# Patient Record
Sex: Male | Born: 1955 | Race: White | Hispanic: No | Marital: Married | Smoking: Never smoker
Health system: Southern US, Community
[De-identification: ages and names within clinical notes are randomized; demographics above are authoritative.]

## PROBLEM LIST (undated history)

## (undated) DIAGNOSIS — R109 Unspecified abdominal pain: Secondary | ICD-10-CM

## (undated) DIAGNOSIS — M25569 Pain in unspecified knee: Secondary | ICD-10-CM

## (undated) DIAGNOSIS — E119 Type 2 diabetes mellitus without complications: Secondary | ICD-10-CM

## (undated) DIAGNOSIS — R079 Chest pain, unspecified: Secondary | ICD-10-CM

## (undated) DIAGNOSIS — Z87448 Personal history of other diseases of urinary system: Secondary | ICD-10-CM

## (undated) DIAGNOSIS — Z87442 Personal history of urinary calculi: Secondary | ICD-10-CM

## (undated) DIAGNOSIS — I10 Essential (primary) hypertension: Secondary | ICD-10-CM

## (undated) DIAGNOSIS — E785 Hyperlipidemia, unspecified: Secondary | ICD-10-CM

## (undated) DIAGNOSIS — M1990 Unspecified osteoarthritis, unspecified site: Secondary | ICD-10-CM

## (undated) DIAGNOSIS — Z86010 Personal history of colonic polyps: Secondary | ICD-10-CM

## (undated) DIAGNOSIS — F329 Major depressive disorder, single episode, unspecified: Secondary | ICD-10-CM

## (undated) DIAGNOSIS — E039 Hypothyroidism, unspecified: Secondary | ICD-10-CM

## (undated) HISTORY — DX: Pain in unspecified knee: M25.569

## (undated) HISTORY — DX: Chest pain, unspecified: R07.9

## (undated) HISTORY — DX: Hyperlipidemia, unspecified: E78.5

## (undated) HISTORY — DX: Personal history of other diseases of urinary system: Z87.448

## (undated) HISTORY — DX: Unspecified osteoarthritis, unspecified site: M19.90

## (undated) HISTORY — PX: OTHER SURGICAL HISTORY: SHX169

## (undated) HISTORY — PX: TONSILLECTOMY: SUR1361

## (undated) HISTORY — PX: ANKLE SURGERY: SHX546

## (undated) HISTORY — DX: Essential (primary) hypertension: I10

## (undated) HISTORY — DX: Personal history of colonic polyps: Z86.010

## (undated) HISTORY — DX: Hypothyroidism, unspecified: E03.9

## (undated) HISTORY — DX: Major depressive disorder, single episode, unspecified: F32.9

## (undated) HISTORY — DX: Personal history of urinary calculi: Z87.442

## (undated) HISTORY — DX: Type 2 diabetes mellitus without complications: E11.9

## (undated) HISTORY — DX: Unspecified abdominal pain: R10.9

## (undated) HISTORY — PX: JOINT REPLACEMENT: SHX530

---

## 2000-08-21 ENCOUNTER — Emergency Department (HOSPITAL_COMMUNITY): Admission: EM | Admit: 2000-08-21 | Discharge: 2000-08-21 | Payer: Self-pay | Admitting: Emergency Medicine

## 2000-08-22 ENCOUNTER — Emergency Department (HOSPITAL_COMMUNITY): Admission: RE | Admit: 2000-08-22 | Discharge: 2000-08-22 | Payer: Self-pay | Admitting: Emergency Medicine

## 2000-08-22 ENCOUNTER — Encounter: Payer: Self-pay | Admitting: Emergency Medicine

## 2000-09-12 ENCOUNTER — Encounter: Payer: Self-pay | Admitting: Urology

## 2000-09-12 ENCOUNTER — Ambulatory Visit (HOSPITAL_COMMUNITY): Admission: RE | Admit: 2000-09-12 | Discharge: 2000-09-12 | Payer: Self-pay | Admitting: Urology

## 2000-11-01 ENCOUNTER — Encounter: Payer: Self-pay | Admitting: Family Medicine

## 2000-11-01 ENCOUNTER — Ambulatory Visit (HOSPITAL_COMMUNITY): Admission: RE | Admit: 2000-11-01 | Discharge: 2000-11-01 | Payer: Self-pay | Admitting: Family Medicine

## 2000-11-18 ENCOUNTER — Ambulatory Visit (HOSPITAL_COMMUNITY): Admission: RE | Admit: 2000-11-18 | Discharge: 2000-11-18 | Payer: Self-pay | Admitting: Urology

## 2000-11-18 ENCOUNTER — Encounter: Payer: Self-pay | Admitting: Urology

## 2001-01-14 ENCOUNTER — Ambulatory Visit (HOSPITAL_COMMUNITY): Admission: RE | Admit: 2001-01-14 | Discharge: 2001-01-14 | Payer: Self-pay | Admitting: Urology

## 2001-01-14 ENCOUNTER — Encounter: Payer: Self-pay | Admitting: Urology

## 2001-11-17 ENCOUNTER — Encounter (INDEPENDENT_AMBULATORY_CARE_PROVIDER_SITE_OTHER): Payer: Self-pay

## 2001-11-17 ENCOUNTER — Ambulatory Visit (HOSPITAL_COMMUNITY): Admission: RE | Admit: 2001-11-17 | Discharge: 2001-11-17 | Payer: Self-pay | Admitting: Gastroenterology

## 2002-12-29 ENCOUNTER — Ambulatory Visit (HOSPITAL_COMMUNITY): Admission: RE | Admit: 2002-12-29 | Discharge: 2002-12-29 | Payer: Self-pay | Admitting: Family Medicine

## 2003-07-06 ENCOUNTER — Encounter: Admission: RE | Admit: 2003-07-06 | Discharge: 2003-10-04 | Payer: Self-pay | Admitting: Family Medicine

## 2004-01-19 ENCOUNTER — Encounter: Admission: RE | Admit: 2004-01-19 | Discharge: 2004-04-18 | Payer: Self-pay | Admitting: Family Medicine

## 2005-01-10 ENCOUNTER — Encounter: Admission: RE | Admit: 2005-01-10 | Discharge: 2005-01-25 | Payer: Self-pay | Admitting: Family Medicine

## 2007-05-27 ENCOUNTER — Encounter: Admission: RE | Admit: 2007-05-27 | Discharge: 2007-05-30 | Payer: Self-pay | Admitting: Family Medicine

## 2008-05-24 ENCOUNTER — Ambulatory Visit: Payer: Self-pay | Admitting: Internal Medicine

## 2008-05-26 ENCOUNTER — Ambulatory Visit: Payer: Self-pay | Admitting: Internal Medicine

## 2008-05-26 DIAGNOSIS — Z860100 Personal history of colon polyps, unspecified: Secondary | ICD-10-CM

## 2008-05-26 DIAGNOSIS — F329 Major depressive disorder, single episode, unspecified: Secondary | ICD-10-CM

## 2008-05-26 DIAGNOSIS — F3289 Other specified depressive episodes: Secondary | ICD-10-CM

## 2008-05-26 DIAGNOSIS — Z87442 Personal history of urinary calculi: Secondary | ICD-10-CM | POA: Insufficient documentation

## 2008-05-26 DIAGNOSIS — E785 Hyperlipidemia, unspecified: Secondary | ICD-10-CM | POA: Insufficient documentation

## 2008-05-26 DIAGNOSIS — I10 Essential (primary) hypertension: Secondary | ICD-10-CM

## 2008-05-26 DIAGNOSIS — E119 Type 2 diabetes mellitus without complications: Secondary | ICD-10-CM

## 2008-05-26 DIAGNOSIS — E039 Hypothyroidism, unspecified: Secondary | ICD-10-CM

## 2008-05-26 DIAGNOSIS — Z87448 Personal history of other diseases of urinary system: Secondary | ICD-10-CM

## 2008-05-26 DIAGNOSIS — E1149 Type 2 diabetes mellitus with other diabetic neurological complication: Secondary | ICD-10-CM | POA: Insufficient documentation

## 2008-05-26 DIAGNOSIS — F325 Major depressive disorder, single episode, in full remission: Secondary | ICD-10-CM | POA: Insufficient documentation

## 2008-05-26 DIAGNOSIS — F32A Depression, unspecified: Secondary | ICD-10-CM | POA: Insufficient documentation

## 2008-05-26 HISTORY — DX: Personal history of other diseases of urinary system: Z87.448

## 2008-05-26 HISTORY — DX: Personal history of colon polyps, unspecified: Z86.0100

## 2008-05-26 HISTORY — DX: Type 2 diabetes mellitus without complications: E11.9

## 2008-05-26 HISTORY — DX: Hypothyroidism, unspecified: E03.9

## 2008-05-26 HISTORY — DX: Hyperlipidemia, unspecified: E78.5

## 2008-05-26 HISTORY — DX: Personal history of colonic polyps: Z86.010

## 2008-05-26 HISTORY — DX: Personal history of urinary calculi: Z87.442

## 2008-05-26 HISTORY — DX: Essential (primary) hypertension: I10

## 2008-05-26 HISTORY — DX: Other specified depressive episodes: F32.89

## 2008-05-26 HISTORY — DX: Major depressive disorder, single episode, unspecified: F32.9

## 2008-05-26 LAB — CONVERTED CEMR LAB
ALT: 32 units/L (ref 0–53)
AST: 26 units/L (ref 0–37)
Albumin: 3.6 g/dL (ref 3.5–5.2)
Alkaline Phosphatase: 43 units/L (ref 39–117)
BUN: 22 mg/dL (ref 6–23)
Basophils Absolute: 0 10*3/uL (ref 0.0–0.1)
Bilirubin Urine: NEGATIVE
CO2: 35 meq/L — ABNORMAL HIGH (ref 19–32)
Calcium: 9.3 mg/dL (ref 8.4–10.5)
Chloride: 104 meq/L (ref 96–112)
Creatinine, Ser: 0.9 mg/dL (ref 0.4–1.5)
Eosinophils Relative: 4.8 % (ref 0.0–5.0)
Glucose, Bld: 149 mg/dL — ABNORMAL HIGH (ref 70–99)
HCT: 43.9 % (ref 39.0–52.0)
HDL: 20 mg/dL — ABNORMAL LOW (ref >39.0)
Hemoglobin: 15 g/dL (ref 13.0–17.0)
Hgb A1c MFr Bld: 6.7 % — ABNORMAL HIGH (ref 4.6–6.0)
LDL Cholesterol: 139 mg/dL — ABNORMAL HIGH (ref 0–99)
MCHC: 34.1 g/dL (ref 30.0–36.0)
MCV: 88.9 fL (ref 78.0–100.0)
Monocytes Absolute: 0.7 10*3/uL (ref 0.1–1.0)
Monocytes Relative: 7.7 % (ref 3.0–12.0)
Platelets: 309 10*3/uL (ref 150–400)
Potassium: 3.7 meq/L (ref 3.5–5.1)
RBC: 4.94 M/uL (ref 4.22–5.81)
Specific Gravity, Urine: 1.02 (ref 1.000–1.03)
TSH: 1.36 microintl units/mL (ref 0.35–5.50)
Total Bilirubin: 0.7 mg/dL (ref 0.3–1.2)
Total CHOL/HDL Ratio: 9.9
Total Protein: 6.9 g/dL (ref 6.0–8.3)
Triglycerides: 193 mg/dL — ABNORMAL HIGH (ref 0–149)
Urine Glucose: NEGATIVE mg/dL
Urobilinogen, UA: 0.2 (ref 0.0–1.0)
WBC: 8.8 10*3/uL (ref 4.5–10.5)

## 2008-05-27 ENCOUNTER — Encounter: Payer: Self-pay | Admitting: Internal Medicine

## 2008-07-01 ENCOUNTER — Ambulatory Visit: Payer: Self-pay | Admitting: Internal Medicine

## 2008-07-01 DIAGNOSIS — M25569 Pain in unspecified knee: Secondary | ICD-10-CM

## 2008-07-01 HISTORY — DX: Pain in unspecified knee: M25.569

## 2008-07-19 ENCOUNTER — Telehealth: Payer: Self-pay | Admitting: Internal Medicine

## 2008-07-20 ENCOUNTER — Ambulatory Visit: Payer: Self-pay | Admitting: Internal Medicine

## 2008-07-20 DIAGNOSIS — R109 Unspecified abdominal pain: Secondary | ICD-10-CM

## 2008-07-20 HISTORY — DX: Unspecified abdominal pain: R10.9

## 2008-07-20 LAB — CONVERTED CEMR LAB
Bilirubin Urine: NEGATIVE
Crystals: NEGATIVE
Hemoglobin, Urine: NEGATIVE
Ketones, ur: NEGATIVE mg/dL
Mucus, UA: NEGATIVE
Specific Gravity, Urine: >=1.03 (ref 1.000–1.03)
Total Protein, Urine: NEGATIVE mg/dL
Urobilinogen, UA: 0.2 (ref 0.0–1.0)

## 2008-07-21 ENCOUNTER — Encounter: Payer: Self-pay | Admitting: Internal Medicine

## 2008-09-23 ENCOUNTER — Encounter: Payer: Self-pay | Admitting: Internal Medicine

## 2009-01-21 ENCOUNTER — Telehealth (INDEPENDENT_AMBULATORY_CARE_PROVIDER_SITE_OTHER): Payer: Self-pay | Admitting: *Deleted

## 2009-01-24 ENCOUNTER — Ambulatory Visit: Payer: Self-pay | Admitting: Internal Medicine

## 2009-02-17 ENCOUNTER — Ambulatory Visit: Payer: Self-pay | Admitting: Internal Medicine

## 2009-02-17 LAB — CONVERTED CEMR LAB
AST: 28 units/L (ref 0–37)
Albumin: 3.9 g/dL (ref 3.5–5.2)
Alkaline Phosphatase: 49 units/L (ref 39–117)
Basophils Absolute: 0 10*3/uL (ref 0.0–0.1)
Basophils Relative: 0.2 % (ref 0.0–3.0)
Bilirubin, Direct: 0.2 mg/dL (ref 0.0–0.3)
Calcium: 9.3 mg/dL (ref 8.4–10.5)
Chloride: 102 meq/L (ref 96–112)
Cholesterol: 172 mg/dL (ref 0–200)
Creatinine,U: 231.1 mg/dL
Eosinophils Relative: 7 % — ABNORMAL HIGH (ref 0.0–5.0)
GFR calc non Af Amer: 93 mL/min
Glucose, Bld: 142 mg/dL — ABNORMAL HIGH (ref 70–99)
HDL: 29.4 mg/dL — ABNORMAL LOW (ref >39.0)
Hemoglobin: 14.8 g/dL (ref 13.0–17.0)
Hgb A1c MFr Bld: 6.8 % — ABNORMAL HIGH (ref 4.6–6.0)
Ketones, ur: NEGATIVE mg/dL
LDL Cholesterol: 118 mg/dL — ABNORMAL HIGH (ref 0–99)
Microalb, Ur: 0.7 mg/dL (ref 0.0–1.9)
Monocytes Relative: 6.7 % (ref 3.0–12.0)
Neutro Abs: 5.7 10*3/uL (ref 1.4–7.7)
Nitrite: NEGATIVE
PSA: 0.1 ng/mL (ref 0.10–4.00)
Potassium: 3.8 meq/L (ref 3.5–5.1)
Sodium: 143 meq/L (ref 135–145)
Total Bilirubin: 0.9 mg/dL (ref 0.3–1.2)
Total Protein, Urine: NEGATIVE mg/dL
Urine Glucose: NEGATIVE mg/dL

## 2009-02-22 ENCOUNTER — Ambulatory Visit: Payer: Self-pay | Admitting: Internal Medicine

## 2009-09-07 ENCOUNTER — Ambulatory Visit: Payer: Self-pay | Admitting: Internal Medicine

## 2009-09-07 LAB — CONVERTED CEMR LAB
BUN: 23 mg/dL (ref 6–23)
CO2: 32 meq/L (ref 19–32)
Calcium: 9.1 mg/dL (ref 8.4–10.5)
Creatinine, Ser: 0.8 mg/dL (ref 0.4–1.5)
HDL: 28.7 mg/dL — ABNORMAL LOW (ref >39.00)
Hgb A1c MFr Bld: 6.5 % (ref 4.6–6.5)
Sodium: 142 meq/L (ref 135–145)
Triglycerides: 164 mg/dL — ABNORMAL HIGH (ref 0.0–149.0)

## 2009-12-28 ENCOUNTER — Inpatient Hospital Stay (HOSPITAL_COMMUNITY): Admission: AD | Admit: 2009-12-28 | Discharge: 2010-01-01 | Payer: Self-pay | Admitting: Orthopedic Surgery

## 2010-01-16 ENCOUNTER — Ambulatory Visit: Payer: Self-pay | Admitting: Internal Medicine

## 2010-01-16 ENCOUNTER — Encounter (INDEPENDENT_AMBULATORY_CARE_PROVIDER_SITE_OTHER): Payer: Self-pay | Admitting: *Deleted

## 2010-01-19 ENCOUNTER — Ambulatory Visit: Payer: Self-pay | Admitting: Internal Medicine

## 2010-01-19 LAB — CONVERTED CEMR LAB

## 2010-04-03 ENCOUNTER — Encounter: Payer: Self-pay | Admitting: Internal Medicine

## 2010-04-27 ENCOUNTER — Ambulatory Visit: Payer: Self-pay | Admitting: Internal Medicine

## 2010-04-27 LAB — CONVERTED CEMR LAB
ALT: 22 units/L (ref 0–53)
Basophils Absolute: 0.1 10*3/uL (ref 0.0–0.1)
Basophils Relative: 0.7 % (ref 0.0–3.0)
CO2: 32 meq/L (ref 19–32)
Chloride: 103 meq/L (ref 96–112)
Cholesterol: 142 mg/dL (ref 0–200)
Creatinine, Ser: 0.8 mg/dL (ref 0.4–1.5)
Eosinophils Absolute: 0.3 10*3/uL (ref 0.0–0.7)
Eosinophils Relative: 3.9 % (ref 0.0–5.0)
Glucose, Bld: 135 mg/dL — ABNORMAL HIGH (ref 70–99)
HCT: 41.4 % (ref 39.0–52.0)
MCHC: 34.2 g/dL (ref 30.0–36.0)
Monocytes Absolute: 0.7 10*3/uL (ref 0.1–1.0)
Neutrophils Relative %: 61.4 % (ref 43.0–77.0)
Platelets: 235 10*3/uL (ref 150.0–400.0)
RDW: 14.1 % (ref 11.5–14.6)
Sodium: 143 meq/L (ref 135–145)
Total Bilirubin: 0.4 mg/dL (ref 0.3–1.2)
VLDL: 25.4 mg/dL (ref 0.0–40.0)

## 2010-11-20 ENCOUNTER — Ambulatory Visit: Payer: Self-pay | Admitting: Internal Medicine

## 2010-11-20 LAB — CONVERTED CEMR LAB
BUN: 26 mg/dL — ABNORMAL HIGH (ref 6–23)
CO2: 30 meq/L (ref 19–32)
Chloride: 102 meq/L (ref 96–112)
HDL: 30.2 mg/dL — ABNORMAL LOW (ref >39.00)
LDL Cholesterol: 79 mg/dL (ref 0–99)
Total CHOL/HDL Ratio: 5
VLDL: 30.6 mg/dL (ref 0.0–40.0)

## 2010-11-23 ENCOUNTER — Ambulatory Visit: Payer: Self-pay | Admitting: Internal Medicine

## 2010-12-13 ENCOUNTER — Encounter: Payer: Self-pay | Admitting: Internal Medicine

## 2010-12-13 LAB — HM COLONOSCOPY: HM Colonoscopy: NORMAL

## 2011-01-23 NOTE — Miscellaneous (Signed)
Summary: Orders Update  Clinical Lists Changes  Problems: Added new problem of COUMADIN THERAPY (ICD-V58.61) Orders: Added new Referral order of Misc. Referral (Misc. Ref) - Signed

## 2011-01-23 NOTE — Medication Information (Signed)
Summary: new to coumadin  Anticoagulant Therapy  Managed by: Charolotte Eke, PharmD Referring MD: Hinda Glatter MD: Graciela Husbands MD, Viviann Spare Indication 1: Prophylaxis DVT (high risk surgery) INR RANGE 2-3  Dietary changes: no    Health status changes: no    Bleeding/hemorrhagic complications: no    Recent/future hospitalizations: no    Any changes in medication regimen? no    Recent/future dental: no  Any missed doses?: no       Is patient compliant with meds? yes       Current Medications (verified): 1)  Allopurinol 300 Mg  Tabs (Allopurinol) .Marland Kitchen.. 1 By Mouth Once Daily 2)  Levothyroxine Sodium 25 Mcg  Tabs (Levothyroxine Sodium) .Marland Kitchen.. 1po Once Daily 3)  Lisinopril 20 Mg Tabs (Lisinopril) .Marland Kitchen.. 1 By Mouth Once Daily 4)  Effexor Xr 75 Mg  Cp24 (Venlafaxine Hcl) .Marland Kitchen.. 1 By Mouth Once Daily 5)  Actoplus Met 15-500 Mg Tabs (Pioglitazone Hcl-Metformin Hcl) .Marland Kitchen.. 1po Once Daily 6)  Klor-Con M20 20 Meq  Tbcr (Potassium Chloride Crys Cr) .... 2 Tabs By Mouth Once Daily 7)  Dicyclomine Hcl 20 Mg  Tabs (Dicyclomine Hcl) .Marland Kitchen.. 1 By Mouth Three Times A Day Prn 8)  Adult Aspirin Ec Low Strength 81 Mg  Tbec (Aspirin) .Marland Kitchen.. 1po Once Daily 9)  Simvastatin 80 Mg Tabs (Simvastatin) .Marland Kitchen.. 1po Once Daily 10)  Atenolol 50 Mg  Tabs (Atenolol) .Marland Kitchen.. 1 By Mouth Once Daily 11)  Chlorthalidone 25 Mg  Tabs (Chlorthalidone) .Marland Kitchen.. 1 By Mouth Once Daily 12)  Tramadol Hcl 50 Mg Tabs (Tramadol Hcl) .Marland Kitchen.. 1 - 2 By Mouth Q 6 Hrs As Needed 13)  Indomethacin 50 Mg Caps (Indomethacin) .Marland Kitchen.. 1 Tab By Mouth Three Times A Day 14)  Doxycycline Hyclate 100 Mg Caps (Doxycycline Hyclate) .Marland Kitchen.. 1 By Mouth Two Times A Day  Allergies (verified): 1)  ! Pcn 2)  ! Sulfa  Anticoagulation Management History:      The patient is taking warfarin and comes in today for a routine follow up visit.  Positive risk factors for bleeding include presence of serious comorbidities.  Negative risk factors for bleeding include an age less than 34  years old.  The bleeding index is 'intermediate risk'.  Positive CHADS2 values include History of HTN and History of Diabetes.  Negative CHADS2 values include Age > 22 years old.  His last INR was 2.0 ratio.  Anticoagulation responsible provider: Graciela Husbands MD, Viviann Spare.  Cuvette Lot#: [id number].  Exp: 03/2011.    Anticoagulation Management Assessment/Plan:      Results were reviewed/authorized by Charolotte Eke, PharmD.  He was notified by Charolotte Eke, PharmD.         Current Anticoagulation Instructions: Continue same dosage: alternate 1 tab with 0.5 tab.  Last dose on 01/21/10.  No follow-up necessary.  Appended Document: new to coumadin INR was 2.0.

## 2011-01-23 NOTE — Assessment & Plan Note (Signed)
Summary: cpx/bcbs/#/cd   Vital Signs:  Patient profile:   55 year old male Height:      73 inches Weight:      324 pounds BMI:     42.90 O2 Sat:      97 % on Room air Temp:     98.4 degrees F oral Pulse rate:   46 / minute BP sitting:   100 / 68  (left arm) Cuff size:   large  Vitals Entered ByZella Ball Ewing (Apr 27, 2010 8:45 AM)  O2 Flow:  Room air  Preventive Care Screening  Last Flu Shot:    Date:  09/23/2009    Results:  given   CC: Adult Physical/RE   CC:  Adult Physical/RE.  History of Present Illness: overall doing well, no complaints,  Pt denies CP, sob, doe, wheezing, orthopnea, pnd, worsening LE edema, palps, dizziness or syncope   Pt denies new neuro symptoms such as headache, facial or extremity weakness   Pt denies polydipsia, polyuria, or low sugar symptoms such as shakiness improved with eating.  Overall good compliance with meds, trying to follow low chol, DM diet, wt stable, little excercise however  CBG's in low 100's.    Problems Prior to Update: 1)  Preventive Health Care  (ICD-V70.0) 2)  Abdominal Pain Other Specified Site  (ICD-789.09) 3)  Knee Pain, Right, Acute  (ICD-719.46) 4)  Nephrolithiasis, Hx of  (ICD-V13.01) 5)  Preventive Health Care  (ICD-V70.0) 6)  Uti's, Hx of  (ICD-V13.00) 7)  Renal Calculus, Hx of  (ICD-V13.01) 8)  Depression  (ICD-311) 9)  Hyperlipidemia  (ICD-272.4) 10)  Hypothyroidism  (ICD-244.9) 11)  Hypertension  (ICD-401.9) 12)  Colonic Polyps, Hx of  (ICD-V12.72) 13)  Diabetes Mellitus, Type II  (ICD-250.00)  Medications Prior to Update: 1)  Allopurinol 300 Mg  Tabs (Allopurinol) .Marland Kitchen.. 1 By Mouth Once Daily 2)  Levothyroxine Sodium 25 Mcg  Tabs (Levothyroxine Sodium) .Marland Kitchen.. 1po Once Daily 3)  Lisinopril 20 Mg Tabs (Lisinopril) .Marland Kitchen.. 1 By Mouth Once Daily 4)  Effexor Xr 75 Mg  Cp24 (Venlafaxine Hcl) .Marland Kitchen.. 1 By Mouth Once Daily 5)  Actoplus Met 15-500 Mg Tabs (Pioglitazone Hcl-Metformin Hcl) .Marland Kitchen.. 1po Once Daily 6)  Klor-Con  M20 20 Meq  Tbcr (Potassium Chloride Crys Cr) .... 2 Tabs By Mouth Once Daily 7)  Dicyclomine Hcl 20 Mg  Tabs (Dicyclomine Hcl) .Marland Kitchen.. 1 By Mouth Three Times A Day Prn 8)  Adult Aspirin Ec Low Strength 81 Mg  Tbec (Aspirin) .Marland Kitchen.. 1po Once Daily 9)  Simvastatin 80 Mg Tabs (Simvastatin) .Marland Kitchen.. 1po Once Daily 10)  Atenolol 50 Mg  Tabs (Atenolol) .Marland Kitchen.. 1 By Mouth Once Daily 11)  Chlorthalidone 25 Mg  Tabs (Chlorthalidone) .Marland Kitchen.. 1 By Mouth Once Daily 12)  Tramadol Hcl 50 Mg Tabs (Tramadol Hcl) .Marland Kitchen.. 1 - 2 By Mouth Q 6 Hrs As Needed 13)  Indomethacin 50 Mg Caps (Indomethacin) .Marland Kitchen.. 1 Tab By Mouth Three Times A Day 14)  Doxycycline Hyclate 100 Mg Caps (Doxycycline Hyclate) .Marland Kitchen.. 1 By Mouth Two Times A Day  Current Medications (verified): 1)  Allopurinol 300 Mg  Tabs (Allopurinol) .Marland Kitchen.. 1 By Mouth Once Daily 2)  Levothyroxine Sodium 25 Mcg  Tabs (Levothyroxine Sodium) .Marland Kitchen.. 1po Once Daily 3)  Lisinopril 20 Mg Tabs (Lisinopril) .Marland Kitchen.. 1 By Mouth Once Daily 4)  Effexor Xr 75 Mg  Cp24 (Venlafaxine Hcl) .Marland Kitchen.. 1 By Mouth Once Daily 5)  Actoplus Met 15-500 Mg Tabs (Pioglitazone Hcl-Metformin Hcl) .Marland Kitchen.. 1po  Once Daily 6)  Klor-Con M20 20 Meq  Tbcr (Potassium Chloride Crys Cr) .... 2 Tabs By Mouth Once Daily 7)  Dicyclomine Hcl 20 Mg  Tabs (Dicyclomine Hcl) .Marland Kitchen.. 1 By Mouth Three Times A Day As Needed 8)  Adult Aspirin Ec Low Strength 81 Mg  Tbec (Aspirin) .Marland Kitchen.. 1po Once Daily 9)  Simvastatin 80 Mg Tabs (Simvastatin) .Marland Kitchen.. 1po Once Daily 10)  Atenolol 50 Mg  Tabs (Atenolol) .Marland Kitchen.. 1 By Mouth Once Daily 11)  Chlorthalidone 25 Mg  Tabs (Chlorthalidone) .Marland Kitchen.. 1 By Mouth Once Daily 12)  Indomethacin 50 Mg Caps (Indomethacin) .Marland Kitchen.. 1 Tab By Mouth Three Times A Day  Allergies (verified): 1)  ! Pcn 2)  ! 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 Diabetes mellitus, type II Colonic polyps, hx of - 1997 - adenomatous - dr Randa Evens Hypertension Hypothyroidism Hyperlipidemia Depression Nephrolithiasis, hx of add.prob Right knee pain  Past Surgical History: Tonsillectomy s/p right knee arthroplasty complicated by patellar tendon rupture ja n 2011 - dr Darrelyn Hillock  Review of Systems  The patient denies anorexia, fever, vision loss, decreased hearing, hoarseness, chest pain, syncope, dyspnea on exertion, peripheral edema, prolonged cough, headaches, hemoptysis, abdominal pain, melena, hematochezia, severe indigestion/heartburn, hematuria, muscle weakness, suspicious skin lesions, transient blindness, difficulty walking, depression, unusual weight change, abnormal bleeding, enlarged lymph nodes, and angioedema.         all otherwise negative per pt -    Physical Exam  General:  alert and overweight-appearing.   Head:  normocephalic and atraumatic.   Eyes:  vision grossly intact, pupils equal, and pupils round.   Ears:  R ear normal and L ear normal.   Nose:  no external deformity and no nasal discharge.   Mouth:  no gingival abnormalities and pharynx pink and moist.   Neck:  supple and no masses.   Lungs:  normal respiratory effort and normal breath sounds.   Heart:  normal rate and regular rhythm.   Abdomen:  soft, non-tender, and normal bowel sounds.   Msk:  no joint tenderness and no joint swelling.   Extremities:  no edema, no erythema  Neurologic:  cranial nerves II-XII intact and strength normal in all extremities.   Skin:  color normal and no rashes.   Psych:  not anxious appearing and not depressed appearing.     Impression & Recommendations:  Problem # 1:  Preventive Health Care (ICD-V70.0)  Overall doing well, age  appropriate education and counseling updated and referral for appropriate preventive services done unless declined, immunizations up to date or declined, diet counseling done if overweight, urged to quit smoking if smokes , most recent labs reviewed and current ordered if appropriate, ecg reviewed or declined (interpretation per ECG scanned in the EMR if done); information regarding Medicare Prevention requirements given if appropriate; speciality referrals updated as appropriate   Orders: EKG w/ Interpretation (93000)  Problem # 2:  COLONIC POLYPS, HX OF (ICD-V12.72)  refer Dr Ewing Schlein for f/u colonoscopy  Orders: Gastroenterology Referral (GI)  Problem # 3:  DIABETES MELLITUS, TYPE II (ICD-250.00)  His updated medication list for this problem includes:    Lisinopril 20 Mg Tabs (Lisinopril) .Marland Kitchen... 1 by mouth  once daily    Actoplus Met 15-500 Mg Tabs (Pioglitazone hcl-metformin hcl) .Marland Kitchen... 1po once daily    Adult Aspirin Ec Low Strength 81 Mg Tbec (Aspirin) .Marland Kitchen... 1po once daily  Labs Reviewed: Creat: 0.8 (09/07/2009)    Reviewed HgBA1c results: 6.5 (09/07/2009)  6.8 (02/17/2009) stable overall by hx and exam, ok to continue meds/tx as is   Problem # 4:  HYPERTENSION (ICD-401.9)  His updated medication list for this problem includes:    Lisinopril 20 Mg Tabs (Lisinopril) .Marland Kitchen... 1 by mouth once daily    Atenolol 50 Mg Tabs (Atenolol) .Marland Kitchen... 1 by mouth once daily    Chlorthalidone 25 Mg Tabs (Chlorthalidone) .Marland Kitchen... 1 by mouth once daily  BP today: 100/68 Prior BP: 130/76 (09/07/2009)  Labs Reviewed: K+: 3.5 (09/07/2009) Creat: : 0.8 (09/07/2009)   Chol: 146 (09/07/2009)   HDL: 28.70 (09/07/2009)   LDL: 85 (09/07/2009)   TG: 164.0 (09/07/2009) stable overall by hx and exam, ok to continue meds/tx as is   Complete Medication List: 1)  Allopurinol 300 Mg Tabs (Allopurinol) .Marland Kitchen.. 1 by mouth once daily 2)  Levothyroxine Sodium 25 Mcg Tabs (Levothyroxine sodium) .Marland Kitchen.. 1po once daily 3)   Lisinopril 20 Mg Tabs (Lisinopril) .Marland Kitchen.. 1 by mouth once daily 4)  Effexor Xr 75 Mg Cp24 (Venlafaxine hcl) .Marland Kitchen.. 1 by mouth once daily 5)  Actoplus Met 15-500 Mg Tabs (Pioglitazone hcl-metformin hcl) .Marland Kitchen.. 1po once daily 6)  Klor-con M20 20 Meq Tbcr (Potassium chloride crys cr) .... 2 tabs by mouth once daily 7)  Dicyclomine Hcl 20 Mg Tabs (Dicyclomine hcl) .Marland Kitchen.. 1 by mouth three times a day as needed 8)  Adult Aspirin Ec Low Strength 81 Mg Tbec (Aspirin) .Marland Kitchen.. 1po once daily 9)  Simvastatin 80 Mg Tabs (Simvastatin) .Marland Kitchen.. 1po once daily 10)  Atenolol 50 Mg Tabs (Atenolol) .Marland Kitchen.. 1 by mouth once daily 11)  Chlorthalidone 25 Mg Tabs (Chlorthalidone) .Marland Kitchen.. 1 by mouth once daily 12)  Indomethacin 50 Mg Caps (Indomethacin) .Marland Kitchen.. 1 tab by mouth three times a day  Patient Instructions: 1)  Please go to the Lab in the basement for your blood and/or urine tests today 2)  Continue all previous medications as before this visit  3)  You will be contacted about the referral(s) to: Dr Magod/GI 4)  Please schedule a follow-up appointment in 6 months with: 5)  BMP prior to visit, ICD-9: 250.02 6)  Lipid Panel prior to visit, ICD-9: 7)  HbgA1C prior to visit, ICD-9: Prescriptions: INDOMETHACIN 50 MG CAPS (INDOMETHACIN) 1 tab by mouth three times a day  #90 x 3   Entered and Authorized by:   Corwin Levins MD   Signed by:   Corwin Levins MD on 04/27/2010   Method used:   Print then Give to Patient   RxID:   [id number] CHLORTHALIDONE 25 MG  TABS (CHLORTHALIDONE) 1 by mouth once daily  #90 x 3   Entered and Authorized by:   Corwin Levins MD   Signed by:   Corwin Levins MD on 04/27/2010   Method used:   Print then Give to Patient   RxID:   [id number] ATENOLOL 50 MG  TABS (ATENOLOL) 1 by mouth once daily  #90 x 3   Entered and Authorized by:   Corwin Levins MD   Signed by:   Corwin Levins MD on 04/27/2010   Method used:   Print then Give to Patient   RxID:   [id number] SIMVASTATIN 80  MG TABS  (SIMVASTATIN) 1po once daily  #90 x 3   Entered and Authorized by:   Corwin Levins MD   Signed by:   Corwin Levins MD on 04/27/2010   Method used:   Print then Give to Patient   RxID:   [id number] DICYCLOMINE HCL 20 MG  TABS (DICYCLOMINE HCL) 1 by mouth three times a day as needed  #180 x 3   Entered and Authorized by:   Corwin Levins MD   Signed by:   Corwin Levins MD on 04/27/2010   Method used:   Print then Give to Patient   RxID:   [id number] KLOR-CON M20 20 MEQ  TBCR (POTASSIUM CHLORIDE CRYS CR) 2 tabs by mouth once daily  #180 x 3   Entered and Authorized by:   Corwin Levins MD   Signed by:   Corwin Levins MD on 04/27/2010   Method used:   Print then Give to Patient   RxID:   [id number] ACTOPLUS MET 15-500 MG TABS (PIOGLITAZONE HCL-METFORMIN HCL) 1po once daily  #90 x 3   Entered and Authorized by:   Corwin Levins MD   Signed by:   Corwin Levins MD on 04/27/2010   Method used:   Print then Give to Patient   RxID:   [id number] EFFEXOR XR 75 MG  CP24 (VENLAFAXINE HCL) 1 by mouth once daily  #90 x 3   Entered and Authorized by:   Corwin Levins MD   Signed by:   Corwin Levins MD on 04/27/2010   Method used:   Print then Give to Patient   RxID:   [id number] LISINOPRIL 20 MG TABS (LISINOPRIL) 1 by mouth once daily  #90 x 3   Entered and Authorized by:   Corwin Levins MD   Signed by:   Corwin Levins MD on 04/27/2010   Method used:   Print then Give to Patient   RxID:   [id number] LEVOTHYROXINE SODIUM 25 MCG  TABS (LEVOTHYROXINE SODIUM) 1po once daily  #90 x 3   Entered and Authorized by:   Corwin Levins MD   Signed by:   Corwin Levins MD on 04/27/2010   Method used:   Print then Give to Patient   RxID:   [id number] ALLOPURINOL 300 MG  TABS (ALLOPURINOL) 1 by mouth once daily  #90 x 3   Entered and Authorized by:   Corwin Levins MD   Signed by:   Corwin Levins MD on 04/27/2010   Method used:   Print then Give to Patient   RxID:   [id number]

## 2011-01-23 NOTE — Letter (Signed)
Summary: Diabetes Evaluation Report/Dr. Elliot Cousin   Diabetes Evaluation Report/Dr. Elliot Cousin   Imported By: Esmeralda Links D'jimraou 04/08/2010 12:01:37  _____________________________________________________________________  External Attachment:    Type:   Image     Comment:   External Document

## 2011-01-23 NOTE — Assessment & Plan Note (Signed)
Summary: 6 mos f/u / # / cd   Vital Signs:  Patient profile:   55 year old male Height:      73 inches Weight:      334.25 pounds BMI:     44.26 O2 Sat:      97 % on Room air Temp:     98 degrees F oral Pulse rate:   54 / minute BP sitting:   100 / 68  (left arm) Cuff size:   large  Vitals Entered By: Zella Ball Ewing CMA (AAMA) (November 23, 2010 8:16 AM)  O2 Flow:  Room air CC: 6 month ROV/RE   CC:  6 month ROV/RE.  History of Present Illness: here for f/u - Pt denies CP, worsening sob, doe, wheezing, orthopnea, pnd, worsening LE edema, palps,  or syncope  Pt denies new neuro symptoms such as headache, facial or extremity weakness  Pt denies polydipsia, polyuria, or low sugar symptoms such as shakiness improved with eating.  Overall good compliance with meds, trying to follow low chol, DM diet, wt stable, little excercise however  CBg's at home in the low 100's.  Wt down overall from 347 in sept 2011.  Still with high stress sedentary job.  Right knee s/p TKR jan 2011, with post op complicated but vigorous PT and partial patellar tendon rupture, still with some trouble getting strength back, follow per Dr Darrelyn Hillock.  Does feel sluggish but not sure if it is the wt and lack of excericse, has some occasional dizziness but functionally limiting. Denies worsening depressive symptoms, suicidal ideation, or panic.    Preventive Screening-Counseling & Management      Drug Use:  no.    Problems Prior to Update: 1)  Preventive Health Care  (ICD-V70.0) 2)  Abdominal Pain Other Specified Site  (ICD-789.09) 3)  Knee Pain, Right, Acute  (ICD-719.46) 4)  Nephrolithiasis, Hx of  (ICD-V13.01) 5)  Preventive Health Care  (ICD-V70.0) 6)  Uti's, Hx of  (ICD-V13.00) 7)  Renal Calculus, Hx of  (ICD-V13.01) 8)  Depression  (ICD-311) 9)  Hyperlipidemia  (ICD-272.4) 10)  Hypothyroidism  (ICD-244.9) 11)  Hypertension  (ICD-401.9) 12)  Colonic Polyps, Hx of  (ICD-V12.72) 13)  Diabetes Mellitus, Type II   (ICD-250.00)  Medications Prior to Update: 1)  Allopurinol 300 Mg  Tabs (Allopurinol) .Marland Kitchen.. 1 By Mouth Once Daily 2)  Levothyroxine Sodium 25 Mcg  Tabs (Levothyroxine Sodium) .Marland Kitchen.. 1po Once Daily 3)  Lisinopril 20 Mg Tabs (Lisinopril) .Marland Kitchen.. 1 By Mouth Once Daily 4)  Effexor Xr 75 Mg  Cp24 (Venlafaxine Hcl) .Marland Kitchen.. 1 By Mouth Once Daily 5)  Actoplus Met 15-500 Mg Tabs (Pioglitazone Hcl-Metformin Hcl) .Marland Kitchen.. 1po Once Daily 6)  Klor-Con M20 20 Meq  Tbcr (Potassium Chloride Crys Cr) .... 2 Tabs By Mouth Once Daily 7)  Dicyclomine Hcl 20 Mg  Tabs (Dicyclomine Hcl) .Marland Kitchen.. 1 By Mouth Three Times A Day As Needed 8)  Adult Aspirin Ec Low Strength 81 Mg  Tbec (Aspirin) .Marland Kitchen.. 1po Once Daily 9)  Simvastatin 80 Mg Tabs (Simvastatin) .Marland Kitchen.. 1po Once Daily 10)  Atenolol-Chlorthalidone 50-25 Mg Tabs (Atenolol-Chlorthalidone) .Marland Kitchen.. 1 By Mouth Once Daily 11)  Chlorthalidone 25 Mg  Tabs (Chlorthalidone) .Marland Kitchen.. 1 By Mouth Once Daily 12)  Indomethacin 50 Mg Caps (Indomethacin) .Marland Kitchen.. 1 Tab By Mouth Three Times A Day  Current Medications (verified): 1)  Allopurinol 300 Mg  Tabs (Allopurinol) .Marland Kitchen.. 1 By Mouth Once Daily 2)  Levothyroxine Sodium 25 Mcg  Tabs (Levothyroxine Sodium) .Marland KitchenMarland KitchenMarland Kitchen  1po Once Daily 3)  Lisinopril 20 Mg Tabs (Lisinopril) .Marland Kitchen.. 1 By Mouth Once Daily 4)  Effexor Xr 75 Mg  Cp24 (Venlafaxine Hcl) .Marland Kitchen.. 1 By Mouth Once Daily 5)  Actoplus Met 15-500 Mg Tabs (Pioglitazone Hcl-Metformin Hcl) .Marland Kitchen.. 1po Once Daily 6)  Klor-Con M20 20 Meq  Tbcr (Potassium Chloride Crys Cr) .... 2 Tabs By Mouth Once Daily 7)  Dicyclomine Hcl 20 Mg  Tabs (Dicyclomine Hcl) .Marland Kitchen.. 1 By Mouth Three Times A Day As Needed 8)  Adult Aspirin Ec Low Strength 81 Mg  Tbec (Aspirin) .Marland Kitchen.. 1po Once Daily 9)  Simvastatin 80 Mg Tabs (Simvastatin) .Marland Kitchen.. 1po Once Daily 10)  Atenolol-Chlorthalidone 50-25 Mg Tabs (Atenolol-Chlorthalidone) .... 1/2 By Mouth Once Daily 11)  Indomethacin 50 Mg Caps (Indomethacin) .Marland Kitchen.. 1 Tab By Mouth Three Times A Day  Allergies  (verified): 1)  ! Pcn 2)  ! 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   Head:  normocephalic and atraumatic.   Eyes:  vision grossly intact, pupils equal, and pupils round.   Ears:  R ear normal and L ear normal.   Nose:  no external deformity and no nasal discharge.   Mouth:  no gingival abnormalities and pharynx pink and moist.   Neck:  supple and no masses.   Lungs:  normal respiratory effort and normal breath sounds.   Heart:  normal rate and regular rhythm.   Extremities:  no edema, no erythema    Impression & Recommendations:  Problem # 1:  HYPERTENSION (ICD-401.9)  The following medications were removed from the medication list:    Chlorthalidone 25 Mg Tabs (Chlorthalidone) .Marland Kitchen... 1 by mouth once daily His updated medication list for this problem includes:    Lisinopril 20 Mg Tabs (Lisinopril) .Marland Kitchen... 1 by mouth once daily    Atenolol-chlorthalidone 50-25 Mg Tabs (Atenolol-chlorthalidone) .Marland Kitchen... 1/2 by mouth once daily  BP today: 100/68 Prior BP: 100/68 (04/27/2010)  Labs Reviewed: K+: 3.5 (11/20/2010) Creat: : 0.9 (11/20/2010)   Chol: 140 (11/20/2010)   HDL: 30.20 (11/20/2010)    LDL: 79 (11/20/2010)   TG: 153.0 (11/20/2010) ? overcontrolled - to take HALF of the atenolol-chlorthalidone for now, follow symtpoms,  f/u BP at home and next visit  Problem # 2:  HYPERLIPIDEMIA (ICD-272.4)  His updated medication list for this problem includes:    Simvastatin 80 Mg Tabs (Simvastatin) .Marland Kitchen... 1po once daily  Labs Reviewed: SGOT: 23 (04/27/2010)   SGPT: 22 (04/27/2010)   HDL:30.20 (11/20/2010), 29.50 (04/27/2010)  LDL:79 (11/20/2010), 87 (04/27/2010)  Chol:140 (11/20/2010), 142 (04/27/2010)  Trig:153.0 (11/20/2010), 127.0 (04/27/2010) stable overall by hx and exam, ok to continue meds/tx as is   Problem # 3:  DIABETES MELLITUS, TYPE II (ICD-250.00)  His updated medication list for this problem includes:  Lisinopril 20 Mg Tabs (Lisinopril) .Marland Kitchen... 1 by mouth once daily    Actoplus Met 15-500 Mg Tabs (Pioglitazone hcl-metformin hcl) .Marland Kitchen... 1po once daily    Adult Aspirin Ec Low Strength 81 Mg Tbec (Aspirin) .Marland Kitchen... 1po once daily  Labs Reviewed: Creat: 0.9 (11/20/2010)    Reviewed HgBA1c results: 6.4 (11/20/2010)  6.4 (04/27/2010) stable overall by hx and exam, ok to continue meds/tx as is   Problem # 4:  DEPRESSION (ICD-311)  His updated medication list for this problem includes:    Effexor Xr 75 Mg Cp24 (Venlafaxine hcl) .Marland Kitchen... 1 by mouth once daily stable overall by hx and exam, ok to continue meds/tx as is    Complete Medication List: 1)  Allopurinol 300 Mg Tabs (Allopurinol) .Marland Kitchen.. 1 by mouth once daily 2)  Levothyroxine Sodium 25 Mcg Tabs (Levothyroxine sodium) .Marland Kitchen.. 1po once daily 3)  Lisinopril 20 Mg Tabs (Lisinopril) .Marland Kitchen.. 1 by mouth once daily 4)  Effexor Xr 75 Mg Cp24 (Venlafaxine hcl) .Marland Kitchen.. 1 by mouth once daily 5)  Actoplus Met 15-500 Mg Tabs (Pioglitazone hcl-metformin hcl) .Marland Kitchen.. 1po once daily 6)  Klor-con M20 20 Meq Tbcr (Potassium chloride crys cr) .... 2 tabs by mouth once daily 7)  Dicyclomine Hcl 20 Mg Tabs (Dicyclomine hcl) .Marland Kitchen.. 1 by mouth three times a  day as needed 8)  Adult Aspirin Ec Low Strength 81 Mg Tbec (Aspirin) .Marland Kitchen.. 1po once daily 9)  Simvastatin 80 Mg Tabs (Simvastatin) .Marland Kitchen.. 1po once daily 10)  Atenolol-chlorthalidone 50-25 Mg Tabs (Atenolol-chlorthalidone) .... 1/2 by mouth once daily 11)  Indomethacin 50 Mg Caps (Indomethacin) .Marland Kitchen.. 1 tab by mouth three times a day  Patient Instructions: 1)  please take HALF of the atenolol-chlorthalidone 2)  Continue all previous medications as before this visit 3)  Please schedule a follow-up appointment in 6 months for CPX with labs and: 4)  HbgA1C prior to visit, ICD-9: 250.02 5)  Urine Microalbumin prior to visit, ICD-9: 6)  Check your Blood Pressure regularly. If it is above 130/90: you should make an appointment or call, to consider increasing the BP med back to whole pill per day   Orders Added: 1)  Est. Patient Level IV [04540]

## 2011-01-25 NOTE — Procedures (Signed)
Summary: Colonoscopy / [facility]  Colonoscopy / [facility]   Imported By: Lennie Odor 12/22/2010 17:09:11  _____________________________________________________________________  External Attachment:    Type:   Image     Comment:   External Document

## 2011-01-30 ENCOUNTER — Telehealth: Payer: Self-pay | Admitting: Internal Medicine

## 2011-02-06 ENCOUNTER — Encounter: Payer: Self-pay | Admitting: Internal Medicine

## 2011-02-06 ENCOUNTER — Ambulatory Visit (INDEPENDENT_AMBULATORY_CARE_PROVIDER_SITE_OTHER)
Admission: RE | Admit: 2011-02-06 | Discharge: 2011-02-06 | Disposition: A | Discharge status: Home / Self Care | Payer: BC Managed Care – PPO | Source: Ambulatory Visit | Attending: Internal Medicine | Admitting: Internal Medicine

## 2011-02-06 ENCOUNTER — Ambulatory Visit (INDEPENDENT_AMBULATORY_CARE_PROVIDER_SITE_OTHER): Payer: BC Managed Care – PPO | Admitting: Internal Medicine

## 2011-02-06 ENCOUNTER — Other Ambulatory Visit: Payer: Self-pay | Admitting: Internal Medicine

## 2011-02-06 DIAGNOSIS — F329 Major depressive disorder, single episode, unspecified: Secondary | ICD-10-CM

## 2011-02-06 DIAGNOSIS — R079 Chest pain, unspecified: Secondary | ICD-10-CM

## 2011-02-06 DIAGNOSIS — I10 Essential (primary) hypertension: Secondary | ICD-10-CM

## 2011-02-06 DIAGNOSIS — M25569 Pain in unspecified knee: Secondary | ICD-10-CM

## 2011-02-06 HISTORY — DX: Chest pain, unspecified: R07.9

## 2011-02-08 NOTE — Progress Notes (Signed)
  Phone Note From Other Clinic   Caller: Fonda angel/ cone denium@ [phone] Summary of Call: Pt has been having episode of cramping/hand numbness in both hands. No SOB. BP 120/80.  Initial call taken by: Orlan Leavens RMA,  January 30, 2011 3:55 PM  Follow-up for Phone Call        Tried to call nurse back no ansew x's 10 rings Follow-up by: Orlan Leavens RMA,  January 30, 2011 3:58 PM  Additional Follow-up for Phone Call Additional follow up Details #1::        no answer, no VM x several attempts. Closing phone note until further contact from Nurse or pt Additional Follow-up by: Margaret Pyle, CMA,  February 01, 2011 9:40 AM

## 2011-02-14 NOTE — Assessment & Plan Note (Signed)
Summary: fell last wed. ?bruised right rib/lb   Vital Signs:  Patient profile:   55 year old male Height:      73 inches Weight:      336.50 pounds BMI:     44.56 O2 Sat:      96 % on Room air Temp:     98.8 degrees F oral Pulse rate:   54 / minute BP sitting:   112 / 72  (left arm) Cuff size:   large  Vitals Entered By: Zella Ball Ewing CMA Duncan Dull) (February 06, 2011 8:11 AM)  O2 Flow:  Room air  Preventive Care Screening  Colonoscopy:    Date:  12/13/2010    Next Due:  12/2015    Results:  normal   CC: Fell 1 week ago, right side pain/RE   CC:  Fell 1 week ago and right side pain/RE.  History of Present Illness: here to f/u - 6 days ago fell after tripped and struck the right elbow/arm that seemed to jam hard into the right side, tyring to avoid falling directly on the right knee he already had surgury on;  still with pain to the right side and increased it seems , pain now moderate, constant, sore to touch, worse pain to cough and  deep breaths but not SOB/DOE.  Pt denies other CP, worsening sob, doe, wheezing, orthopnea, pnd, worsening LE edema, palps, dizziness or syncope.  Pt denies new neuro symptoms such as headache, facial or extremity weakness  Pt denies polydipsia, polyuria. No fever, wt loss, night sweats, loss of appetite or other constitutional symptoms ., no ST or cough.  Right knee pain only mild and essentially resolving, without sweling or bruising as he tried not to hit the right knee.  Gait ok, no swelling or giveaway or unstable.   Denies worsening depressive symptoms, suicidal ideation, or panic.   Problems Prior to Update: 1)  Chest Pain  (ICD-786.50) 2)  Preventive Health Care  (ICD-V70.0) 3)  Abdominal Pain Other Specified Site  (ICD-789.09) 4)  Knee Pain, Right, Acute  (ICD-719.46) 5)  Nephrolithiasis, Hx of  (ICD-V13.01) 6)  Preventive Health Care  (ICD-V70.0) 7)  Uti's, Hx of  (ICD-V13.00) 8)  Renal Calculus, Hx of  (ICD-V13.01) 9)  Depression   (ICD-311) 10)  Hyperlipidemia  (ICD-272.4) 11)  Hypothyroidism  (ICD-244.9) 12)  Hypertension  (ICD-401.9) 13)  Colonic Polyps, Hx of  (ICD-V12.72) 14)  Diabetes Mellitus, Type II  (ICD-250.00)  Medications Prior to Update: 1)  Allopurinol 300 Mg  Tabs (Allopurinol) .Marland Kitchen.. 1 By Mouth Once Daily 2)  Levothyroxine Sodium 25 Mcg  Tabs (Levothyroxine Sodium) .Marland Kitchen.. 1po Once Daily 3)  Lisinopril 20 Mg Tabs (Lisinopril) .Marland Kitchen.. 1 By Mouth Once Daily 4)  Effexor Xr 75 Mg  Cp24 (Venlafaxine Hcl) .Marland Kitchen.. 1 By Mouth Once Daily 5)  Actoplus Met 15-500 Mg Tabs (Pioglitazone Hcl-Metformin Hcl) .Marland Kitchen.. 1po Once Daily 6)  Klor-Con M20 20 Meq  Tbcr (Potassium Chloride Crys Cr) .... 2 Tabs By Mouth Once Daily 7)  Dicyclomine Hcl 20 Mg  Tabs (Dicyclomine Hcl) .Marland Kitchen.. 1 By Mouth Three Times A Day As Needed 8)  Adult Aspirin Ec Low Strength 81 Mg  Tbec (Aspirin) .Marland Kitchen.. 1po Once Daily 9)  Simvastatin 80 Mg Tabs (Simvastatin) .Marland Kitchen.. 1po Once Daily 10)  Atenolol-Chlorthalidone 50-25 Mg Tabs (Atenolol-Chlorthalidone) .... 1/2 By Mouth Once Daily 11)  Indomethacin 50 Mg Caps (Indomethacin) .Marland Kitchen.. 1 Tab By Mouth Three Times A Day  Current Medications (verified): 1)  Allopurinol  300 Mg  Tabs (Allopurinol) .Marland Kitchen.. 1 By Mouth Once Daily 2)  Levothyroxine Sodium 25 Mcg  Tabs (Levothyroxine Sodium) .Marland Kitchen.. 1po Once Daily 3)  Lisinopril 20 Mg Tabs (Lisinopril) .Marland Kitchen.. 1 By Mouth Once Daily 4)  Effexor Xr 75 Mg  Cp24 (Venlafaxine Hcl) .Marland Kitchen.. 1 By Mouth Once Daily 5)  Actoplus Met 15-500 Mg Tabs (Pioglitazone Hcl-Metformin Hcl) .Marland Kitchen.. 1po Once Daily 6)  Klor-Con M20 20 Meq  Tbcr (Potassium Chloride Crys Cr) .... 2 Tabs By Mouth Once Daily 7)  Dicyclomine Hcl 20 Mg  Tabs (Dicyclomine Hcl) .Marland Kitchen.. 1 By Mouth Three Times A Day As Needed 8)  Adult Aspirin Ec Low Strength 81 Mg  Tbec (Aspirin) .Marland Kitchen.. 1po Once Daily 9)  Simvastatin 80 Mg Tabs (Simvastatin) .Marland Kitchen.. 1po Once Daily 10)  Atenolol-Chlorthalidone 50-25 Mg Tabs (Atenolol-Chlorthalidone) .... 1/2 By Mouth  Once Daily 11)  Indomethacin 50 Mg Caps (Indomethacin) .Marland Kitchen.. 1 Tab By Mouth Three Times A Day  Allergies (verified): 1)  ! Pcn 2)  ! 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   Head:  normocephalic and atraumatic.   Eyes:  vision grossly intact, pupils equal, and pupils round.   Ears:  R ear normal and L ear normal.   Nose:  no external deformity and no nasal discharge.   Mouth:  no gingival abnormalities and pharynx pink and moist.   Neck:  supple and no masses.   Lungs:  normal respiratory effort and normal breath sounds.   Heart:  normal rate and regular rhythm.   Abdomen:  soft, non-tender, and normal bowel sounds.   Msk:  mod to marked tender to the right lateral chest wall approx t7 in the anterior axillary line, no bruise/swelling/abrasion Extremities:  no edema, no erythema  Skin:  color normal and no rashes.   Psych:  not depressed appearing and moderately anxious.     Impression & Recommendations:  Problem # 1:  CHEST PAIN (ICD-786.50) atypical - suscept deeper soft tissue bruise, will check film to r/o fx Orders: T-2 View CXR, Same Day (71020.5TC) T-Ribs Unilateral 2 Views (71100TC)  Problem # 2:  KNEE PAIN, RIGHT, ACUTE (ICD-719.46)  His updated medication list for this problem includes:     Adult Aspirin Ec Low Strength 81 Mg Tbec (Aspirin) .Marland Kitchen... 1po once daily    Indomethacin 50 Mg Caps (Indomethacin) .Marland Kitchen... 1 tab by mouth three times a day mild on fall , but exam benign, ok to follow,  reassured, no films needed at this time, treat as above, f/u any worsening signs or symptoms   Problem # 3:  HYPERTENSION (ICD-401.9)  His updated medication list for this problem includes:    Lisinopril 20 Mg Tabs (Lisinopril) .Marland Kitchen... 1 by mouth once daily    Atenolol-chlorthalidone 50-25 Mg Tabs (Atenolol-chlorthalidone) .Marland Kitchen... 1/2 by mouth once daily  BP today: 112/72 Prior BP: 100/68 (11/23/2010)  Labs  Reviewed: K+: 3.5 (11/20/2010) Creat: : 0.9 (11/20/2010)   Chol: 140 (11/20/2010)   HDL: 30.20 (11/20/2010)   LDL: 79 (11/20/2010)   TG: 153.0 (11/20/2010) stable overall by hx and exam, ok to continue meds/tx as is   Problem # 4:  DEPRESSION (ICD-311)  His updated medication list for this problem includes:    Effexor Xr 75 Mg Cp24 (Venlafaxine hcl) .Marland Kitchen... 1 by mouth once daily  Discussed treatment options, Patient agrees to call if any worsening of symptoms or thoughts of doing harm arise. Verified that the patient has no suicidal ideation at this time.   stable overall by hx and exam, ok to continue meds/tx as is   Complete Medication List: 1)  Allopurinol 300 Mg Tabs (Allopurinol) .Marland Kitchen.. 1 by mouth once daily 2)  Levothyroxine Sodium 25 Mcg Tabs (Levothyroxine sodium) .Marland Kitchen.. 1po once daily 3)  Lisinopril 20 Mg Tabs (Lisinopril) .Marland Kitchen.. 1 by mouth once daily 4)  Effexor Xr 75 Mg Cp24 (Venlafaxine hcl) .Marland Kitchen.. 1 by mouth once daily 5)  Actoplus Met 15-500 Mg Tabs (Pioglitazone hcl-metformin hcl) .Marland Kitchen.. 1po once daily 6)  Klor-con M20 20 Meq Tbcr (Potassium chloride crys cr) .... 2 tabs by mouth once daily 7)  Dicyclomine Hcl 20 Mg Tabs (Dicyclomine hcl) .Marland Kitchen.. 1 by mouth three times a day as needed 8)  Adult Aspirin Ec Low Strength 81 Mg Tbec (Aspirin) .Marland Kitchen.. 1po once daily 9)  Simvastatin 80 Mg Tabs  (Simvastatin) .Marland Kitchen.. 1po once daily 10)  Atenolol-chlorthalidone 50-25 Mg Tabs (Atenolol-chlorthalidone) .... 1/2 by mouth once daily 11)  Indomethacin 50 Mg Caps (Indomethacin) .Marland Kitchen.. 1 tab by mouth three times a day  Patient Instructions: 1)  Please go to Radiology in the basement level for your X-Ray today  2)  Please call the number on the [facility] Card for results of your testing  3)  Continue all previous medications as before this visit  4)  Please schedule a follow-up appointment as needed.   Orders Added: 1)  T-2 View CXR, Same Day [71020.5TC] 2)  T-Ribs Unilateral 2 Views [71100TC] 3)  Est. Patient Level IV [16109]

## 2011-03-10 LAB — GLUCOSE, CAPILLARY
Glucose-Capillary: 118 mg/dL — ABNORMAL HIGH (ref 70–99)
Glucose-Capillary: 118 mg/dL — ABNORMAL HIGH (ref 70–99)
Glucose-Capillary: 122 mg/dL — ABNORMAL HIGH (ref 70–99)
Glucose-Capillary: 130 mg/dL — ABNORMAL HIGH (ref 70–99)
Glucose-Capillary: 135 mg/dL — ABNORMAL HIGH (ref 70–99)
Glucose-Capillary: 168 mg/dL — ABNORMAL HIGH (ref 70–99)
Glucose-Capillary: 169 mg/dL — ABNORMAL HIGH (ref 70–99)

## 2011-03-10 LAB — HEMOGLOBIN AND HEMATOCRIT, BLOOD
HCT: 38.5 % — ABNORMAL LOW (ref 39.0–52.0)
HCT: 41.3 % (ref 39.0–52.0)
Hemoglobin: 12 g/dL — ABNORMAL LOW (ref 13.0–17.0)
Hemoglobin: 12.9 g/dL — ABNORMAL LOW (ref 13.0–17.0)
Hemoglobin: 13.8 g/dL (ref 13.0–17.0)

## 2011-03-10 LAB — COMPREHENSIVE METABOLIC PANEL
ALT: 23 U/L (ref 0–53)
ALT: 26 U/L (ref 0–53)
AST: 25 U/L (ref 0–37)
CO2: 33 mEq/L — ABNORMAL HIGH (ref 19–32)
Calcium: 8.5 mg/dL (ref 8.4–10.5)
Chloride: 95 mEq/L — ABNORMAL LOW (ref 96–112)
Creatinine, Ser: 0.83 mg/dL (ref 0.4–1.5)
GFR calc Af Amer: >60 mL/min (ref >60)
GFR calc Af Amer: >60 mL/min (ref >60)
GFR calc non Af Amer: >60 mL/min (ref >60)
Glucose, Bld: 141 mg/dL — ABNORMAL HIGH (ref 70–99)
Sodium: 132 mEq/L — ABNORMAL LOW (ref 135–145)
Total Bilirubin: 0.9 mg/dL (ref 0.3–1.2)
Total Protein: 6.3 g/dL (ref 6.0–8.3)

## 2011-03-10 LAB — ABO/RH: ABO/RH(D): A NEG

## 2011-03-10 LAB — PROTIME-INR
INR: 1.83 — ABNORMAL HIGH (ref 0.00–1.49)
Prothrombin Time: 14.6 seconds (ref 11.6–15.2)

## 2011-03-10 LAB — TYPE AND SCREEN
ABO/RH(D): A NEG
Antibody Screen: NEGATIVE

## 2011-03-26 LAB — URINALYSIS, ROUTINE W REFLEX MICROSCOPIC
Bilirubin Urine: NEGATIVE
Glucose, UA: NEGATIVE mg/dL
Ketones, ur: NEGATIVE mg/dL
Protein, ur: NEGATIVE mg/dL

## 2011-03-26 LAB — DIFFERENTIAL
Basophils Absolute: 0.1 10*3/uL (ref 0.0–0.1)
Basophils Relative: 1 % (ref 0–1)
Eosinophils Relative: 3 % (ref 0–5)
Lymphocytes Relative: 28 % (ref 12–46)
Neutro Abs: 5.9 10*3/uL (ref 1.7–7.7)

## 2011-03-26 LAB — PROTIME-INR: Prothrombin Time: 13.7 seconds (ref 11.6–15.2)

## 2011-03-26 LAB — APTT: aPTT: 29 seconds (ref 24–37)

## 2011-03-26 LAB — COMPREHENSIVE METABOLIC PANEL
BUN: 15 mg/dL (ref 6–23)
CO2: 34 mEq/L — ABNORMAL HIGH (ref 19–32)
Chloride: 101 mEq/L (ref 96–112)
Creatinine, Ser: 0.87 mg/dL (ref 0.4–1.5)
GFR calc non Af Amer: >60 mL/min (ref >60)
Glucose, Bld: 124 mg/dL — ABNORMAL HIGH (ref 70–99)
Total Bilirubin: 0.5 mg/dL (ref 0.3–1.2)

## 2011-03-26 LAB — CBC
HCT: 42.6 % (ref 39.0–52.0)
Hemoglobin: 14.5 g/dL (ref 13.0–17.0)
MCV: 88.7 fL (ref 78.0–100.0)
RBC: 4.81 MIL/uL (ref 4.22–5.81)
WBC: 9.5 10*3/uL (ref 4.0–10.5)

## 2011-04-19 ENCOUNTER — Other Ambulatory Visit: Payer: Self-pay | Admitting: Internal Medicine

## 2011-05-11 NOTE — Op Note (Signed)
[facility]  Patient:    Leon Nelson, Leon Nelson                      MRN: [id number] Proc. Date: 11/18/00 Adm. Date:  [id number] Attending:  Trisha Mangle                           Operative Report  PREOPERATIVE DIAGNOSIS:  Right ureteral calculi.  POSTOPERATIVE DIAGNOSIS:  Right ureteral calculi.  OPERATION:  Cystoscopy, right retrograde pyelogram with interpretation, ureteroscopy and laser lithotripsy of stone with JJ stent placement.  SURGEON:  Mark C. Vernie Ammons, M.D.  ANESTHESIA:  General endotracheal.  DRAINS:  28 cm 6-French JJ stent in right ureter with string.  SPECIMENS:  None.  BLOOD LOSS:  Less than 5 cc.  COMPLICATIONS:  None.  INDICATIONS:  The patient is a 55 year old white male who has had a stone in his right ureter for some time now.  It was first noted back on September 10, 2000, after he had some flank pain.  He had been followed with very slow progression of the stone with serial KUBs, and eventually the stone failed to progress any further.  He has intermittent discomfort from the stone. Therefore, he is brought to the OR for treatment.  He understands the risks, complications, alternatives, and limitations.  DESCRIPTION OF PROCEDURE:  After informed consent, the patient was brought to the major OR, placed on the table, and administered general endotracheal anesthesia.  He was then moved into the dorsolithotomy position, and his genitalia was sterilely prepped and draped.  I introduced a 22-French cystoscope per urethra, noting the urethra was entirely normal down to the sphincter.  The prostatic urethra revealed some mild elongation and bilobar hypertrophy but no lesions, and the bladder itself was noted to be normal in appearance with no tumor, stones, or inflammatory lesions.  The right ureteral orifice was identified and cannulated with an open-ended 6-French ureteral catheter, and a right retrograde pyelogram was  performed under direct fluoroscopy.  I noted what appeared to be normal-appearing ureteral caliber distally with a stone seen in approximately the mid ureteral region and an area of narrowing just below where the stone was seen as a filling defect.  The remainder of the collecting system was noted to be normal.  The guidewire was then passed up the ureter, and I then pass a dilating balloon over the guidewire and dilated the ureteral orifice.  Once this was achieved, I passed the rigid 6-French ureteroscope next to the guidewire, up the ureter, and identified the stone and photographed it.  I then engaged the stone in a secure basket and attempted to extract the stone with the scope but was unable to move it past an area over the upper portion of the sacrum.  I, therefore, passed the basket and the stone back up the ureter slightly and removed the ureteroscope, leaving the basket in place.  I then placed the dilating balloon over the guidewire and dilated this area once again and noted some pretty significant wasting in this area consistent with the location of the stone and some residual inflammation and/or scarring.  By then, I tried to again extract the stone and kept meeting resistance at this location.  I, therefore, passed the stone back up in the ureter.  I then passed the 200 micron laser fiber through the ureteroscope and performed in situ laser lithotripsy of the stone until it  was fragmented completely.  Once this was completed, I reloaded the cystoscope and passed a JJ stent over the guidewire into the area of the renal pelvis, removing the guidewire with good curl being noted in the renal pelvis and bladder.  I then drained the bladder, and the patient was awakened and taken to the recovery room in stable satisfactory condition.  He tolerated the procedure well, and there were no intraoperative complications.  The stent was then affixed to the dorsum of the penis with pink  tape.  He will return to my office on Friday for consideration of stent removal after KUB. DD:  11/18/00 TD:  11/18/00 Job: [id number]

## 2011-05-11 NOTE — Procedures (Signed)
[facility]  Patient:    MADDYX, WIECK Visit Number: [id number] MRN: [id number]          Service Type: END Location: ENDO Attending Physician:  Orland Mustard Dictated by:   Llana Aliment. Randa Evens, M.D. Proc. Date: 11/17/01 Admit Date:  11/17/2001   CC:         Dellis Anes. Idell Pickles, M.D.   Procedure Report  PROCEDURE:  Colonoscopy and coagulation of polyp.  SURGEON:  James L. Edwards, M.D.  MEDICATIONS:  Fentanyl 150 mcg and Versed 10 mg IV.  SCOPE:  Olympus adult video colonoscope.  INDICATIONS:  This is done a three year follow up for a gentleman who has had a very large polyp removed in the past.  This is done to screen for additional polyps.  DESCRIPTION OF PROCEDURE:  The procedure had been explained to the patient and consent obtained.  With the patient in the left lateral decubitus position, the adult video colonoscope was inserted and advanced under direct visualization.  The prep was excellent.  We were able to reach to the cecum. Using abdominal pressure and position changes, the ileocecal valve was seen. The scope was withdrawn.  The cecum, ascending colon, hepatic flexure, transverse colon, splenic flexure, descending, and sigmoid colon were seen well upon removal.  In the descending colon, a 3 mm to 4 mm polyp was seen and was cauterized.  No other polyps were seen.  The sigmoid colon was free of polyps as was the rectum.  The scope was withdrawn.  The patient tolerated the procedure well.  IMPRESSION:  Descending colon polyp, removed.  PLAN:  We will recommend repeating in three years.  Routine post polypectomy instructions. Dictated by:   Llana Aliment. Randa Evens, M.D. Attending Physician:  Orland Mustard DD:  11/17/01 TD:  11/18/01 Job: [id number]

## 2011-05-23 ENCOUNTER — Other Ambulatory Visit: Payer: Self-pay | Admitting: Internal Medicine

## 2011-06-03 ENCOUNTER — Other Ambulatory Visit: Payer: Self-pay | Admitting: Internal Medicine

## 2011-06-04 ENCOUNTER — Other Ambulatory Visit: Payer: Self-pay

## 2011-06-04 MED ORDER — SIMVASTATIN 80 MG PO TABS: 80.0000 mg | ORAL_TABLET | Freq: Every day | ORAL | Status: DC

## 2011-07-10 ENCOUNTER — Other Ambulatory Visit: Payer: Self-pay | Admitting: *Deleted

## 2011-07-10 MED ORDER — LEVOTHYROXINE SODIUM 25 MCG PO TABS: 25.0000 ug | ORAL_TABLET | Freq: Every day | ORAL | Status: DC

## 2011-07-10 MED ORDER — VENLAFAXINE HCL 75 MG PO TABS: 75.0000 mg | ORAL_TABLET | Freq: Every day | ORAL | Status: DC

## 2011-07-10 MED ORDER — PIOGLITAZONE HCL-METFORMIN HCL 15-500 MG PO TABS: 1.0000 | ORAL_TABLET | Freq: Every day | ORAL | Status: DC

## 2011-07-10 MED ORDER — POTASSIUM CHLORIDE CRYS ER 20 MEQ PO TBCR: 20.0000 meq | EXTENDED_RELEASE_TABLET | Freq: Every day | ORAL | Status: DC

## 2011-07-10 NOTE — Telephone Encounter (Signed)
Ok for bridge meds - to [facility]

## 2011-07-10 NOTE — Telephone Encounter (Signed)
Patient states that he has appointment for CPE on 08/14/2011 but will be needing medications prior to OV. Pt is requesting refills for: Levothyroxine ; Actoplus-Met 15/500; Venlafaxine HCl 75mg ; Potassium Chloride Requesting that medication refills be sent to [facility]. LMOM to clarify request for: Actoplus-Met & Potassium Chloride which have active refills from 06/03/11 refills to [facility].

## 2011-08-08 ENCOUNTER — Ambulatory Visit: Payer: BC Managed Care – PPO

## 2011-08-08 DIAGNOSIS — Z1289 Encounter for screening for malignant neoplasm of other sites: Secondary | ICD-10-CM

## 2011-08-08 DIAGNOSIS — Z0000 Encounter for general adult medical examination without abnormal findings: Secondary | ICD-10-CM

## 2011-08-08 LAB — URINALYSIS
Bilirubin Urine: NEGATIVE
Hgb urine dipstick: NEGATIVE
Nitrite: NEGATIVE
Urobilinogen, UA: 0.2 (ref 0.0–1.0)

## 2011-08-08 LAB — CBC WITH DIFFERENTIAL/PLATELET
Basophils Relative: 0.3 % (ref 0.0–3.0)
Eosinophils Relative: 4.1 % (ref 0.0–5.0)
Lymphocytes Relative: 18.6 % (ref 12.0–46.0)
MCV: 85.9 fl (ref 78.0–100.0)
Monocytes Absolute: 1.1 10*3/uL — ABNORMAL HIGH (ref 0.1–1.0)
Neutrophils Relative %: 66.7 % (ref 43.0–77.0)
RBC: 4.83 Mil/uL (ref 4.22–5.81)
WBC: 10.4 10*3/uL (ref 4.5–10.5)

## 2011-08-08 LAB — HEPATIC FUNCTION PANEL
ALT: 30 U/L (ref 0–53)
AST: 29 U/L (ref 0–37)
Albumin: 4 g/dL (ref 3.5–5.2)
Total Protein: 7.2 g/dL (ref 6.0–8.3)

## 2011-08-08 LAB — BASIC METABOLIC PANEL
BUN: 21 mg/dL (ref 6–23)
Chloride: 105 mEq/L (ref 96–112)
Glucose, Bld: 146 mg/dL — ABNORMAL HIGH (ref 70–99)
Potassium: 4.3 mEq/L (ref 3.5–5.1)

## 2011-08-08 LAB — LIPID PANEL: Cholesterol: 141 mg/dL (ref 0–200)

## 2011-08-08 LAB — TSH: TSH: 2.33 u[IU]/mL (ref 0.35–5.50)

## 2011-08-12 ENCOUNTER — Encounter: Payer: Self-pay | Admitting: Internal Medicine

## 2011-08-12 DIAGNOSIS — Z0001 Encounter for general adult medical examination with abnormal findings: Secondary | ICD-10-CM | POA: Insufficient documentation

## 2011-08-12 DIAGNOSIS — Z0000 Encounter for general adult medical examination without abnormal findings: Secondary | ICD-10-CM | POA: Insufficient documentation

## 2011-08-14 ENCOUNTER — Ambulatory Visit (INDEPENDENT_AMBULATORY_CARE_PROVIDER_SITE_OTHER): Payer: BC Managed Care – PPO | Admitting: Internal Medicine

## 2011-08-14 ENCOUNTER — Encounter: Payer: Self-pay | Admitting: Internal Medicine

## 2011-08-14 VITALS — BP 112/72 | HR 69 | Temp 98.6°F | Ht 73.0 in | Wt 335.8 lb

## 2011-08-14 DIAGNOSIS — E119 Type 2 diabetes mellitus without complications: Secondary | ICD-10-CM

## 2011-08-14 DIAGNOSIS — R011 Cardiac murmur, unspecified: Secondary | ICD-10-CM | POA: Insufficient documentation

## 2011-08-14 DIAGNOSIS — Z0000 Encounter for general adult medical examination without abnormal findings: Secondary | ICD-10-CM

## 2011-08-14 MED ORDER — VENLAFAXINE HCL 75 MG PO TABS: 75.0000 mg | ORAL_TABLET | Freq: Every day | ORAL | Status: DC

## 2011-08-14 MED ORDER — SIMVASTATIN 80 MG PO TABS: 80.0000 mg | ORAL_TABLET | Freq: Every day | ORAL | Status: DC

## 2011-08-14 MED ORDER — PIOGLITAZONE HCL-METFORMIN HCL 15-500 MG PO TABS: 1.0000 | ORAL_TABLET | Freq: Every day | ORAL | Status: DC

## 2011-08-14 MED ORDER — POTASSIUM CHLORIDE CRYS ER 20 MEQ PO TBCR: 40.0000 meq | EXTENDED_RELEASE_TABLET | Freq: Every day | ORAL | Status: DC

## 2011-08-14 MED ORDER — ATENOLOL-CHLORTHALIDONE 50-25 MG PO TABS: 0.5000 | ORAL_TABLET | Freq: Every day | ORAL | Status: DC

## 2011-08-14 MED ORDER — LEVOTHYROXINE SODIUM 25 MCG PO TABS: 25.0000 ug | ORAL_TABLET | Freq: Every day | ORAL | Status: DC

## 2011-08-14 MED ORDER — ALLOPURINOL 300 MG PO TABS: 300.0000 mg | ORAL_TABLET | Freq: Every day | ORAL | Status: DC

## 2011-08-14 MED ORDER — LISINOPRIL 20 MG PO TABS: ORAL_TABLET | ORAL | Status: DC

## 2011-08-14 NOTE — Progress Notes (Signed)
Subjective:    Patient ID: Leon Nelson, male    DOB: 26-Mar-1956, 55 y.o.   MRN: [id number]  HPI Here for wellness and f/u;  Overall doing ok;  Pt denies CP, worsening SOB, DOE, wheezing, orthopnea, PND, worsening LE edema, palpitations, dizziness or syncopem, though does have ongong doe, hard to lose wt.  Pt denies neurological change such as new Headache, facial or extremity weakness.  Pt denies polydipsia, polyuria, or low sugar symptoms. Pt states overall good compliance with treatment and medications, good tolerability, and trying to follow lower cholesterol diet.  Pt denies worsening depressive symptoms, suicidal ideation or panic. No fever, wt loss, night sweats, loss of appetite, or other constitutional symptoms.  Pt states good ability with ADL's, low fall risk, home safety reviewed and adequate, no significant changes in hearing or vision, and occasionally active with exercise.  Does also have right knee pain, swelling in the psat few days and plans to f/u with ortho - Dr Michelene Heady.  Unfort also was laid off after 38 yrs was laid off, insurance lapses end of sept 2012.  He is starting a new small business.  Past Medical History  Diagnosis Date  . ABDOMINAL PAIN OTHER SPECIFIED SITE 07/20/2008  . CHEST PAIN 02/06/2011  . COLONIC POLYPS, HX OF 05/26/2008  . DEPRESSION 05/26/2008  . DIABETES MELLITUS, TYPE II 05/26/2008  . HYPERLIPIDEMIA 05/26/2008  . HYPERTENSION 05/26/2008  . HYPOTHYROIDISM 05/26/2008  . KNEE PAIN, RIGHT, ACUTE 07/01/2008  . Personal history of urinary calculi 05/26/2008  . UTI'S, HX OF 05/26/2008   Past Surgical History  Procedure Date  . Tonsillectomy   . S/p right knee arthroplasty      complicated by patellar tendon rupture Jan 2011 Dr. Darrelyn Hillock    reports that he has never smoked. He does not have any smokeless tobacco history on file. He reports that he does not drink alcohol or use illicit drugs. family history includes Alcohol abuse in his other; Cancer in his mother;  Diabetes in his other; Heart disease in his father and mother; and Hyperlipidemia in his father and mother. Allergies  Allergen Reactions  . Penicillins   . Sulfonamide Derivatives    Current Outpatient Prescriptions on File Prior to Visit  Medication Sig Dispense Refill  . allopurinol (ZYLOPRIM) 300 MG tablet TAKE ONE TABLET BY MOUTH EVERY DAY  90 tablet  3  . dicyclomine (BENTYL) 20 MG tablet TAKE ONE TABLET BY MOUTH THREE TIMES DAILY AS NEEDED  180 tablet  3  . levothyroxine (SYNTHROID, LEVOTHROID) 25 MCG tablet Take 1 tablet (25 mcg total) by mouth daily.  90 tablet  0  . lisinopril (PRINIVIL,ZESTRIL) 20 MG tablet TAKE ONE TABLET BY MOUTH EVERY DAY  90 tablet  3  . pioglitazone-metformin (ACTOPLUS MET) 15-500 MG per tablet Take 1 tablet by mouth daily.  90 tablet  1  . simvastatin (ZOCOR) 80 MG tablet Take 1 tablet (80 mg total) by mouth at bedtime.  90 tablet  0  . venlafaxine (EFFEXOR) 75 MG tablet Take 1 tablet (75 mg total) by mouth daily.  90 tablet  0   Review of Systems Review of Systems  Constitutional: Negative for diaphoresis, activity change, appetite change and unexpected weight change.  HENT: Negative for hearing loss, ear pain, facial swelling, mouth sores and neck stiffness.   Eyes: Negative for pain, redness and visual disturbance.  Respiratory: Negative for shortness of breath and wheezing.   Cardiovascular: Negative for chest pain and palpitations.  Gastrointestinal:  Negative for diarrhea, blood in stool, abdominal distention and rectal pain.  Genitourinary: Negative for hematuria, flank pain and decreased urine volume.  Musculoskeletal: Negative for myalgias and joint swelling.  Skin: Negative for color change and wound.  Neurological: Negative for syncope and numbness.  Hematological: Negative for adenopathy.  Psychiatric/Behavioral: Negative for hallucinations, self-injury, decreased concentration and agitation.     Objective:   Physical Exam BP 112/72   Pulse 69  Temp(Src) 98.6 F (37 C) (Oral)  Ht 6\' 1"  (1.854 m)  Wt 335 lb 12.8 oz (152.318 kg)  BMI 44.30 kg/m2  SpO2 95% Physical Exam  VS noted, morbid obese, hard to lose wt Constitutional: Pt is oriented to person, place, and time. Appears well-developed and well-nourished.  HENT:  Head: Normocephalic and atraumatic.  Right Ear: External ear normal.  Left Ear: External ear normal.  Nose: Nose normal.  Mouth/Throat: Oropharynx is clear and moist.  Eyes: Conjunctivae and EOM are normal. Pupils are equal, round, and reactive to light.  Neck: Normal range of motion. Neck supple. No JVD present. No tracheal deviation present.  Cardiovascular: Normal rate, regular rhythm, normal heart sounds and intact distal pulses.  except for gr 1-2 sys murmur LUSB without radiation Pulmonary/Chest: Effort normal and breath sounds normal.  Abdominal: Soft. Bowel sounds are normal. There is no tenderness.  Musculoskeletal: Normal range of motion. Exhibits no edema.  Lymphadenopathy:  Has no cervical adenopathy.  Neurological: Pt is alert and oriented to person, place, and time. Pt has normal reflexes. No cranial nerve deficit.  Skin: Skin is warm and dry. No rash noted.  Psychiatric:  Has  normal mood and affect. Behavior is normal.  Rigth knee with TKA scar with lateral swelling, tendr but FROM       Assessment & Plan:

## 2011-08-14 NOTE — Assessment & Plan Note (Signed)
?   New AS - for echo, Continue all other medications as before

## 2011-08-14 NOTE — Assessment & Plan Note (Signed)

## 2011-08-14 NOTE — Patient Instructions (Signed)
Continue all other medications as before You will be contacted regarding the referral for: echocardiogram You are otherwise up to date with prevention today Please return in 6 mo with Lab testing done 3-5 days before

## 2011-08-20 ENCOUNTER — Encounter: Payer: Self-pay | Admitting: *Deleted

## 2011-09-04 ENCOUNTER — Ambulatory Visit (HOSPITAL_COMMUNITY): Payer: BC Managed Care – PPO | Attending: Internal Medicine | Admitting: Radiology

## 2011-09-04 ENCOUNTER — Other Ambulatory Visit (HOSPITAL_COMMUNITY): Payer: Self-pay | Admitting: Cardiology

## 2011-09-04 DIAGNOSIS — E785 Hyperlipidemia, unspecified: Secondary | ICD-10-CM | POA: Insufficient documentation

## 2011-09-04 DIAGNOSIS — E119 Type 2 diabetes mellitus without complications: Secondary | ICD-10-CM | POA: Insufficient documentation

## 2011-09-04 DIAGNOSIS — I10 Essential (primary) hypertension: Secondary | ICD-10-CM | POA: Insufficient documentation

## 2011-09-04 DIAGNOSIS — R011 Cardiac murmur, unspecified: Secondary | ICD-10-CM | POA: Insufficient documentation

## 2011-09-04 MED ORDER — PERFLUTREN PROTEIN A MICROSPH IV SUSP
3.0000 mL | Freq: Once | INTRAVENOUS | Status: AC
  Administered 2011-09-04: 3 mL via INTRAVENOUS

## 2011-09-05 ENCOUNTER — Telehealth: Payer: Self-pay

## 2011-09-05 NOTE — Telephone Encounter (Signed)
Sept 11 echo - normal function overall, no signficant abnormalities such as damage from heart attacks or valve disease

## 2011-09-05 NOTE — Telephone Encounter (Signed)
Patient called requesting results of Echo.  

## 2011-09-06 ENCOUNTER — Telehealth: Payer: Self-pay

## 2011-09-06 NOTE — Telephone Encounter (Signed)
Patient informed. 

## 2011-09-06 NOTE — Telephone Encounter (Signed)
Called patient; left message to call back.

## 2011-09-06 NOTE — Telephone Encounter (Signed)
[facility] called to confirm patients prescription refill for Effexor XR 75 mg CP 24 #90 with 3 refills.

## 2011-09-06 NOTE — Telephone Encounter (Signed)
Called the patient left message to call back 

## 2011-09-17 ENCOUNTER — Other Ambulatory Visit: Payer: Self-pay

## 2011-09-17 MED ORDER — PIOGLITAZONE HCL-METFORMIN HCL 15-500 MG PO TABS: 1.0000 | ORAL_TABLET | Freq: Every day | ORAL | Status: DC

## 2011-09-21 ENCOUNTER — Ambulatory Visit (INDEPENDENT_AMBULATORY_CARE_PROVIDER_SITE_OTHER): Payer: BC Managed Care – PPO

## 2011-09-21 DIAGNOSIS — Z23 Encounter for immunization: Secondary | ICD-10-CM

## 2011-11-20 ENCOUNTER — Other Ambulatory Visit: Payer: Self-pay | Admitting: Internal Medicine

## 2011-11-20 MED ORDER — POTASSIUM CHLORIDE CRYS ER 20 MEQ PO TBCR: 40.0000 meq | EXTENDED_RELEASE_TABLET | Freq: Every day | ORAL | Status: DC

## 2011-11-20 MED ORDER — VENLAFAXINE HCL 75 MG PO TABS: 75.0000 mg | ORAL_TABLET | Freq: Every day | ORAL | Status: DC

## 2011-11-20 MED ORDER — SIMVASTATIN 80 MG PO TABS: 80.0000 mg | ORAL_TABLET | Freq: Every day | ORAL | Status: DC

## 2011-11-20 MED ORDER — PIOGLITAZONE HCL-METFORMIN HCL 15-500 MG PO TABS: 1.0000 | ORAL_TABLET | Freq: Every day | ORAL | Status: DC

## 2011-11-20 NOTE — Telephone Encounter (Signed)
Patient informed prescriptions (Hardcopies) requested are ready for pickup, informed the patient of MD's instructions for filling his prescriptions.

## 2011-11-20 NOTE — Telephone Encounter (Signed)
Patient called back requesting a 90 day of all meds.

## 2011-11-20 NOTE — Telephone Encounter (Signed)
90 day rx done for all requested meds  All are generic, therefore no samples available  Remember, simvsatatin for example is $5/mo at [facility] - he should check there for his meds (and the actosplus med may be free, I'm not sure)

## 2011-11-20 NOTE — Telephone Encounter (Signed)
This pt called and is stating he no longer has insurance and needs these prescriptions written out so he can come by and pick them up and take them to a pharmacy in person.  He is also asking for any samples of these medications.  Potassium Chloride - Simvastatin 80mg  (he asked if there was a cheaper med than this he could substitute)  Venlafaxine - 75mg  Pioglitazone Metformin 15-500mg 

## 2011-12-04 ENCOUNTER — Telehealth: Payer: Self-pay

## 2011-12-04 NOTE — Telephone Encounter (Signed)
Pharmacist informed. 

## 2011-12-04 NOTE — Telephone Encounter (Signed)
Ok for capsule or tablet

## 2011-12-04 NOTE — Telephone Encounter (Signed)
Leon Nelson from [facility] received rx for Effexor XR 75 tablet. Patient states he is taking XR capsule. Needs clarification immediate release or XR, please advise [phone]

## 2012-01-29 ENCOUNTER — Ambulatory Visit: Payer: BC Managed Care – PPO | Admitting: Internal Medicine

## 2012-02-25 ENCOUNTER — Ambulatory Visit: Payer: BC Managed Care – PPO | Admitting: Internal Medicine

## 2012-02-27 ENCOUNTER — Other Ambulatory Visit (INDEPENDENT_AMBULATORY_CARE_PROVIDER_SITE_OTHER): Payer: BC Managed Care – PPO

## 2012-02-27 DIAGNOSIS — E119 Type 2 diabetes mellitus without complications: Secondary | ICD-10-CM

## 2012-02-27 LAB — LIPID PANEL
Cholesterol: 147 mg/dL (ref 0–200)
HDL: 36.6 mg/dL — ABNORMAL LOW (ref >39.00)
LDL Cholesterol: 88 mg/dL (ref 0–99)
Total CHOL/HDL Ratio: 4
Triglycerides: 113 mg/dL (ref 0.0–149.0)
VLDL: 22.6 mg/dL (ref 0.0–40.0)

## 2012-02-27 LAB — BASIC METABOLIC PANEL
BUN: 23 mg/dL (ref 6–23)
Calcium: 9.6 mg/dL (ref 8.4–10.5)
Chloride: 103 mEq/L (ref 96–112)
Creatinine, Ser: 0.8 mg/dL (ref 0.4–1.5)

## 2012-03-03 ENCOUNTER — Encounter: Payer: Self-pay | Admitting: Internal Medicine

## 2012-03-03 ENCOUNTER — Ambulatory Visit (INDEPENDENT_AMBULATORY_CARE_PROVIDER_SITE_OTHER): Payer: Self-pay | Admitting: Internal Medicine

## 2012-03-03 VITALS — BP 108/80 | HR 60 | Temp 99.0°F | Ht 72.0 in | Wt 337.2 lb

## 2012-03-03 DIAGNOSIS — I10 Essential (primary) hypertension: Secondary | ICD-10-CM

## 2012-03-03 DIAGNOSIS — E119 Type 2 diabetes mellitus without complications: Secondary | ICD-10-CM

## 2012-03-03 DIAGNOSIS — Z0000 Encounter for general adult medical examination without abnormal findings: Secondary | ICD-10-CM

## 2012-03-03 DIAGNOSIS — E785 Hyperlipidemia, unspecified: Secondary | ICD-10-CM

## 2012-03-03 NOTE — Assessment & Plan Note (Signed)
stable overall by hx and exam, most recent data reviewed with pt, and pt to continue medical treatment as before  BP Readings from Last 3 Encounters:  03/03/12 108/80  08/14/11 112/72  02/06/11 112/72

## 2012-03-03 NOTE — Assessment & Plan Note (Signed)
stable overall by hx and exam, most recent data reviewed with pt, and pt to continue medical treatment as before  Lab Results  Component Value Date   LDLCALC 88 02/27/2012

## 2012-03-03 NOTE — Assessment & Plan Note (Signed)
stable overall by hx and exam, most recent data reviewed with pt, and pt to continue medical treatment as before  Lab Results  Component Value Date   HGBA1C 6.9* 02/27/2012

## 2012-03-03 NOTE — Patient Instructions (Signed)
Continue all other medications as before Please return in 6 mo with Lab testing done 3-5 days before  

## 2012-03-03 NOTE — Progress Notes (Signed)
Subjective:    Patient ID: Leon Nelson, male    DOB: 29-Apr-1956, 56 y.o.   MRN: [id number]  HPI  Here to f/u; overall doing ok,  Pt denies chest pain, increased sob or doe, wheezing, orthopnea, PND, increased LE swelling, palpitations, dizziness or syncope.  Pt denies new neurological symptoms such as new headache, or facial or extremity weakness or numbness   Pt denies polydipsia, polyuria, or low sugar symptoms such as weakness or confusion improved with po intake.  Pt states overall good compliance with meds, trying to follow lower cholesterol, diabetic diet, wt overall stable but little exercise however.  Did have a ruptured ight patellar tendon, will take at least 2 yrs.  Just got back from [place] with lots of walking but knee o/w no change.  Did [place] with abrasion to left knee, as well as left shoudler strain, but now improved.  Note was laid off, now self employed/self insured Past Medical History  Diagnosis Date  . ABDOMINAL PAIN OTHER SPECIFIED SITE 07/20/2008  . CHEST PAIN 02/06/2011  . COLONIC POLYPS, HX OF 05/26/2008  . DEPRESSION 05/26/2008  . DIABETES MELLITUS, TYPE II 05/26/2008  . HYPERLIPIDEMIA 05/26/2008  . HYPERTENSION 05/26/2008  . HYPOTHYROIDISM 05/26/2008  . KNEE PAIN, RIGHT, ACUTE 07/01/2008  . Personal history of urinary calculi 05/26/2008  . UTI'S, HX OF 05/26/2008   Past Surgical History  Procedure Date  . Tonsillectomy   . S/p right knee arthroplasty      complicated by patellar tendon rupture Jan 2011 Dr. Darrelyn Hillock    reports that he has never smoked. He does not have any smokeless tobacco history on file. He reports that he does not drink alcohol or use illicit drugs. family history includes Alcohol abuse in his other; Cancer in his mother; Diabetes in his other; Heart disease in his father and mother; and Hyperlipidemia in his father and mother. Allergies  Allergen Reactions  . Penicillins   . Sulfonamide Derivatives    Current Outpatient  Prescriptions on File Prior to Visit  Medication Sig Dispense Refill  . allopurinol (ZYLOPRIM) 300 MG tablet Take 1 tablet (300 mg total) by mouth daily.  90 tablet  3  . atenolol-chlorthalidone (TENORETIC) 50-25 MG per tablet Take 0.5 tablets by mouth daily.  45 tablet  3  . dicyclomine (BENTYL) 20 MG tablet TAKE ONE TABLET BY MOUTH THREE TIMES DAILY AS NEEDED  180 tablet  3  . levothyroxine (SYNTHROID, LEVOTHROID) 25 MCG tablet Take 1 tablet (25 mcg total) by mouth daily.  90 tablet  3  . lisinopril (PRINIVIL,ZESTRIL) 20 MG tablet 1 tab by mouth per day  90 tablet  3  . pioglitazone-metformin (ACTOPLUS MET) 15-500 MG per tablet Take 1 tablet by mouth daily.  90 tablet  2  . potassium chloride SA (K-DUR,KLOR-CON) 20 MEQ tablet Take 2 tablets (40 mEq total) by mouth daily.  180 tablet  2  . simvastatin (ZOCOR) 80 MG tablet Take 1 tablet (80 mg total) by mouth at bedtime.  90 tablet  2  . venlafaxine (EFFEXOR) 75 MG tablet Take 1 tablet (75 mg total) by mouth daily.  90 tablet  2   Review of Systems Review of Systems  Constitutional: Negative for diaphoresis and unexpected weight change.  HENT: Negative for drooling and tinnitus.   Eyes: Negative for photophobia and visual disturbance.  Respiratory: Negative for choking and stridor.   Gastrointestinal: Negative for vomiting and blood in stool.  Genitourinary:  Negative for hematuria and decreased urine volume.  Musculoskeletal: Negative for gait problem.     Objective:   Physical Exam BP 108/80  Pulse 60  Temp(Src) 99 F (37.2 C) (Oral)  Ht 6' (1.829 m)  Wt 337 lb 4 oz (152.976 kg)  BMI 45.74 kg/m2  SpO2 96% Physical Exam  VS noted Constitutional: Pt appears well-developed and well-nourished.  HENT: Head: Normocephalic.  Right Ear: External ear normal.  Left Ear: External ear normal.  Eyes: Conjunctivae and EOM are normal. Pupils are equal, round, and reactive to light.  Neck: Normal range of motion. Neck supple.  Cardiovascular:  Normal rate and regular rhythm.   Pulmonary/Chest: Effort normal and breath sounds normal.  Abd:  Soft, NT, non-distended, + BS Neurological: Pt is alert. No cranial nerve deficit.  Skin: Skin is warm. No erythema.  Psychiatric: Pt behavior is normal. Thought content normal.     Assessment & Plan:

## 2012-04-08 ENCOUNTER — Telehealth: Payer: Self-pay

## 2012-04-08 MED ORDER — SILDENAFIL CITRATE 100 MG PO TABS: 50.0000 mg | ORAL_TABLET | Freq: Every day | ORAL | Status: DC | PRN

## 2012-04-08 NOTE — Telephone Encounter (Signed)
Done per emr 

## 2012-04-08 NOTE — Telephone Encounter (Signed)
Patient would like a prescription for Viagra, please advise call back number is [phone]. Please send to [facility] [address]

## 2012-04-09 NOTE — Telephone Encounter (Signed)
Called the patient informed prescription requested has been sent to pharmacy. 

## 2012-05-29 ENCOUNTER — Other Ambulatory Visit: Payer: Self-pay

## 2012-05-29 MED ORDER — VENLAFAXINE HCL ER 75 MG PO CP24: 75.0000 mg | ORAL_CAPSULE | Freq: Every day | ORAL | Status: DC

## 2012-05-29 MED ORDER — VENLAFAXINE HCL 75 MG PO TABS: 75.0000 mg | ORAL_TABLET | Freq: Every day | ORAL | Status: DC

## 2012-05-29 MED ORDER — PIOGLITAZONE HCL-METFORMIN HCL 15-500 MG PO TABS: 1.0000 | ORAL_TABLET | Freq: Every day | ORAL | Status: DC

## 2012-05-29 MED ORDER — SIMVASTATIN 80 MG PO TABS: 80.0000 mg | ORAL_TABLET | Freq: Every day | ORAL | Status: DC

## 2012-07-16 ENCOUNTER — Other Ambulatory Visit: Payer: Self-pay | Admitting: Internal Medicine

## 2012-09-01 ENCOUNTER — Other Ambulatory Visit: Payer: Self-pay

## 2012-09-01 MED ORDER — POTASSIUM CHLORIDE CRYS ER 20 MEQ PO TBCR: 40.0000 meq | EXTENDED_RELEASE_TABLET | Freq: Every day | ORAL | Status: DC

## 2012-09-03 ENCOUNTER — Ambulatory Visit: Payer: Self-pay | Admitting: Internal Medicine

## 2012-09-03 DIAGNOSIS — Z0289 Encounter for other administrative examinations: Secondary | ICD-10-CM

## 2012-10-22 ENCOUNTER — Other Ambulatory Visit: Payer: Self-pay | Admitting: Internal Medicine

## 2012-11-03 ENCOUNTER — Other Ambulatory Visit: Payer: Self-pay | Admitting: Internal Medicine

## 2013-01-01 ENCOUNTER — Ambulatory Visit (INDEPENDENT_AMBULATORY_CARE_PROVIDER_SITE_OTHER): Payer: BC Managed Care – PPO | Admitting: Internal Medicine

## 2013-01-01 ENCOUNTER — Encounter: Payer: Self-pay | Admitting: Internal Medicine

## 2013-01-01 VITALS — BP 112/76 | HR 76 | Temp 98.9°F | Ht 72.0 in | Wt 309.5 lb

## 2013-01-01 DIAGNOSIS — J209 Acute bronchitis, unspecified: Secondary | ICD-10-CM

## 2013-01-01 MED ORDER — AZITHROMYCIN 250 MG PO TABS: ORAL_TABLET | ORAL | Status: DC

## 2013-01-01 NOTE — Patient Instructions (Signed)

## 2013-01-01 NOTE — Progress Notes (Signed)
HPI  Pt presents to the clinic with 1 week history of cough and chest congestion. Prior to that he had been really sick with what he believes was the flu. He was running fevers of 103-105 for 8 days, along with fatigue. All of that has improved but now has this bad cough. He is coughing up copious amounts of thick brown sputum. He has taken tylenol and robitussin without relief. The symptoms are getting worse each day.He denies chills or body aches at this time. He denies history of allergies or asthma. He has had sick contacts.  Review of Systems    Past Medical History  Diagnosis Date  . ABDOMINAL PAIN OTHER SPECIFIED SITE 07/20/2008  . CHEST PAIN 02/06/2011  . COLONIC POLYPS, HX OF 05/26/2008  . DEPRESSION 05/26/2008  . DIABETES MELLITUS, TYPE II 05/26/2008  . HYPERLIPIDEMIA 05/26/2008  . HYPERTENSION 05/26/2008  . HYPOTHYROIDISM 05/26/2008  . KNEE PAIN, RIGHT, ACUTE 07/01/2008  . Personal history of urinary calculi 05/26/2008  . UTI'S, HX OF 05/26/2008    Family History  Problem Relation Age of Onset  . Cancer Mother     breast cancer  . Hyperlipidemia Mother   . Heart disease Mother   . Hyperlipidemia Father   . Heart disease Father   . Alcohol abuse Other   . Diabetes Other     History   Social History  . Marital Status: Married    Spouse Name: N/A    Number of Children: N/A  . Years of Education: N/A   Occupational History  . Not on file.   Social History Main Topics  . Smoking status: Never Smoker   . Smokeless tobacco: Not on file  . Alcohol Use: No  . Drug Use: No  . Sexually Active: Not on file   Other Topics Concern  . Not on file   Social History Narrative  . No narrative on file    Allergies  Allergen Reactions  . Penicillins   . Sulfonamide Derivatives      Constitutional: Positive headache, fatigue. Denies fever or abrupt weight changes.  HEENT:  Denies eye redness,eye pain, pressure behind the eyes, facial pain, nasal congestion, ear pain, ringing in  the ears, wax buildup, runny nose or bloody nose. Respiratory: Positive cough and thick brown sputum production. Denies difficulty breathing or shortness of breath.  Cardiovascular: Denies chest pain, chest tightness, palpitations or swelling in the hands or feet.   No other specific complaints in a complete review of systems (except as listed in HPI above).  Objective:    BP 112/76  Pulse 76  Temp 98.9 F (37.2 C) (Oral)  Ht 6' (1.829 m)  Wt 309 lb 8 oz (140.388 kg)  BMI 41.98 kg/m2  SpO2 94% Wt Readings from Last 3 Encounters:  01/01/13 309 lb 8 oz (140.388 kg)  03/03/12 337 lb 4 oz (152.976 kg)  08/14/11 335 lb 12.8 oz (152.318 kg)    General: Appears his stated age, well developed, well nourished in NAD. HEENT: Head: normal shape and size; Eyes: sclera white, no icterus, conjunctiva pink, PERRLA and EOMs intact; Ears: Tm's gray and intact, normal light reflex; Nose: mucosa pink and moist, septum midline; Throat/Mouth: + PND. Teeth present, mucosa pink and moist, no exudate noted, no lesions or ulcerations noted.  Neck: Mild cervical lymphadenopathy. Neck supple, trachea midline. No massses, lumps or thyromegaly present.  Cardiovascular: Normal rate and rhythm. S1,S2 noted.  No murmur, rubs or gallops noted. No JVD or BLE  edema. No carotid bruits noted. Pulmonary/Chest: Normal effort and scattered ronchi throughout with intermittent expiratory wheezes. No respiratory distress.      Assessment & Plan:   Acute bronchitis, new onset with additional workup required:  Continue Mucinex and Robitussin eRx for Azithromax x 5 days Pt declined RX for cough syrup at this time Get some rest and stay well hydrated  RTC as needed or if symptoms persist.

## 2013-02-11 ENCOUNTER — Ambulatory Visit (INDEPENDENT_AMBULATORY_CARE_PROVIDER_SITE_OTHER): Payer: BC Managed Care – PPO | Admitting: Internal Medicine

## 2013-02-11 ENCOUNTER — Encounter: Payer: Self-pay | Admitting: Internal Medicine

## 2013-02-11 VITALS — BP 122/78 | HR 67 | Temp 97.7°F | Ht 72.0 in | Wt 315.0 lb

## 2013-02-11 DIAGNOSIS — L0291 Cutaneous abscess, unspecified: Secondary | ICD-10-CM

## 2013-02-11 DIAGNOSIS — Z23 Encounter for immunization: Secondary | ICD-10-CM

## 2013-02-11 DIAGNOSIS — L0390 Cellulitis, unspecified: Secondary | ICD-10-CM

## 2013-02-11 MED ORDER — LEVOFLOXACIN 500 MG PO TABS
500.0000 mg | ORAL_TABLET | Freq: Every day | ORAL | Status: DC
Start: 2013-02-11 — End: 2013-07-13

## 2013-02-11 NOTE — Patient Instructions (Addendum)
Cellulitis Cellulitis is an infection of the skin and the tissue beneath it. The infected area is usually red and tender. Cellulitis occurs most often in the arms and lower legs.   CAUSES   Cellulitis is caused by bacteria that enter the skin through cracks or cuts in the skin. The most common types of bacteria that cause cellulitis are Staphylococcus and Streptococcus. SYMPTOMS    Redness and warmth.   Swelling.   Tenderness or pain.   Fever.  DIAGNOSIS  Your caregiver can usually determine what is wrong based on a physical exam. Blood tests may also be done. TREATMENT   Treatment usually involves taking an antibiotic medicine. HOME CARE INSTRUCTIONS    Take your antibiotics as directed. Finish them even if you start to feel better.   Keep the infected arm or leg elevated to reduce swelling.   Apply a warm cloth to the affected area up to 4 times per day to relieve pain.   Only take over-the-counter or prescription medicines for pain, discomfort, or fever as directed by your caregiver.   Keep all follow-up appointments as directed by your caregiver.  SEEK MEDICAL CARE IF:    You notice red streaks coming from the infected area.   Your red area gets larger or turns dark in color.   Your bone or joint underneath the infected area becomes painful after the skin has healed.   Your infection returns in the same area or another area.   You notice a swollen bump in the infected area.   You develop new symptoms.  SEEK IMMEDIATE MEDICAL CARE IF:    You have a fever.   You feel very sleepy.   You develop vomiting or diarrhea.   You have a general ill feeling (malaise) with muscle aches and pains.  MAKE SURE YOU:    Understand these instructions.   Will watch your condition.   Will get help right away if you are not doing well or get worse.  Document Released: 09/19/2005 Document Revised: 06/10/2012 Document Reviewed: 02/25/2012 [employer] Patient Information 2013  [employer]    

## 2013-02-11 NOTE — Progress Notes (Signed)
Subjective:    Patient ID: Leon Nelson, male    DOB: 28-Oct-1956, 57 y.o.   MRN: [id number]  HPI  Pt presents to the clinic today with c/o of a spot behind his right ear. He felt like something was there on Saturday and Sunday but he had his wife look and she did not see anything. On Monday, the areas was painful and she noted that he developed this ulcerated lesion behind his ear. He has not had any trauma to the area or been bit by a bug that he know of. The area is tender to touch. It has not drained. He has not put anything on it. He has never had a ulcer behind his ear like this before. It does not itch.  Review of Systems      Past Medical History  Diagnosis Date  . ABDOMINAL PAIN OTHER SPECIFIED SITE 07/20/2008  . CHEST PAIN 02/06/2011  . COLONIC POLYPS, HX OF 05/26/2008  . DEPRESSION 05/26/2008  . DIABETES MELLITUS, TYPE II 05/26/2008  . HYPERLIPIDEMIA 05/26/2008  . HYPERTENSION 05/26/2008  . HYPOTHYROIDISM 05/26/2008  . KNEE PAIN, RIGHT, ACUTE 07/01/2008  . Personal history of urinary calculi 05/26/2008  . UTI'S, HX OF 05/26/2008    Current Outpatient Prescriptions  Medication Sig Dispense Refill  . allopurinol (ZYLOPRIM) 300 MG tablet TAKE ONE TABLET BY MOUTH EVERY DAY  90 tablet  2  . atenolol-chlorthalidone (TENORETIC) 50-25 MG per tablet TAKE ONE-HALF TABLET BY MOUTH EVERY DAY  45 tablet  2  . dicyclomine (BENTYL) 20 MG tablet TAKE ONE TABLET BY MOUTH THREE TIMES DAILY AS NEEDED  180 tablet  2  . levothyroxine (SYNTHROID, LEVOTHROID) 25 MCG tablet TAKE ONE TABLET BY MOUTH EVERY DAY  90 tablet  2  . lisinopril (PRINIVIL,ZESTRIL) 20 MG tablet TAKE ONE TABLET BY MOUTH EVERY DAY  90 tablet  2  . pioglitazone-metformin (ACTOPLUS MET) 15-500 MG per tablet Take 1 tablet by mouth daily.  90 tablet  3  . potassium chloride SA (K-DUR,KLOR-CON) 20 MEQ tablet Take 2 tablets (40 mEq total) by mouth daily.  180 tablet  1  . sildenafil (VIAGRA) 100 MG tablet Take 50-100 mg by mouth daily as needed.       . simvastatin (ZOCOR) 80 MG tablet Take 1 tablet (80 mg total) by mouth at bedtime.  90 tablet  3  . venlafaxine XR (EFFEXOR XR) 75 MG 24 hr capsule Take 1 capsule (75 mg total) by mouth daily.  90 capsule  3  . levofloxacin (LEVAQUIN) 500 MG tablet Take 1 tablet (500 mg total) by mouth daily.  7 tablet  0   No current facility-administered medications for this visit.    Allergies  Allergen Reactions  . Penicillins   . Sulfonamide Derivatives     Family History  Problem Relation Age of Onset  . Cancer Mother     breast cancer  . Hyperlipidemia Mother   . Heart disease Mother   . Hyperlipidemia Father   . Heart disease Father   . Alcohol abuse Other   . Diabetes Other     History   Social History  . Marital Status: Married    Spouse Name: N/A    Number of Children: N/A  . Years of Education: N/A   Occupational History  . Not on file.   Social History Main Topics  . Smoking status: Never Smoker   . Smokeless tobacco: Not on file  . Alcohol Use: No  . Drug  Use: No  . Sexually Active: Not on file   Other Topics Concern  . Not on file   Social History Narrative  . No narrative on file     Constitutional: Denies fever, malaise, fatigue, headache or abrupt weight changes.  HEENT: Denies eye pain, eye redness, ear pain, ringing in the ears, wax buildup, runny nose, nasal congestion, bloody nose, or sore throat. Skin:  Pt reports spot behind his right ear. Denies redness, rashes, lesions or ulcercations.  Neurological: Denies dizziness, difficulty with memory, difficulty with speech or problems with balance and coordination.   No other specific complaints in a complete review of systems (except as listed in HPI above).  Objective:   Physical Exam  BP 122/78  Pulse 67  Temp(Src) 97.7 F (36.5 C) (Oral)  Ht 6' (1.829 m)  Wt 315 lb (142.883 kg)  BMI 42.71 kg/m2  SpO2 95% Wt Readings from Last 3 Encounters:  02/11/13 315 lb (142.883 kg)  01/01/13 309 lb  8 oz (140.388 kg)  03/03/12 337 lb 4 oz (152.976 kg)    General: Appears his stated age, well developed, well nourished in NAD. Skin: Nickel sized ulcerated lesion noted posterior to the ear. A semicircular portion which is scabbed over and two smaller punctures that appear to be filled with pus, surrounded by an area of erythema and cellulitis. HEENT: Head: normal shape and size; Eyes: sclera white, no icterus, conjunctiva pink, PERRLA and EOMs intact; Ears: Tm's gray and intact, normal light reflex; Nose: mucosa pink and moist, septum midline; Throat/Mouth: Teeth present, mucosa pink and moist, no exudate, lesions or ulcerations noted.  Cardiovascular: Normal rate and rhythm. S1,S2 noted.  No murmur, rubs or gallops noted. No JVD or BLE edema. No carotid bruits noted. Pulmonary/Chest: Normal effort and positive vesicular breath sounds. No respiratory distress. No wheezes, rales or ronchi noted.  Neurological: Alert and oriented. Cranial nerves II-XII intact. Coordination normal. +DTRs bilaterally.       Assessment & Plan:   Cellulitis, secondary to viral infection of the skin, new onset with additional workup required:  Do not touch the affected area eRx for Levaquin x 7 days   RTC as needed or if symptoms persist

## 2013-03-30 ENCOUNTER — Other Ambulatory Visit: Payer: Self-pay

## 2013-03-30 MED ORDER — POTASSIUM CHLORIDE CRYS ER 20 MEQ PO TBCR: 40.0000 meq | EXTENDED_RELEASE_TABLET | Freq: Every day | ORAL | Status: DC

## 2013-05-21 ENCOUNTER — Other Ambulatory Visit: Payer: Self-pay | Admitting: Internal Medicine

## 2013-05-22 ENCOUNTER — Other Ambulatory Visit: Payer: Self-pay

## 2013-05-22 MED ORDER — LEVOTHYROXINE SODIUM 25 MCG PO TABS: 25.0000 ug | ORAL_TABLET | Freq: Every day | ORAL | Status: DC

## 2013-07-13 ENCOUNTER — Other Ambulatory Visit (INDEPENDENT_AMBULATORY_CARE_PROVIDER_SITE_OTHER): Payer: BC Managed Care – PPO

## 2013-07-13 ENCOUNTER — Encounter: Payer: Self-pay | Admitting: Internal Medicine

## 2013-07-13 ENCOUNTER — Ambulatory Visit (INDEPENDENT_AMBULATORY_CARE_PROVIDER_SITE_OTHER): Payer: BC Managed Care – PPO | Admitting: Internal Medicine

## 2013-07-13 VITALS — BP 110/68 | HR 61 | Temp 98.4°F | Ht 72.0 in | Wt 315.2 lb

## 2013-07-13 DIAGNOSIS — Z0000 Encounter for general adult medical examination without abnormal findings: Secondary | ICD-10-CM

## 2013-07-13 DIAGNOSIS — E119 Type 2 diabetes mellitus without complications: Secondary | ICD-10-CM

## 2013-07-13 LAB — HEPATIC FUNCTION PANEL
ALT: 34 U/L (ref 0–53)
AST: 27 U/L (ref 0–37)
Albumin: 4 g/dL (ref 3.5–5.2)
Alkaline Phosphatase: 48 U/L (ref 39–117)
Total Protein: 7.1 g/dL (ref 6.0–8.3)

## 2013-07-13 LAB — BASIC METABOLIC PANEL
BUN: 24 mg/dL — ABNORMAL HIGH (ref 6–23)
CO2: 32 mEq/L (ref 19–32)
Chloride: 105 mEq/L (ref 96–112)
Glucose, Bld: 99 mg/dL (ref 70–99)
Potassium: 4.3 mEq/L (ref 3.5–5.1)

## 2013-07-13 LAB — HEMOGLOBIN A1C: Hgb A1c MFr Bld: 6.4 % (ref 4.6–6.5)

## 2013-07-13 LAB — CBC WITH DIFFERENTIAL/PLATELET
Basophils Relative: 0.3 % (ref 0.0–3.0)
Eosinophils Absolute: 0.5 10*3/uL (ref 0.0–0.7)
Hemoglobin: 14.6 g/dL (ref 13.0–17.0)
MCHC: 33.5 g/dL (ref 30.0–36.0)
MCV: 88.4 fl (ref 78.0–100.0)
Monocytes Absolute: 0.7 10*3/uL (ref 0.1–1.0)
Neutro Abs: 5.2 10*3/uL (ref 1.4–7.7)
RBC: 4.94 Mil/uL (ref 4.22–5.81)
RDW: 14.1 % (ref 11.5–14.6)

## 2013-07-13 LAB — LIPID PANEL
Cholesterol: 161 mg/dL (ref 0–200)
VLDL: 36.8 mg/dL (ref 0.0–40.0)

## 2013-07-13 LAB — PSA: PSA: 0.09 ng/mL — ABNORMAL LOW (ref 0.10–4.00)

## 2013-07-13 MED ORDER — LEVOTHYROXINE SODIUM 25 MCG PO TABS: 25.0000 ug | ORAL_TABLET | Freq: Every day | ORAL | Status: DC

## 2013-07-13 MED ORDER — ALLOPURINOL 300 MG PO TABS: 300.0000 mg | ORAL_TABLET | Freq: Every day | ORAL | Status: DC

## 2013-07-13 MED ORDER — SIMVASTATIN 80 MG PO TABS: 80.0000 mg | ORAL_TABLET | Freq: Every day | ORAL | Status: DC

## 2013-07-13 MED ORDER — LISINOPRIL 20 MG PO TABS: 20.0000 mg | ORAL_TABLET | Freq: Every day | ORAL | Status: DC

## 2013-07-13 MED ORDER — DICYCLOMINE HCL 20 MG PO TABS: ORAL_TABLET | ORAL | Status: DC

## 2013-07-13 MED ORDER — PIOGLITAZONE HCL-METFORMIN HCL 15-500 MG PO TABS: 1.0000 | ORAL_TABLET | Freq: Every day | ORAL | Status: DC

## 2013-07-13 MED ORDER — POTASSIUM CHLORIDE CRYS ER 20 MEQ PO TBCR: 40.0000 meq | EXTENDED_RELEASE_TABLET | Freq: Every day | ORAL | Status: DC

## 2013-07-13 MED ORDER — ATENOLOL-CHLORTHALIDONE 50-25 MG PO TABS: ORAL_TABLET | ORAL | Status: DC

## 2013-07-13 NOTE — Patient Instructions (Signed)
Please continue all other medications as before, and refills have been done if requested. Please have the pharmacy call with any other refills you may need.  Please continue your efforts at being more active, low cholesterol diet, and weight control.  You are otherwise up to date with prevention measures today.  Please go to the LAB in the Basement (turn left off the elevator) for the tests to be done today You will be contacted by phone if any changes need to be made immediately.  Otherwise, you will receive a letter about your results with an explanation, but please check with MyChart first.  Please remember to sign up for MyChart if you have not done so, as this will be important to you in the future with finding out test results, communicating by private email, and scheduling acute appointments online when needed.  Please return in 6 months, or sooner if needed, with Lab testing done 3-5 days before  

## 2013-07-13 NOTE — Addendum Note (Signed)
Addended by: Scharlene Gloss B on: 07/13/2013 04:04 PM   Modules accepted: Orders

## 2013-07-13 NOTE — Assessment & Plan Note (Signed)
stable overall by history and exam, recent data reviewed with pt, and pt to continue medical treatment as before,  to f/u any worsening symptoms or concerns Lab Results  Component Value Date   HGBA1C 6.9* 02/27/2012

## 2013-07-13 NOTE — Progress Notes (Signed)
Subjective:    Patient ID: Leon Nelson, male    DOB: 01-26-1956, 57 y.o.   MRN: [id number]  HPI  Here for wellness and f/u;  Overall doing ok;  Pt denies CP, worsening SOB, DOE, wheezing, orthopnea, PND, worsening LE edema, palpitations, dizziness or syncope.  Pt denies neurological change such as new headache, facial or extremity weakness.  Pt denies polydipsia, polyuria, or low sugar symptoms. Pt states overall good compliance with treatment and medications, good tolerability, and has been trying to follow lower cholesterol diet.  Pt denies worsening depressive symptoms, suicidal ideation or panic. No fever, night sweats, wt loss, loss of appetite, or other constitutional symptoms.  Pt states good ability with ADL's, has low fall risk, home safety reviewed and adequate, no other significant changes in hearing or vision, and only occasionally active with exercise.  Lost to fu due to lost insurance last yr. 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   Past Medical History  Diagnosis Date  . ABDOMINAL PAIN OTHER SPECIFIED SITE 07/20/2008  . CHEST PAIN 02/06/2011  . COLONIC POLYPS, HX OF 05/26/2008  . DEPRESSION 05/26/2008  . DIABETES MELLITUS, TYPE II 05/26/2008  . HYPERLIPIDEMIA 05/26/2008  . HYPERTENSION 05/26/2008  . HYPOTHYROIDISM 05/26/2008  . KNEE PAIN, RIGHT, ACUTE 07/01/2008  . Personal history of urinary calculi 05/26/2008  . UTI'S, HX OF 05/26/2008   Past Surgical History  Procedure Laterality Date  . Tonsillectomy    . S/p right knee arthroplasty       complicated by patellar tendon rupture Jan 2011 Dr. Darrelyn Hillock    reports that he has never smoked. He does not have any smokeless tobacco history on file. He reports that he does not drink alcohol or use illicit drugs. family history includes Alcohol abuse in his other; Cancer in his mother; Diabetes in his other; Heart disease in his father and mother; and Hyperlipidemia in his father and  mother. Allergies  Allergen Reactions  . Penicillins   . Sulfonamide Derivatives    Current Outpatient Prescriptions on File Prior to Visit  Medication Sig Dispense Refill  . sildenafil (VIAGRA) 100 MG tablet Take 50-100 mg by mouth daily as needed.       No current facility-administered medications on file prior to visit.     Review of Systems Constitutional: Negative for diaphoresis, activity change, appetite change or unexpected weight change.  HENT: Negative for hearing loss, ear pain, facial swelling, mouth sores and neck stiffness.   Eyes: Negative for pain, redness and visual disturbance.  Respiratory: Negative for shortness of breath and wheezing.   Cardiovascular: Negative for chest pain and palpitations.  Gastrointestinal: Negative for diarrhea, blood in stool, abdominal distention or other pain Genitourinary: Negative for hematuria, flank pain or change in urine volume.  Musculoskeletal: Negative for myalgias and joint swelling.  Skin: Negative for color change and wound.  Neurological: Negative for syncope and numbness. other than noted Hematological: Negative for adenopathy.  Psychiatric/Behavioral: Negative for hallucinations, self-injury, decreased concentration and agitation.      Objective:   Physical Exam BP 110/68  Pulse 61  Temp(Src) 98.4 F (36.9 C) (Oral)  Ht 6' (1.829 m)  Wt 315 lb 4 oz (142.996 kg)  BMI 42.75 kg/m2  SpO2 96% VS noted,  Constitutional: Pt is oriented to person, place, and time. Appears well-developed and well-nourished.  Head: Normocephalic and atraumatic.  Right Ear: External ear normal.  Left Ear: External ear normal.  Nose: Nose normal.  Mouth/Throat: Oropharynx is clear and moist.  Eyes: Conjunctivae and EOM are normal. Pupils are equal, round, and reactive to light.  Neck: Normal range of motion. Neck supple. No JVD present. No tracheal deviation present.  Cardiovascular: Normal rate, regular rhythm, normal heart sounds and  intact distal pulses.   Pulmonary/Chest: Effort normal and breath sounds normal.  Abdominal: Soft. Bowel sounds are normal. There is no tenderness. No HSM  Musculoskeletal: Normal range of motion. Exhibits no edema.  Lymphadenopathy:  Has no cervical adenopathy.  Neurological: Pt is alert and oriented to person, place, and time. Pt has normal reflexes. No cranial nerve deficit.  Skin: Skin is warm and dry. No rash noted.  Psychiatric:  Has  normal mood and affect. Behavior is normal.      Assessment & Plan:

## 2013-07-13 NOTE — Assessment & Plan Note (Signed)

## 2013-07-14 ENCOUNTER — Encounter: Payer: Self-pay | Admitting: Internal Medicine

## 2013-07-14 LAB — URINALYSIS, ROUTINE W REFLEX MICROSCOPIC
Bilirubin Urine: NEGATIVE
Hgb urine dipstick: NEGATIVE
Ketones, ur: NEGATIVE
Total Protein, Urine: NEGATIVE
Urine Glucose: NEGATIVE

## 2013-07-17 ENCOUNTER — Other Ambulatory Visit: Payer: Self-pay | Admitting: *Deleted

## 2013-07-17 MED ORDER — VENLAFAXINE HCL ER 75 MG PO CP24: 75.0000 mg | ORAL_CAPSULE | Freq: Every day | ORAL | Status: DC

## 2013-07-20 ENCOUNTER — Telehealth: Payer: Self-pay

## 2013-07-20 MED ORDER — VENLAFAXINE HCL ER 75 MG PO CP24: 75.0000 mg | ORAL_CAPSULE | Freq: Every day | ORAL | Status: DC

## 2013-07-20 NOTE — Telephone Encounter (Signed)
refill 

## 2014-01-18 ENCOUNTER — Other Ambulatory Visit (INDEPENDENT_AMBULATORY_CARE_PROVIDER_SITE_OTHER): Payer: BC Managed Care – PPO

## 2014-01-18 DIAGNOSIS — E119 Type 2 diabetes mellitus without complications: Secondary | ICD-10-CM

## 2014-01-18 LAB — BASIC METABOLIC PANEL
BUN: 20 mg/dL (ref 6–23)
CHLORIDE: 104 meq/L (ref 96–112)
CO2: 29 mEq/L (ref 19–32)
CREATININE: 0.7 mg/dL (ref 0.4–1.5)
Calcium: 9.5 mg/dL (ref 8.4–10.5)
GFR: 118.72 mL/min (ref >60.00)
Glucose, Bld: 141 mg/dL — ABNORMAL HIGH (ref 70–99)
POTASSIUM: 3.9 meq/L (ref 3.5–5.1)
Sodium: 138 mEq/L (ref 135–145)

## 2014-01-18 LAB — HEPATIC FUNCTION PANEL
ALT: 22 U/L (ref 0–53)
AST: 20 U/L (ref 0–37)
Albumin: 3.7 g/dL (ref 3.5–5.2)
Alkaline Phosphatase: 50 U/L (ref 39–117)
BILIRUBIN DIRECT: 0 mg/dL (ref 0.0–0.3)
BILIRUBIN TOTAL: 0.4 mg/dL (ref 0.3–1.2)
Total Protein: 6.7 g/dL (ref 6.0–8.3)

## 2014-01-18 LAB — HEMOGLOBIN A1C: Hgb A1c MFr Bld: 6.5 % (ref 4.6–6.5)

## 2014-01-18 LAB — LIPID PANEL
CHOL/HDL RATIO: 4
Cholesterol: 131 mg/dL (ref 0–200)
HDL: 36.4 mg/dL — ABNORMAL LOW (ref >39.00)
LDL CALC: 71 mg/dL (ref 0–99)
Triglycerides: 118 mg/dL (ref 0.0–149.0)
VLDL: 23.6 mg/dL (ref 0.0–40.0)

## 2014-01-20 ENCOUNTER — Ambulatory Visit (INDEPENDENT_AMBULATORY_CARE_PROVIDER_SITE_OTHER): Payer: BC Managed Care – PPO | Admitting: Internal Medicine

## 2014-01-20 ENCOUNTER — Encounter: Payer: Self-pay | Admitting: Internal Medicine

## 2014-01-20 VITALS — BP 112/80 | HR 71 | Temp 98.6°F | Ht 72.0 in | Wt 323.0 lb

## 2014-01-20 DIAGNOSIS — Z23 Encounter for immunization: Secondary | ICD-10-CM

## 2014-01-20 DIAGNOSIS — Z0000 Encounter for general adult medical examination without abnormal findings: Secondary | ICD-10-CM

## 2014-01-20 DIAGNOSIS — E119 Type 2 diabetes mellitus without complications: Secondary | ICD-10-CM

## 2014-01-20 DIAGNOSIS — E785 Hyperlipidemia, unspecified: Secondary | ICD-10-CM

## 2014-01-20 DIAGNOSIS — I10 Essential (primary) hypertension: Secondary | ICD-10-CM

## 2014-01-20 MED ORDER — DICYCLOMINE HCL 20 MG PO TABS: ORAL_TABLET | ORAL | Status: DC

## 2014-01-20 MED ORDER — POTASSIUM CHLORIDE CRYS ER 20 MEQ PO TBCR: 40.0000 meq | EXTENDED_RELEASE_TABLET | Freq: Every day | ORAL | Status: DC

## 2014-01-20 MED ORDER — LEVOTHYROXINE SODIUM 25 MCG PO TABS: 25.0000 ug | ORAL_TABLET | Freq: Every day | ORAL | Status: DC

## 2014-01-20 MED ORDER — ATENOLOL-CHLORTHALIDONE 50-25 MG PO TABS: ORAL_TABLET | ORAL | Status: DC

## 2014-01-20 MED ORDER — ALLOPURINOL 300 MG PO TABS: 300.0000 mg | ORAL_TABLET | Freq: Every day | ORAL | Status: DC

## 2014-01-20 MED ORDER — VENLAFAXINE HCL ER 75 MG PO CP24: 75.0000 mg | ORAL_CAPSULE | Freq: Every day | ORAL | Status: DC

## 2014-01-20 MED ORDER — PIOGLITAZONE HCL-METFORMIN HCL 15-500 MG PO TABS: 1.0000 | ORAL_TABLET | Freq: Every day | ORAL | Status: DC

## 2014-01-20 MED ORDER — LISINOPRIL 20 MG PO TABS: 20.0000 mg | ORAL_TABLET | Freq: Every day | ORAL | Status: DC

## 2014-01-20 MED ORDER — SIMVASTATIN 80 MG PO TABS: 80.0000 mg | ORAL_TABLET | Freq: Every day | ORAL | Status: DC

## 2014-01-20 NOTE — Assessment & Plan Note (Signed)
stable overall by history and exam, recent data reviewed with pt, and pt to continue medical treatment as before,  to f/u any worsening symptoms or concerns Lab Results  Component Value Date   LDLCALC 71 01/18/2014

## 2014-01-20 NOTE — Progress Notes (Signed)
Pre-visit discussion using our clinic review tool. No additional management support is needed unless otherwise documented below in the visit note.  

## 2014-01-20 NOTE — Patient Instructions (Signed)
Please continue all other medications as before, and refills have been done if requested. Please have the pharmacy call with any other refills you may need. Please continue your efforts at being more active, low cholesterol diet, and weight control.  Please see Dr Smith/Sport medicine if your right knee gets worse  Please return in 6 months, or sooner if needed, with Lab testing done 3-5 days before

## 2014-01-20 NOTE — Progress Notes (Signed)
Subjective:    Patient ID: Leon Nelson, male    DOB: 12/31/1955, 58 y.o.   MRN: [id number]  HPI  Here to f/u; overall doing ok,  Pt denies chest pain, increased sob or doe, wheezing, orthopnea, PND, increased LE swelling, palpitations, dizziness or syncope.  Pt denies polydipsia, polyuria, or low sugar symptoms such as weakness or confusion improved with po intake.  Pt denies new neurological symptoms such as new headache, or facial or extremity weakness or numbness.   Pt states overall good compliance with meds, has been trying to follow lower cholesterol, diabetic diet, with wt overall stable,  but little exercise however.  Now back to work in a similar position as before when lost job in 2012, some stress but manageable. 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  Did also have right knee patellar tendon pain with the walking, now improved with less walking at new job, spends abou 50% sitting. Past Medical History  Diagnosis Date  . ABDOMINAL PAIN OTHER SPECIFIED SITE 07/20/2008  . CHEST PAIN 02/06/2011  . COLONIC POLYPS, HX OF 05/26/2008  . DEPRESSION 05/26/2008  . DIABETES MELLITUS, TYPE II 05/26/2008  . HYPERLIPIDEMIA 05/26/2008  . HYPERTENSION 05/26/2008  . HYPOTHYROIDISM 05/26/2008  . KNEE PAIN, RIGHT, ACUTE 07/01/2008  . Personal history of urinary calculi 05/26/2008  . UTI'S, HX OF 05/26/2008   Past Surgical History  Procedure Laterality Date  . Tonsillectomy    . S/p right knee arthroplasty       complicated by patellar tendon rupture Jan 2011 Dr. Gladstone Lighter    reports that he has never smoked. He does not have any smokeless tobacco history on file. He reports that he does not drink alcohol or use illicit drugs. family history includes Alcohol abuse in his other; Cancer in his mother; Diabetes in his other; Heart disease in his father and mother; Hyperlipidemia in his father and mother. Allergies    Allergen Reactions  . Penicillins   . Sulfonamide Derivatives    Current Outpatient Prescriptions on File Prior to Visit  Medication Sig Dispense Refill  . sildenafil (VIAGRA) 100 MG tablet Take 50-100 mg by mouth daily as needed.       No current facility-administered medications on file prior to visit.     Review of Systems  Constitutional: Negative for unexpected weight change, or unusual diaphoresis  HENT: Negative for tinnitus.   Eyes: Negative for photophobia and visual disturbance.  Respiratory: Negative for choking and stridor.   Gastrointestinal: Negative for vomiting and blood in stool.  Genitourinary: Negative for hematuria and decreased urine volume.  Musculoskeletal: Negative for acute joint swelling Skin: Negative for color change and wound.  Neurological: Negative for tremors and numbness other than noted  Psychiatric/Behavioral: Negative for decreased concentration or  hyperactivity.       Objective:   Physical Exam BP 112/80  Pulse 71  Temp(Src) 98.6 F (37 C) (Oral)  Ht 6' (1.829 m)  Wt 323 lb (146.512 kg)  BMI 43.80 kg/m2  SpO2 95% VS noted,  Constitutional: Pt appears well-developed and well-nourished.  HENT: Head: NCAT.  Right Ear: External ear normal.  Left Ear: External ear normal.  Eyes: Conjunctivae and EOM are normal. Pupils are equal, round, and reactive to light.  Neck: Normal range of motion. Neck supple.  Cardiovascular: Normal rate and regular rhythm.   Pulmonary/Chest: Effort normal and breath sounds normal.  Neurological: Pt is alert. Not confused  Skin: Skin is warm. No erythema.  Psychiatric: Pt behavior is normal. Thought content normal.      Assessment & Plan:

## 2014-01-20 NOTE — Assessment & Plan Note (Signed)
stable overall by history and exam, recent data reviewed with pt, and pt to continue medical treatment as before,  to f/u any worsening symptoms or concerns Lab Results  Component Value Date   HGBA1C 6.5 01/18/2014

## 2014-01-20 NOTE — Assessment & Plan Note (Signed)
stable overall by history and exam, recent data reviewed with pt, and pt to continue medical treatment as before,  to f/u any worsening symptoms or concerns BP Readings from Last 3 Encounters:  01/20/14 112/80  07/13/13 110/68  02/11/13 122/78

## 2014-01-21 ENCOUNTER — Telehealth: Payer: Self-pay | Admitting: Internal Medicine

## 2014-01-21 NOTE — Telephone Encounter (Signed)
Relevant patient education assigned to patient using Emmi. ° °

## 2014-01-22 ENCOUNTER — Telehealth: Payer: Self-pay

## 2014-01-22 NOTE — Telephone Encounter (Signed)
Relevant patient education assigned to patient using Emmi. ° °

## 2014-07-15 ENCOUNTER — Other Ambulatory Visit: Payer: Self-pay | Admitting: Internal Medicine

## 2014-07-19 ENCOUNTER — Other Ambulatory Visit (INDEPENDENT_AMBULATORY_CARE_PROVIDER_SITE_OTHER): Payer: BC Managed Care – PPO

## 2014-07-19 DIAGNOSIS — E119 Type 2 diabetes mellitus without complications: Secondary | ICD-10-CM

## 2014-07-19 DIAGNOSIS — Z0000 Encounter for general adult medical examination without abnormal findings: Secondary | ICD-10-CM

## 2014-07-19 LAB — HEPATIC FUNCTION PANEL
ALT: 30 U/L (ref 0–53)
AST: 27 U/L (ref 0–37)
Albumin: 3.9 g/dL (ref 3.5–5.2)
Alkaline Phosphatase: 46 U/L (ref 39–117)
Bilirubin, Direct: 0.1 mg/dL (ref 0.0–0.3)
TOTAL PROTEIN: 6.5 g/dL (ref 6.0–8.3)
Total Bilirubin: 0.6 mg/dL (ref 0.2–1.2)

## 2014-07-19 LAB — URINALYSIS, ROUTINE W REFLEX MICROSCOPIC
BILIRUBIN URINE: NEGATIVE
HGB URINE DIPSTICK: NEGATIVE
Ketones, ur: NEGATIVE
Leukocytes, UA: NEGATIVE
NITRITE: NEGATIVE
RBC / HPF: NONE SEEN (ref 0–?)
Specific Gravity, Urine: 1.025 (ref 1.000–1.030)
TOTAL PROTEIN, URINE-UPE24: NEGATIVE
Urine Glucose: NEGATIVE
Urobilinogen, UA: 0.2 (ref 0.0–1.0)
pH: 5.5 (ref 5.0–8.0)

## 2014-07-19 LAB — CBC WITH DIFFERENTIAL/PLATELET
BASOS ABS: 0 10*3/uL (ref 0.0–0.1)
Basophils Relative: 0.5 % (ref 0.0–3.0)
EOS PCT: 5.4 % — AB (ref 0.0–5.0)
Eosinophils Absolute: 0.4 10*3/uL (ref 0.0–0.7)
HCT: 42.3 % (ref 39.0–52.0)
Hemoglobin: 14 g/dL (ref 13.0–17.0)
Lymphocytes Relative: 29.1 % (ref 12.0–46.0)
Lymphs Abs: 2.2 10*3/uL (ref 0.7–4.0)
MCHC: 33.2 g/dL (ref 30.0–36.0)
MCV: 89.3 fl (ref 78.0–100.0)
MONO ABS: 0.5 10*3/uL (ref 0.1–1.0)
Monocytes Relative: 6.7 % (ref 3.0–12.0)
NEUTROS PCT: 58.3 % (ref 43.0–77.0)
Neutro Abs: 4.4 10*3/uL (ref 1.4–7.7)
PLATELETS: 213 10*3/uL (ref 150.0–400.0)
RBC: 4.73 Mil/uL (ref 4.22–5.81)
RDW: 13.4 % (ref 11.5–15.5)
WBC: 7.6 10*3/uL (ref 4.0–10.5)

## 2014-07-19 LAB — BASIC METABOLIC PANEL
BUN: 22 mg/dL (ref 6–23)
CALCIUM: 9.1 mg/dL (ref 8.4–10.5)
CO2: 27 mEq/L (ref 19–32)
Chloride: 105 mEq/L (ref 96–112)
Creatinine, Ser: 0.8 mg/dL (ref 0.4–1.5)
GFR: 104.95 mL/min (ref >60.00)
Glucose, Bld: 159 mg/dL — ABNORMAL HIGH (ref 70–99)
POTASSIUM: 4.1 meq/L (ref 3.5–5.1)
SODIUM: 140 meq/L (ref 135–145)

## 2014-07-19 LAB — TSH: TSH: 1.83 u[IU]/mL (ref 0.35–4.50)

## 2014-07-19 LAB — MICROALBUMIN / CREATININE URINE RATIO
CREATININE, U: 282.7 mg/dL
Microalb Creat Ratio: 0.4 mg/g (ref 0.0–30.0)
Microalb, Ur: 1.1 mg/dL (ref 0.0–1.9)

## 2014-07-19 LAB — LIPID PANEL
CHOL/HDL RATIO: 3
Cholesterol: 94 mg/dL (ref 0–200)
HDL: 28.8 mg/dL — ABNORMAL LOW (ref >39.00)
LDL CALC: 49 mg/dL (ref 0–99)
NonHDL: 65.2
Triglycerides: 83 mg/dL (ref 0.0–149.0)
VLDL: 16.6 mg/dL (ref 0.0–40.0)

## 2014-07-19 LAB — PSA: PSA: 0.09 ng/mL — ABNORMAL LOW (ref 0.10–4.00)

## 2014-07-19 LAB — HEMOGLOBIN A1C: Hgb A1c MFr Bld: 6.3 % (ref 4.6–6.5)

## 2014-07-21 ENCOUNTER — Ambulatory Visit (INDEPENDENT_AMBULATORY_CARE_PROVIDER_SITE_OTHER): Payer: BC Managed Care – PPO | Admitting: Internal Medicine

## 2014-07-21 VITALS — BP 106/70 | HR 60 | Temp 98.7°F | Ht 72.0 in | Wt 316.4 lb

## 2014-07-21 DIAGNOSIS — Z23 Encounter for immunization: Secondary | ICD-10-CM

## 2014-07-21 DIAGNOSIS — Z0000 Encounter for general adult medical examination without abnormal findings: Secondary | ICD-10-CM

## 2014-07-21 DIAGNOSIS — E119 Type 2 diabetes mellitus without complications: Secondary | ICD-10-CM

## 2014-07-21 MED ORDER — METFORMIN HCL ER 500 MG PO TB24: 500.0000 mg | ORAL_TABLET | Freq: Every day | ORAL | Status: DC

## 2014-07-21 NOTE — Patient Instructions (Addendum)
You had the Pneumovax pnuemonia shot today  OK to stop the actosplus met  Please take all new medication as prescribed - the metformin ER 500 mg in the AM  Please continue all other medications as before, and refills have been done if requested.  Please have the pharmacy call with any other refills you may need.  Please continue your efforts at being more active, low cholesterol diet, and weight control.  You are otherwise up to date with prevention measures today.  Please keep your appointments with your specialists as you may have planned  Please remember to sign up for MyChart if you have not done so, as this will be important to you in the future with finding out test results, communicating by private email, and scheduling acute appointments online when needed.  Please return in 6 months, or sooner if needed, with Lab testing done 3-5 days before

## 2014-07-21 NOTE — Progress Notes (Signed)
Pre visit review using our clinic review tool, if applicable. No additional management support is needed unless otherwise documented below in the visit note. 

## 2014-07-21 NOTE — Assessment & Plan Note (Signed)
Ok to decr the actos plus met to metformin only due to recent wt loss and anticipated further wt loss

## 2014-07-21 NOTE — Progress Notes (Signed)
Subjective:    Patient ID: Leon Nelson, male    DOB: [birth year], 58 y.o.   MRN: [id number]  HPI  Here for wellness and f/u;  Overall doing ok;  Pt denies CP, worsening SOB, DOE, wheezing, orthopnea, PND, worsening LE edema, palpitations, dizziness or syncope.  Pt denies neurological change such as new headache, facial or extremity weakness.  Pt denies polydipsia, polyuria, or low sugar symptoms. Pt states overall good compliance with treatment and medications, good tolerability, and has been trying to follow lower cholesterol diet.  Pt denies worsening depressive symptoms, suicidal ideation or panic. No fever, night sweats, loss of appetite, or other constitutional symptoms, though has lost 21 lbs with better diet recently.   Pt states good ability with ADL's, has low fall risk, home safety reviewed and adequate, no other significant changes in hearing or vision, and only occasionally active with exercise.   Past Medical History  Diagnosis Date  . ABDOMINAL PAIN OTHER SPECIFIED SITE 07/20/2008  . CHEST PAIN 02/06/2011  . COLONIC POLYPS, HX OF 05/26/2008  . DEPRESSION 05/26/2008  . DIABETES MELLITUS, TYPE II 05/26/2008  . HYPERLIPIDEMIA 05/26/2008  . HYPERTENSION 05/26/2008  . HYPOTHYROIDISM 05/26/2008  . KNEE PAIN, RIGHT, ACUTE 07/01/2008  . Personal history of urinary calculi 05/26/2008  . UTI'S, HX OF 05/26/2008   Past Surgical History  Procedure Laterality Date  . Tonsillectomy    . S/p right knee arthroplasty       complicated by patellar tendon rupture Jan 2011 Dr. Gladstone Lighter    reports that he has never smoked. He does not have any smokeless tobacco history on file. He reports that he does not drink alcohol or use illicit drugs. family history includes Alcohol abuse in his other; Cancer in his mother; Diabetes in his other; Heart disease in his father and mother; Hyperlipidemia in his father and mother. Allergies  Allergen Reactions  . Penicillins   . Sulfonamide Derivatives    Current  Outpatient Prescriptions on File Prior to Visit  Medication Sig Dispense Refill  . allopurinol (ZYLOPRIM) 300 MG tablet TAKE ONE TABLET BY MOUTH ONCE DAILY  90 tablet  2  . atenolol-chlorthalidone (TENORETIC) 50-25 MG per tablet TAKE ONE-HALF TABLET BY MOUTH ONCE DAILY  45 tablet  2  . dicyclomine (BENTYL) 20 MG tablet TAKE ONE TABLET BY MOUTH THREE TIMES DAILY AS NEEDED  180 tablet  3  . levothyroxine (SYNTHROID, LEVOTHROID) 25 MCG tablet Take 1 tablet (25 mcg total) by mouth daily.  90 tablet  3  . lisinopril (PRINIVIL,ZESTRIL) 20 MG tablet Take 1 tablet (20 mg total) by mouth daily.  90 tablet  3  . potassium chloride SA (K-DUR,KLOR-CON) 20 MEQ tablet Take 2 tablets (40 mEq total) by mouth daily.  180 tablet  3  . sildenafil (VIAGRA) 100 MG tablet Take 50-100 mg by mouth daily as needed.      . simvastatin (ZOCOR) 80 MG tablet Take 1 tablet (80 mg total) by mouth at bedtime.  90 tablet  3  . venlafaxine XR (EFFEXOR-XR) 75 MG 24 hr capsule Take 1 capsule (75 mg total) by mouth daily.  90 capsule  3   No current facility-administered medications on file prior to visit.   Review of Systems Constitutional: Negative for increased diaphoresis, other activity, appetite or other siginficant weight change  HENT: Negative for worsening hearing loss, ear pain, facial swelling, mouth sores and neck stiffness.   Eyes: Negative for other worsening pain, redness or visual disturbance.  Respiratory: Negative for shortness of breath and wheezing.   Cardiovascular: Negative for chest pain and palpitations.  Gastrointestinal: Negative for diarrhea, blood in stool, abdominal distention or other pain Genitourinary: Negative for hematuria, flank pain or change in urine volume.  Musculoskeletal: Negative for myalgias or other joint complaints.  Skin: Negative for color change and wound.  Neurological: Negative for syncope and numbness. other than noted Hematological: Negative for adenopathy. or other  swelling Psychiatric/Behavioral: Negative for hallucinations, self-injury, decreased concentration or other worsening agitation.      Objective:   Physical Exam BP 106/70  Pulse 60  Temp(Src) 98.7 F (37.1 C) (Oral)  Ht 6' (1.829 m)  Wt 316 lb 6 oz (143.507 kg)  BMI 42.90 kg/m2  SpO2 97% VS noted,  Constitutional: Pt is oriented to person, place, and time. Appears well-developed and well-nourished.  Head: Normocephalic and atraumatic.  Right Ear: External ear normal.  Left Ear: External ear normal.  Nose: Nose normal.  Mouth/Throat: Oropharynx is clear and moist.  Eyes: Conjunctivae and EOM are normal. Pupils are equal, round, and reactive to light.  Neck: Normal range of motion. Neck supple. No JVD present. No tracheal deviation present.  Cardiovascular: Normal rate, regular rhythm, normal heart sounds and intact distal pulses.   Pulmonary/Chest: Effort normal and breath sounds without rales or wheezing  Abdominal: Soft. Bowel sounds are normal. NT. No HSM  Musculoskeletal: Normal range of motion. Exhibits no edema.  Lymphadenopathy:  Has no cervical adenopathy.  Neurological: Pt is alert and oriented to person, place, and time. Pt has normal reflexes. No cranial nerve deficit. Motor grossly intact Skin: Skin is warm and dry. No rash noted.  Psychiatric:  Has normal mood and affect. Behavior is normal.     Assessment & Plan:

## 2014-07-23 ENCOUNTER — Encounter: Payer: Self-pay | Admitting: Internal Medicine

## 2014-07-23 NOTE — Assessment & Plan Note (Signed)

## 2014-09-17 ENCOUNTER — Other Ambulatory Visit: Payer: Self-pay | Admitting: Internal Medicine

## 2014-12-02 ENCOUNTER — Other Ambulatory Visit: Payer: Self-pay | Admitting: Internal Medicine

## 2014-12-28 ENCOUNTER — Telehealth: Payer: Self-pay | Admitting: Internal Medicine

## 2014-12-28 MED ORDER — POTASSIUM CHLORIDE CRYS ER 20 MEQ PO TBCR: 40.0000 meq | EXTENDED_RELEASE_TABLET | Freq: Every day | ORAL | Status: DC

## 2014-12-28 NOTE — Telephone Encounter (Signed)
Pt called in said that he needs to see if we could call in 10 pills of potassium chloride SA (K-DUR,KLOR-CON) 20 MEQ tablet   To [facility] pharmacy, he needs just enough to make it til his mail order comes in .

## 2014-12-28 NOTE — Telephone Encounter (Signed)
Notified pt med sent to [place]...Leon Nelson

## 2014-12-30 ENCOUNTER — Other Ambulatory Visit: Payer: Self-pay

## 2014-12-30 MED ORDER — ATENOLOL-CHLORTHALIDONE 50-25 MG PO TABS: 0.5000 | ORAL_TABLET | Freq: Every day | ORAL | Status: DC

## 2015-01-26 ENCOUNTER — Ambulatory Visit: Payer: BC Managed Care – PPO | Admitting: Internal Medicine

## 2015-01-28 ENCOUNTER — Telehealth: Payer: Self-pay | Admitting: Internal Medicine

## 2015-01-28 NOTE — Telephone Encounter (Signed)
Patient no showed for fu on 2/3.  Please advise.

## 2015-01-29 NOTE — Telephone Encounter (Signed)
[place] for [employer] at his convenience

## 2015-01-31 NOTE — Telephone Encounter (Signed)
Tried to contact.  No vm.

## 2015-03-24 ENCOUNTER — Other Ambulatory Visit: Payer: Self-pay | Admitting: Internal Medicine

## 2015-03-27 ENCOUNTER — Other Ambulatory Visit: Payer: Self-pay | Admitting: Internal Medicine

## 2015-06-02 ENCOUNTER — Other Ambulatory Visit: Payer: Self-pay | Admitting: Internal Medicine

## 2015-08-04 ENCOUNTER — Other Ambulatory Visit: Payer: Self-pay | Admitting: Internal Medicine

## 2015-09-13 ENCOUNTER — Ambulatory Visit (INDEPENDENT_AMBULATORY_CARE_PROVIDER_SITE_OTHER): Payer: BLUE CROSS/BLUE SHIELD | Admitting: Internal Medicine

## 2015-09-13 ENCOUNTER — Encounter: Payer: Self-pay | Admitting: Internal Medicine

## 2015-09-13 VITALS — BP 118/76 | HR 58 | Temp 98.4°F | Ht 72.0 in | Wt 321.0 lb

## 2015-09-13 DIAGNOSIS — M542 Cervicalgia: Secondary | ICD-10-CM | POA: Diagnosis not present

## 2015-09-13 DIAGNOSIS — I10 Essential (primary) hypertension: Secondary | ICD-10-CM | POA: Diagnosis not present

## 2015-09-13 DIAGNOSIS — Z0000 Encounter for general adult medical examination without abnormal findings: Secondary | ICD-10-CM

## 2015-09-13 DIAGNOSIS — Z0189 Encounter for other specified special examinations: Secondary | ICD-10-CM | POA: Diagnosis not present

## 2015-09-13 DIAGNOSIS — E119 Type 2 diabetes mellitus without complications: Secondary | ICD-10-CM

## 2015-09-13 MED ORDER — PREDNISONE 10 MG PO TABS: ORAL_TABLET | ORAL | Status: DC

## 2015-09-13 MED ORDER — TRAMADOL HCL 50 MG PO TABS: 50.0000 mg | ORAL_TABLET | Freq: Four times a day (QID) | ORAL | Status: DC | PRN

## 2015-09-13 MED ORDER — CYCLOBENZAPRINE HCL 5 MG PO TABS: 5.0000 mg | ORAL_TABLET | Freq: Three times a day (TID) | ORAL | Status: DC | PRN

## 2015-09-13 MED ORDER — PREDNISONE 10 MG PO TABS
ORAL_TABLET | ORAL | Status: DC
Start: 2015-09-13 — End: 2015-09-13

## 2015-09-13 NOTE — Patient Instructions (Addendum)
Please take all new medication as prescribed - the pain medication, muscle relaxer as needed, and prednisone  Please consider making appt with Dr Tamala Julian in this office (sports medicine) or calling for orthopedic referral if not improved in 3-5 days  Please continue all other medications as before, and refills have been done if requested.  Please have the pharmacy call with any other refills you may need.  Please keep your appointments with your specialists as you may have planned  Please have your lab work done before returning on Friday for your physical

## 2015-09-13 NOTE — Assessment & Plan Note (Signed)
sujective pain gradually worse x 6 mo without neuro changes, most likely related to underlying djd or ddd, for tramadol/predpac/muscle relaxer as needed, to f/u sport med or ortho if not improved

## 2015-09-13 NOTE — Assessment & Plan Note (Signed)
stable overall by history and exam, recent data reviewed with pt, and pt to continue medical treatment as before,  to f/u any worsening symptoms or concerns BP Readings from Last 3 Encounters:  09/13/15 118/76  07/21/14 106/70  01/20/14 112/80

## 2015-09-13 NOTE — Assessment & Plan Note (Signed)
stable overall by history and exam, recent data reviewed with pt, and pt to continue medical treatment as before,  to f/u any worsening symptoms or concerns Lab Results  Component Value Date   HGBA1C 6.3 07/19/2014
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
## 2015-09-16 ENCOUNTER — Other Ambulatory Visit: Payer: BLUE CROSS/BLUE SHIELD

## 2015-09-16 ENCOUNTER — Encounter: Payer: Self-pay | Admitting: Internal Medicine

## 2015-09-16 ENCOUNTER — Other Ambulatory Visit (INDEPENDENT_AMBULATORY_CARE_PROVIDER_SITE_OTHER): Payer: BLUE CROSS/BLUE SHIELD

## 2015-09-16 ENCOUNTER — Ambulatory Visit (INDEPENDENT_AMBULATORY_CARE_PROVIDER_SITE_OTHER): Payer: BLUE CROSS/BLUE SHIELD | Admitting: Internal Medicine

## 2015-09-16 VITALS — BP 114/76 | HR 60 | Temp 98.5°F | Ht 72.0 in | Wt 320.0 lb

## 2015-09-16 DIAGNOSIS — E119 Type 2 diabetes mellitus without complications: Secondary | ICD-10-CM | POA: Diagnosis not present

## 2015-09-16 DIAGNOSIS — Z0000 Encounter for general adult medical examination without abnormal findings: Secondary | ICD-10-CM | POA: Diagnosis not present

## 2015-09-16 DIAGNOSIS — Z0189 Encounter for other specified special examinations: Secondary | ICD-10-CM | POA: Diagnosis not present

## 2015-09-16 DIAGNOSIS — Z23 Encounter for immunization: Secondary | ICD-10-CM

## 2015-09-16 DIAGNOSIS — M542 Cervicalgia: Secondary | ICD-10-CM

## 2015-09-16 DIAGNOSIS — I10 Essential (primary) hypertension: Secondary | ICD-10-CM

## 2015-09-16 LAB — URINALYSIS, ROUTINE W REFLEX MICROSCOPIC
BILIRUBIN URINE: NEGATIVE
Hgb urine dipstick: NEGATIVE
LEUKOCYTES UA: NEGATIVE
NITRITE: NEGATIVE
PH: 6 (ref 5.0–8.0)
TOTAL PROTEIN, URINE-UPE24: NEGATIVE
URINE GLUCOSE: 250 — AB
UROBILINOGEN UA: 0.2 (ref 0.0–1.0)

## 2015-09-16 LAB — MICROALBUMIN / CREATININE URINE RATIO
Creatinine,U: 196.1 mg/dL
Microalb Creat Ratio: 0.5 mg/g (ref 0.0–30.0)
Microalb, Ur: 1 mg/dL (ref 0.0–1.9)

## 2015-09-16 LAB — BASIC METABOLIC PANEL
BUN: 27 mg/dL — ABNORMAL HIGH (ref 6–23)
CALCIUM: 9.7 mg/dL (ref 8.4–10.5)
CO2: 30 mEq/L (ref 19–32)
CREATININE: 0.85 mg/dL (ref 0.40–1.50)
Chloride: 105 mEq/L (ref 96–112)
GFR: 97.47 mL/min (ref >60.00)
Glucose, Bld: 150 mg/dL — ABNORMAL HIGH (ref 70–99)
Potassium: 4.3 mEq/L (ref 3.5–5.1)
Sodium: 143 mEq/L (ref 135–145)

## 2015-09-16 LAB — HEPATIC FUNCTION PANEL
ALT: 36 U/L (ref 0–53)
AST: 26 U/L (ref 0–37)
Albumin: 4.3 g/dL (ref 3.5–5.2)
Alkaline Phosphatase: 52 U/L (ref 39–117)
BILIRUBIN DIRECT: 0.1 mg/dL (ref 0.0–0.3)
BILIRUBIN TOTAL: 0.3 mg/dL (ref 0.2–1.2)
TOTAL PROTEIN: 7 g/dL (ref 6.0–8.3)

## 2015-09-16 LAB — CBC WITH DIFFERENTIAL/PLATELET
BASOS ABS: 0.1 10*3/uL (ref 0.0–0.1)
Basophils Relative: 0.6 % (ref 0.0–3.0)
Eosinophils Absolute: 0.5 10*3/uL (ref 0.0–0.7)
Eosinophils Relative: 4.5 % (ref 0.0–5.0)
HEMATOCRIT: 43.2 % (ref 39.0–52.0)
HEMOGLOBIN: 14.5 g/dL (ref 13.0–17.0)
LYMPHS PCT: 28.5 % (ref 12.0–46.0)
Lymphs Abs: 2.8 10*3/uL (ref 0.7–4.0)
MCHC: 33.5 g/dL (ref 30.0–36.0)
MCV: 87.9 fl (ref 78.0–100.0)
MONOS PCT: 8.7 % (ref 3.0–12.0)
Monocytes Absolute: 0.9 10*3/uL (ref 0.1–1.0)
NEUTROS ABS: 5.8 10*3/uL (ref 1.4–7.7)
Neutrophils Relative %: 57.7 % (ref 43.0–77.0)
PLATELETS: 259 10*3/uL (ref 150.0–400.0)
RBC: 4.91 Mil/uL (ref 4.22–5.81)
RDW: 13.2 % (ref 11.5–15.5)
WBC: 10 10*3/uL (ref 4.0–10.5)

## 2015-09-16 LAB — TSH: TSH: 3.64 u[IU]/mL (ref 0.35–4.50)

## 2015-09-16 LAB — HEMOGLOBIN A1C: Hgb A1c MFr Bld: 7.7 % — ABNORMAL HIGH (ref 4.6–6.5)

## 2015-09-16 LAB — LIPID PANEL
CHOL/HDL RATIO: 4
Cholesterol: 138 mg/dL (ref 0–200)
HDL: 32.5 mg/dL — AB (ref >39.00)
LDL Cholesterol: 74 mg/dL (ref 0–99)
NONHDL: 105.57
Triglycerides: 157 mg/dL — ABNORMAL HIGH (ref 0.0–149.0)
VLDL: 31.4 mg/dL (ref 0.0–40.0)

## 2015-09-16 LAB — PSA: PSA: 0.09 ng/mL — ABNORMAL LOW (ref 0.10–4.00)

## 2015-09-16 MED ORDER — LISINOPRIL 20 MG PO TABS: 20.0000 mg | ORAL_TABLET | Freq: Every day | ORAL | Status: DC

## 2015-09-16 MED ORDER — ALLOPURINOL 300 MG PO TABS: 300.0000 mg | ORAL_TABLET | Freq: Every day | ORAL | Status: DC

## 2015-09-16 MED ORDER — VENLAFAXINE HCL ER 75 MG PO CP24: 75.0000 mg | ORAL_CAPSULE | Freq: Every day | ORAL | Status: DC

## 2015-09-16 MED ORDER — SIMVASTATIN 80 MG PO TABS: 80.0000 mg | ORAL_TABLET | Freq: Every day | ORAL | Status: DC

## 2015-09-16 MED ORDER — POTASSIUM CHLORIDE CRYS ER 20 MEQ PO TBCR: 40.0000 meq | EXTENDED_RELEASE_TABLET | Freq: Every day | ORAL | Status: DC

## 2015-09-16 MED ORDER — METFORMIN HCL ER 500 MG PO TB24: 500.0000 mg | ORAL_TABLET | Freq: Every day | ORAL | Status: DC

## 2015-09-16 MED ORDER — ATENOLOL-CHLORTHALIDONE 50-25 MG PO TABS: 0.5000 | ORAL_TABLET | Freq: Every day | ORAL | Status: DC

## 2015-09-16 NOTE — Assessment & Plan Note (Signed)

## 2015-09-16 NOTE — Assessment & Plan Note (Signed)
Ok to start meds as prescribed

## 2015-09-16 NOTE — Progress Notes (Signed)
Pre visit review using our clinic review tool, if applicable. No additional management support is needed unless otherwise documented below in the visit note. 

## 2015-09-16 NOTE — Progress Notes (Signed)
Subjective:    Patient ID: Leon Nelson, male    DOB: 17-Dec-1956, 59 y.o.   MRN: [id number]  HPI    Here for wellness and f/u;  Overall doing ok;  Pt denies Chest pain, worsening SOB, DOE, wheezing, orthopnea, PND, worsening LE edema, palpitations, dizziness or syncope.  Pt denies neurological change such as new headache, facial or extremity weakness.  Pt denies polydipsia, polyuria, or low sugar symptoms. Pt states overall good compliance with treatment and medications, good tolerability, and has been trying to follow appropriate diet.  Pt denies worsening depressive symptoms, suicidal ideation or panic. No fever, night sweats, wt loss, loss of appetite, or other constitutional symptoms.  Pt states good ability with ADL's, has low fall risk, home safety reviewed and adequate, no other significant changes in hearing or vision, and only occasionally active with exercise.  No current complaints except for increased stress related to wife chemo tx for breast ca currently  Has not yet started meds from last visit though he has them, after verbal warning of potential side effects by the pharmacist to include insomnia and sedation. Past Medical History  Diagnosis Date  . ABDOMINAL PAIN OTHER SPECIFIED SITE 07/20/2008  . CHEST PAIN 02/06/2011  . COLONIC POLYPS, HX OF 05/26/2008  . DEPRESSION 05/26/2008  . DIABETES MELLITUS, TYPE II 05/26/2008  . HYPERLIPIDEMIA 05/26/2008  . HYPERTENSION 05/26/2008  . HYPOTHYROIDISM 05/26/2008  . KNEE PAIN, RIGHT, ACUTE 07/01/2008  . Personal history of urinary calculi 05/26/2008  . UTI'S, HX OF 05/26/2008   Past Surgical History  Procedure Laterality Date  . Tonsillectomy    . S/p right knee arthroplasty       complicated by patellar tendon rupture Jan 2011 Dr. Gladstone Lighter    reports that he has never smoked. He does not have any smokeless tobacco history on file. He reports that he does not drink alcohol or use illicit drugs. family history includes Alcohol abuse in his other;  Cancer in his mother; Diabetes in his other; Heart disease in his father and mother; Hyperlipidemia in his father and mother. Allergies  Allergen Reactions  . Penicillins   . Sulfonamide Derivatives    Current Outpatient Prescriptions on File Prior to Visit  Medication Sig Dispense Refill  . allopurinol (ZYLOPRIM) 300 MG tablet TAKE ONE TABLET BY MOUTH ONCE DAILY 90 tablet 1  . atenolol-chlorthalidone (TENORETIC) 50-25 MG per tablet Take 0.5 tablets by mouth daily. 45 tablet 3  . dicyclomine (BENTYL) 20 MG tablet TAKE ONE TABLET BY MOUTH THREE TIMES DAILY AS NEEDED 180 tablet 0  . lisinopril (PRINIVIL,ZESTRIL) 20 MG tablet TAKE ONE TABLET BY MOUTH ONCE DAILY 90 tablet 3  . metFORMIN (GLUCOPHAGE-XR) 500 MG 24 hr tablet TAKE ONE TABLET BY MOUTH ONCE DAILY WITH BREAKFAST 30 tablet 0  . potassium chloride SA (K-DUR,KLOR-CON) 20 MEQ tablet TAKE 2 TABLETS DAILY 180 tablet 2  . sildenafil (VIAGRA) 100 MG tablet Take 50-100 mg by mouth daily as needed.    . simvastatin (ZOCOR) 80 MG tablet TAKE 1 TABLET AT BEDTIME 90 tablet 2  . venlafaxine XR (EFFEXOR-XR) 75 MG 24 hr capsule TAKE 1 CAPSULE DAILY 90 capsule 2  . cyclobenzaprine (FLEXERIL) 5 MG tablet Take 1 tablet (5 mg total) by mouth 3 (three) times daily as needed for muscle spasms. (Patient not taking: Reported on 09/16/2015) 60 tablet 1  . predniSONE (DELTASONE) 10 MG tablet 3 tabs by mouth per day for 3 days,2tabs per day for 3 days,1tab per day  for 3 days (Patient not taking: Reported on 09/16/2015) 18 tablet 0  . traMADol (ULTRAM) 50 MG tablet Take 1 tablet (50 mg total) by mouth every 6 (six) hours as needed. (Patient not taking: Reported on 09/16/2015) 60 tablet 2   No current facility-administered medications on file prior to visit.   Review of Systems Constitutional: Negative for increased diaphoresis, other activity, appetite or siginficant weight change other than noted HENT: Negative for worsening hearing loss, ear pain, facial swelling,  mouth sores and neck stiffness.   Eyes: Negative for other worsening pain, redness or visual disturbance.  Respiratory: Negative for shortness of breath and wheezing  Cardiovascular: Negative for chest pain and palpitations.  Gastrointestinal: Negative for diarrhea, blood in stool, abdominal distention or other pain Genitourinary: Negative for hematuria, flank pain or change in urine volume.  Musculoskeletal: Negative for myalgias or other joint complaints.  Skin: Negative for color change and wound or drainage.  Neurological: Negative for syncope and numbness. other than noted Hematological: Negative for adenopathy. or other swelling Psychiatric/Behavioral: Negative for hallucinations, SI, self-injury, decreased concentration or other worsening agitation.      Objective:   Physical Exam BP 114/76 mmHg  Pulse 60  Temp(Src) 98.5 F (36.9 C) (Oral)  Ht 6' (1.829 m)  Wt 320 lb (145.151 kg)  BMI 43.39 kg/m2  SpO2 95% VS noted,  Constitutional: Pt is oriented to person, place, and time. Appears well-developed and well-nourished, in no significant distress Head: Normocephalic and atraumatic.  Right Ear: External ear normal.  Left Ear: External ear normal.  Nose: Nose normal.  Mouth/Throat: Oropharynx is clear and moist.  Eyes: Conjunctivae and EOM are normal. Pupils are equal, round, and reactive to light.  Neck: Normal range of motion. Neck supple. No JVD present. No tracheal deviation present or significant neck LA or mass Cardiovascular: Normal rate, regular rhythm, normal heart sounds and intact distal pulses.   Pulmonary/Chest: Effort normal and breath sounds without rales or wheezing  Abdominal: Soft. Bowel sounds are normal. NT. No HSM  Musculoskeletal: Normal range of motion. Exhibits no edema.  Lymphadenopathy:  Has no cervical adenopathy.  Neurological: Pt is alert and oriented to person, place, and time. Pt has normal reflexes. No cranial nerve deficit. Motor grossly  intact Skin: Skin is warm and dry. No rash noted.  Psychiatric:  Has normal mood and affect. Behavior is normal.     Assessment & Plan:

## 2015-09-16 NOTE — Patient Instructions (Addendum)
You had the flu shot today  OK to start the medications as per your last visit here; please call if you experience any side effects but the ones you were warned of are minor in nature and low risk given these particular medications  Please continue all other medications as before, and refills have been done if requested.  Please have the pharmacy call with any other refills you may need.  Please continue your efforts at being more active, low cholesterol diet, and weight control.  You are otherwise up to date with prevention measures today.  Please keep your appointments with your specialists as you may have planned  Your Lab work was done today; we will add the Hep C antibody if possible  You will be contacted by phone if any changes need to be made immediately.  Otherwise, you will receive a letter about your results with an explanation, but please check with MyChart first.  Please remember to sign up for MyChart if you have not done so, as this will be important to you in the future with finding out test results, communicating by private email, and scheduling acute appointments online when needed.  Please return in 6 months, or sooner if needed, with Lab testing done 3-5 days before

## 2015-09-16 NOTE — Addendum Note (Signed)
Addended by: Lyman Bishop on: 09/16/2015 12:03 PM   Modules accepted: Orders

## 2015-09-16 NOTE — Assessment & Plan Note (Signed)
stable overall by history and exam, recent data reviewed with pt, and pt to continue medical treatment as before,  to f/u any worsening symptoms or concerns BP Readings from Last 3 Encounters:  09/16/15 114/76  09/13/15 118/76  07/21/14 106/70

## 2015-09-16 NOTE — Assessment & Plan Note (Signed)
stable overall by history and exam, recent data reviewed with pt, and pt to continue medical treatment as before,  to f/u any worsening symptoms or concerns Lab Results  Component Value Date   HGBA1C 6.3 07/19/2014

## 2015-09-17 LAB — HEPATITIS C ANTIBODY: HCV Ab: NEGATIVE

## 2015-09-21 ENCOUNTER — Encounter: Payer: Self-pay | Admitting: Internal Medicine

## 2015-09-21 ENCOUNTER — Other Ambulatory Visit: Payer: Self-pay | Admitting: Internal Medicine

## 2015-09-21 MED ORDER — METFORMIN HCL ER 500 MG PO TB24: ORAL_TABLET | ORAL | Status: DC

## 2015-09-24 ENCOUNTER — Other Ambulatory Visit: Payer: Self-pay | Admitting: Internal Medicine

## 2015-11-01 ENCOUNTER — Telehealth: Payer: Self-pay | Admitting: *Deleted

## 2015-11-01 MED ORDER — METFORMIN HCL ER 500 MG PO TB24: ORAL_TABLET | ORAL | Status: DC

## 2015-11-01 NOTE — Telephone Encounter (Signed)
Receive call pt states md increase his metformin to two pills a day. [facility], also want 30 day sent to local since he onlyn have enough for 2 days. Inform pt will send to both pharmacy...Leon Nelson

## 2015-12-01 ENCOUNTER — Other Ambulatory Visit: Payer: Self-pay | Admitting: Internal Medicine

## 2016-03-15 ENCOUNTER — Ambulatory Visit: Payer: BLUE CROSS/BLUE SHIELD | Admitting: Internal Medicine

## 2016-03-21 ENCOUNTER — Other Ambulatory Visit (INDEPENDENT_AMBULATORY_CARE_PROVIDER_SITE_OTHER): Payer: BLUE CROSS/BLUE SHIELD

## 2016-03-21 DIAGNOSIS — E119 Type 2 diabetes mellitus without complications: Secondary | ICD-10-CM | POA: Diagnosis not present

## 2016-03-21 LAB — HEPATIC FUNCTION PANEL
ALK PHOS: 43 U/L (ref 39–117)
ALT: 35 U/L (ref 0–53)
AST: 30 U/L (ref 0–37)
Albumin: 4.2 g/dL (ref 3.5–5.2)
BILIRUBIN DIRECT: 0.1 mg/dL (ref 0.0–0.3)
BILIRUBIN TOTAL: 0.5 mg/dL (ref 0.2–1.2)
Total Protein: 7.2 g/dL (ref 6.0–8.3)

## 2016-03-21 LAB — LIPID PANEL
CHOL/HDL RATIO: 4
Cholesterol: 125 mg/dL (ref 0–200)
HDL: 33.3 mg/dL — ABNORMAL LOW (ref >39.00)
LDL CALC: 63 mg/dL (ref 0–99)
NONHDL: 92.05
TRIGLYCERIDES: 146 mg/dL (ref 0.0–149.0)
VLDL: 29.2 mg/dL (ref 0.0–40.0)

## 2016-03-21 LAB — BASIC METABOLIC PANEL
BUN: 26 mg/dL — AB (ref 6–23)
CO2: 30 mEq/L (ref 19–32)
Calcium: 9.2 mg/dL (ref 8.4–10.5)
Chloride: 103 mEq/L (ref 96–112)
Creatinine, Ser: 0.84 mg/dL (ref 0.40–1.50)
GFR: 98.64 mL/min (ref >60.00)
Glucose, Bld: 149 mg/dL — ABNORMAL HIGH (ref 70–99)
POTASSIUM: 4.3 meq/L (ref 3.5–5.1)
Sodium: 141 mEq/L (ref 135–145)

## 2016-03-21 LAB — HEMOGLOBIN A1C: Hgb A1c MFr Bld: 7.8 % — ABNORMAL HIGH (ref 4.6–6.5)

## 2016-03-23 ENCOUNTER — Encounter: Payer: Self-pay | Admitting: Internal Medicine

## 2016-03-23 ENCOUNTER — Ambulatory Visit (INDEPENDENT_AMBULATORY_CARE_PROVIDER_SITE_OTHER): Payer: BLUE CROSS/BLUE SHIELD | Admitting: Internal Medicine

## 2016-03-23 VITALS — BP 128/64 | HR 64 | Temp 98.6°F | Resp 20 | Wt 318.0 lb

## 2016-03-23 DIAGNOSIS — E039 Hypothyroidism, unspecified: Secondary | ICD-10-CM

## 2016-03-23 DIAGNOSIS — E785 Hyperlipidemia, unspecified: Secondary | ICD-10-CM

## 2016-03-23 DIAGNOSIS — F32A Depression, unspecified: Secondary | ICD-10-CM

## 2016-03-23 DIAGNOSIS — Z0189 Encounter for other specified special examinations: Secondary | ICD-10-CM

## 2016-03-23 DIAGNOSIS — F329 Major depressive disorder, single episode, unspecified: Secondary | ICD-10-CM | POA: Diagnosis not present

## 2016-03-23 DIAGNOSIS — Z0000 Encounter for general adult medical examination without abnormal findings: Secondary | ICD-10-CM

## 2016-03-23 DIAGNOSIS — E119 Type 2 diabetes mellitus without complications: Secondary | ICD-10-CM

## 2016-03-23 DIAGNOSIS — I10 Essential (primary) hypertension: Secondary | ICD-10-CM

## 2016-03-23 MED ORDER — METFORMIN HCL ER 500 MG PO TB24: ORAL_TABLET | ORAL | Status: DC

## 2016-03-23 NOTE — Assessment & Plan Note (Signed)
stable overall by history and exam, recent data reviewed with pt, and pt to continue medical treatment as before,  to f/u any worsening symptoms or concerns Lab Results  Component Value Date   WBC 10.0 09/16/2015   HGB 14.5 09/16/2015   HCT 43.2 09/16/2015   PLT 259.0 09/16/2015   GLUCOSE 149* 03/21/2016   CHOL 125 03/21/2016   TRIG 146.0 03/21/2016   HDL 33.30* 03/21/2016   LDLCALC 63 03/21/2016   ALT 35 03/21/2016   AST 30 03/21/2016   NA 141 03/21/2016   K 4.3 03/21/2016   CL 103 03/21/2016   CREATININE 0.84 03/21/2016   BUN 26* 03/21/2016   CO2 30 03/21/2016   TSH 3.64 09/16/2015   PSA 0.09* 09/16/2015   INR 2.0 ratio* 01/16/2010   HGBA1C 7.8* 03/21/2016   MICROALBUR 1.0 09/16/2015

## 2016-03-23 NOTE — Assessment & Plan Note (Signed)
stable overall by history and exam, recent data reviewed with pt, and pt to continue medical treatment as before,  to f/u any worsening symptoms or concerns Lab Results  Component Value Date   TSH 3.64 09/16/2015

## 2016-03-23 NOTE — Progress Notes (Signed)
Pre visit review using our clinic review tool, if applicable. No additional management support is needed unless otherwise documented below in the visit note. 

## 2016-03-23 NOTE — Assessment & Plan Note (Signed)
stable overall by history and exam, recent data reviewed with pt, and pt to continue medical treatment as before,  to f/u any worsening symptoms or concerns Lab Results  Component Value Date   HGBA1C 7.8* 03/21/2016

## 2016-03-23 NOTE — Patient Instructions (Signed)
OK to increase the metformin to 3 pills per day  Please continue all other medications as before, and refills have been done if requested.  Please have the pharmacy call with any other refills you may need.  Please continue your efforts at being more active, low cholesterol diet, and weight control.  You are otherwise up to date with prevention measures today.  Please keep your appointments with your specialists as you may have planned  Please return in 6 months, or sooner if needed, with Lab testing done 3-5 days before

## 2016-03-23 NOTE — Progress Notes (Signed)
Subjective:    Patient ID: Leon Nelson, male    DOB: 07-09-1956, 60 y.o.   MRN: [id number]  HPI Here to f/u; overall doing ok,  Pt denies chest pain, increasing sob or doe, wheezing, orthopnea, PND, increased LE swelling, palpitations, dizziness or syncope.  Pt denies new neurological symptoms such as new headache, or facial or extremity weakness or numbness.  Pt denies polydipsia, polyuria, or low sugar episode.   Pt denies new neurological symptoms such as new headache, or facial or extremity weakness or numbness.   Pt states overall good compliance with meds, mostly trying to follow appropriate diet, with wt overall stable,  but little exercise however. Wife with breast ca s/p surgury (bilat mastect), CMT and xrt and he has been off his diet, but admits to poor diet due to this, and has been down to 289 at home about feb 2016.  Has 62yo and 62 yo at home as well, and 5 grandkids, more stress recently. Denies worsening depressive symptoms, suicidal ideation, or panic; Wt Readings from Last 3 Encounters:  03/23/16 318 lb (144.244 kg)  09/16/15 320 lb (145.151 kg)  09/13/15 321 lb (145.605 kg)  Denies hyper or hypo thyroid symptoms such as voice, skin or hair change. Past Medical History  Diagnosis Date  . ABDOMINAL PAIN OTHER SPECIFIED SITE 07/20/2008  . CHEST PAIN 02/06/2011  . COLONIC POLYPS, HX OF 05/26/2008  . DEPRESSION 05/26/2008  . DIABETES MELLITUS, TYPE II 05/26/2008  . HYPERLIPIDEMIA 05/26/2008  . HYPERTENSION 05/26/2008  . HYPOTHYROIDISM 05/26/2008  . KNEE PAIN, RIGHT, ACUTE 07/01/2008  . Personal history of urinary calculi 05/26/2008  . UTI'S, HX OF 05/26/2008   Past Surgical History  Procedure Laterality Date  . Tonsillectomy    . S/p right knee arthroplasty       complicated by patellar tendon rupture Jan 2011 Dr. Gladstone Lighter    reports that he has never smoked. He does not have any smokeless tobacco history on file. He reports that he does not drink alcohol or use illicit drugs. family  history includes Alcohol abuse in his other; Cancer in his mother; Diabetes in his other; Heart disease in his father and mother; Hyperlipidemia in his father and mother. Allergies  Allergen Reactions  . Penicillins   . Sulfonamide Derivatives    Current Outpatient Prescriptions on File Prior to Visit  Medication Sig Dispense Refill  . allopurinol (ZYLOPRIM) 300 MG tablet Take 1 tablet (300 mg total) by mouth daily. 90 tablet 3  . atenolol-chlorthalidone (TENORETIC) 50-25 MG per tablet Take 0.5 tablets by mouth daily. 45 tablet 3  . cyclobenzaprine (FLEXERIL) 5 MG tablet Take 1 tablet (5 mg total) by mouth 3 (three) times daily as needed for muscle spasms. 60 tablet 1  . dicyclomine (BENTYL) 20 MG tablet TAKE ONE TABLET BY MOUTH THREE TIMES DAILY AS NEEDED 180 tablet 1  . levothyroxine (SYNTHROID, LEVOTHROID) 25 MCG tablet TAKE ONE TABLET BY MOUTH ONCE DAILY 90 tablet 3  . lisinopril (PRINIVIL,ZESTRIL) 20 MG tablet Take 1 tablet (20 mg total) by mouth daily. 90 tablet 3  . metFORMIN (GLUCOPHAGE-XR) 500 MG 24 hr tablet 2 tabs by mouth in the AM 180 tablet 3  . potassium chloride SA (K-DUR,KLOR-CON) 20 MEQ tablet Take 2 tablets (40 mEq total) by mouth daily. 180 tablet 3  . sildenafil (VIAGRA) 100 MG tablet Take 50-100 mg by mouth daily as needed.    . simvastatin (ZOCOR) 80 MG tablet Take 1 tablet (80 mg total)  by mouth at bedtime. 90 tablet 3  . traMADol (ULTRAM) 50 MG tablet Take 1 tablet (50 mg total) by mouth every 6 (six) hours as needed. 60 tablet 2  . venlafaxine XR (EFFEXOR-XR) 75 MG 24 hr capsule Take 1 capsule (75 mg total) by mouth daily. 90 capsule 3   No current facility-administered medications on file prior to visit.   Review of Systems   Constitutional: Negative for unusual diaphoresis or night sweats HENT: Negative for ear swelling or discharge Eyes: Negative for worsening visual haziness  Respiratory: Negative for choking and stridor.   Gastrointestinal: Negative for  distension or worsening eructation Genitourinary: Negative for retention or change in urine volume.  Musculoskeletal: Negative for other MSK pain or swelling Skin: Negative for color change and worsening wound Neurological: Negative for tremors and numbness other than noted  Psychiatric/Behavioral: Negative for decreased concentration or agitation other than above       Objective:   Physical Exam BP 128/64 mmHg  Pulse 64  Temp(Src) 98.6 F (37 C) (Oral)  Resp 20  Wt 318 lb (144.244 kg)  SpO2 94% VS noted,  Constitutional: Pt appears in no apparent distress HENT: Head: NCAT.  Right Ear: External ear normal.  Left Ear: External ear normal.  Eyes: . Pupils are equal, round, and reactive to light. Conjunctivae and EOM are normal Neck: Normal range of motion. Neck supple.  Cardiovascular: Normal rate and regular rhythm.   Pulmonary/Chest: Effort normal and breath sounds without rales or wheezing.  Abd:  Soft, NT, ND, + BS Neurological: Pt is alert. Not confused , motor grossly intact Skin: Skin is warm. No rash, no LE edema Psychiatric: Pt behavior is normal. No agitation. mild saddened     Assessment & Plan:

## 2016-03-23 NOTE — Assessment & Plan Note (Signed)
stable overall by history and exam, recent data reviewed with pt, and pt to continue medical treatment as before,  to f/u any worsening symptoms or concerns Lab Results  Component Value Date   LDLCALC 63 03/21/2016

## 2016-03-23 NOTE — Assessment & Plan Note (Signed)
stable overall by history and exam, recent data reviewed with pt, and pt to continue medical treatment as before,  to f/u any worsening symptoms or concerns BP Readings from Last 3 Encounters:  03/23/16 128/64  09/16/15 114/76  09/13/15 118/76

## 2016-05-22 ENCOUNTER — Telehealth: Payer: Self-pay | Admitting: *Deleted

## 2016-05-22 MED ORDER — METFORMIN HCL ER 500 MG PO TB24: ORAL_TABLET | ORAL | Status: DC

## 2016-05-22 NOTE — Telephone Encounter (Signed)
Received call pt states md increase his metformin to 3 pills a day. [facility] Rx sent electronically....Johny Chess

## 2016-09-10 ENCOUNTER — Other Ambulatory Visit: Payer: Self-pay | Admitting: Internal Medicine

## 2016-09-21 ENCOUNTER — Other Ambulatory Visit (INDEPENDENT_AMBULATORY_CARE_PROVIDER_SITE_OTHER): Payer: BLUE CROSS/BLUE SHIELD

## 2016-09-21 DIAGNOSIS — E119 Type 2 diabetes mellitus without complications: Secondary | ICD-10-CM | POA: Diagnosis not present

## 2016-09-21 DIAGNOSIS — Z0000 Encounter for general adult medical examination without abnormal findings: Secondary | ICD-10-CM

## 2016-09-21 DIAGNOSIS — Z0189 Encounter for other specified special examinations: Secondary | ICD-10-CM | POA: Diagnosis not present

## 2016-09-21 LAB — LIPID PANEL
CHOL/HDL RATIO: 4
Cholesterol: 114 mg/dL (ref 0–200)
HDL: 30.1 mg/dL — AB (ref >39.00)
LDL CALC: 63 mg/dL (ref 0–99)
NONHDL: 83.95
Triglycerides: 106 mg/dL (ref 0.0–149.0)
VLDL: 21.2 mg/dL (ref 0.0–40.0)

## 2016-09-21 LAB — URINALYSIS, ROUTINE W REFLEX MICROSCOPIC
BILIRUBIN URINE: NEGATIVE
HGB URINE DIPSTICK: NEGATIVE
Ketones, ur: NEGATIVE
Leukocytes, UA: NEGATIVE
Nitrite: NEGATIVE
Specific Gravity, Urine: >=1.03 — AB (ref 1.000–1.030)
Total Protein, Urine: NEGATIVE
URINE GLUCOSE: NEGATIVE
Urobilinogen, UA: 0.2 (ref 0.0–1.0)
pH: 5.5 (ref 5.0–8.0)

## 2016-09-21 LAB — MICROALBUMIN / CREATININE URINE RATIO
Creatinine,U: 242.5 mg/dL
Microalb Creat Ratio: 0.6 mg/g (ref 0.0–30.0)
Microalb, Ur: 1.4 mg/dL (ref 0.0–1.9)

## 2016-09-21 LAB — CBC WITH DIFFERENTIAL/PLATELET
BASOS ABS: 0 10*3/uL (ref 0.0–0.1)
BASOS PCT: 0.4 % (ref 0.0–3.0)
EOS ABS: 0.5 10*3/uL (ref 0.0–0.7)
Eosinophils Relative: 4.7 % (ref 0.0–5.0)
HEMATOCRIT: 41.2 % (ref 39.0–52.0)
HEMOGLOBIN: 14 g/dL (ref 13.0–17.0)
LYMPHS PCT: 31 % (ref 12.0–46.0)
Lymphs Abs: 3 10*3/uL (ref 0.7–4.0)
MCHC: 34 g/dL (ref 30.0–36.0)
MCV: 87 fl (ref 78.0–100.0)
MONOS PCT: 10.2 % (ref 3.0–12.0)
Monocytes Absolute: 1 10*3/uL (ref 0.1–1.0)
NEUTROS ABS: 5.2 10*3/uL (ref 1.4–7.7)
Neutrophils Relative %: 53.7 % (ref 43.0–77.0)
PLATELETS: 247 10*3/uL (ref 150.0–400.0)
RBC: 4.73 Mil/uL (ref 4.22–5.81)
RDW: 12.8 % (ref 11.5–15.5)
WBC: 9.6 10*3/uL (ref 4.0–10.5)

## 2016-09-21 LAB — HEPATIC FUNCTION PANEL
ALT: 36 U/L (ref 0–53)
AST: 31 U/L (ref 0–37)
Albumin: 3.9 g/dL (ref 3.5–5.2)
Alkaline Phosphatase: 43 U/L (ref 39–117)
BILIRUBIN DIRECT: 0.1 mg/dL (ref 0.0–0.3)
BILIRUBIN TOTAL: 0.4 mg/dL (ref 0.2–1.2)
TOTAL PROTEIN: 6.6 g/dL (ref 6.0–8.3)

## 2016-09-21 LAB — BASIC METABOLIC PANEL
BUN: 25 mg/dL — AB (ref 6–23)
CALCIUM: 8.8 mg/dL (ref 8.4–10.5)
CO2: 28 mEq/L (ref 19–32)
CREATININE: 0.88 mg/dL (ref 0.40–1.50)
Chloride: 106 mEq/L (ref 96–112)
GFR: 93.33 mL/min (ref >60.00)
Glucose, Bld: 141 mg/dL — ABNORMAL HIGH (ref 70–99)
Potassium: 4.2 mEq/L (ref 3.5–5.1)
Sodium: 142 mEq/L (ref 135–145)

## 2016-09-21 LAB — HEMOGLOBIN A1C: HEMOGLOBIN A1C: 8 % — AB (ref 4.6–6.5)

## 2016-09-21 LAB — TSH: TSH: 5.88 u[IU]/mL — ABNORMAL HIGH (ref 0.35–4.50)

## 2016-09-21 LAB — PSA: PSA: 0.09 ng/mL — AB (ref 0.10–4.00)

## 2016-09-25 ENCOUNTER — Telehealth: Payer: Self-pay

## 2016-09-25 ENCOUNTER — Ambulatory Visit (INDEPENDENT_AMBULATORY_CARE_PROVIDER_SITE_OTHER): Payer: BLUE CROSS/BLUE SHIELD | Admitting: Internal Medicine

## 2016-09-25 ENCOUNTER — Encounter: Payer: Self-pay | Admitting: Internal Medicine

## 2016-09-25 ENCOUNTER — Ambulatory Visit: Payer: BLUE CROSS/BLUE SHIELD | Admitting: Internal Medicine

## 2016-09-25 VITALS — BP 136/74 | HR 70 | Temp 99.0°F | Resp 20 | Wt 314.0 lb

## 2016-09-25 DIAGNOSIS — I10 Essential (primary) hypertension: Secondary | ICD-10-CM

## 2016-09-25 DIAGNOSIS — Z0001 Encounter for general adult medical examination with abnormal findings: Secondary | ICD-10-CM | POA: Diagnosis not present

## 2016-09-25 DIAGNOSIS — E785 Hyperlipidemia, unspecified: Secondary | ICD-10-CM | POA: Diagnosis not present

## 2016-09-25 DIAGNOSIS — Z86010 Personal history of colonic polyps: Secondary | ICD-10-CM

## 2016-09-25 DIAGNOSIS — E119 Type 2 diabetes mellitus without complications: Secondary | ICD-10-CM | POA: Diagnosis not present

## 2016-09-25 DIAGNOSIS — E039 Hypothyroidism, unspecified: Secondary | ICD-10-CM

## 2016-09-25 MED ORDER — GLIPIZIDE ER 10 MG PO TB24: 10.0000 mg | ORAL_TABLET | Freq: Every day | ORAL | 3 refills | Status: DC

## 2016-09-25 MED ORDER — LEVOTHYROXINE SODIUM 50 MCG PO TABS: 50.0000 ug | ORAL_TABLET | Freq: Every day | ORAL | 3 refills | Status: DC

## 2016-09-25 MED ORDER — LISINOPRIL 20 MG PO TABS: 20.0000 mg | ORAL_TABLET | Freq: Every day | ORAL | 3 refills | Status: DC

## 2016-09-25 MED ORDER — SILDENAFIL CITRATE 100 MG PO TABS: 50.0000 mg | ORAL_TABLET | Freq: Every day | ORAL | 0 refills | Status: DC | PRN

## 2016-09-25 MED ORDER — SIMVASTATIN 80 MG PO TABS: 80.0000 mg | ORAL_TABLET | Freq: Every day | ORAL | 3 refills | Status: DC

## 2016-09-25 MED ORDER — DICYCLOMINE HCL 20 MG PO TABS: 20.0000 mg | ORAL_TABLET | Freq: Three times a day (TID) | ORAL | 1 refills | Status: DC | PRN

## 2016-09-25 NOTE — Patient Instructions (Addendum)
You had the flu shot today  Please make Nurse Visit appt for the  Prevnar 13 pneumonia shot in 2 wks  Please take all new medication as prescribed- the glipizide ER 5 mg per day  OK to increase the thyroid medication from 25 to 50 mcg per day  Please make sure to follow up yearly for the eye exam  You will be contacted regarding the referral for: colonoscopy  Please continue all other medications as before, and refills have been done if requested.  Please have the pharmacy call with any other refills you may need.  Please continue your efforts at being more active, low cholesterol diet, and weight control.  You are otherwise up to date with prevention measures today.  Please keep your appointments with your specialists as you may have planned  Please return in 6 months, or sooner if needed, with Lab testing done 3-5 days before

## 2016-09-25 NOTE — Telephone Encounter (Signed)
Medication refill sent .

## 2016-09-25 NOTE — Progress Notes (Signed)
Subjective:    Patient ID: Leon Nelson, male    DOB: 11-Aug-1956, 60 y.o.   MRN: [id number]  HPI  Here for wellness and f/u;  Overall doing ok;  Pt denies Chest pain, worsening SOB, DOE, wheezing, orthopnea, PND, worsening LE edema, palpitations, dizziness or syncope.  Pt denies neurological change such as new headache, facial or extremity weakness.  Pt denies polydipsia, polyuria, or low sugar symptoms. Pt states overall good compliance with treatment and medications, good tolerability, and has been trying to follow appropriate diet. . No fever, night sweats, wt loss, loss of appetite, or other constitutional symptoms.  Pt states good ability with ADL's, has low fall risk, home safety reviewed and adequate, no other significant changes in hearing or vision, and only occasionally active with exercise.  Plans to be more active.  Admits to some dietary non compliance but a1c increased this admit despite incresaed metformin last visit.  Wt Readings from Last 3 Encounters:  09/25/16 (!) 314 lb (142.4 kg)  03/23/16 (!) 318 lb (144.2 kg)  09/16/15 (!) 320 lb (145.2 kg)   Pt denies polydipsia, polyuria, or low sugar symptoms such as weakness or confusion improved with po intake.  Pt states overall good compliance with meds, trying to follow lower cholesterol, diabetic diet, wt overall stable but little exercise however.   Denies hyper or hypo thyroid symptoms such as voice, skin or hair change. Past Medical History:  Diagnosis Date  . ABDOMINAL PAIN OTHER SPECIFIED SITE 07/20/2008  . CHEST PAIN 02/06/2011  . COLONIC POLYPS, HX OF 05/26/2008  . DEPRESSION 05/26/2008  . DIABETES MELLITUS, TYPE II 05/26/2008  . HYPERLIPIDEMIA 05/26/2008  . HYPERTENSION 05/26/2008  . HYPOTHYROIDISM 05/26/2008  . KNEE PAIN, RIGHT, ACUTE 07/01/2008  . Personal history of urinary calculi 05/26/2008  . UTI'S, HX OF 05/26/2008   Past Surgical History:  Procedure Laterality Date  . s/p right knee arthroplasty      complicated by  patellar tendon rupture Jan 2011 Dr. Gladstone Lighter  . TONSILLECTOMY      reports that he has never smoked. He does not have any smokeless tobacco history on file. He reports that he does not drink alcohol or use drugs. family history includes Alcohol abuse in his other; Cancer in his mother; Diabetes in his other; Heart disease in his father and mother; Hyperlipidemia in his father and mother. Allergies  Allergen Reactions  . Penicillins   . Sulfonamide Derivatives    Current Outpatient Prescriptions on File Prior to Visit  Medication Sig Dispense Refill  . allopurinol (ZYLOPRIM) 300 MG tablet Take 1 tablet (300 mg total) by mouth daily. Overdue for yearly physical w/lans must see MD for refills 90 tablet 0  . atenolol-chlorthalidone (TENORETIC) 50-25 MG tablet Take 0.5 tablets by mouth daily. Overdue for yearly physical w/labs must see MD for refills 45 tablet 0  . metFORMIN (GLUCOPHAGE-XR) 500 MG 24 hr tablet 3 tabs by mouth in the AM 270 tablet 3  . potassium chloride SA (K-DUR,KLOR-CON) 20 MEQ tablet Take 2 tablets (40 mEq total) by mouth daily. Yearly physical is due must see MD for refills 180 tablet 0  . venlafaxine XR (EFFEXOR-XR) 75 MG 24 hr capsule Take 1 capsule (75 mg total) by mouth daily. 90 capsule 3   No current facility-administered medications on file prior to visit.    Review of Systems Constitutional: Negative for increased diaphoresis, or other activity, appetite or siginficant weight change other than noted HENT: Negative for worsening hearing  loss, ear pain, facial swelling, mouth sores and neck stiffness.   Eyes: Negative for other worsening pain, redness or visual disturbance.  Respiratory: Negative for choking or stridor Cardiovascular: Negative for other chest pain and palpitations.  Gastrointestinal: Negative for worsening diarrhea, blood in stool, or abdominal distention Genitourinary: Negative for hematuria, flank pain or change in urine volume.  Musculoskeletal:  Negative for myalgias or other joint complaints.  Skin: Negative for other color change and wound or drainage.  Neurological: Negative for syncope and numbness. other than noted Hematological: Negative for adenopathy. or other swelling Psychiatric/Behavioral: Negative for hallucinations, SI, self-injury, decreased concentration or other worsening agitation.      Objective:   Physical Exam BP 136/74   Pulse 70   Temp 99 F (37.2 C) (Oral)   Resp 20   Wt (!) 314 lb (142.4 kg)   SpO2 94%   BMI 42.59 kg/m  VS noted,  Constitutional: Pt is oriented to person, place, and time. Appears well-developed and well-nourished, in no significant distress Head: Normocephalic and atraumatic  Eyes: Conjunctivae and EOM are normal. Pupils are equal, round, and reactive to light Right Ear: External ear normal.  Left Ear: External ear normal Nose: Nose normal.  Mouth/Throat: Oropharynx is clear and moist  Neck: Normal range of motion. Neck supple. No JVD present. No tracheal deviation present or significant neck LA or mass Cardiovascular: Normal rate, regular rhythm, normal heart sounds and intact distal pulses.   Pulmonary/Chest: Effort normal and breath sounds without rales or wheezing  Abdominal: Soft. Bowel sounds are normal. NT. No HSM  Musculoskeletal: Normal range of motion. Exhibits no edema Lymphadenopathy: Has no cervical adenopathy.  Neurological: Pt is alert and oriented to person, place, and time. Pt has normal reflexes. No cranial nerve deficit. Motor grossly intact Skin: Skin is warm and dry. No rash noted or new ulcers Psychiatric:  Has normal mood and affect. Behavior is normal.     Assessment & Plan:

## 2016-09-25 NOTE — Progress Notes (Signed)
Pre visit review using our clinic review tool, if applicable. No additional management support is needed unless otherwise documented below in the visit note. 

## 2016-09-25 NOTE — Telephone Encounter (Signed)
-----   Message from Biagio Borg, MD sent at 09/25/2016  4:49 PM EDT ----- Regarding: med refills Please to [facility] except lisinopril, and bentyl and viagra and any other already done today

## 2016-09-26 ENCOUNTER — Telehealth: Payer: Self-pay | Admitting: Internal Medicine

## 2016-09-26 MED ORDER — LEVOTHYROXINE SODIUM 50 MCG PO TABS: 50.0000 ug | ORAL_TABLET | Freq: Every day | ORAL | 0 refills | Status: DC

## 2016-09-26 NOTE — Telephone Encounter (Signed)
Patient is requesting 14 days of levothyroxine to be sent to [facility] in [place].  Patient has been out of this medication for two days and is [facility].

## 2016-09-26 NOTE — Telephone Encounter (Signed)
Ok, to corinne to help, please

## 2016-09-26 NOTE — Telephone Encounter (Signed)
sent 

## 2016-09-29 NOTE — Assessment & Plan Note (Signed)
stable overall by history and exam, recent data reviewed with pt, and pt to continue medical treatment as before,  to f/u any worsening symptoms or concerns Lab Results  Component Value Date   LDLCALC 63 09/21/2016

## 2016-09-29 NOTE — Assessment & Plan Note (Signed)
For colonscopy as is due

## 2016-09-29 NOTE — Assessment & Plan Note (Addendum)
Mild unctonrolled, stable overall by history and exam, recent data reviewed with pt, and pt to add glipizide xl 5 qd,  to f/u any worsening symptoms or concerns, cont monitor diet and cbg's, f/u next visit Lab Results  Component Value Date   LDLCALC 63 09/21/2016   In addition to the time spent performing CPE, I spent an additional 25 minutes face to face,in which greater than 50% of this time was spent in counseling and coordination of care for patient's acute illness as documented.

## 2016-09-29 NOTE — Assessment & Plan Note (Signed)
Asympt, mild uncontrolled, for increased levothryoxine to 50 qd, to f/u any worsening symptoms or concerns

## 2016-09-29 NOTE — Assessment & Plan Note (Signed)

## 2016-09-29 NOTE — Assessment & Plan Note (Signed)
stable overall by history and exam, recent data reviewed with pt, and pt to continue medical treatment as before,  to f/u any worsening symptoms or concerns BP Readings from Last 3 Encounters:  09/25/16 136/74  03/23/16 128/64  09/16/15 114/76

## 2016-10-21 ENCOUNTER — Other Ambulatory Visit: Payer: Self-pay | Admitting: Internal Medicine

## 2016-10-23 ENCOUNTER — Telehealth: Payer: Self-pay | Admitting: Internal Medicine

## 2016-10-23 NOTE — Telephone Encounter (Signed)
Yes, the wellness physical was a part of his evaluation on Oct 3, thanks

## 2016-10-23 NOTE — Telephone Encounter (Signed)
Patient wanted to know if he has had a CPE this year for insurance reasons. They all say follow ups and he has done labs a couple of times. Can you please follow up with patient. Thank you.

## 2016-10-24 NOTE — Telephone Encounter (Signed)
Patient informed. He states that date needs to have a code on it as a CPE. Because insurance wont pay unless it does. He also states that on that date he had to pay a copay when he came in.   Dustin I am unsure what to do with this at this point, Please advise.

## 2016-10-29 NOTE — Telephone Encounter (Signed)
I called the patient and advised that we did bill a CPE and OV on the DOS. We can prove that we billed this if he needs. He is calling insurance to follow up

## 2016-11-17 ENCOUNTER — Other Ambulatory Visit: Payer: Self-pay | Admitting: Internal Medicine

## 2017-01-15 DIAGNOSIS — K640 First degree hemorrhoids: Secondary | ICD-10-CM | POA: Diagnosis not present

## 2017-01-15 DIAGNOSIS — Z86010 Personal history of colonic polyps: Secondary | ICD-10-CM | POA: Diagnosis not present

## 2017-02-20 ENCOUNTER — Other Ambulatory Visit: Payer: Self-pay | Admitting: Internal Medicine

## 2017-03-25 ENCOUNTER — Other Ambulatory Visit (INDEPENDENT_AMBULATORY_CARE_PROVIDER_SITE_OTHER): Payer: BLUE CROSS/BLUE SHIELD

## 2017-03-25 DIAGNOSIS — E119 Type 2 diabetes mellitus without complications: Secondary | ICD-10-CM | POA: Diagnosis not present

## 2017-03-25 LAB — LIPID PANEL
CHOL/HDL RATIO: 4
Cholesterol: 131 mg/dL (ref 0–200)
HDL: 33 mg/dL — ABNORMAL LOW (ref >39.00)
LDL Cholesterol: 73 mg/dL (ref 0–99)
NONHDL: 98.2
Triglycerides: 124 mg/dL (ref 0.0–149.0)
VLDL: 24.8 mg/dL (ref 0.0–40.0)

## 2017-03-25 LAB — BASIC METABOLIC PANEL
BUN: 25 mg/dL — ABNORMAL HIGH (ref 6–23)
CHLORIDE: 107 meq/L (ref 96–112)
CO2: 31 meq/L (ref 19–32)
Calcium: 9.3 mg/dL (ref 8.4–10.5)
Creatinine, Ser: 0.84 mg/dL (ref 0.40–1.50)
GFR: 98.32 mL/min (ref >60.00)
Glucose, Bld: 152 mg/dL — ABNORMAL HIGH (ref 70–99)
POTASSIUM: 5.2 meq/L — AB (ref 3.5–5.1)
Sodium: 142 mEq/L (ref 135–145)

## 2017-03-25 LAB — HEPATIC FUNCTION PANEL
ALT: 28 U/L (ref 0–53)
AST: 21 U/L (ref 0–37)
Albumin: 4 g/dL (ref 3.5–5.2)
Alkaline Phosphatase: 42 U/L (ref 39–117)
BILIRUBIN DIRECT: 0.1 mg/dL (ref 0.0–0.3)
BILIRUBIN TOTAL: 0.4 mg/dL (ref 0.2–1.2)
Total Protein: 6.8 g/dL (ref 6.0–8.3)

## 2017-03-25 LAB — HEMOGLOBIN A1C: HEMOGLOBIN A1C: 7.1 % — AB (ref 4.6–6.5)

## 2017-03-26 ENCOUNTER — Telehealth: Payer: Self-pay

## 2017-03-26 NOTE — Telephone Encounter (Signed)
Called pt, left detailed msg. 

## 2017-03-26 NOTE — Telephone Encounter (Signed)
Patient has already taken this for today.  Requesting call back in regard at [phone].

## 2017-03-26 NOTE — Telephone Encounter (Signed)
Called pt, left detailed msg stating the below info from Dr. Jenny Reichmann.

## 2017-03-26 NOTE — Telephone Encounter (Signed)
Simply do not take more, thanks

## 2017-03-26 NOTE — Telephone Encounter (Signed)
-----   Message from Biagio Borg, MD sent at 03/26/2017  8:35 AM EDT ----- shirron to let pt know - K is borderline elevated, so OK to hold off on taking his K med today and we will see him tomorrow at his visit

## 2017-03-26 NOTE — Telephone Encounter (Signed)
Called pt back per msg from [place]. He has taken medication already today. Would you like him to not take it tomorrow? How would you like to proceed?

## 2017-03-27 ENCOUNTER — Ambulatory Visit (INDEPENDENT_AMBULATORY_CARE_PROVIDER_SITE_OTHER): Payer: BLUE CROSS/BLUE SHIELD | Admitting: Internal Medicine

## 2017-03-27 ENCOUNTER — Encounter: Payer: Self-pay | Admitting: Internal Medicine

## 2017-03-27 VITALS — BP 118/78 | HR 63 | Temp 98.6°F | Ht 72.0 in | Wt 323.0 lb

## 2017-03-27 DIAGNOSIS — E785 Hyperlipidemia, unspecified: Secondary | ICD-10-CM | POA: Diagnosis not present

## 2017-03-27 DIAGNOSIS — I10 Essential (primary) hypertension: Secondary | ICD-10-CM | POA: Diagnosis not present

## 2017-03-27 DIAGNOSIS — E119 Type 2 diabetes mellitus without complications: Secondary | ICD-10-CM | POA: Diagnosis not present

## 2017-03-27 DIAGNOSIS — Z0001 Encounter for general adult medical examination with abnormal findings: Secondary | ICD-10-CM

## 2017-03-27 DIAGNOSIS — E876 Hypokalemia: Secondary | ICD-10-CM | POA: Diagnosis not present

## 2017-03-27 MED ORDER — POTASSIUM CHLORIDE ER 10 MEQ PO TBCR: 30.0000 meq | EXTENDED_RELEASE_TABLET | Freq: Every day | ORAL | 3 refills | Status: DC

## 2017-03-27 NOTE — Assessment & Plan Note (Signed)
stable overall by history and exam, recent data reviewed with pt, and pt to continue medical treatment as before,  to f/u any worsening symptoms or concerns Lab Results  Component Value Date   LDLCALC 73 03/25/2017   

## 2017-03-27 NOTE — Assessment & Plan Note (Signed)
.  stable overall by history and exam, recent data reviewed with pt, and pt to continue medical treatment as before,  to f/u any worsening symptoms or concerns BP Readings from Last 3 Encounters:  03/27/17 118/78  09/25/16 136/74  03/23/16 128/64

## 2017-03-27 NOTE — Assessment & Plan Note (Signed)
Mild elevated, to decrease his K to 30 qd, f/u next visit

## 2017-03-27 NOTE — Assessment & Plan Note (Signed)
Improved with more active at work, cont current tx,  to f/u any worsening symptoms or concerns Lab Results  Component Value Date   HGBA1C 7.1 (H) 03/25/2017

## 2017-03-27 NOTE — Patient Instructions (Signed)
OK to stop the Kdur 20 meq pills (or you can take one and 1/2 pills to use them up)  Please take all new medication as prescribed  - the Kdur 10 meq at 3 pills per day  Please continue all other medications as before, and refills have been done if requested.  Please have the pharmacy call with any other refills you may need.  Please continue your efforts at being more active, low cholesterol diet, and weight control.  Please keep your appointments with your specialists as you may have planned  You are given the note from Oct 2017 that you did indeed have a physical in 2017.  Please see our Office Manager Clayborn Bigness for any further questions.  Please return in 6 months, or sooner if needed, with Lab testing done 3-5 days before

## 2017-03-27 NOTE — Progress Notes (Signed)
Pre visit review using our clinic review tool, if applicable. No additional management support is needed unless otherwise documented below in the visit note. 

## 2017-03-27 NOTE — Progress Notes (Signed)
Subjective:    Patient ID: Leon Nelson, male    DOB: 1956-07-01, 61 y.o.   MRN: [id number]  HPI  Here to f/u; overall doing ok,  Pt denies chest pain, increasing sob or doe, wheezing, orthopnea, PND, increased LE swelling, palpitations, dizziness or syncope.  Pt denies new neurological symptoms such as new headache, or facial or extremity weakness or numbness.  Pt denies polydipsia, polyuria, or low sugar episode.   Pt denies new neurological symptoms such as new headache, or facial or extremity weakness or numbness.   Pt states overall good compliance with meds, mostly trying to follow appropriate diet, with wt overall stable,  but little aerobic exercise however. Has gained wt in 6 mo.   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  Right knee ok s/p replacement, but now left more aggrevating lately.   Past Medical History:  Diagnosis Date  . ABDOMINAL PAIN OTHER SPECIFIED SITE 07/20/2008  . CHEST PAIN 02/06/2011  . COLONIC POLYPS, HX OF 05/26/2008  . DEPRESSION 05/26/2008  . DIABETES MELLITUS, TYPE II 05/26/2008  . HYPERLIPIDEMIA 05/26/2008  . HYPERTENSION 05/26/2008  . HYPOTHYROIDISM 05/26/2008  . KNEE PAIN, RIGHT, ACUTE 07/01/2008  . Personal history of urinary calculi 05/26/2008  . UTI'S, HX OF 05/26/2008   Past Surgical History:  Procedure Laterality Date  . s/p right knee arthroplasty      complicated by patellar tendon rupture Jan 2011 Dr. Gladstone Lighter  . TONSILLECTOMY      reports that he has never smoked. He has never used smokeless tobacco. He reports that he does not drink alcohol or use drugs. family history includes Alcohol abuse in his other; Cancer in his mother; Diabetes in his other; Heart disease in his  father and mother; Hyperlipidemia in his father and mother. Allergies  Allergen Reactions  . Penicillins   . Sulfonamide Derivatives    Current Outpatient Prescriptions on File Prior to Visit  Medication Sig Dispense Refill  . allopurinol (ZYLOPRIM) 300 MG tablet Take 1 tablet (300 mg total) by mouth daily. 90 tablet 2  . atenolol-chlorthalidone (TENORETIC) 50-25 MG tablet Take one-half tablet by mouth every day 45 tablet 2  . dicyclomine (BENTYL) 20 MG tablet Take 1 tablet (20 mg total) by mouth 3 (three) times daily as needed. 180 tablet 1  . glipiZIDE (GLIPIZIDE XL) 10 MG 24 hr tablet Take 1 tablet (10 mg total) by mouth daily with breakfast. 90 tablet 3  . levothyroxine (SYNTHROID, LEVOTHROID) 50 MCG tablet Take 1 tablet (50 mcg total) by mouth daily. 14 tablet 0  . lisinopril (PRINIVIL,ZESTRIL) 20 MG tablet Take 1 tablet (20 mg total) by mouth daily. Yearly physical is due must see Md for refills 90 tablet 3  . metFORMIN (GLUCOPHAGE-XR) 500 MG 24 hr tablet 3 tabs by mouth in the AM 270 tablet 3  . sildenafil (VIAGRA) 100 MG tablet Take 0.5-1 tablets (50-100 mg total) by mouth daily as needed. 10 tablet 0  . simvastatin (ZOCOR) 80 MG tablet Take 1 tablet (80 mg total) by mouth at bedtime. 90 tablet 3  . venlafaxine XR (EFFEXOR-XR) 75 MG 24 hr capsule TAKE 1 CAPSULE  DAILY 90 capsule 3   No current facility-administered medications on file prior to visit.    Review of Systems  Constitutional: Negative for other unusual diaphoresis or sweats HENT: Negative for ear discharge or swelling Eyes: Negative for other worsening visual disturbances Respiratory: Negative for stridor or other swelling  Gastrointestinal: Negative for worsening distension or other blood Genitourinary: Negative for retention or other urinary change Musculoskeletal: Negative for other MSK pain or swelling Skin: Negative for color change or other new lesions Neurological: Negative for worsening tremors and other  numbness  Psychiatric/Behavioral: Negative for worsening agitation or other fatigue All other system neg per pt  Objective:   Physical Exam BP 118/78   Pulse 63   Temp 98.6 F (37 C) (Oral)   Ht 6' (1.829 m)   Wt (!) 323 lb (146.5 kg)   SpO2 96%   BMI 43.81 kg/m  VS noted, morbid obese Constitutional: Pt appears in NAD HENT: Head: NCAT.  Right Ear: External ear normal.  Left Ear: External ear normal.  Eyes: . Pupils are equal, round, and reactive to light. Conjunctivae and EOM are normal Nose: without d/c or deformity Neck: Neck supple. Gross normal ROM Cardiovascular: Normal rate and regular rhythm.   Pulmonary/Chest: Effort normal and breath sounds without rales or wheezing.  Neurological: Pt is alert. At baseline orientation, motor grossly intact Skin: Skin is warm. No rashes, other new lesions, no LE edema Psychiatric: Pt behavior is normal without agitation  No other exam findings Lab Results  Component Value Date   WBC 9.6 09/21/2016   HGB 14.0 09/21/2016   HCT 41.2 09/21/2016   PLT 247.0 09/21/2016   GLUCOSE 152 (H) 03/25/2017   CHOL 131 03/25/2017   TRIG 124.0 03/25/2017   HDL 33.00 (L) 03/25/2017   LDLCALC 73 03/25/2017   ALT 28 03/25/2017   AST 21 03/25/2017   NA 142 03/25/2017   K 5.2 (H) 03/25/2017   CL 107 03/25/2017   CREATININE 0.84 03/25/2017   BUN 25 (H) 03/25/2017   CO2 31 03/25/2017   TSH 5.88 (H) 09/21/2016   PSA 0.09 (L) 09/21/2016   INR 2.0 ratio (H) 01/16/2010   HGBA1C 7.1 (H) 03/25/2017   MICROALBUR 1.4 09/21/2016       Assessment & Plan:

## 2017-06-13 ENCOUNTER — Other Ambulatory Visit: Payer: Self-pay | Admitting: Internal Medicine

## 2017-08-24 ENCOUNTER — Other Ambulatory Visit: Payer: Self-pay | Admitting: Internal Medicine

## 2017-10-08 ENCOUNTER — Encounter: Payer: BLUE CROSS/BLUE SHIELD | Admitting: Internal Medicine

## 2017-10-09 ENCOUNTER — Other Ambulatory Visit (INDEPENDENT_AMBULATORY_CARE_PROVIDER_SITE_OTHER): Payer: BLUE CROSS/BLUE SHIELD

## 2017-10-09 DIAGNOSIS — E119 Type 2 diabetes mellitus without complications: Secondary | ICD-10-CM

## 2017-10-09 DIAGNOSIS — Z0001 Encounter for general adult medical examination with abnormal findings: Secondary | ICD-10-CM | POA: Diagnosis not present

## 2017-10-09 LAB — BASIC METABOLIC PANEL
BUN: 25 mg/dL — AB (ref 6–23)
CHLORIDE: 104 meq/L (ref 96–112)
CO2: 28 mEq/L (ref 19–32)
Calcium: 9.3 mg/dL (ref 8.4–10.5)
Creatinine, Ser: 0.79 mg/dL (ref 0.40–1.50)
GFR: 105.35 mL/min (ref >60.00)
GLUCOSE: 135 mg/dL — AB (ref 70–99)
POTASSIUM: 4.1 meq/L (ref 3.5–5.1)
Sodium: 143 mEq/L (ref 135–145)

## 2017-10-09 LAB — HEPATIC FUNCTION PANEL
ALBUMIN: 4 g/dL (ref 3.5–5.2)
ALK PHOS: 45 U/L (ref 39–117)
ALT: 27 U/L (ref 0–53)
AST: 19 U/L (ref 0–37)
BILIRUBIN TOTAL: 0.4 mg/dL (ref 0.2–1.2)
Bilirubin, Direct: 0.1 mg/dL (ref 0.0–0.3)
Total Protein: 6.6 g/dL (ref 6.0–8.3)

## 2017-10-09 LAB — URINALYSIS, ROUTINE W REFLEX MICROSCOPIC
Bilirubin Urine: NEGATIVE
Hgb urine dipstick: NEGATIVE
Ketones, ur: NEGATIVE
Leukocytes, UA: NEGATIVE
NITRITE: NEGATIVE
PH: 5.5 (ref 5.0–8.0)
RBC / HPF: NONE SEEN (ref 0–?)
Total Protein, Urine: NEGATIVE
Urine Glucose: NEGATIVE
Urobilinogen, UA: 0.2 (ref 0.0–1.0)
WBC UA: NONE SEEN (ref 0–?)

## 2017-10-09 LAB — CBC WITH DIFFERENTIAL/PLATELET
BASOS PCT: 1 % (ref 0.0–3.0)
Basophils Absolute: 0.1 10*3/uL (ref 0.0–0.1)
EOS ABS: 0.7 10*3/uL (ref 0.0–0.7)
Eosinophils Relative: 7.4 % — ABNORMAL HIGH (ref 0.0–5.0)
HEMATOCRIT: 42.5 % (ref 39.0–52.0)
HEMOGLOBIN: 14.2 g/dL (ref 13.0–17.0)
LYMPHS ABS: 3.1 10*3/uL (ref 0.7–4.0)
Lymphocytes Relative: 30.2 % (ref 12.0–46.0)
MCHC: 33.4 g/dL (ref 30.0–36.0)
MCV: 89.2 fl (ref 78.0–100.0)
Monocytes Absolute: 0.8 10*3/uL (ref 0.1–1.0)
Monocytes Relative: 7.5 % (ref 3.0–12.0)
Neutro Abs: 5.4 10*3/uL (ref 1.4–7.7)
Neutrophils Relative %: 53.9 % (ref 43.0–77.0)
PLATELETS: 241 10*3/uL (ref 150.0–400.0)
RBC: 4.77 Mil/uL (ref 4.22–5.81)
RDW: 13.7 % (ref 11.5–15.5)
WBC: 10.1 10*3/uL (ref 4.0–10.5)

## 2017-10-09 LAB — MICROALBUMIN / CREATININE URINE RATIO
Creatinine,U: 202.5 mg/dL
MICROALB UR: 1.2 mg/dL (ref 0.0–1.9)
MICROALB/CREAT RATIO: 0.6 mg/g (ref 0.0–30.0)

## 2017-10-09 LAB — LIPID PANEL
CHOLESTEROL: 138 mg/dL (ref 0–200)
HDL: 32.3 mg/dL — ABNORMAL LOW (ref >39.00)
NonHDL: 105.77
Total CHOL/HDL Ratio: 4
Triglycerides: 286 mg/dL — ABNORMAL HIGH (ref 0.0–149.0)
VLDL: 57.2 mg/dL — ABNORMAL HIGH (ref 0.0–40.0)

## 2017-10-09 LAB — PSA: PSA: 0.09 ng/mL — AB (ref 0.10–4.00)

## 2017-10-09 LAB — TSH: TSH: 2.88 u[IU]/mL (ref 0.35–4.50)

## 2017-10-09 LAB — HEMOGLOBIN A1C: HEMOGLOBIN A1C: 6.7 % — AB (ref 4.6–6.5)

## 2017-10-09 LAB — LDL CHOLESTEROL, DIRECT: LDL DIRECT: 78 mg/dL

## 2017-10-11 ENCOUNTER — Ambulatory Visit (INDEPENDENT_AMBULATORY_CARE_PROVIDER_SITE_OTHER): Payer: BLUE CROSS/BLUE SHIELD | Admitting: Internal Medicine

## 2017-10-11 ENCOUNTER — Encounter: Payer: Self-pay | Admitting: Internal Medicine

## 2017-10-11 VITALS — BP 106/76 | HR 57 | Temp 98.0°F | Ht 72.0 in | Wt 307.0 lb

## 2017-10-11 DIAGNOSIS — Z114 Encounter for screening for human immunodeficiency virus [HIV]: Secondary | ICD-10-CM

## 2017-10-11 DIAGNOSIS — E119 Type 2 diabetes mellitus without complications: Secondary | ICD-10-CM

## 2017-10-11 DIAGNOSIS — Z0000 Encounter for general adult medical examination without abnormal findings: Secondary | ICD-10-CM

## 2017-10-11 MED ORDER — ATENOLOL-CHLORTHALIDONE 50-25 MG PO TABS: ORAL_TABLET | ORAL | 3 refills | Status: DC

## 2017-10-11 MED ORDER — ALLOPURINOL 300 MG PO TABS: 300.0000 mg | ORAL_TABLET | Freq: Every day | ORAL | 3 refills | Status: DC

## 2017-10-11 MED ORDER — SIMVASTATIN 80 MG PO TABS: 80.0000 mg | ORAL_TABLET | Freq: Every day | ORAL | 3 refills | Status: DC

## 2017-10-11 MED ORDER — METFORMIN HCL ER 500 MG PO TB24: ORAL_TABLET | ORAL | 3 refills | Status: DC

## 2017-10-11 MED ORDER — VENLAFAXINE HCL ER 75 MG PO CP24: 75.0000 mg | ORAL_CAPSULE | Freq: Every day | ORAL | 3 refills | Status: DC

## 2017-10-11 MED ORDER — DICYCLOMINE HCL 20 MG PO TABS: 20.0000 mg | ORAL_TABLET | Freq: Three times a day (TID) | ORAL | 1 refills | Status: DC | PRN

## 2017-10-11 MED ORDER — POTASSIUM CHLORIDE ER 10 MEQ PO TBCR: 30.0000 meq | EXTENDED_RELEASE_TABLET | Freq: Every day | ORAL | 3 refills | Status: DC

## 2017-10-11 MED ORDER — GLIPIZIDE ER 10 MG PO TB24: 10.0000 mg | ORAL_TABLET | Freq: Every day | ORAL | 3 refills | Status: DC

## 2017-10-11 MED ORDER — LEVOTHYROXINE SODIUM 50 MCG PO TABS: 50.0000 ug | ORAL_TABLET | Freq: Every day | ORAL | 3 refills | Status: DC

## 2017-10-11 MED ORDER — LISINOPRIL 20 MG PO TABS: 20.0000 mg | ORAL_TABLET | Freq: Every day | ORAL | 3 refills | Status: DC

## 2017-10-11 NOTE — Assessment & Plan Note (Addendum)
Lab Results  Component Value Date   HGBA1C 6.7 (H) 10/09/2017  stable overall by history and exam, recent data reviewed with pt, and pt to continue medical treatment as before,  to f/u any worsening symptoms or concerns, for podiatry referral related to DM

## 2017-10-11 NOTE — Patient Instructions (Addendum)
Please remember to set up your yearly eye doctor appt  You will be contacted regarding the referral for: podiatry  You had the Prevnar 13 pneumonia shot today  Please continue all other medications as before, and refills have been done if requested.  Please have the pharmacy call with any other refills you may need.  Please continue your efforts at being more active, low cholesterol diet, and weight control.  You are otherwise up to date with prevention measures today.  Please keep your appointments with your specialists as you may have planned  Please return in 6 months, or sooner if needed, with Lab testing done 3-5 days before

## 2017-10-11 NOTE — Assessment & Plan Note (Signed)

## 2017-10-11 NOTE — Progress Notes (Signed)
Subjective:    Patient ID: Leon Nelson, male    DOB: [birth year], 61 y.o.   MRN: [id number]  HPI  Here for wellness and f/u;  Overall doing ok;  Pt denies Chest pain, worsening SOB, DOE, wheezing, orthopnea, PND, worsening LE edema, palpitations, dizziness or syncope.  Pt denies neurological change such as new headache, facial or extremity weakness.  Pt denies polydipsia, polyuria, or low sugar symptoms. Pt states overall good compliance with treatment and medications, good tolerability, and has been trying to follow appropriate diet.  Pt denies worsening depressive symptoms, suicidal ideation or panic. No fever, night sweats, wt loss, loss of appetite, or other constitutional symptoms.  Pt states good ability with ADL's, has low fall risk, home safety reviewed and adequate, no other significant changes in hearing or vision, and not occasionally active with exercise.  Peak wt has been 356 Wt Readings from Last 3 Encounters:  10/11/17 (!) 307 lb (139.3 kg)  03/27/17 (!) 323 lb (146.5 kg)  09/25/16 (!) 314 lb (142.4 kg)  Fell at [facility] with knee contusion with off balance but not usually a problem.  Takes 4 advil every am due to bilat ankle pain and standing at work, and chronic right shoulder pain.  He is informed we have sports medicine available for further eval if needed, delcines for now. Past Medical History:  Diagnosis Date  . ABDOMINAL PAIN OTHER SPECIFIED SITE 07/20/2008  . CHEST PAIN 02/06/2011  . COLONIC POLYPS, HX OF 05/26/2008  . DEPRESSION 05/26/2008  . DIABETES MELLITUS, TYPE II 05/26/2008  . HYPERLIPIDEMIA 05/26/2008  . HYPERTENSION 05/26/2008  . HYPOTHYROIDISM 05/26/2008  . KNEE PAIN, RIGHT, ACUTE 07/01/2008  . Personal history of urinary calculi 05/26/2008  . UTI'S, HX OF 05/26/2008   Past Surgical History:  Procedure Laterality Date  . s/p right knee arthroplasty      complicated by patellar tendon rupture Jan 2011 Dr. Gladstone Lighter  . TONSILLECTOMY      reports that he has never smoked. He  has never used smokeless tobacco. He reports that he does not drink alcohol or use drugs. family history includes Alcohol abuse in his other; Cancer in his mother; Diabetes in his other; Heart disease in his father and mother; Hyperlipidemia in his father and mother. Allergies  Allergen Reactions  . Penicillins   . Sulfonamide Derivatives    Current Outpatient Prescriptions on File Prior to Visit  Medication Sig Dispense Refill  . clindamycin (CLEOCIN) 300 MG capsule Take 300 mg by mouth 3 (three) times daily.    . sildenafil (VIAGRA) 100 MG tablet Take 0.5-1 tablets (50-100 mg total) by mouth daily as needed. 10 tablet 0   No current facility-administered medications on file prior to visit.     Review of Systems Constitutional: Negative for other unusual diaphoresis, sweats, appetite or weight changes HENT: Negative for other worsening hearing loss, ear pain, facial swelling, mouth sores or neck stiffness.   Eyes: Negative for other worsening pain, redness or other visual disturbance.  Respiratory: Negative for other stridor or swelling Cardiovascular: Negative for other palpitations or other chest pain  Gastrointestinal: Negative for worsening diarrhea or loose stools, blood in stool, distention or other pain Genitourinary: Negative for hematuria, flank pain or other change in urine volume.  Musculoskeletal: Negative for myalgias or other joint swelling.  Skin: Negative for other color change, or other wound or worsening drainage.  Neurological: Negative for other syncope or numbness. Hematological: Negative for other adenopathy or swelling Psychiatric/Behavioral: Negative  for hallucinations, other worsening agitation, SI, self-injury, or new decreased concentration All other system neg per pt    Objective:   Physical Exam BP 106/76   Pulse (!) 57   Temp 98 F (36.7 C) (Oral)   Ht 6' (1.829 m)   Wt (!) 307 lb (139.3 kg)   SpO2 99%   BMI 41.64 kg/m  VS noted, morbid  obese Constitutional: Pt is oriented to person, place, and time. Appears well-developed and well-nourished, in no significant distress and comfortable Head: Normocephalic and atraumatic  Eyes: Conjunctivae and EOM are normal. Pupils are equal, round, and reactive to light Right Ear: External ear normal without discharge Left Ear: External ear normal without discharge Nose: Nose without discharge or deformity Mouth/Throat: Oropharynx is without other ulcerations and moist  Neck: Normal range of motion. Neck supple. No JVD present. No tracheal deviation present or significant neck LA or mass Cardiovascular: Normal rate, regular rhythm, normal heart sounds and intact distal pulses.   Pulmonary/Chest: WOB normal and breath sounds without rales or wheezing  Abdominal: Soft. Bowel sounds are normal. NT. No HSM  Musculoskeletal: Normal range of motion. Exhibits no edema Lymphadenopathy: Has no other cervical adenopathy.  Neurological: Pt is alert and oriented to person, place, and time, has decreased sens to distal LE;'s,  Pt has normal reflexes. No cranial nerve deficit. Motor grossly intact, Gait intact Skin: Skin is warm and dry. No rash noted or new ulcerations Psychiatric:  Has normal mood and affect. Behavior is normal without agitation No other exam findings Lab Results  Component Value Date   WBC 10.1 10/09/2017   HGB 14.2 10/09/2017   HCT 42.5 10/09/2017   PLT 241.0 10/09/2017   GLUCOSE 135 (H) 10/09/2017   CHOL 138 10/09/2017   TRIG 286.0 (H) 10/09/2017   HDL 32.30 (L) 10/09/2017   LDLDIRECT 78.0 10/09/2017   LDLCALC 73 03/25/2017   ALT 27 10/09/2017   AST 19 10/09/2017   NA 143 10/09/2017   K 4.1 10/09/2017   CL 104 10/09/2017   CREATININE 0.79 10/09/2017   BUN 25 (H) 10/09/2017   CO2 28 10/09/2017   TSH 2.88 10/09/2017   PSA 0.09 (L) 10/09/2017   INR 2.0 ratio (H) 01/16/2010   HGBA1C 6.7 (H) 10/09/2017   MICROALBUR 1.2 10/09/2017       Assessment & Plan:

## 2017-10-27 ENCOUNTER — Other Ambulatory Visit: Payer: Self-pay | Admitting: Internal Medicine

## 2017-11-28 ENCOUNTER — Other Ambulatory Visit: Payer: Self-pay | Admitting: Internal Medicine

## 2017-12-02 ENCOUNTER — Ambulatory Visit: Payer: Self-pay | Admitting: Podiatry

## 2017-12-04 ENCOUNTER — Other Ambulatory Visit: Payer: Self-pay | Admitting: Internal Medicine

## 2017-12-26 ENCOUNTER — Other Ambulatory Visit: Payer: Self-pay | Admitting: Internal Medicine

## 2018-01-06 ENCOUNTER — Ambulatory Visit: Payer: Self-pay | Admitting: Podiatry

## 2018-01-07 ENCOUNTER — Ambulatory Visit (INDEPENDENT_AMBULATORY_CARE_PROVIDER_SITE_OTHER): Payer: BLUE CROSS/BLUE SHIELD

## 2018-01-07 ENCOUNTER — Ambulatory Visit: Payer: BLUE CROSS/BLUE SHIELD | Admitting: Podiatry

## 2018-01-07 ENCOUNTER — Encounter: Payer: Self-pay | Admitting: Podiatry

## 2018-01-07 VITALS — BP 134/72 | HR 54 | Ht 72.0 in | Wt 306.0 lb

## 2018-01-07 DIAGNOSIS — B351 Tinea unguium: Secondary | ICD-10-CM

## 2018-01-07 DIAGNOSIS — M79674 Pain in right toe(s): Secondary | ICD-10-CM

## 2018-01-07 DIAGNOSIS — M79675 Pain in left toe(s): Secondary | ICD-10-CM | POA: Diagnosis not present

## 2018-01-07 DIAGNOSIS — M2021 Hallux rigidus, right foot: Secondary | ICD-10-CM

## 2018-01-07 DIAGNOSIS — M2022 Hallux rigidus, left foot: Secondary | ICD-10-CM

## 2018-01-07 DIAGNOSIS — M25579 Pain in unspecified ankle and joints of unspecified foot: Secondary | ICD-10-CM | POA: Diagnosis not present

## 2018-01-07 DIAGNOSIS — M19079 Primary osteoarthritis, unspecified ankle and foot: Secondary | ICD-10-CM

## 2018-01-07 DIAGNOSIS — Q828 Other specified congenital malformations of skin: Secondary | ICD-10-CM

## 2018-01-07 DIAGNOSIS — E1149 Type 2 diabetes mellitus with other diabetic neurological complication: Secondary | ICD-10-CM | POA: Diagnosis not present

## 2018-01-07 MED ORDER — GABAPENTIN 100 MG PO CAPS: 100.0000 mg | ORAL_CAPSULE | Freq: Every day | ORAL | 0 refills | Status: DC

## 2018-01-07 NOTE — Patient Instructions (Signed)
You can start gabapentin 100mg  at night time. After one week if you are tolerating well can go up to 200mg  (2 tablets) at night. On week three you can go up to 3 pills (300mg ) at night.  Gabapentin capsules or tablets What is this medicine? GABAPENTIN (GA ba pen tin) is used to control partial seizures in adults with epilepsy. It is also used to treat certain types of nerve pain. This medicine may be used for other purposes; ask your health care provider or pharmacist if you have questions. COMMON BRAND NAME(S): Active-PAC with Gabapentin, Gabarone, Neurontin What should I tell my health care provider before I take this medicine? They need to know if you have any of these conditions: -kidney disease -suicidal thoughts, plans, or attempt; a previous suicide attempt by you or a family member -an unusual or allergic reaction to gabapentin, other medicines, foods, dyes, or preservatives -pregnant or trying to get pregnant -breast-feeding How should I use this medicine? Take this medicine by mouth with a glass of water. Follow the directions on the prescription label. You can take it with or without food. If it upsets your stomach, take it with food.Take your medicine at regular intervals. Do not take it more often than directed. Do not stop taking except on your doctor's advice. If you are directed to break the 600 or 800 mg tablets in half as part of your dose, the extra half tablet should be used for the next dose. If you have not used the extra half tablet within 28 days, it should be thrown away. A special MedGuide will be given to you by the pharmacist with each prescription and refill. Be sure to read this information carefully each time. Talk to your pediatrician regarding the use of this medicine in children. Special care may be needed. Overdosage: If you think you have taken too much of this medicine contact a poison control center or emergency room at once. NOTE: This medicine is only for  you. Do not share this medicine with others. What if I miss a dose? If you miss a dose, take it as soon as you can. If it is almost time for your next dose, take only that dose. Do not take double or extra doses. What may interact with this medicine? Do not take this medicine with any of the following medications: -other gabapentin products This medicine may also interact with the following medications: -alcohol -antacids -antihistamines for allergy, cough and cold -certain medicines for anxiety or sleep -certain medicines for depression or psychotic disturbances -homatropine; hydrocodone -naproxen -narcotic medicines (opiates) for pain -phenothiazines like chlorpromazine, mesoridazine, prochlorperazine, thioridazine This list may not describe all possible interactions. Give your health care provider a list of all the medicines, herbs, non-prescription drugs, or dietary supplements you use. Also tell them if you smoke, drink alcohol, or use illegal drugs. Some items may interact with your medicine. What should I watch for while using this medicine? Visit your doctor or health care professional for regular checks on your progress. You may want to keep a record at home of how you feel your condition is responding to treatment. You may want to share this information with your doctor or health care professional at each visit. You should contact your doctor or health care professional if your seizures get worse or if you have any new types of seizures. Do not stop taking this medicine or any of your seizure medicines unless instructed by your doctor or health care professional. Stopping  your medicine suddenly can increase your seizures or their severity. Wear a medical identification bracelet or chain if you are taking this medicine for seizures, and carry a card that lists all your medications. You may get drowsy, dizzy, or have blurred vision. Do not drive, use machinery, or do anything that needs  mental alertness until you know how this medicine affects you. To reduce dizzy or fainting spells, do not sit or stand up quickly, especially if you are an older patient. Alcohol can increase drowsiness and dizziness. Avoid alcoholic drinks. Your mouth may get dry. Chewing sugarless gum or sucking hard candy, and drinking plenty of water will help. The use of this medicine may increase the chance of suicidal thoughts or actions. Pay special attention to how you are responding while on this medicine. Any worsening of mood, or thoughts of suicide or dying should be reported to your health care professional right away. Women who become pregnant while using this medicine may enroll in the [place] Pregnancy Registry by calling [phone]. This registry collects information about the safety of antiepileptic drug use during pregnancy. What side effects may I notice from receiving this medicine? Side effects that you should report to your doctor or health care professional as soon as possible: -allergic reactions like skin rash, itching or hives, swelling of the face, lips, or tongue -worsening of mood, thoughts or actions of suicide or dying Side effects that usually do not require medical attention (report to your doctor or health care professional if they continue or are bothersome): -constipation -difficulty walking or controlling muscle movements -dizziness -nausea -slurred speech -tiredness -tremors -weight gain This list may not describe all possible side effects. Call your doctor for medical advice about side effects. You may report side effects to FDA at 1-800-FDA-1088. Where should I keep my medicine? Keep out of reach of children. This medicine may cause accidental overdose and death if it taken by other adults, children, or pets. Mix any unused medicine with a substance like cat litter or coffee grounds. Then throw the medicine away in a sealed container like a  sealed bag or a coffee can with a lid. Do not use the medicine after the expiration date. Store at room temperature between 15 and 30 degrees C (59 and 86 degrees F). NOTE: This sheet is a summary. It may not cover all possible information. If you have questions about this medicine, talk to your doctor, pharmacist, or health care provider.  2018 Elsevier/Gold Standard (2014-02-05 15:26:50)

## 2018-01-07 NOTE — Progress Notes (Signed)
Subjective:    Patient ID: Leon Nelson, male    DOB: [birth year], 62 y.o.   MRN: [id number]  HPI  Chief Complaint  Patient presents with  . Diabetes      (NP) diabetic foot care/feet numb - last A1C 6.3  . Callouses    bilateral  . Nail Problem    bilateral elongated thickened toenails  . Flat Foot    bilateral - ankle pain, stands on concrete all day for work    62 year old male presents the office today for diabetic foot evaluation.  He states his nails are thick, painful, elongated cannot trim them himself.  Denies any redness or drainage or any swelling to the toenail sites.  He also gets calluses to both of his big toes which becomes thick and irritated inside of issues.  He also states that he has very flat feet his ankles roll inwards and his ankle started to hurt after working on concrete for several hours a day.  This is been an ongoing issue.  He denies any recent injury or trauma.  He also does complain of numbness and tingling to his feet which also burning at times.  He has never had treatment for neuropathy.  He has no other concerns today.  Last A1c was 6.3    Review of Systems  All other systems reviewed and are negative.  Past Medical History:  Diagnosis Date  . ABDOMINAL PAIN OTHER SPECIFIED SITE 07/20/2008  . CHEST PAIN 02/06/2011  . COLONIC POLYPS, HX OF 05/26/2008  . DEPRESSION 05/26/2008  . DIABETES MELLITUS, TYPE II 05/26/2008  . HYPERLIPIDEMIA 05/26/2008  . HYPERTENSION 05/26/2008  . HYPOTHYROIDISM 05/26/2008  . KNEE PAIN, RIGHT, ACUTE 07/01/2008  . Personal history of urinary calculi 05/26/2008  . UTI'S, HX OF 05/26/2008    Past Surgical History:  Procedure Laterality Date  . s/p right knee arthroplasty      complicated by patellar tendon rupture Jan 2011 Dr. Gladstone Lighter  . TONSILLECTOMY       Current Outpatient Medications:  .  allopurinol (ZYLOPRIM) 300 MG tablet, Take 1 tablet (300 mg total) by mouth daily. (Patient not taking: Reported on 01/07/2018), Disp:  90 tablet, Rfl: 3 .  allopurinol (ZYLOPRIM) 300 MG tablet, TAKE 1 TABLET DAILY, Disp: 90 tablet, Rfl: 3 .  atenolol-chlorthalidone (TENORETIC) 50-25 MG tablet, TAKE 1/2 TABLET BY MOUTH DAILY, Disp: 45 tablet, Rfl: 2 .  clindamycin (CLEOCIN) 300 MG capsule, Take 300 mg by mouth 3 (three) times daily., Disp: , Rfl:  .  dicyclomine (BENTYL) 20 MG tablet, Take 1 tablet (20 mg total) by mouth 3 (three) times daily as needed. (Patient not taking: Reported on 01/07/2018), Disp: 180 tablet, Rfl: 1 .  dicyclomine (BENTYL) 20 MG tablet, TAKE ONE TABLET BY MOUTH THREE TIMES DAILY AS NEEDED, Disp: 90 tablet, Rfl: 3 .  gabapentin (NEURONTIN) 100 MG capsule, Take 1 capsule (100 mg total) by mouth at bedtime., Disp: 60 capsule, Rfl: 0 .  glipiZIDE (GLUCOTROL XL) 10 MG 24 hr tablet, Take 1 tablet (10 mg total) by mouth daily with breakfast., Disp: 90 tablet, Rfl: 3 .  levothyroxine (SYNTHROID, LEVOTHROID) 50 MCG tablet, Take 1 tablet (50 mcg total) by mouth daily., Disp: 90 tablet, Rfl: 3 .  lisinopril (PRINIVIL,ZESTRIL) 20 MG tablet, TAKE 1 TABLET DAILY (YEARLY PHYSICAL IS DUE, MUST SEE MD FOR REFILLS), Disp: 90 tablet, Rfl: 3 .  metFORMIN (GLUCOPHAGE-XR) 500 MG 24 hr tablet, TAKE 3 TABLETS IN THE MORNING, Disp: 270 tablet,  Rfl: 3 .  potassium chloride (K-DUR) 10 MEQ tablet, Take 3 tablets (30 mEq total) by mouth daily., Disp: 270 tablet, Rfl: 3 .  sildenafil (VIAGRA) 100 MG tablet, Take 0.5-1 tablets (50-100 mg total) by mouth daily as needed. (Patient not taking: Reported on 01/07/2018), Disp: 10 tablet, Rfl: 0 .  simvastatin (ZOCOR) 80 MG tablet, Take 1 tablet (80 mg total) by mouth at bedtime., Disp: 90 tablet, Rfl: 3 .  venlafaxine XR (EFFEXOR-XR) 75 MG 24 hr capsule, TAKE 1 CAPSULE DAILY, Disp: 90 capsule, Rfl: 3  Allergies  Allergen Reactions  . Penicillins   . Sulfonamide Derivatives     Social History   Socioeconomic History  . Marital status: Married    Spouse name: Not on file  . Number of  children: Not on file  . Years of education: Not on file  . Highest education level: Not on file  Social Needs  . Financial resource strain: Not on file  . Food insecurity - worry: Not on file  . Food insecurity - inability: Not on file  . Transportation needs - medical: Not on file  . Transportation needs - non-medical: Not on file  Occupational History  . Not on file  Tobacco Use  . Smoking status: Never Smoker  . Smokeless tobacco: Never Used  Substance and Sexual Activity  . Alcohol use: No  . Drug use: No  . Sexual activity: Not on file  Other Topics Concern  . Not on file  Social History Narrative  . Not on file        Objective:   Physical Exam  General: AAO x3, NAD  Dermatological: Nails are hypertrophic, dystrophic, brittle, discolored, elongated 10. No surrounding redness or drainage. Tenderness nails 1-5 bilaterally.  Hyperkeratotic lesions bilateral medial hallux.  No underlying ulceration drainage or signs of infection no open lesions or pre-ulcerative lesions are identified today.  Vascular: Dorsalis Pedis artery and Posterior Tibial artery pedal pulses are 2/4 bilateral with immedate capillary fill time. There is no pain with calf compression, swelling, warmth, erythema.   Neruologic: Sensation decreased with Derrel Nip monofilament  Musculoskeletal: Significant decrease in medial arch height upon weightbearing.  Decreased subtalar joint range of motion.  No pain with ankle joint range of motion.  There is no area pinpoint bony tenderness or pain to vibratory sensation.  HAV is present as well as hammertoes.  Achilles tendon intact.  Equinus is present.  Hallux limitus present bilaterally.  Muscular strength 5/5 in all groups tested bilateral.  Gait: Unassisted, Nonantalgic.      Assessment & Plan:  62 year old male with symptomatic onychomycosis, neuropathy, flatfoot resulting in ankle pain -Treatment options discussed including all alternatives,  risks, and complications -Etiology of symptoms were discussed -X-rays were obtained and reviewed.  No evidence of acute fracture.  There is arthritic changes present of the ankle, subtalar joint as well as midfoot. -Nails debrided 10 without complications or bleeding. -Hyperkeratotic lesions were sharply debrided x2 without any complications or bleeding -Regards to the foot pain I do think he needs to wear better shoes with arch support.  We will try for diabetic shoes and paperwork completed today for precertification of diabetic shoes.  If this does not help he might need to have a brace or more supportive inserts but will start with a new diabetic shoe.  -Regards to the neuropathy symptoms that he is having is never had treatment.  Discussed treatment options he would like to start medication to help with  the tingling.  We will start gabapentin 100 mg at nighttime and titrate up based on symptoms and his tolerability of the medicine.  Discussed side effects. -Daily foot inspection -Follow-up in 3 months or sooner if any problems arise. In the meantime, encouraged to call the office with any questions, concerns, change in symptoms.   Celesta Gentile, DPM

## 2018-01-14 ENCOUNTER — Ambulatory Visit: Payer: BLUE CROSS/BLUE SHIELD | Admitting: Orthotics

## 2018-01-14 DIAGNOSIS — M25579 Pain in unspecified ankle and joints of unspecified foot: Secondary | ICD-10-CM

## 2018-01-14 DIAGNOSIS — M2022 Hallux rigidus, left foot: Principal | ICD-10-CM

## 2018-01-14 DIAGNOSIS — E1149 Type 2 diabetes mellitus with other diabetic neurological complication: Secondary | ICD-10-CM

## 2018-01-14 DIAGNOSIS — M2021 Hallux rigidus, right foot: Secondary | ICD-10-CM

## 2018-01-14 NOTE — Progress Notes (Signed)
Patient presents today for diabetic shoe measurement and foam casting.  Goals of diabetic shoes/inserts to offer protection from conditions secondary to DM2, offer relief from sheer forces that could lead to ulcerations, protect the foot, and offer greater stability. Patient is under supervision of DPM [place] Physician managing patients DM2: [name] Patient has following documented conditions to qualify for diabetic shoes/inserts: Dm2, PN, HRigidus, PPlanus Patient measured with brannock device: 11.5 W

## 2018-01-27 ENCOUNTER — Telehealth: Payer: Self-pay | Admitting: *Deleted

## 2018-01-27 ENCOUNTER — Encounter: Payer: Self-pay | Admitting: Internal Medicine

## 2018-01-27 ENCOUNTER — Ambulatory Visit: Payer: BLUE CROSS/BLUE SHIELD | Admitting: Internal Medicine

## 2018-01-27 VITALS — BP 120/78 | HR 61 | Temp 98.3°F | Ht 72.0 in | Wt 313.0 lb

## 2018-01-27 DIAGNOSIS — G8929 Other chronic pain: Secondary | ICD-10-CM | POA: Diagnosis not present

## 2018-01-27 DIAGNOSIS — M5412 Radiculopathy, cervical region: Secondary | ICD-10-CM | POA: Insufficient documentation

## 2018-01-27 DIAGNOSIS — M25511 Pain in right shoulder: Secondary | ICD-10-CM | POA: Diagnosis not present

## 2018-01-27 DIAGNOSIS — I10 Essential (primary) hypertension: Secondary | ICD-10-CM

## 2018-01-27 DIAGNOSIS — M25512 Pain in left shoulder: Secondary | ICD-10-CM

## 2018-01-27 DIAGNOSIS — R202 Paresthesia of skin: Secondary | ICD-10-CM | POA: Diagnosis not present

## 2018-01-27 MED ORDER — GABAPENTIN 100 MG PO CAPS: ORAL_CAPSULE | ORAL | 0 refills | Status: DC

## 2018-01-27 NOTE — Assessment & Plan Note (Signed)
New onset with left neck pain and LUE weakness c/w left cervical radiculopathy, cannot completely r/o left CTS but less likely, for pain control, MRI c-spine,  to f/u any worsening symptoms or concerns

## 2018-01-27 NOTE — Assessment & Plan Note (Signed)
C/w prob DJD vs tendonitis, likely a separate problem,  to f/u any worsening symptoms or concerns

## 2018-01-27 NOTE — Telephone Encounter (Signed)
Pt states Dr. Jacqualyn Posey had wanted him to take the Gabapentin 100mg  1 capsule at bed time for 7 days, then increase but he is out of the medication. I told pt I did see Dr. Leigh Aurora note to titrate up but not to how much and when per day. I told him I would order 2 tablets at bed time for 7 days and send Dr. Jacqualyn Posey a message for further instructions.

## 2018-01-27 NOTE — Patient Instructions (Addendum)
Please continue all other medications as before (inclyuding the gabapentin) and refills have been done if requested.  Please have the pharmacy call with any other refills you may need.  Please keep your appointments with your specialists as you may have planned  You will be contacted regarding the referral for: MRI  Please also wear a left wrist splint at night only for 1 week, then ok to stop if not helping

## 2018-01-27 NOTE — Assessment & Plan Note (Signed)
Consider EMG/NCS for LUE if persistent

## 2018-01-27 NOTE — Progress Notes (Signed)
Subjective:    Patient ID: Leon Nelson, male    DOB: [birth year], 62 y.o.   MRN: [id number]  HPI  Here with c/o 1 wk onset 3 fingers left hand numbness persistent without pain but has some LUE weakness and slight less grip strength, also has left postlat neck pain and left shoulder aching; mild to mod, constant, dull, nothing seems to make better or worse.  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  No prior hx cervical radiculopathy No trauma, falls and Pt denies new neurological symptoms such as new headache, or facial weakness or numbness Past Medical History:  Diagnosis Date  . ABDOMINAL PAIN OTHER SPECIFIED SITE 07/20/2008  . CHEST PAIN 02/06/2011  . COLONIC POLYPS, HX OF 05/26/2008  . DEPRESSION 05/26/2008  . DIABETES MELLITUS, TYPE II 05/26/2008  . HYPERLIPIDEMIA 05/26/2008  . HYPERTENSION 05/26/2008  . HYPOTHYROIDISM 05/26/2008  . KNEE PAIN, RIGHT, ACUTE 07/01/2008  . Personal history of urinary calculi 05/26/2008  . UTI'S, HX OF 05/26/2008   Past Surgical History:  Procedure Laterality Date  . s/p right knee arthroplasty      complicated by patellar tendon rupture Jan 2011 Dr. Gladstone Lighter  . TONSILLECTOMY      reports that  has never smoked. he has never used smokeless tobacco. He reports that he does not drink alcohol or use drugs. family history includes Alcohol abuse in his other; Cancer in his mother; Diabetes in his other; Heart disease in his father and mother; Hyperlipidemia in his father and mother. Allergies  Allergen Reactions  . Penicillins   . Sulfonamide Derivatives    Current Outpatient Medications on File Prior to Visit  Medication Sig Dispense Refill  . allopurinol (ZYLOPRIM) 300 MG tablet Take 1 tablet (300 mg total) by mouth daily. 90 tablet 3  . allopurinol (ZYLOPRIM)  300 MG tablet TAKE 1 TABLET DAILY 90 tablet 3  . atenolol-chlorthalidone (TENORETIC) 50-25 MG tablet TAKE 1/2 TABLET BY MOUTH DAILY 45 tablet 2  . clindamycin (CLEOCIN) 300 MG capsule Take 300 mg by mouth 3 (three) times daily.    Marland Kitchen dicyclomine (BENTYL) 20 MG tablet Take 1 tablet (20 mg total) by mouth 3 (three) times daily as needed. 180 tablet 1  . dicyclomine (BENTYL) 20 MG tablet TAKE ONE TABLET BY MOUTH THREE TIMES DAILY AS NEEDED 90 tablet 3  . gabapentin (NEURONTIN) 100 MG capsule Take 1 capsule (100 mg total) by mouth at bedtime. 60 capsule 0  . glipiZIDE (GLUCOTROL XL) 10 MG 24 hr tablet Take 1 tablet (10 mg total) by mouth daily with breakfast. 90 tablet 3  . levothyroxine (SYNTHROID, LEVOTHROID) 50 MCG tablet Take 1 tablet (50 mcg total) by mouth daily. 90 tablet 3  . lisinopril (PRINIVIL,ZESTRIL) 20 MG tablet TAKE 1 TABLET DAILY (YEARLY PHYSICAL IS DUE, MUST SEE MD FOR REFILLS) 90 tablet 3  . metFORMIN (GLUCOPHAGE-XR) 500 MG 24 hr tablet TAKE 3 TABLETS IN THE MORNING 270 tablet 3  . potassium chloride (K-DUR) 10 MEQ tablet Take 3 tablets (30 mEq total) by mouth daily. 270 tablet 3  . sildenafil (VIAGRA) 100 MG tablet Take 0.5-1 tablets (50-100 mg total) by mouth daily as needed. 10 tablet 0  . simvastatin (ZOCOR) 80 MG tablet Take 1 tablet (80 mg  total) by mouth at bedtime. 90 tablet 3  . venlafaxine XR (EFFEXOR-XR) 75 MG 24 hr capsule TAKE 1 CAPSULE DAILY 90 capsule 3   No current facility-administered medications on file prior to visit.    Review of Systems  Constitutional: Negative for other unusual diaphoresis or sweats HENT: Negative for ear discharge or swelling Eyes: Negative for other worsening visual disturbances Respiratory: Negative for stridor or other swelling  Gastrointestinal: Negative for worsening distension or other blood Genitourinary: Negative for retention or other urinary change Musculoskeletal: Negative for other MSK pain or swelling Skin: Negative for  color change or other new lesions Neurological: Negative for worsening tremors and other numbness  Psychiatric/Behavioral: Negative for worsening agitation or other fatigue All other system neg per pt    Objective:   Physical Exam BP 120/78   Pulse 61   Temp 98.3 F (36.8 C) (Oral)   Ht 6' (1.829 m)   Wt (!) 313 lb (142 kg)   SpO2 96%   BMI 42.45 kg/m  VS noted,  Constitutional: Pt appears in NAD HENT: Head: NCAT.  Right Ear: External ear normal.  Left Ear: External ear normal.  Eyes: . Pupils are equal, round, and reactive to light. Conjunctivae and EOM are normal Nose: without d/c or deformity Neck: Neck supple. Gross normal ROM Cardiovascular: Normal rate and regular rhythm.   Pulmonary/Chest: Effort normal and breath sounds without rales or wheezing.  Neurological: Pt is alert. At baseline orientation, motor 5/5 intact except for 4-4+/5 LUE weakness and decreased sensation distal LUE Skin: Skin is warm. No rashes, other new lesions, no LE edema Psychiatric: Pt behavior is normal without agitation  No other exam findings    Assessment & Plan:

## 2018-01-27 NOTE — Assessment & Plan Note (Signed)
stable overall by history and exam, recent data reviewed with pt, and pt to continue medical treatment as before,  to f/u any worsening symptoms or concerns BP Readings from Last 3 Encounters:  01/27/18 120/78  01/07/18 134/72  10/11/17 106/76

## 2018-01-28 MED ORDER — GABAPENTIN 100 MG PO CAPS: ORAL_CAPSULE | ORAL | 3 refills | Status: DC

## 2018-01-28 NOTE — Addendum Note (Signed)
Addended by: Harriett Sine D on: 01/28/2018 04:07 PM   Modules accepted: Orders

## 2018-01-28 NOTE — Telephone Encounter (Signed)
I informed pt, Dr. Jacqualyn Posey had ordered to go to 3 capsules gabapentin 100mg  at bedtime, and I would order now for him to pick up 01/31/2018. Pt states understanding.

## 2018-01-28 NOTE — Telephone Encounter (Signed)
Can go up to 200mg  at night then after a week can go up to 300mg  at night and then stay at that dose. If he has any side affects or problems taking the medication to let me know. Thanks.

## 2018-02-04 ENCOUNTER — Telehealth: Payer: Self-pay | Admitting: *Deleted

## 2018-02-04 MED ORDER — GABAPENTIN 300 MG PO CAPS: 300.0000 mg | ORAL_CAPSULE | Freq: Every day | ORAL | 3 refills | Status: DC

## 2018-02-04 NOTE — Telephone Encounter (Signed)
Leon Nelson - [facility] states insurance will not pay for Gabapentin 100mg  3 capsules hs, but will pay for 300mg  capsules. I okayed the change to 300mg  at hs #30 +3refills.

## 2018-02-04 NOTE — Telephone Encounter (Signed)
I'm a pt of Dr. Leigh Aurora. You all had called me some gabapentin 100 mg tablets in for 3 at bedtime. My insurance will not cover that. They said it has to be a 300 mg tablet 1 at bedtime. My pharmacy has been trying to reach you so could you please contact them back. [facility] [address]. I have been without my medication the past two days. Thank you so much and my number is [phone]. Thank you.

## 2018-02-07 ENCOUNTER — Telehealth: Payer: Self-pay | Admitting: Podiatry

## 2018-02-07 NOTE — Telephone Encounter (Signed)
Left message for pt that diabetic shoes are not in. They were on back order until 2.14.19. We need to cxl his appt for 2.19.19 and to call and r/s or if he would like I can call pt to schedule an appt when they come in.

## 2018-02-10 ENCOUNTER — Other Ambulatory Visit: Payer: Self-pay | Admitting: Internal Medicine

## 2018-02-11 ENCOUNTER — Ambulatory Visit: Payer: BLUE CROSS/BLUE SHIELD | Admitting: Orthotics

## 2018-02-11 DIAGNOSIS — E1149 Type 2 diabetes mellitus with other diabetic neurological complication: Secondary | ICD-10-CM

## 2018-02-11 DIAGNOSIS — M2022 Hallux rigidus, left foot: Principal | ICD-10-CM

## 2018-02-11 DIAGNOSIS — M2021 Hallux rigidus, right foot: Secondary | ICD-10-CM

## 2018-02-11 NOTE — Progress Notes (Signed)
Shoes weren't in. Patient was understanding.  Called [facility] and was told they would ship this week.Marland Kitchenadvised patient the same.

## 2018-02-20 ENCOUNTER — Telehealth: Payer: Self-pay | Admitting: Internal Medicine

## 2018-02-20 NOTE — Telephone Encounter (Signed)
Copied from [place]. Topic: Quick Communication - See Telephone Encounter >> Feb 20, 2018  9:24 AM Boyd Kerbs wrote: CRM for notification. See Telephone encounter for:   Pt has appt. This Saturday for [facility] and he is very claustrophobic and was told to call doctor and get something called in for him   [id number] [address], [place] [address] Phone: [phone] Fax: [phone]    02/20/18.

## 2018-02-21 MED ORDER — DIAZEPAM 5 MG PO TABS: ORAL_TABLET | ORAL | 0 refills | Status: DC

## 2018-02-21 NOTE — Telephone Encounter (Signed)
Notified pt [facility]xt[facility].../lmb 

## 2018-02-21 NOTE — Telephone Encounter (Signed)
Additional CRM: Pt call to follow up on his request for a med that he needs for a MRI he is claustrophobic

## 2018-02-21 NOTE — Telephone Encounter (Signed)
Done erx 

## 2018-02-22 ENCOUNTER — Ambulatory Visit
Admission: RE | Admit: 2018-02-22 | Discharge: 2018-02-22 | Disposition: A | Discharge status: Home / Self Care | Payer: BLUE CROSS/BLUE SHIELD | Source: Ambulatory Visit | Attending: Internal Medicine | Admitting: Internal Medicine

## 2018-02-22 DIAGNOSIS — M5412 Radiculopathy, cervical region: Secondary | ICD-10-CM

## 2018-02-22 DIAGNOSIS — M4802 Spinal stenosis, cervical region: Secondary | ICD-10-CM | POA: Diagnosis not present

## 2018-02-24 ENCOUNTER — Other Ambulatory Visit: Payer: Self-pay | Admitting: Internal Medicine

## 2018-02-24 ENCOUNTER — Telehealth: Payer: Self-pay

## 2018-02-24 ENCOUNTER — Ambulatory Visit (INDEPENDENT_AMBULATORY_CARE_PROVIDER_SITE_OTHER): Payer: BLUE CROSS/BLUE SHIELD | Admitting: Podiatry

## 2018-02-24 ENCOUNTER — Encounter: Payer: Self-pay | Admitting: Internal Medicine

## 2018-02-24 ENCOUNTER — Encounter: Payer: Self-pay | Admitting: Podiatry

## 2018-02-24 DIAGNOSIS — Q828 Other specified congenital malformations of skin: Secondary | ICD-10-CM | POA: Diagnosis not present

## 2018-02-24 DIAGNOSIS — E1149 Type 2 diabetes mellitus with other diabetic neurological complication: Secondary | ICD-10-CM

## 2018-02-24 DIAGNOSIS — M2141 Flat foot [pes planus] (acquired), right foot: Secondary | ICD-10-CM

## 2018-02-24 DIAGNOSIS — M2021 Hallux rigidus, right foot: Secondary | ICD-10-CM

## 2018-02-24 DIAGNOSIS — M19079 Primary osteoarthritis, unspecified ankle and foot: Secondary | ICD-10-CM

## 2018-02-24 DIAGNOSIS — M5412 Radiculopathy, cervical region: Secondary | ICD-10-CM

## 2018-02-24 DIAGNOSIS — M2142 Flat foot [pes planus] (acquired), left foot: Secondary | ICD-10-CM

## 2018-02-24 DIAGNOSIS — M2022 Hallux rigidus, left foot: Secondary | ICD-10-CM

## 2018-02-24 NOTE — Telephone Encounter (Signed)
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  We will need to refer to Neurosurgury for further consideration.  You should hear from the office as well.    Earlisha Sharples to please inform pt, I will do referral

## 2018-02-24 NOTE — Telephone Encounter (Signed)
Pt has been informed and expressed understanding.  

## 2018-02-25 NOTE — Progress Notes (Signed)
Subjective: Leon Nelson presents the office today to pick up diabetic shoes.  He also has some calluses in both of his big toes that have trimmed.  He is currently taking gabapentin 300 mg at nighttime has not been helping much.  His last A1c was about 6.7 and he recalls that he does not check his blood sugar on a regular basis.  He has no other concerns today. Denies any systemic complaints such as fevers, chills, nausea, vomiting. No acute changes since last appointment, and no other complaints at this time.   Objective: AAO x3, NAD DP/PT pulses palpable bilaterally, CRT less than 3 seconds Decrease in medial arch height bilaterally.  There is hyperkeratotic lesions present to the medial hallux bilaterally.  Upon debridement there is no underlying ulceration, drainage or any signs of infection.  The nails are mildly elongated discolored and hypertrophic.  No pain in the nails today there is no surrounding redness or drainage or any clinical signs of infection noted.  There is decreased subtalar joint range of motion bilaterally.  Decrease in first MTPJ range of motion.  There is no pain with ankle or subtalar joint range of motion.  There is no area of tenderness identified to bilateral lower extremities today.  No open lesions or pre-ulcerative lesions.  No pain with calf compression, swelling, warmth, erythema  Assessment: 62 year old male with bilateral foot pain likely biomechanical in nature, neuropathy, hyperkeratotic lesions  Plan: -All treatment options discussed with the patient including all alternatives, risks, complications.  -Diabetic shoes were dispensed today.  They were fitted and they appear to be fitting well not causing any pressure sores.  We discussed the break-in.  And if there is any issues he is to hold off on wearing them and call us.  He verbally understood this.  Hopefully this will help with his foot pain.  If not we discussed bracing in the future if needed but I would like to  hold off on that and continue with supportive shoe and insert if possible. -Gabapentin is not helping his feet although he has noticed that helped his hand.  He has an MRI of his neck.  Keep gabapentin the same dose of enalapril issues.  Discussed that it does not help in the feet will discontinue this. -I debrided the hyperkeratotic lesions x2 without any complications or bleeding -I did sharply debride the nails today as there is somewhat elongated. -Follow-up in 6 weeks or sooner if any issues are to arise.  Call any questions or concerns.  At that point we will see how that she is been doing his foot pain is. -Patient encouraged to call the office with any questions, concerns, change in symptoms.   Trula Slade DPM

## 2018-03-03 DIAGNOSIS — Z6841 Body Mass Index (BMI) 40.0 and over, adult: Secondary | ICD-10-CM | POA: Diagnosis not present

## 2018-03-03 DIAGNOSIS — M542 Cervicalgia: Secondary | ICD-10-CM | POA: Diagnosis not present

## 2018-03-03 DIAGNOSIS — R200 Anesthesia of skin: Secondary | ICD-10-CM | POA: Diagnosis not present

## 2018-03-20 DIAGNOSIS — G5602 Carpal tunnel syndrome, left upper limb: Secondary | ICD-10-CM | POA: Diagnosis not present

## 2018-03-20 DIAGNOSIS — M9981 Other biomechanical lesions of cervical region: Secondary | ICD-10-CM | POA: Diagnosis not present

## 2018-03-22 ENCOUNTER — Other Ambulatory Visit: Payer: Self-pay | Admitting: Internal Medicine

## 2018-04-07 ENCOUNTER — Ambulatory Visit: Payer: BLUE CROSS/BLUE SHIELD | Admitting: Podiatry

## 2018-04-07 DIAGNOSIS — M7750 Other enthesopathy of unspecified foot: Secondary | ICD-10-CM

## 2018-04-07 DIAGNOSIS — M19079 Primary osteoarthritis, unspecified ankle and foot: Secondary | ICD-10-CM | POA: Diagnosis not present

## 2018-04-07 DIAGNOSIS — E1149 Type 2 diabetes mellitus with other diabetic neurological complication: Secondary | ICD-10-CM | POA: Diagnosis not present

## 2018-04-07 DIAGNOSIS — M779 Enthesopathy, unspecified: Secondary | ICD-10-CM

## 2018-04-09 NOTE — Progress Notes (Signed)
Subjective: 62 year old male presents the office today for follow-up evaluation of bilateral foot and ankle pain.  He states that diabetic shoes are helping his foot he really continues to have pain to his ankles.  He states that the pain to his ankles limited activity at times and he has difficulty working.  No recent injury or trauma.  This been ongoing for some time without any significant improvement.  He also states that the gabapentin has been helping his feet more since I last saw him.  Denies any systemic complaints such as fevers, chills, nausea, vomiting. No acute changes since last appointment, and no other complaints at this time.   Objective: AAO x3, NAD DP/PT pulses palpable bilaterally, CRT less than 3 seconds There is tenderness along bilateral ankle joints mostly in the anterior ankle joint line.  There is no area pinpoint bony tenderness or pain to vibratory sensation identified bilaterally.  There is restriction of motion of the subtalar joint as well as to the ankle joint.  There is a significant flatfoot deformity present bilaterally.  No open lesions or pre-ulcerative lesions.  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  At this point given his ongoing symptoms we discussed there should not C.  Discussed custom brace but he was hesitant to do this.  We discussed the Tri-Lock ankle brace and I dispensed one for bilateral lower extremities to see if this will help.  Also discussed continue with his diabetic shoes.  Monitor the skin daily for is no breakdown or irritation of the brace.  Continue gabapentin for neuropathy this is been helping we will continue the current dose.- -Patient encouraged to call the office with any questions, concerns, change in symptoms.   Trula Slade  DPM

## 2018-04-12 ENCOUNTER — Other Ambulatory Visit: Payer: Self-pay | Admitting: Internal Medicine

## 2018-04-15 ENCOUNTER — Ambulatory Visit: Payer: BLUE CROSS/BLUE SHIELD | Admitting: Internal Medicine

## 2018-04-17 ENCOUNTER — Other Ambulatory Visit (INDEPENDENT_AMBULATORY_CARE_PROVIDER_SITE_OTHER): Payer: BLUE CROSS/BLUE SHIELD

## 2018-04-17 ENCOUNTER — Ambulatory Visit: Payer: BLUE CROSS/BLUE SHIELD | Admitting: Internal Medicine

## 2018-04-17 ENCOUNTER — Encounter: Payer: Self-pay | Admitting: Internal Medicine

## 2018-04-17 VITALS — BP 126/82 | HR 60 | Temp 97.8°F | Ht 72.0 in | Wt 310.0 lb

## 2018-04-17 DIAGNOSIS — Z114 Encounter for screening for human immunodeficiency virus [HIV]: Secondary | ICD-10-CM | POA: Diagnosis not present

## 2018-04-17 DIAGNOSIS — I10 Essential (primary) hypertension: Secondary | ICD-10-CM | POA: Diagnosis not present

## 2018-04-17 DIAGNOSIS — E1149 Type 2 diabetes mellitus with other diabetic neurological complication: Secondary | ICD-10-CM

## 2018-04-17 DIAGNOSIS — M109 Gout, unspecified: Secondary | ICD-10-CM | POA: Diagnosis not present

## 2018-04-17 DIAGNOSIS — E785 Hyperlipidemia, unspecified: Secondary | ICD-10-CM

## 2018-04-17 DIAGNOSIS — Z0000 Encounter for general adult medical examination without abnormal findings: Secondary | ICD-10-CM

## 2018-04-17 DIAGNOSIS — M5412 Radiculopathy, cervical region: Secondary | ICD-10-CM

## 2018-04-17 LAB — BASIC METABOLIC PANEL
BUN: 21 mg/dL (ref 6–23)
CALCIUM: 9.1 mg/dL (ref 8.4–10.5)
CHLORIDE: 102 meq/L (ref 96–112)
CO2: 29 mEq/L (ref 19–32)
CREATININE: 0.76 mg/dL (ref 0.40–1.50)
GFR: 109.98 mL/min (ref >60.00)
Glucose, Bld: 199 mg/dL — ABNORMAL HIGH (ref 70–99)
Potassium: 3.5 mEq/L (ref 3.5–5.1)
Sodium: 140 mEq/L (ref 135–145)

## 2018-04-17 LAB — LIPID PANEL
CHOL/HDL RATIO: 4
CHOLESTEROL: 130 mg/dL (ref 0–200)
HDL: 32.1 mg/dL — ABNORMAL LOW (ref >39.00)
NonHDL: 97.77
Triglycerides: 250 mg/dL — ABNORMAL HIGH (ref 0.0–149.0)
VLDL: 50 mg/dL — ABNORMAL HIGH (ref 0.0–40.0)

## 2018-04-17 LAB — HEMOGLOBIN A1C: Hgb A1c MFr Bld: 6.8 % — ABNORMAL HIGH (ref 4.6–6.5)

## 2018-04-17 LAB — HEPATIC FUNCTION PANEL
ALBUMIN: 4.2 g/dL (ref 3.5–5.2)
ALK PHOS: 50 U/L (ref 39–117)
ALT: 29 U/L (ref 0–53)
AST: 21 U/L (ref 0–37)
BILIRUBIN DIRECT: 0.1 mg/dL (ref 0.0–0.3)
TOTAL PROTEIN: 7.1 g/dL (ref 6.0–8.3)
Total Bilirubin: 0.4 mg/dL (ref 0.2–1.2)

## 2018-04-17 LAB — LDL CHOLESTEROL, DIRECT: LDL DIRECT: 78 mg/dL

## 2018-04-17 LAB — URIC ACID: Uric Acid, Serum: 5.4 mg/dL (ref 4.0–7.8)

## 2018-04-17 MED ORDER — METHYLPREDNISOLONE ACETATE 80 MG/ML IJ SUSP
80.0000 mg | Freq: Once | INTRAMUSCULAR | Status: AC
  Administered 2018-04-17: 80 mg via INTRAMUSCULAR

## 2018-04-17 MED ORDER — PREDNISONE 10 MG PO TABS: ORAL_TABLET | ORAL | 0 refills | Status: DC

## 2018-04-17 MED ORDER — TRAMADOL HCL 50 MG PO TABS: 50.0000 mg | ORAL_TABLET | Freq: Four times a day (QID) | ORAL | 0 refills | Status: DC | PRN

## 2018-04-17 NOTE — Patient Instructions (Signed)
You had the steroid shot today  Please take all new medication as prescribed - the prednisone, and pain medication  Please continue all other medications as before, and refills   Please have the pharmacy call with any other refills you may need.  Please continue your efforts at being more active, low cholesterol diet, and weight control.  You are otherwise up to date with prevention measures today.  Please keep your appointments with your specialists as you may have planned  You are given the work note  Please go to the LAB in the Basement (turn left off the elevator) for the tests to be done today  You will be contacted by phone if any changes need to be made immediately.  Otherwise, you will receive a letter about your results with an explanation, but please check with MyChart first.  Please remember to sign up for MyChart if you have not done so, as this will be important to you in the future with finding out test results, communicating by private email, and scheduling acute appointments online when needed.  Please return in 6 months, or sooner if needed, with Lab testing done 3-5 days before

## 2018-04-17 NOTE — Assessment & Plan Note (Signed)
stable overall by history and exam, recent data reviewed with pt, and pt to continue medical treatment as before,  to f/u any worsening symptoms or concerns Lab Results  Component Value Date   LDLCALC 73 03/25/2017

## 2018-04-17 NOTE — Assessment & Plan Note (Signed)
stable overall by history and exam, recent data reviewed with pt, and pt to continue medical treatment as before,  to f/u any worsening symptoms or concerns, for f/u lab 

## 2018-04-17 NOTE — Assessment & Plan Note (Signed)
Mild to mod, for depomedrol IM 80, predpac asd, work note,  to f/u any worsening symptoms or concerns

## 2018-04-17 NOTE — Assessment & Plan Note (Signed)
stable overall by history and exam, recent data reviewed with pt, and pt to continue medical treatment as before,  to f/u any worsening symptoms or concerns BP Readings from Last 3 Encounters:  04/17/18 126/82  01/27/18 120/78  01/07/18 134/72

## 2018-04-17 NOTE — Progress Notes (Signed)
Subjective:    Patient ID: Leon Nelson, male    DOB: [birth year], 62 y.o.   MRN: [id number]  HPI  Here with acute onset several days bilat ankle pain, mod to severe this am, the right with swelling as well, with some warmth. Has hx of remote gout on allopurinol but no recent attacks, and previous ones were distal right foot.  No fever, trauma, injury.  No known prior hx of DJD.  Did [facility], given brace but tylenol arthritis no longer helping especially in last few days, in fact had to scoot on the floor to the BR on getting up this AM b/c simply could not stand;  Missed work when he normally never misses.  Has lost a few lbs  Wt Readings from Last 3 Encounters:  04/17/18 (!) 310 lb (140.6 kg)  01/27/18 (!) 313 lb (142 kg)  01/07/18 (!) 306 lb (138.8 kg)  Good compliance with gout meds, last attack was "30 yrs ago".  No fever, trauma, falls, or injury. 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  Had NCS but not sure of the results and plans to f/u with ortho.  Asks for work note.   Pt denies polydipsia, polyuria    Past Medical History:  Diagnosis Date  . ABDOMINAL PAIN OTHER SPECIFIED SITE 07/20/2008  . CHEST PAIN 02/06/2011  . COLONIC POLYPS, HX OF 05/26/2008  . DEPRESSION 05/26/2008  . DIABETES MELLITUS, TYPE II 05/26/2008  . HYPERLIPIDEMIA 05/26/2008  . HYPERTENSION 05/26/2008  . HYPOTHYROIDISM 05/26/2008  . KNEE PAIN, RIGHT, ACUTE 07/01/2008  . Personal history of urinary calculi 05/26/2008  . UTI'S, HX OF 05/26/2008   Past Surgical History:  Procedure Laterality Date  . s/p right knee arthroplasty      complicated by patellar tendon rupture Jan 2011 Dr. Gladstone Lighter  . TONSILLECTOMY      reports that he has never smoked. He has never used smokeless tobacco. He reports that he does not drink alcohol  or use drugs. family history includes Alcohol abuse in his other; Cancer in his mother; Diabetes in his other; Heart disease in his father and mother; Hyperlipidemia in his father and mother. Allergies  Allergen Reactions  . Penicillins   . Sulfonamide Derivatives    Current Outpatient Medications on File Prior to Visit  Medication Sig Dispense Refill  . allopurinol (ZYLOPRIM) 300 MG tablet Take 1 tablet (300 mg total) by mouth daily. 90 tablet 3  . atenolol-chlorthalidone (TENORETIC) 50-25 MG tablet TAKE 1/2 TABLET BY MOUTH DAILY 45 tablet 2  . clindamycin (CLEOCIN) 300 MG capsule Take 300 mg by mouth 3 (three) times daily.    . diazepam (VALIUM) 5 MG tablet 1 tab by mouth 45 min prior to procedure 1 tablet 0  . dicyclomine (BENTYL) 20 MG tablet Take 1 tablet (20 mg total) by mouth 3 (three) times daily as needed. 180 tablet 1  . gabapentin (NEURONTIN) 100 MG capsule Take 1 capsule (100 mg total) by mouth at bedtime. 60 capsule 0  . gabapentin (NEURONTIN) 300 MG capsule Take 1 capsule (300 mg total) by mouth at bedtime. 30 capsule 3  . glipiZIDE (GLUCOTROL XL) 10 MG 24 hr tablet TAKE 1 TABLET DAILY WITH BREAKFAST 90 tablet 1  . levothyroxine (SYNTHROID,  LEVOTHROID) 50 MCG tablet TAKE 1 TABLET DAILY 90 tablet 2  . lisinopril (PRINIVIL,ZESTRIL) 20 MG tablet TAKE 1 TABLET DAILY (YEARLY PHYSICAL IS DUE, MUST SEE MD FOR REFILLS) 90 tablet 3  . metFORMIN (GLUCOPHAGE-XR) 500 MG 24 hr tablet TAKE 3 TABLETS IN THE MORNING 270 tablet 3  . potassium chloride (K-DUR) 10 MEQ tablet Take 3 tablets (30 mEq total) by mouth daily. 270 tablet 3  . potassium chloride SA (K-DUR,KLOR-CON) 20 MEQ tablet TAKE 2 TABLETS DAILY 180 tablet 2  . sildenafil (VIAGRA) 100 MG tablet Take 0.5-1 tablets (50-100 mg total) by mouth daily as needed. 10 tablet 0  . simvastatin (ZOCOR) 80 MG tablet TAKE 1 TABLET AT BEDTIME 90 tablet 1  . venlafaxine XR (EFFEXOR-XR) 75 MG 24 hr capsule TAKE 1 CAPSULE DAILY 90 capsule 3   No  current facility-administered medications on file prior to visit.    Review of Systems  Constitutional: Negative for other unusual diaphoresis or sweats HENT: Negative for ear discharge or swelling Eyes: Negative for other worsening visual disturbances Respiratory: Negative for stridor or other swelling  Gastrointestinal: Negative for worsening distension or other blood Genitourinary: Negative for retention or other urinary change Musculoskeletal: Negative for other MSK pain or swelling Skin: Negative for color change or other new lesions Neurological: Negative for worsening tremors and other numbness  Psychiatric/Behavioral: Negative for worsening agitation or other fatigue All other system neg per pt    Objective:   Physical Exam BP 126/82   Pulse 60   Temp 97.8 F (36.6 C) (Oral)   Ht 6' (1.829 m)   Wt (!) 310 lb (140.6 kg)   SpO2 96%   BMI 42.04 kg/m  VS noted,  Constitutional: Pt appears in NAD HENT: Head: NCAT.  Right Ear: External ear normal.  Left Ear: External ear normal.  Eyes: . Pupils are equal, round, and reactive to light. Conjunctivae and EOM are normal Nose: without d/c or deformity Neck: Neck supple. Gross normal ROM Cardiovascular: Normal rate and regular rhythm.   Pulmonary/Chest: Effort normal and breath sounds without rales or wheezing.  Right ankle with 2+ effusion, marked tender, swelling o/w stable Left ankle with no effusion or tenderness.  Neurological: Pt is alert. At baseline orientation, motor grossly intact Skin: Skin is warm. No rashes, other new lesions, no LE edema Psychiatric: Pt behavior is normal without agitation  No other exam findings    Assessment & Plan:

## 2018-04-18 LAB — HIV ANTIBODY (ROUTINE TESTING W REFLEX): HIV 1&2 Ab, 4th Generation: NONREACTIVE

## 2018-04-25 ENCOUNTER — Ambulatory Visit: Payer: BLUE CROSS/BLUE SHIELD | Admitting: Internal Medicine

## 2018-06-03 ENCOUNTER — Other Ambulatory Visit: Payer: Self-pay | Admitting: Podiatry

## 2018-07-07 ENCOUNTER — Ambulatory Visit: Payer: BLUE CROSS/BLUE SHIELD | Admitting: Podiatry

## 2018-07-24 ENCOUNTER — Encounter: Payer: Self-pay | Admitting: Podiatry

## 2018-07-24 ENCOUNTER — Ambulatory Visit: Payer: BLUE CROSS/BLUE SHIELD | Admitting: Podiatry

## 2018-07-24 DIAGNOSIS — M779 Enthesopathy, unspecified: Secondary | ICD-10-CM

## 2018-07-24 DIAGNOSIS — M19079 Primary osteoarthritis, unspecified ankle and foot: Secondary | ICD-10-CM

## 2018-07-24 MED ORDER — TRIAMCINOLONE ACETONIDE 10 MG/ML IJ SUSP
10.0000 mg | Freq: Once | INTRAMUSCULAR | Status: AC
  Administered 2018-07-24: 10 mg

## 2018-07-24 NOTE — Progress Notes (Signed)
Subjective: 62 year old male presents the office today for evaluation of bilateral flatfoot, ankle pain.  He states the pain is about the same.  He is not able to get the ankle brace on that because it is difficult for him.  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  He feels that the pain is getting somewhat worse more to the outside aspect of the ankle.  No recent injury or falls. Denies any systemic complaints such as fevers, chills, nausea, vomiting. No acute changes since last appointment, and no other complaints at this time.   Objective: AAO x3, NAD DP/PT pulses palpable bilaterally, CRT less than 3 seconds There is tenderness palpation mostly on the lateral aspect of the feet bilaterally on the sinus tarsi there is localized edema but there is no erythema or increase in warmth.  Significant flatfoot deformity is present.  There is no area pinpoint tenderness.  Achilles tendon, plantar fascia, flexor, extensor tendons appear to be intact. No open lesions or pre-ulcerative lesions.  No pain with calf compression, swelling, warmth, erythema  Assessment: Subtalar joint capsulitis to reflect the deformity  Plan: -All treatment options discussed with the patient including all alternatives, risks, complications.  -At this time we discussed steroid injections bilaterally to the sinus tarsi and he wishes to proceed.  See procedure note below.  Continue wearing supportive shoes we also discussed a more custom brace but he may not build to wear this.  Continue with supportive shoes as well.  Discussed custom orthotics. -Patient encouraged to call the office with any questions, concerns, change in symptoms.   Procedure: Injection intermediate joint Discussed alternatives, risks, complications and verbal consent was obtained.  Location: Bilateral sinus tarsi Skin Prep: Betadine. Injectate: 0.5cc 0.5% marcaine plain, 0.5 cc 2% lidocaine plain and, 1 cc kenalog 10. Disposition: Patient tolerated  procedure well. Injection site dressed with a band-aid.  Post-injection care was discussed and return precautions discussed.   Trula Slade DPM

## 2018-09-04 ENCOUNTER — Encounter: Payer: Self-pay | Admitting: Podiatry

## 2018-09-04 ENCOUNTER — Ambulatory Visit: Payer: BLUE CROSS/BLUE SHIELD | Admitting: Podiatry

## 2018-09-04 DIAGNOSIS — R2681 Unsteadiness on feet: Secondary | ICD-10-CM | POA: Diagnosis not present

## 2018-09-04 DIAGNOSIS — M7750 Other enthesopathy of unspecified foot: Secondary | ICD-10-CM

## 2018-09-04 DIAGNOSIS — M25572 Pain in left ankle and joints of left foot: Secondary | ICD-10-CM

## 2018-09-04 DIAGNOSIS — M25571 Pain in right ankle and joints of right foot: Secondary | ICD-10-CM

## 2018-09-04 DIAGNOSIS — E1149 Type 2 diabetes mellitus with other diabetic neurological complication: Secondary | ICD-10-CM | POA: Diagnosis not present

## 2018-09-04 DIAGNOSIS — M19079 Primary osteoarthritis, unspecified ankle and foot: Secondary | ICD-10-CM

## 2018-09-04 DIAGNOSIS — G8929 Other chronic pain: Secondary | ICD-10-CM

## 2018-09-04 DIAGNOSIS — M779 Enthesopathy, unspecified: Secondary | ICD-10-CM | POA: Diagnosis not present

## 2018-09-04 NOTE — Progress Notes (Signed)
Subjective: 61 year old male presents the office today for follow-up evaluation of pain to both of his feet and ankles.  Points on the medial and lateral aspect the ankle is reduced majority of symptoms he says both feet are hurting about the same.  The pain continues to worsen he states that after working his difficulty into his car due to the pain after being on his feet.  He tried a Tri-Lock ankle brace is that he cannot get them on.  He also purchased some over-the-counter races which were not helpful.  He has no other concerns today. Denies any systemic complaints such as fevers, chills, nausea, vomiting. No acute changes since last appointment, and no other complaints at this time.   Objective: AAO x3, NAD DP/PT pulses palpable bilaterally, CRT less than 3 seconds Significant flatfoot deformities present bilaterally there is tenderness of the sinus tarsi but also along the course of the flexor tendons, posterior tibial tendon on the medial aspect of the ankle.  Decreased range of motion of subtalar joint.  Ankle joint range of motion intact.  No area pinpoint tenderness. No open lesions or pre-ulcerative lesions.  No pain with calf compression, swelling, warmth, erythema  Assessment: Bilateral ankle and foot pain due to flatfoot deformity  Plan: -All treatment options discussed with the patient including all alternatives, risks, complications.  -Estimated discussion regards to both conservative as well as surgical treatment options.  As his pain is getting worse we discussed surgical intervention with a CT scan to evaluate the joints.  We also discussed other measures including an [place] brace.  He states that he could do surgery take time off of work but he wants to start with a month surgery options.  I have [name] evaluate him today and will check insurance coverage for possibly a brace or an orthotic.  He has multiple orthotics today.  Check orthotic insurance coverage before ordering.  At  this point I am thinking that some toe joint arthrodesis arthrodesis of the feet continue CT scan. -Patient encouraged to call the office with any questions, concerns, change in symptoms.   Trula Slade DPM

## 2018-09-10 ENCOUNTER — Telehealth: Payer: Self-pay | Admitting: Podiatry

## 2018-09-10 NOTE — Telephone Encounter (Signed)
Called pt to give orthotic benefit coverage and voicemail is full.

## 2018-09-24 ENCOUNTER — Other Ambulatory Visit: Payer: Self-pay | Admitting: Internal Medicine

## 2018-09-27 ENCOUNTER — Other Ambulatory Visit: Payer: Self-pay | Admitting: Internal Medicine

## 2018-09-29 NOTE — Telephone Encounter (Signed)
Done erx 

## 2018-10-02 ENCOUNTER — Ambulatory Visit: Payer: BLUE CROSS/BLUE SHIELD | Admitting: Orthotics

## 2018-10-02 DIAGNOSIS — M7750 Other enthesopathy of unspecified foot: Secondary | ICD-10-CM

## 2018-10-02 DIAGNOSIS — M25571 Pain in right ankle and joints of right foot: Secondary | ICD-10-CM

## 2018-10-02 DIAGNOSIS — G8929 Other chronic pain: Secondary | ICD-10-CM

## 2018-10-02 DIAGNOSIS — E1149 Type 2 diabetes mellitus with other diabetic neurological complication: Secondary | ICD-10-CM

## 2018-10-02 DIAGNOSIS — M25572 Pain in left ankle and joints of left foot: Secondary | ICD-10-CM

## 2018-10-02 NOTE — Progress Notes (Signed)
Patient came in today to pick up custom made foot orthotics.  The goals were accomplished and the patient reported no dissatisfaction with said orthotics.  Patient was advised of breakin period and how to report any issues. 

## 2018-10-08 ENCOUNTER — Telehealth: Payer: Self-pay | Admitting: Podiatry

## 2018-10-08 NOTE — Telephone Encounter (Signed)
I recently got a pair of orthotics and they still are not helping. I'm still having pain at night and in the morning. Dr. Jacqualyn Posey had mentioned something about a surgery and I have questions about that. If you could call me when you have a chance. Thank you.

## 2018-10-09 ENCOUNTER — Ambulatory Visit (INDEPENDENT_AMBULATORY_CARE_PROVIDER_SITE_OTHER): Payer: BLUE CROSS/BLUE SHIELD

## 2018-10-09 ENCOUNTER — Ambulatory Visit: Payer: BLUE CROSS/BLUE SHIELD | Admitting: Podiatry

## 2018-10-09 DIAGNOSIS — M79671 Pain in right foot: Secondary | ICD-10-CM

## 2018-10-09 DIAGNOSIS — M779 Enthesopathy, unspecified: Secondary | ICD-10-CM | POA: Diagnosis not present

## 2018-10-09 DIAGNOSIS — M2141 Flat foot [pes planus] (acquired), right foot: Secondary | ICD-10-CM

## 2018-10-09 DIAGNOSIS — M19079 Primary osteoarthritis, unspecified ankle and foot: Secondary | ICD-10-CM | POA: Diagnosis not present

## 2018-10-09 DIAGNOSIS — M79672 Pain in left foot: Secondary | ICD-10-CM

## 2018-10-09 DIAGNOSIS — M2142 Flat foot [pes planus] (acquired), left foot: Secondary | ICD-10-CM | POA: Diagnosis not present

## 2018-10-09 NOTE — Telephone Encounter (Signed)
Patient came into the office today to see Dr Jacqualyn Posey. Leon Nelson

## 2018-10-10 ENCOUNTER — Other Ambulatory Visit: Payer: Self-pay | Admitting: Podiatry

## 2018-10-10 ENCOUNTER — Telehealth: Payer: Self-pay | Admitting: *Deleted

## 2018-10-10 DIAGNOSIS — M25572 Pain in left ankle and joints of left foot: Secondary | ICD-10-CM

## 2018-10-10 DIAGNOSIS — G8929 Other chronic pain: Secondary | ICD-10-CM

## 2018-10-10 DIAGNOSIS — M19079 Primary osteoarthritis, unspecified ankle and foot: Secondary | ICD-10-CM

## 2018-10-10 DIAGNOSIS — M7750 Other enthesopathy of unspecified foot: Secondary | ICD-10-CM

## 2018-10-10 DIAGNOSIS — M79672 Pain in left foot: Secondary | ICD-10-CM

## 2018-10-10 DIAGNOSIS — M779 Enthesopathy, unspecified: Secondary | ICD-10-CM

## 2018-10-10 DIAGNOSIS — M25571 Pain in right ankle and joints of right foot: Secondary | ICD-10-CM

## 2018-10-10 NOTE — Telephone Encounter (Signed)
-----   Message from Trula Slade, DPM sent at 10/09/2018 10:10 AM EDT ----- Can you please order a CT scan of his left ankle for surgical planning due to arthritis? Thanks.

## 2018-10-12 NOTE — Progress Notes (Signed)
Subjective: 62 year old male presents the office today for concerns of continued worsening pain to both of his feet and ankles.  He states that he is going to the point where he cannot walk afterwards work due to the pain in the foot and ankles.  He states he had a significantly limit his activity level he is having difficulty to walk.  The inserts are not helpful.  This point he wants to discuss possible surgical intervention. Denies any systemic complaints such as fevers, chills, nausea, vomiting. No acute changes since last appointment, and no other complaints at this time.   Objective: AAO x3, NAD DP/PT pulses palpable bilaterally, CRT less than 3 seconds There is severe flexion deformity present bilaterally.  There is diffuse tenderness to the left foot and ankle of the left side mildly worse than the right.  Tenderness is along the lateral aspect of the peroneal sinus tarsi as well as the medial aspect of the ankle and foot.  There is no area pinpoint bony tenderness.  No significant edema, erythema bilaterally. No open lesions or pre-ulcerative lesions.  No pain with calf compression, swelling, warmth, erythema  Assessment: Severe flatfoot deformity bilaterally with pain  Plan: -All treatment options discussed with the patient including all alternatives, risks, complications.  -At this point we have attempted numerous conservative options were a separate improvement despite this he continues to have lifestyle limiting pain.  Due to this I have ordered a CT scan of the left ankle and foot to evaluate the integrity of the bone and joint for possible surgical intervention.  Discussed possibly triple arthrodesis. -Patient encouraged to call the office with any questions, concerns, change in symptoms.   Trula Slade DPM

## 2018-10-13 NOTE — Telephone Encounter (Signed)
-----   Message from Trula Slade, DPM sent at 10/12/2018  4:35 PM EDT ----- The CT scan is ordered for him for the ankle (which I asked for). Can you have them include as much of the foot as possible during this? Thanks.

## 2018-10-13 NOTE — Telephone Encounter (Signed)
Orders to J. Quintana, RN for pre-cert and faxed to [facility]. 

## 2018-10-15 NOTE — Telephone Encounter (Signed)
Left message informing Threasa Beards - [facility] Pre-cert Service Dr. Jacqualyn Posey had gotten Prior Authorization for the left ankle only and the pre-certed orders had been sent to the [facility].

## 2018-10-15 NOTE — Telephone Encounter (Signed)
Leon Nelson states she needs clarification of the CT of left ankle.

## 2018-10-15 NOTE — Telephone Encounter (Addendum)
Dr. Jacqualyn Posey states CT of left ankle approved PA# [id number], please include as much of the foot as possible. Faxed to [employer] [facility].

## 2018-10-17 ENCOUNTER — Ambulatory Visit (HOSPITAL_BASED_OUTPATIENT_CLINIC_OR_DEPARTMENT_OTHER): Payer: BLUE CROSS/BLUE SHIELD

## 2018-10-17 ENCOUNTER — Ambulatory Visit (HOSPITAL_BASED_OUTPATIENT_CLINIC_OR_DEPARTMENT_OTHER)
Admission: RE | Admit: 2018-10-17 | Discharge: 2018-10-17 | Disposition: A | Discharge status: Home / Self Care | Payer: BLUE CROSS/BLUE SHIELD | Source: Ambulatory Visit | Attending: Podiatry | Admitting: Podiatry

## 2018-10-17 DIAGNOSIS — M19072 Primary osteoarthritis, left ankle and foot: Secondary | ICD-10-CM | POA: Diagnosis not present

## 2018-10-17 DIAGNOSIS — M79672 Pain in left foot: Secondary | ICD-10-CM | POA: Diagnosis not present

## 2018-10-17 DIAGNOSIS — M25572 Pain in left ankle and joints of left foot: Secondary | ICD-10-CM

## 2018-10-17 DIAGNOSIS — G8929 Other chronic pain: Secondary | ICD-10-CM

## 2018-10-17 DIAGNOSIS — M7750 Other enthesopathy of unspecified foot: Secondary | ICD-10-CM

## 2018-10-17 DIAGNOSIS — M25571 Pain in right ankle and joints of right foot: Secondary | ICD-10-CM

## 2018-10-17 DIAGNOSIS — M19079 Primary osteoarthritis, unspecified ankle and foot: Secondary | ICD-10-CM

## 2018-10-19 ENCOUNTER — Other Ambulatory Visit: Payer: Self-pay | Admitting: Podiatry

## 2018-10-22 ENCOUNTER — Telehealth: Payer: Self-pay | Admitting: Podiatry

## 2018-10-22 NOTE — Telephone Encounter (Signed)
Left message infaorming pt I had spoken to Dr. Jacqualyn Posey, and he was going to review the results with other doctors in the practice and call with recommendations early next week.

## 2018-10-22 NOTE — Telephone Encounter (Signed)
Pt called wanting to know if his results were in from his X-rays done on 10/17/18. Could you give the patient a call please.

## 2018-10-22 NOTE — Telephone Encounter (Signed)
I informed pt I would tell Dr. Jacqualyn Posey the CT results were available and call again.

## 2018-10-27 ENCOUNTER — Other Ambulatory Visit (INDEPENDENT_AMBULATORY_CARE_PROVIDER_SITE_OTHER): Payer: BLUE CROSS/BLUE SHIELD

## 2018-10-27 DIAGNOSIS — E1149 Type 2 diabetes mellitus with other diabetic neurological complication: Secondary | ICD-10-CM | POA: Diagnosis not present

## 2018-10-27 DIAGNOSIS — Z0000 Encounter for general adult medical examination without abnormal findings: Secondary | ICD-10-CM

## 2018-10-27 LAB — URINALYSIS, ROUTINE W REFLEX MICROSCOPIC
BILIRUBIN URINE: NEGATIVE
Hgb urine dipstick: NEGATIVE
KETONES UR: NEGATIVE
LEUKOCYTES UA: NEGATIVE
NITRITE: NEGATIVE
PH: 6 (ref 5.0–8.0)
RBC / HPF: NONE SEEN (ref 0–?)
Specific Gravity, Urine: 1.025 (ref 1.000–1.030)
Total Protein, Urine: NEGATIVE
Urine Glucose: NEGATIVE
Urobilinogen, UA: 0.2 (ref 0.0–1.0)

## 2018-10-27 LAB — LIPID PANEL
CHOL/HDL RATIO: 4
Cholesterol: 125 mg/dL (ref 0–200)
HDL: 33.1 mg/dL — ABNORMAL LOW (ref >39.00)
LDL CALC: 65 mg/dL (ref 0–99)
NONHDL: 91.85
Triglycerides: 134 mg/dL (ref 0.0–149.0)
VLDL: 26.8 mg/dL (ref 0.0–40.0)

## 2018-10-27 LAB — BASIC METABOLIC PANEL
BUN: 21 mg/dL (ref 6–23)
CO2: 28 mEq/L (ref 19–32)
CREATININE: 0.77 mg/dL (ref 0.40–1.50)
Calcium: 8.9 mg/dL (ref 8.4–10.5)
Chloride: 104 mEq/L (ref 96–112)
GFR: 108.15 mL/min (ref >60.00)
GLUCOSE: 144 mg/dL — AB (ref 70–99)
POTASSIUM: 3.8 meq/L (ref 3.5–5.1)
Sodium: 140 mEq/L (ref 135–145)

## 2018-10-27 LAB — CBC WITH DIFFERENTIAL/PLATELET
Basophils Absolute: 0.1 10*3/uL (ref 0.0–0.1)
Basophils Relative: 0.6 % (ref 0.0–3.0)
EOS ABS: 0.4 10*3/uL (ref 0.0–0.7)
Eosinophils Relative: 5.1 % — ABNORMAL HIGH (ref 0.0–5.0)
HEMATOCRIT: 41.4 % (ref 39.0–52.0)
HEMOGLOBIN: 14.1 g/dL (ref 13.0–17.0)
Lymphocytes Relative: 32.9 % (ref 12.0–46.0)
Lymphs Abs: 2.9 10*3/uL (ref 0.7–4.0)
MCHC: 34.1 g/dL (ref 30.0–36.0)
MCV: 88.6 fl (ref 78.0–100.0)
Monocytes Absolute: 0.6 10*3/uL (ref 0.1–1.0)
Monocytes Relative: 7.4 % (ref 3.0–12.0)
NEUTROS ABS: 4.7 10*3/uL (ref 1.4–7.7)
Neutrophils Relative %: 54 % (ref 43.0–77.0)
PLATELETS: 217 10*3/uL (ref 150.0–400.0)
RBC: 4.67 Mil/uL (ref 4.22–5.81)
RDW: 13.7 % (ref 11.5–15.5)
WBC: 8.7 10*3/uL (ref 4.0–10.5)

## 2018-10-27 LAB — HEPATIC FUNCTION PANEL
ALK PHOS: 42 U/L (ref 39–117)
ALT: 28 U/L (ref 0–53)
AST: 23 U/L (ref 0–37)
Albumin: 3.9 g/dL (ref 3.5–5.2)
BILIRUBIN DIRECT: 0.1 mg/dL (ref 0.0–0.3)
TOTAL PROTEIN: 6.2 g/dL (ref 6.0–8.3)
Total Bilirubin: 0.4 mg/dL (ref 0.2–1.2)

## 2018-10-27 LAB — TSH: TSH: 2.63 u[IU]/mL (ref 0.35–4.50)

## 2018-10-27 LAB — PSA: PSA: 0.11 ng/mL (ref 0.10–4.00)

## 2018-10-27 LAB — HEMOGLOBIN A1C: HEMOGLOBIN A1C: 6.7 % — AB (ref 4.6–6.5)

## 2018-10-27 LAB — MICROALBUMIN / CREATININE URINE RATIO
Creatinine,U: 218.9 mg/dL
MICROALB UR: 1.4 mg/dL (ref 0.0–1.9)
Microalb Creat Ratio: 0.6 mg/g (ref 0.0–30.0)

## 2018-10-29 ENCOUNTER — Telehealth: Payer: Self-pay | Admitting: Podiatry

## 2018-10-29 ENCOUNTER — Ambulatory Visit (INDEPENDENT_AMBULATORY_CARE_PROVIDER_SITE_OTHER): Payer: BLUE CROSS/BLUE SHIELD | Admitting: Internal Medicine

## 2018-10-29 ENCOUNTER — Encounter: Payer: Self-pay | Admitting: Internal Medicine

## 2018-10-29 VITALS — BP 120/76 | HR 86 | Temp 98.6°F | Resp 16 | Ht 72.0 in | Wt 307.0 lb

## 2018-10-29 DIAGNOSIS — E1149 Type 2 diabetes mellitus with other diabetic neurological complication: Secondary | ICD-10-CM

## 2018-10-29 DIAGNOSIS — G8929 Other chronic pain: Secondary | ICD-10-CM

## 2018-10-29 DIAGNOSIS — M25572 Pain in left ankle and joints of left foot: Secondary | ICD-10-CM | POA: Diagnosis not present

## 2018-10-29 DIAGNOSIS — Z0000 Encounter for general adult medical examination without abnormal findings: Secondary | ICD-10-CM

## 2018-10-29 MED ORDER — DICLOFENAC SODIUM 1 % TD GEL: 2.0000 g | Freq: Four times a day (QID) | TRANSDERMAL | 5 refills | Status: AC | PRN

## 2018-10-29 NOTE — Telephone Encounter (Signed)
Pt called asking for his test results, expressed that he was supposed to get a phone call early this week with his results from Dr. Jacqualyn Posey. Pt states he can barely walk

## 2018-10-29 NOTE — Telephone Encounter (Signed)
I informed pt of Dr. Leigh Aurora statement.

## 2018-10-29 NOTE — Patient Instructions (Signed)
Please take all new medication as prescribed - the voltaren gel for ankle pain  You will be contacted regarding the referral for: Opthalmology  Please continue all other medications as before, and refills have been done if requested.  Please have the pharmacy call with any other refills you may need.  Please continue your efforts at being more active, low cholesterol diet, and weight control.  You are otherwise up to date with prevention measures today.  Please keep your appointments with your specialists as you may have planned  Please return in 6 months, or sooner if needed, with Lab testing done 3-5 days before

## 2018-10-29 NOTE — Telephone Encounter (Signed)
Let him know that are meeting tomorrow morning and then will call but please let him know that we will discuss tomorrow.

## 2018-10-29 NOTE — Telephone Encounter (Signed)
I called pt, states he was waiting for Dr. Leigh Aurora review of the CT results with the other podiatrists in office, and to schedule surgery. I told pt I would inform Dr. Jacqualyn Posey and call again.

## 2018-10-29 NOTE — Progress Notes (Signed)
Subjective:    Patient ID: Leon Nelson, male    DOB: [birth year], 62 y.o.   MRN: [id number]  HPI  Here for wellness and f/u;  Overall doing ok;  Pt denies Chest pain, worsening SOB, DOE, wheezing, orthopnea, PND, worsening LE edema, palpitations, dizziness or syncope.  Pt denies neurological change such as new headache, facial or extremity weakness.  Pt denies polydipsia, polyuria, or low sugar symptoms. Pt states overall good compliance with treatment and medications, good tolerability, and has been trying to follow appropriate diet.  Pt denies worsening depressive symptoms, suicidal ideation or panic. No fever, night sweats, wt loss, loss of appetite, or other constitutional symptoms.  Pt states good ability with ADL's, has low fall risk, home safety reviewed and adequate, no other significant changes in hearing or vision, and only occasionally active with exercise.  Has recently worsening left ankle pain now getting severe, can hardly walk at work, and walks 10 hrs per day at work, has seen ortho with CT ankle last wk, considering for surgury, ankle brace did not help.  Arch support did help some arch pain.  Due for ophthalmology f/u Past Medical History:  Diagnosis Date  . ABDOMINAL PAIN OTHER SPECIFIED SITE 07/20/2008  . CHEST PAIN 02/06/2011  . COLONIC POLYPS, HX OF 05/26/2008  . DEPRESSION 05/26/2008  . DIABETES MELLITUS, TYPE II 05/26/2008  . HYPERLIPIDEMIA 05/26/2008  . HYPERTENSION 05/26/2008  . HYPOTHYROIDISM 05/26/2008  . KNEE PAIN, RIGHT, ACUTE 07/01/2008  . Personal history of urinary calculi 05/26/2008  . UTI'S, HX OF 05/26/2008   Past Surgical History:  Procedure Laterality Date  . s/p right knee arthroplasty      complicated by patellar tendon rupture Jan 2011 Dr. Gladstone Lighter  . TONSILLECTOMY      reports that he has never smoked. He has never used smokeless tobacco. He reports that he does not drink alcohol or use drugs. family history includes Alcohol abuse in his other; Cancer in his  mother; Diabetes in his other; Heart disease in his father and mother; Hyperlipidemia in his father and mother. Allergies  Allergen Reactions  . Penicillins   . Sulfonamide Derivatives    Current Outpatient Medications on File Prior to Visit  Medication Sig Dispense Refill  . allopurinol (ZYLOPRIM) 300 MG tablet Take 1 tablet (300 mg total) by mouth daily. 90 tablet 3  . atenolol-chlorthalidone (TENORETIC) 50-25 MG tablet TAKE 1/2 TABLET BY MOUTH DAILY 45 tablet 2  . clindamycin (CLEOCIN) 150 MG capsule TAKE 4 CAPSULES BY MOUTH 1 HOUR PRIOR TO DENTAL TREATMENT  0  . clindamycin (CLEOCIN) 300 MG capsule Take 300 mg by mouth 3 (three) times daily.    . diazepam (VALIUM) 5 MG tablet 1 tab by mouth 45 min prior to procedure 1 tablet 0  . dicyclomine (BENTYL) 20 MG tablet Take 1 tablet (20 mg total) by mouth 3 (three) times daily as needed. 180 tablet 1  . gabapentin (NEURONTIN) 100 MG capsule Take 1 capsule (100 mg total) by mouth at bedtime. 60 capsule 0  . gabapentin (NEURONTIN) 300 MG capsule TAKE 1 CAPSULE BY MOUTH EVERYDAY AT BEDTIME 30 capsule 3  . glipiZIDE (GLUCOTROL XL) 10 MG 24 hr tablet TAKE 1 TABLET DAILY WITH BREAKFAST 90 tablet 3  . levothyroxine (SYNTHROID, LEVOTHROID) 50 MCG tablet TAKE 1 TABLET DAILY 90 tablet 2  . lisinopril (PRINIVIL,ZESTRIL) 20 MG tablet TAKE 1 TABLET DAILY (YEARLY PHYSICAL IS DUE, MUST SEE MD FOR REFILLS) 90 tablet 3  . metFORMIN (  GLUCOPHAGE-XR) 500 MG 24 hr tablet TAKE 3 TABLETS IN THE MORNING 270 tablet 3  . potassium chloride (K-DUR) 10 MEQ tablet Take 3 tablets (30 mEq total) by mouth daily. 270 tablet 3  . potassium chloride SA (K-DUR,KLOR-CON) 20 MEQ tablet TAKE 2 TABLETS DAILY 180 tablet 2  . predniSONE (DELTASONE) 10 MG tablet 3 tabs by mouth per day for 3 days,2tabs per day for 3 days,1tab per day for 3 days 18 tablet 0  . sildenafil (VIAGRA) 100 MG tablet Take 0.5-1 tablets (50-100 mg total) by mouth daily as needed. 10 tablet 0  . simvastatin  (ZOCOR) 80 MG tablet TAKE 1 TABLET AT BEDTIME 90 tablet 1  . traMADol (ULTRAM) 50 MG tablet Take 1 tablet (50 mg total) by mouth every 6 (six) hours as needed. 30 tablet 0  . venlafaxine XR (EFFEXOR-XR) 75 MG 24 hr capsule TAKE 1 CAPSULE DAILY 90 capsule 2   No current facility-administered medications on file prior to visit.    Review of Systems Constitutional: Negative for other unusual diaphoresis, sweats, appetite or weight changes HENT: Negative for other worsening hearing loss, ear pain, facial swelling, mouth sores or neck stiffness.   Eyes: Negative for other worsening pain, redness or other visual disturbance.  Respiratory: Negative for other stridor or swelling Cardiovascular: Negative for other palpitations or other chest pain  Gastrointestinal: Negative for worsening diarrhea or loose stools, blood in stool, distention or other pain Genitourinary: Negative for hematuria, flank pain or other change in urine volume.  Musculoskeletal: Negative for myalgias or other joint swelling.  Skin: Negative for other color change, or other wound or worsening drainage.  Neurological: Negative for other syncope or numbness. Hematological: Negative for other adenopathy or swelling Psychiatric/Behavioral: Negative for hallucinations, other worsening agitation, SI, self-injury, or new decreased concentration All other system neg per pt    Objective:   Physical Exam BP 120/76   Pulse 86   Temp 98.6 F (37 C) (Oral)   Resp 16   Ht 6' (1.829 m)   Wt (!) 307 lb (139.3 kg)   SpO2 97%   BMI 41.64 kg/m  VS noted,  Constitutional: Pt is oriented to person, place, and time. Appears well-developed and well-nourished, in no significant distress and comfortable Head: Normocephalic and atraumatic  Eyes: Conjunctivae and EOM are normal. Pupils are equal, round, and reactive to light Right Ear: External ear normal without discharge Left Ear: External ear normal without discharge Nose: Nose without  discharge or deformity Mouth/Throat: Oropharynx is without other ulcerations and moist  Neck: Normal range of motion. Neck supple. No JVD present. No tracheal deviation present or significant neck LA or mass Cardiovascular: Normal rate, regular rhythm, normal heart sounds and intact distal pulses.   Pulmonary/Chest: WOB normal and breath sounds without rales or wheezing  Abdominal: Soft. Bowel sounds are normal. NT. No HSM  Musculoskeletal: Normal range of motion. Exhibits no edema Lymphadenopathy: Has no other cervical adenopathy.  Neurological: Pt is alert and oriented to person, place, and time. Pt has normal reflexes. No cranial nerve deficit. Motor grossly intact, Gait intact Skin: Skin is warm and dry. No rash noted or new ulcerations Psychiatric:  Has normal mood and affect. Behavior is normal without agitation No other exam findings Lab Results  Component Value Date   WBC 8.7 10/27/2018   HGB 14.1 10/27/2018   HCT 41.4 10/27/2018   PLT 217.0 10/27/2018   GLUCOSE 144 (H) 10/27/2018   CHOL 125 10/27/2018   TRIG 134.0  10/27/2018   HDL 33.10 (L) 10/27/2018   LDLDIRECT 78.0 04/17/2018   LDLCALC 65 10/27/2018   ALT 28 10/27/2018   AST 23 10/27/2018   NA 140 10/27/2018   K 3.8 10/27/2018   CL 104 10/27/2018   CREATININE 0.77 10/27/2018   BUN 21 10/27/2018   CO2 28 10/27/2018   TSH 2.63 10/27/2018   PSA 0.11 10/27/2018   INR 2.0 ratio (H) 01/16/2010   HGBA1C 6.7 (H) 10/27/2018   MICROALBUR 1.4 10/27/2018       Assessment & Plan:

## 2018-10-31 ENCOUNTER — Telehealth: Payer: Self-pay | Admitting: Podiatry

## 2018-10-31 NOTE — Telephone Encounter (Signed)
Pt was returning Dr. Leigh Aurora phone call from 10/30/2018/ Please call patient back

## 2018-11-01 ENCOUNTER — Encounter: Payer: Self-pay | Admitting: Internal Medicine

## 2018-11-01 DIAGNOSIS — M25572 Pain in left ankle and joints of left foot: Secondary | ICD-10-CM

## 2018-11-01 DIAGNOSIS — G8929 Other chronic pain: Secondary | ICD-10-CM | POA: Insufficient documentation

## 2018-11-01 NOTE — Assessment & Plan Note (Signed)
stable overall by history and exam, recent data reviewed with pt, and pt to continue medical treatment as before,  to f/u any worsening symptoms or concerns  

## 2018-11-01 NOTE — Assessment & Plan Note (Signed)
[place] for volt gel prn,  to f/u any worsening symptoms or concerns

## 2018-11-01 NOTE — Assessment & Plan Note (Signed)

## 2018-11-02 ENCOUNTER — Telehealth: Payer: Self-pay | Admitting: Podiatry

## 2018-11-02 NOTE — Telephone Encounter (Signed)
I called the patient to discuss CT scan results.  After discussion was to proceed with surgery.  We discussed the triple arthrodesis.  We will check arterial studies prior to this and then after that we will see him back for surgical auscultation.  He would like to have the surgery done this year.  We will check with scheduling for this.  Trula Slade

## 2018-11-03 ENCOUNTER — Telehealth: Payer: Self-pay | Admitting: *Deleted

## 2018-11-03 DIAGNOSIS — M25571 Pain in right ankle and joints of right foot: Secondary | ICD-10-CM

## 2018-11-03 DIAGNOSIS — M19079 Primary osteoarthritis, unspecified ankle and foot: Secondary | ICD-10-CM

## 2018-11-03 DIAGNOSIS — M79672 Pain in left foot: Secondary | ICD-10-CM

## 2018-11-03 DIAGNOSIS — R0989 Other specified symptoms and signs involving the circulatory and respiratory systems: Secondary | ICD-10-CM

## 2018-11-03 DIAGNOSIS — M25572 Pain in left ankle and joints of left foot: Secondary | ICD-10-CM

## 2018-11-03 DIAGNOSIS — M7750 Other enthesopathy of unspecified foot: Secondary | ICD-10-CM

## 2018-11-03 DIAGNOSIS — G8929 Other chronic pain: Secondary | ICD-10-CM

## 2018-11-03 NOTE — Telephone Encounter (Signed)
-----   Message from Trula Slade, DPM sent at 11/02/2018 11:25 AM EST ----- Can you please order arterial duplex? This is for pre-operative exam given calcifications on CT scan. Thanks.

## 2018-11-03 NOTE — Telephone Encounter (Signed)
Faxed orders to [facility]. 

## 2018-11-10 ENCOUNTER — Other Ambulatory Visit: Payer: Self-pay | Admitting: Podiatry

## 2018-11-10 ENCOUNTER — Ambulatory Visit (HOSPITAL_COMMUNITY)
Admission: RE | Admit: 2018-11-10 | Discharge: 2018-11-10 | Disposition: A | Discharge status: Home / Self Care | Payer: BLUE CROSS/BLUE SHIELD | Source: Ambulatory Visit | Attending: Cardiology | Admitting: Cardiology

## 2018-11-10 DIAGNOSIS — R0989 Other specified symptoms and signs involving the circulatory and respiratory systems: Secondary | ICD-10-CM

## 2018-11-13 ENCOUNTER — Telehealth: Payer: Self-pay | Admitting: Podiatry

## 2018-11-13 ENCOUNTER — Telehealth: Payer: Self-pay | Admitting: *Deleted

## 2018-11-13 NOTE — Telephone Encounter (Signed)
Pt had ultrasound on Monday, calling following up to see if we have received the results yet. Please give patient a call.

## 2018-11-13 NOTE — Telephone Encounter (Signed)
-----   Message from Trula Slade, DPM sent at 11/13/2018  7:03 AM EST ----- Val- please let him know that his arterial study was normal and please have him come in for a surgery consult. Thanks.

## 2018-11-13 NOTE — Telephone Encounter (Signed)
I informed pt of Dr. Leigh Aurora review of results and orders. Transferred pt to schedulers.

## 2018-11-16 ENCOUNTER — Other Ambulatory Visit: Payer: Self-pay | Admitting: Internal Medicine

## 2018-11-27 ENCOUNTER — Encounter: Payer: Self-pay | Admitting: Podiatry

## 2018-11-27 ENCOUNTER — Ambulatory Visit: Payer: BLUE CROSS/BLUE SHIELD | Admitting: Podiatry

## 2018-11-27 DIAGNOSIS — Z01818 Encounter for other preprocedural examination: Secondary | ICD-10-CM

## 2018-11-27 DIAGNOSIS — M19079 Primary osteoarthritis, unspecified ankle and foot: Secondary | ICD-10-CM

## 2018-11-27 DIAGNOSIS — M25571 Pain in right ankle and joints of right foot: Secondary | ICD-10-CM | POA: Diagnosis not present

## 2018-11-27 DIAGNOSIS — M25572 Pain in left ankle and joints of left foot: Secondary | ICD-10-CM

## 2018-11-27 DIAGNOSIS — G8929 Other chronic pain: Secondary | ICD-10-CM | POA: Diagnosis not present

## 2018-11-27 NOTE — Patient Instructions (Signed)
Pre-Operative Instructions  Congratulations, you have decided to take an important step towards improving your quality of life.  You can be assured that the doctors and staff at [facility] will be with you every step of the way.  Here are some important things you should know:  1. Plan to be at the surgery center/hospital at least 1 (one) hour prior to your scheduled time, unless otherwise directed by the surgical center/hospital staff.  You must have a responsible adult accompany you, remain during the surgery and drive you home.  Make sure you have directions to the surgical center/hospital to ensure you arrive on time. 2. If you are having surgery at [facility] or [facility] hospitals, you will need a copy of your medical history and physical form from your family physician within one month prior to the date of surgery. We will give you a form for your primary physician to complete.  3. We make every effort to accommodate the date you request for surgery.  However, there are times where surgery dates or times have to be moved.  We will contact you as soon as possible if a change in schedule is required.   4. No aspirin/ibuprofen for one week before surgery.  If you are on aspirin, any non-steroidal anti-inflammatory medications (Mobic, Aleve, Ibuprofen) should not be taken seven (7) days prior to your surgery.  You make take Tylenol for pain prior to surgery.  5. Medications - If you are taking daily heart and blood pressure medications, seizure, reflux, allergy, asthma, anxiety, pain or diabetes medications, make sure you notify the surgery center/hospital before the day of surgery so they can tell you which medications you should take or avoid the day of surgery. 6. No food or drink after midnight the night before surgery unless directed otherwise by surgical center/hospital staff. 7. No alcoholic beverages 24-hours prior to surgery.  No smoking 24-hours prior or 24-hours after  surgery. 8. Wear loose pants or shorts. They should be loose enough to fit over bandages, boots, and casts. 9. Don't wear slip-on shoes. Sneakers are preferred. 10. Bring your boot with you to the surgery center/hospital.  Also bring crutches or a walker if your physician has prescribed it for you.  If you do not have this equipment, it will be provided for you after surgery. 11. If you have not been contacted by the surgery center/hospital by the day before your surgery, call to confirm the date and time of your surgery. 12. Leave-time from work may vary depending on the type of surgery you have.  Appropriate arrangements should be made prior to surgery with your employer. 13. Prescriptions will be provided immediately following surgery by your doctor.  Fill these as soon as possible after surgery and take the medication as directed. Pain medications will not be refilled on weekends and must be approved by the doctor. 14. Remove nail polish on the operative foot and avoid getting pedicures prior to surgery. 15. Wash the night before surgery.  The night before surgery wash the foot and leg well with water and the antibacterial soap provided. Be sure to pay special attention to beneath the toenails and in between the toes.  Wash for at least three (3) minutes. Rinse thoroughly with water and dry well with a towel.  Perform this wash unless told not to do so by your physician.  Enclosed: 1 Ice pack (please put in freezer the night before surgery)   1 Hibiclens skin cleaner     Pre-op instructions  If you have any questions regarding the instructions, please do not hesitate to call our office.  [place]: [address], [place], [place] [address] -- [phone]  [place]: [address] [place], [place] [address] -- [phone]  [place]: [address]  [place], [place] [address] -- [phone]  [place]: [address], [address], [place], [place] [address] -- [phone]  Website:  [email or web address] 

## 2018-12-01 DIAGNOSIS — M79674 Pain in right toe(s): Secondary | ICD-10-CM

## 2018-12-04 ENCOUNTER — Telehealth: Payer: Self-pay | Admitting: *Deleted

## 2018-12-04 ENCOUNTER — Telehealth: Payer: Self-pay | Admitting: Podiatry

## 2018-12-04 DIAGNOSIS — Z01818 Encounter for other preprocedural examination: Secondary | ICD-10-CM | POA: Diagnosis not present

## 2018-12-04 NOTE — Telephone Encounter (Signed)
Called pt and got him scheduled for surgery on 07 January 2019 per Dr. Leigh Aurora request as Dr. March Rummage is assisting. Pt asked me to let Marcie Bal know that his surgery has been scheduled so she can start his FMLA/STD paperwork. I told the pt the surgical center will call him 24-48 hours before surgery to let him know what time to arrive. Told him to call with any questions and/or concerns.

## 2018-12-04 NOTE — Telephone Encounter (Signed)
Per Dr Encarnacion Slates that patient needed to get his Vitamin D checked before surgery and I called and spoke with the patient to let him know that he can come by the office to get the blood work paper and then go and get it done and also stated that they will call patient with the date of surgery which will be 2nd week in January. Lattie Haw

## 2018-12-04 NOTE — Addendum Note (Signed)
Addended by: Cranford Mon R on: 12/04/2018 11:00 AM   Modules accepted: Orders

## 2018-12-04 NOTE — Progress Notes (Signed)
Subjective: 62 year old male presents the office today to discuss CT scan results as well as for surgical consultation.  He is continued to have quite a bit of pain to his left foot mostly on the medial aspect.  He states that is getting to the point where he can no longer walk and he is actually fallen because of the pain and he feels and steady on his feet.  We did intermittent numerous conservative treatments and without any significant improvement at this point he wants to consider surgical intervention. Denies any systemic complaints such as fevers, chills, nausea, vomiting. No acute changes since last appointment, and no other complaints at this time.   Objective: AAO x3, NAD DP/PT pulses palpable bilaterally, CRT less than 3 seconds Severe flatfoot deformities present.  There is tenderness mostly on the medial aspect of the foot on the talonavicular joint.  He also has discomfort on the sinus tarsi and course the posterior tibial tendon.  Minimal discomfort with ankle but there is no pain with ankle joint range of motion.  There is no area of pinpoint tenderness.  Calcaneal valgus is present.  No significant equinus No open lesions or pre-ulcerative lesions.  No pain with calf compression, swelling, warmth, erythema  Assessment: Severe flatfoot deformity with arthritic changes.  Plan: -All treatment options discussed with the patient including all alternatives, risks, complications.  -I viewed the CT scan with him.  Had a long discussion regards to multiple treatment options both conservative and surgical.  Is attempted conservative treatment and significant provement and given that his foot is limiting his quality of life he wants to proceed with surgery knowing this is not a guarantee.  I discussed with him a triple arthrodesis.  Discussed with him he may need to have a surgery for his ankle in the future given the mild arthritis noted on CT scan.  Hopefully we can reposition the foot and get  better correction this will be helpful for him.  At this point he wishes to proceed with surgery. -We will plan for triple arthrodesis left foot -The incision placement as well as the postoperative course was discussed with the patient. I discussed risks of the surgery which include, but not limited to, infection, bleeding, pain, swelling, need for further surgery, delayed or nonhealing, painful or ugly scar, numbness or sensation changes, over/under correction, recurrence, transfer lesions, further deformity, hardware failure, DVT/PE, loss of toe/foot, worsening ankle pain. Patient understands these risks and wishes to proceed with surgery. The surgical consent was reviewed with the patient all 3 pages were signed. No promises or guarantees were given to the outcome of the procedure. All questions were answered to the best of my ability. Before the surgery the patient was encouraged to call the office if there is any further questions. The surgery will be [facility] at the [facility] on an outpatient basis. -Physical therapy prescription written today to do preoperative evaluation with physical therapy. -We will attempt home health care set up postoperatively. -Lovenox 2 weeks postop -We will check vitamin D level. -Patient encouraged to call the office with any questions, concerns, change in symptoms.   Trula Slade DPM

## 2018-12-05 ENCOUNTER — Other Ambulatory Visit: Payer: Self-pay | Admitting: Internal Medicine

## 2018-12-05 ENCOUNTER — Other Ambulatory Visit: Payer: Self-pay | Admitting: Podiatry

## 2018-12-05 LAB — VITAMIN D 25 HYDROXY (VIT D DEFICIENCY, FRACTURES): Vit D, 25-Hydroxy: 12 ng/mL — ABNORMAL LOW (ref 30–100)

## 2018-12-05 MED ORDER — VITAMIN D (ERGOCALCIFEROL) 1.25 MG (50000 UNIT) PO CAPS: 50000.0000 [IU] | ORAL_CAPSULE | ORAL | 0 refills | Status: DC

## 2018-12-05 NOTE — Progress Notes (Signed)
Called and spoke with the patient stating that the Vitamin D blood work came back low and that patient needed to go to the pharmacy and pick up prescription and take as directed and to call the office if any concerns or questions. Leon Nelson

## 2018-12-12 DIAGNOSIS — M25672 Stiffness of left ankle, not elsewhere classified: Secondary | ICD-10-CM | POA: Diagnosis not present

## 2018-12-12 DIAGNOSIS — M62571 Muscle wasting and atrophy, not elsewhere classified, right ankle and foot: Secondary | ICD-10-CM | POA: Diagnosis not present

## 2018-12-12 DIAGNOSIS — M25571 Pain in right ankle and joints of right foot: Secondary | ICD-10-CM | POA: Diagnosis not present

## 2018-12-12 DIAGNOSIS — M25671 Stiffness of right ankle, not elsewhere classified: Secondary | ICD-10-CM | POA: Diagnosis not present

## 2018-12-20 ENCOUNTER — Other Ambulatory Visit: Payer: Self-pay | Admitting: Internal Medicine

## 2018-12-23 DIAGNOSIS — E119 Type 2 diabetes mellitus without complications: Secondary | ICD-10-CM | POA: Diagnosis not present

## 2018-12-25 ENCOUNTER — Other Ambulatory Visit: Payer: Self-pay | Admitting: Internal Medicine

## 2018-12-25 ENCOUNTER — Telehealth: Payer: Self-pay | Admitting: *Deleted

## 2018-12-25 DIAGNOSIS — M25571 Pain in right ankle and joints of right foot: Secondary | ICD-10-CM | POA: Diagnosis not present

## 2018-12-25 DIAGNOSIS — M25671 Stiffness of right ankle, not elsewhere classified: Secondary | ICD-10-CM | POA: Diagnosis not present

## 2018-12-25 DIAGNOSIS — M25672 Stiffness of left ankle, not elsewhere classified: Secondary | ICD-10-CM | POA: Diagnosis not present

## 2018-12-25 DIAGNOSIS — M62571 Muscle wasting and atrophy, not elsewhere classified, right ankle and foot: Secondary | ICD-10-CM | POA: Diagnosis not present

## 2018-12-25 NOTE — Telephone Encounter (Signed)
Called and spoke with patient on 12/23/18 and stating that the [facility] was asking for a refill of the gabapentin Capsules 300 mg and patient stated that he did not need any refills due to having two bottles already and I stated that I would not send in the RX to [facility] and to call the office at [phone]. Leon Nelson

## 2018-12-26 DIAGNOSIS — M62571 Muscle wasting and atrophy, not elsewhere classified, right ankle and foot: Secondary | ICD-10-CM | POA: Diagnosis not present

## 2018-12-26 DIAGNOSIS — M25571 Pain in right ankle and joints of right foot: Secondary | ICD-10-CM | POA: Diagnosis not present

## 2018-12-26 DIAGNOSIS — M25671 Stiffness of right ankle, not elsewhere classified: Secondary | ICD-10-CM | POA: Diagnosis not present

## 2018-12-26 DIAGNOSIS — M25672 Stiffness of left ankle, not elsewhere classified: Secondary | ICD-10-CM | POA: Diagnosis not present

## 2018-12-30 DIAGNOSIS — M62571 Muscle wasting and atrophy, not elsewhere classified, right ankle and foot: Secondary | ICD-10-CM | POA: Diagnosis not present

## 2018-12-30 DIAGNOSIS — M25571 Pain in right ankle and joints of right foot: Secondary | ICD-10-CM | POA: Diagnosis not present

## 2018-12-30 DIAGNOSIS — M25672 Stiffness of left ankle, not elsewhere classified: Secondary | ICD-10-CM | POA: Diagnosis not present

## 2018-12-30 DIAGNOSIS — M25671 Stiffness of right ankle, not elsewhere classified: Secondary | ICD-10-CM | POA: Diagnosis not present

## 2019-01-01 ENCOUNTER — Other Ambulatory Visit: Payer: Self-pay | Admitting: Internal Medicine

## 2019-01-01 MED ORDER — LEVOTHYROXINE SODIUM 50 MCG PO TABS: 50.0000 ug | ORAL_TABLET | Freq: Every day | ORAL | 0 refills | Status: DC

## 2019-01-01 NOTE — Telephone Encounter (Signed)
Copied from [place] [id number]. Topic: Quick Communication - Rx Refill/Question >> Jan 01, 2019 11:09 AM Scherrie Gerlach wrote: Medication: levothyroxine (SYNTHROID, LEVOTHROID) 50 MCG tablet  Pt states his Rx mail order has been lost, and the company is tracking it, but pt has been without his med for 6 days. Pt wants to know if you can send in 7 tabs to local [id number] [address], [place] (Phone) [phone] (Fax)

## 2019-01-01 NOTE — Telephone Encounter (Signed)
Pt stated mail order has been lost and the company is tracking it pt has been without his meds for 7 tabs Requested Prescriptions  Pending Prescriptions Disp Refills  . levothyroxine (SYNTHROID, LEVOTHROID) 50 MCG tablet 7 tablet 0    Sig: Take 1 tablet (50 mcg total) by mouth daily.     Endocrinology:  Hypothyroid Agents Failed - 01/01/2019 11:13 AM      Failed - TSH needs to be rechecked within 3 months after an abnormal result. Refill until TSH is due.      Passed - TSH in normal range and within 360 days    TSH  Date Value Ref Range Status  10/27/2018 2.63 0.35 - 4.50 uIU/mL Final         Passed - Valid encounter within last 12 months    Recent Outpatient Visits          2 months ago Preventative health care   [facility] -Georges Mouse, MD   8 months ago Acute gouty arthritis   [facility], James W, MD   11 months ago Left cervical radiculopathy   [facility] -Georges Mouse, MD   1 year ago Preventative health care   [facility] -Georges Mouse, MD   1 year ago Hyperlipidemia, unspecified hyperlipidemia type   [facility] -Georges Mouse, MD      Future Appointments            In 3 months Jenny Reichmann, Hunt Oris, MD [place], [facility]

## 2019-01-02 DIAGNOSIS — M25571 Pain in right ankle and joints of right foot: Secondary | ICD-10-CM | POA: Diagnosis not present

## 2019-01-02 DIAGNOSIS — M25671 Stiffness of right ankle, not elsewhere classified: Secondary | ICD-10-CM | POA: Diagnosis not present

## 2019-01-02 DIAGNOSIS — M25672 Stiffness of left ankle, not elsewhere classified: Secondary | ICD-10-CM | POA: Diagnosis not present

## 2019-01-02 DIAGNOSIS — M62571 Muscle wasting and atrophy, not elsewhere classified, right ankle and foot: Secondary | ICD-10-CM | POA: Diagnosis not present

## 2019-01-06 ENCOUNTER — Telehealth: Payer: Self-pay | Admitting: *Deleted

## 2019-01-06 ENCOUNTER — Other Ambulatory Visit: Payer: Self-pay | Admitting: Podiatry

## 2019-01-06 ENCOUNTER — Other Ambulatory Visit: Payer: Self-pay | Admitting: *Deleted

## 2019-01-06 DIAGNOSIS — M25571 Pain in right ankle and joints of right foot: Secondary | ICD-10-CM | POA: Diagnosis not present

## 2019-01-06 DIAGNOSIS — M25672 Stiffness of left ankle, not elsewhere classified: Secondary | ICD-10-CM | POA: Diagnosis not present

## 2019-01-06 DIAGNOSIS — M19072 Primary osteoarthritis, left ankle and foot: Secondary | ICD-10-CM

## 2019-01-06 DIAGNOSIS — M25671 Stiffness of right ankle, not elsewhere classified: Secondary | ICD-10-CM | POA: Diagnosis not present

## 2019-01-06 DIAGNOSIS — M62571 Muscle wasting and atrophy, not elsewhere classified, right ankle and foot: Secondary | ICD-10-CM | POA: Diagnosis not present

## 2019-01-06 MED ORDER — CLINDAMYCIN HCL 300 MG PO CAPS: 300.0000 mg | ORAL_CAPSULE | Freq: Three times a day (TID) | ORAL | 0 refills | Status: DC

## 2019-01-06 MED ORDER — PROMETHAZINE HCL 25 MG PO TABS: 25.0000 mg | ORAL_TABLET | Freq: Three times a day (TID) | ORAL | 0 refills | Status: DC | PRN

## 2019-01-06 MED ORDER — ENOXAPARIN SODIUM 40 MG/0.4ML ~~LOC~~ SOLN: 40.0000 mg | SUBCUTANEOUS | 0 refills | Status: DC

## 2019-01-06 MED ORDER — OXYCODONE-ACETAMINOPHEN 10-325 MG PO TABS: 1.0000 | ORAL_TABLET | ORAL | 0 refills | Status: DC | PRN

## 2019-01-06 NOTE — Telephone Encounter (Signed)
Called and spoke with the patient and stated per Dr Jacqualyn Posey to be sure and bring the post op medicine from the pharmacy to the surgery with you tomorrow. Lattie Haw

## 2019-01-06 NOTE — Progress Notes (Signed)
Post-op medications sent to the pharmacy.  

## 2019-01-07 ENCOUNTER — Encounter: Payer: Self-pay | Admitting: Podiatry

## 2019-01-07 ENCOUNTER — Other Ambulatory Visit: Payer: Self-pay | Admitting: Podiatry

## 2019-01-07 ENCOUNTER — Inpatient Hospital Stay (HOSPITAL_COMMUNITY)
Admission: EM | Admit: 2019-01-07 | Discharge: 2019-01-15 | DRG: 558 | Disposition: A | Discharge status: Skilled Nursing Facility | Payer: BLUE CROSS/BLUE SHIELD | Attending: Internal Medicine | Admitting: Internal Medicine

## 2019-01-07 ENCOUNTER — Telehealth: Payer: Self-pay | Admitting: *Deleted

## 2019-01-07 ENCOUNTER — Emergency Department (HOSPITAL_COMMUNITY): Payer: BLUE CROSS/BLUE SHIELD

## 2019-01-07 ENCOUNTER — Encounter (HOSPITAL_COMMUNITY): Payer: Self-pay | Admitting: Emergency Medicine

## 2019-01-07 ENCOUNTER — Other Ambulatory Visit: Payer: Self-pay

## 2019-01-07 DIAGNOSIS — Z96651 Presence of right artificial knee joint: Secondary | ICD-10-CM | POA: Diagnosis present

## 2019-01-07 DIAGNOSIS — E1165 Type 2 diabetes mellitus with hyperglycemia: Secondary | ICD-10-CM | POA: Diagnosis present

## 2019-01-07 DIAGNOSIS — Z79899 Other long term (current) drug therapy: Secondary | ICD-10-CM

## 2019-01-07 DIAGNOSIS — F329 Major depressive disorder, single episode, unspecified: Secondary | ICD-10-CM | POA: Diagnosis not present

## 2019-01-07 DIAGNOSIS — M109 Gout, unspecified: Secondary | ICD-10-CM | POA: Diagnosis not present

## 2019-01-07 DIAGNOSIS — Z8249 Family history of ischemic heart disease and other diseases of the circulatory system: Secondary | ICD-10-CM | POA: Diagnosis not present

## 2019-01-07 DIAGNOSIS — M6282 Rhabdomyolysis: Secondary | ICD-10-CM | POA: Diagnosis not present

## 2019-01-07 DIAGNOSIS — M2142 Flat foot [pes planus] (acquired), left foot: Secondary | ICD-10-CM | POA: Diagnosis not present

## 2019-01-07 DIAGNOSIS — E039 Hypothyroidism, unspecified: Secondary | ICD-10-CM | POA: Diagnosis present

## 2019-01-07 DIAGNOSIS — Z833 Family history of diabetes mellitus: Secondary | ICD-10-CM

## 2019-01-07 DIAGNOSIS — R05 Cough: Secondary | ICD-10-CM | POA: Diagnosis not present

## 2019-01-07 DIAGNOSIS — Z79891 Long term (current) use of opiate analgesic: Secondary | ICD-10-CM

## 2019-01-07 DIAGNOSIS — M19272 Secondary osteoarthritis, left ankle and foot: Secondary | ICD-10-CM | POA: Diagnosis not present

## 2019-01-07 DIAGNOSIS — Z86010 Personal history of colonic polyps: Secondary | ICD-10-CM

## 2019-01-07 DIAGNOSIS — X58XXXA Exposure to other specified factors, initial encounter: Secondary | ICD-10-CM | POA: Diagnosis present

## 2019-01-07 DIAGNOSIS — Z87442 Personal history of urinary calculi: Secondary | ICD-10-CM

## 2019-01-07 DIAGNOSIS — M25551 Pain in right hip: Secondary | ICD-10-CM | POA: Diagnosis not present

## 2019-01-07 DIAGNOSIS — Z803 Family history of malignant neoplasm of breast: Secondary | ICD-10-CM

## 2019-01-07 DIAGNOSIS — M19072 Primary osteoarthritis, left ankle and foot: Secondary | ICD-10-CM | POA: Diagnosis not present

## 2019-01-07 DIAGNOSIS — Z880 Allergy status to penicillin: Secondary | ICD-10-CM | POA: Diagnosis not present

## 2019-01-07 DIAGNOSIS — Z7984 Long term (current) use of oral hypoglycemic drugs: Secondary | ICD-10-CM

## 2019-01-07 DIAGNOSIS — E785 Hyperlipidemia, unspecified: Secondary | ICD-10-CM | POA: Diagnosis present

## 2019-01-07 DIAGNOSIS — Z981 Arthrodesis status: Secondary | ICD-10-CM

## 2019-01-07 DIAGNOSIS — R740 Nonspecific elevation of levels of transaminase and lactic acid dehydrogenase [LDH]: Secondary | ICD-10-CM | POA: Diagnosis present

## 2019-01-07 DIAGNOSIS — Z8349 Family history of other endocrine, nutritional and metabolic diseases: Secondary | ICD-10-CM

## 2019-01-07 DIAGNOSIS — R7401 Elevation of levels of liver transaminase levels: Secondary | ICD-10-CM | POA: Diagnosis present

## 2019-01-07 DIAGNOSIS — M25572 Pain in left ankle and joints of left foot: Secondary | ICD-10-CM | POA: Diagnosis not present

## 2019-01-07 DIAGNOSIS — Z882 Allergy status to sulfonamides status: Secondary | ICD-10-CM | POA: Diagnosis not present

## 2019-01-07 DIAGNOSIS — Z6841 Body Mass Index (BMI) 40.0 and over, adult: Secondary | ICD-10-CM

## 2019-01-07 DIAGNOSIS — Z79890 Hormone replacement therapy (postmenopausal): Secondary | ICD-10-CM

## 2019-01-07 DIAGNOSIS — E7800 Pure hypercholesterolemia, unspecified: Secondary | ICD-10-CM | POA: Diagnosis not present

## 2019-01-07 DIAGNOSIS — R6510 Systemic inflammatory response syndrome (SIRS) of non-infectious origin without acute organ dysfunction: Secondary | ICD-10-CM | POA: Diagnosis present

## 2019-01-07 DIAGNOSIS — R279 Unspecified lack of coordination: Secondary | ICD-10-CM | POA: Diagnosis not present

## 2019-01-07 DIAGNOSIS — Z743 Need for continuous supervision: Secondary | ICD-10-CM | POA: Diagnosis not present

## 2019-01-07 DIAGNOSIS — K589 Irritable bowel syndrome without diarrhea: Secondary | ICD-10-CM | POA: Diagnosis not present

## 2019-01-07 DIAGNOSIS — R9431 Abnormal electrocardiogram [ECG] [EKG]: Secondary | ICD-10-CM | POA: Diagnosis not present

## 2019-01-07 DIAGNOSIS — E1140 Type 2 diabetes mellitus with diabetic neuropathy, unspecified: Secondary | ICD-10-CM | POA: Diagnosis not present

## 2019-01-07 DIAGNOSIS — T796XXA Traumatic ischemia of muscle, initial encounter: Secondary | ICD-10-CM | POA: Diagnosis not present

## 2019-01-07 DIAGNOSIS — I10 Essential (primary) hypertension: Secondary | ICD-10-CM | POA: Diagnosis not present

## 2019-01-07 DIAGNOSIS — E1149 Type 2 diabetes mellitus with other diabetic neurological complication: Secondary | ICD-10-CM | POA: Diagnosis not present

## 2019-01-07 DIAGNOSIS — R531 Weakness: Secondary | ICD-10-CM | POA: Diagnosis not present

## 2019-01-07 DIAGNOSIS — R0602 Shortness of breath: Secondary | ICD-10-CM | POA: Diagnosis not present

## 2019-01-07 DIAGNOSIS — S90822A Blister (nonthermal), left foot, initial encounter: Secondary | ICD-10-CM | POA: Diagnosis present

## 2019-01-07 DIAGNOSIS — Z87440 Personal history of urinary (tract) infections: Secondary | ICD-10-CM | POA: Diagnosis not present

## 2019-01-07 DIAGNOSIS — Q6652 Congenital pes planus, left foot: Secondary | ICD-10-CM | POA: Diagnosis not present

## 2019-01-07 DIAGNOSIS — R52 Pain, unspecified: Secondary | ICD-10-CM | POA: Diagnosis not present

## 2019-01-07 DIAGNOSIS — E559 Vitamin D deficiency, unspecified: Secondary | ICD-10-CM

## 2019-01-07 LAB — CBC WITH DIFFERENTIAL/PLATELET
Abs Immature Granulocytes: 0.05 10*3/uL (ref 0.00–0.07)
Basophils Absolute: 0 10*3/uL (ref 0.0–0.1)
Basophils Relative: 0 %
Eosinophils Absolute: 0 10*3/uL (ref 0.0–0.5)
Eosinophils Relative: 0 %
HCT: 40.6 % (ref 39.0–52.0)
Hemoglobin: 13.3 g/dL (ref 13.0–17.0)
Immature Granulocytes: 0 %
Lymphocytes Relative: 13 %
Lymphs Abs: 1.9 10*3/uL (ref 0.7–4.0)
MCH: 28.9 pg (ref 26.0–34.0)
MCHC: 32.8 g/dL (ref 30.0–36.0)
MCV: 88.1 fL (ref 80.0–100.0)
MONO ABS: 1.4 10*3/uL — AB (ref 0.1–1.0)
Monocytes Relative: 10 %
Neutro Abs: 10.6 10*3/uL — ABNORMAL HIGH (ref 1.7–7.7)
Neutrophils Relative %: 77 %
Platelets: 226 10*3/uL (ref 150–400)
RBC: 4.61 MIL/uL (ref 4.22–5.81)
RDW: 12.7 % (ref 11.5–15.5)
WBC: 13.9 10*3/uL — ABNORMAL HIGH (ref 4.0–10.5)
nRBC: 0 % (ref 0.0–0.2)

## 2019-01-07 MED ORDER — GABAPENTIN 600 MG PO TABS
300.0000 mg | ORAL_TABLET | Freq: Once | ORAL | Status: AC
  Administered 2019-01-07: 300 mg via ORAL
  Filled 2019-01-07: qty 1
  Filled 2019-01-07: qty 0.5

## 2019-01-07 MED ORDER — DIAZEPAM 5 MG PO TABS
5.0000 mg | ORAL_TABLET | Freq: Once | ORAL | Status: AC
  Administered 2019-01-07: 5 mg via ORAL
  Filled 2019-01-07 (×2): qty 1

## 2019-01-07 MED ORDER — DIAZEPAM 5 MG PO TABS: 5.0000 mg | ORAL_TABLET | Freq: Two times a day (BID) | ORAL | 0 refills | Status: DC | PRN

## 2019-01-07 NOTE — Progress Notes (Signed)
Patients wife called. Sue started to have worsening pain in his right buttock area again and it is worse. He was discharged from the surgery center as it was significantly improved but on the way home it is getting worse. Patient is on the way to the ER. I called the ER charge nurse to let them know he is coming.   Based on my exam I think that it is more muscular in nature as he just underwent a 4 hour surgery on his foot and positioning may have caused this. However would order a venous duplex to rule out blood clot (as they are reporting pain in the back of the leg). When I saw him the leg was supple. There was no ecchymosis or hematoma or skin breakdown.   If pain is not controlled consider overnight observation. Unfortunatly I do not have admitting privileges and would need to be admitted to the hospitalist service and I will be happy to see him as a consult.   Celesta Gentile, DPM O: [phone] C: [phone]

## 2019-01-07 NOTE — Progress Notes (Signed)
Order to recheck vitamin D level placed.

## 2019-01-07 NOTE — ED Provider Notes (Signed)
[facility] EMERGENCY DEPARTMENT Provider Note   CSN: [id number] Arrival date & time: 01/07/19  1826     History   Chief Complaint Chief Complaint  Patient presents with  . Rectal Pain    HPI Leon Nelson is a 63 y.o. male who presents with R hip pain. PMH significant for obesity, DM, HTN, HLD, hypothyroid, chronic bilateral ankle pain. He states that he had surgery at the outpatient center by podiatry this morning (Dr. Albesa Seen). The surgery was ~4 hours. After he got up from surgery he started to have severe pain in his R buttock and numbness of his right thigh. They thought it was most likely musculoskeletal. He was given morphine, oxycodone, and valium which reduced his pain to 4/10 and he was discharged home. He was advised to come to the ED if worsening. He needed help to get in the car. His wife notes that they didn't even make it home before the pain started returning so they came to the ED. He denies weakness of the R leg. The pain does not radiate. No numbness/tingling of the lower leg. He denies left leg pain and states he had a nerve block of the left ankle. He was noted to be febrile in triage. He reports URI symptoms of runny nose and cough productive of white sputum. He denies any chest pain, SOB, abdominal pain, N/V. He also notes he hasn't urinate or had a BM since this morning and he's been drinking a lot of water.  HPI  Past Medical History:  Diagnosis Date  . ABDOMINAL PAIN OTHER SPECIFIED SITE 07/20/2008  . CHEST PAIN 02/06/2011  . COLONIC POLYPS, HX OF 05/26/2008  . DEPRESSION 05/26/2008  . DIABETES MELLITUS, TYPE II 05/26/2008  . HYPERLIPIDEMIA 05/26/2008  . HYPERTENSION 05/26/2008  . HYPOTHYROIDISM 05/26/2008  . KNEE PAIN, RIGHT, ACUTE 07/01/2008  . Personal history of urinary calculi 05/26/2008  . UTI'S, HX OF 05/26/2008    Patient Active Problem List   Diagnosis Date Noted  . Chronic pain of left ankle 11/01/2018  . Acute gouty arthritis 04/17/2018    . Left cervical radiculopathy 01/27/2018  . Bilateral shoulder pain 01/27/2018  . Left hand paresthesia 01/27/2018  . Hypokalemia 03/27/2017  . Posterior neck pain 09/13/2015  . Heart murmur previously undiagnosed 08/14/2011  . Preventative health care 08/12/2011  . CHEST PAIN 02/06/2011  . ABDOMINAL PAIN OTHER SPECIFIED SITE 07/20/2008  . KNEE PAIN, RIGHT, ACUTE 07/01/2008  . Hypothyroidism 05/26/2008  . Diabetes mellitus type 2 with neurological manifestations ([place]) 05/26/2008  . Hyperlipidemia 05/26/2008  . Depression 05/26/2008  . Essential hypertension 05/26/2008  . History of colonic polyps 05/26/2008  . UTI'S, HX OF 05/26/2008  . Personal history of urinary calculi 05/26/2008    Past Surgical History:  Procedure Laterality Date  . s/p right knee arthroplasty      complicated by patellar tendon rupture Jan 2011 Dr. Gladstone Lighter  . TONSILLECTOMY          Home Medications    Prior to Admission medications   Medication Sig Start Date End Date Taking? Authorizing Provider  allopurinol (ZYLOPRIM) 300 MG tablet TAKE 1 TABLET DAILY 12/25/18   Biagio Borg, MD  atenolol-chlorthalidone (TENORETIC) 50-25 MG tablet TAKE ONE-HALF (1/2) TABLET DAILY 12/25/18   Biagio Borg, MD  clindamycin (CLEOCIN) 300 MG capsule Take 1 capsule (300 mg total) by mouth 3 (three) times daily. 01/06/19   Trula Slade, DPM  diazepam (VALIUM) 5 MG tablet Take  1 tablet (5 mg total) by mouth every 12 (twelve) hours as needed for muscle spasms. 01/07/19   Trula Slade, DPM  diclofenac sodium (VOLTAREN) 1 % GEL Apply 2 g topically 4 (four) times daily as needed. 10/29/18   Biagio Borg, MD  dicyclomine (BENTYL) 20 MG tablet Take 1 tablet (20 mg total) by mouth 3 (three) times daily as needed. 10/11/17   Biagio Borg, MD  dicyclomine (BENTYL) 20 MG tablet TAKE 1 TABLET BY MOUTH THREE TIMES DAILY AS NEEDED 11/17/18   Biagio Borg, MD  enoxaparin (LOVENOX) 40 MG/0.4ML injection Inject 0.4 mLs (40 mg  total) into the skin daily. 01/06/19   Trula Slade, DPM  gabapentin (NEURONTIN) 300 MG capsule TAKE 1 CAPSULE BY MOUTH EVERYDAY AT BEDTIME 10/20/18   Trula Slade, DPM  glipiZIDE (GLUCOTROL XL) 10 MG 24 hr tablet TAKE 1 TABLET DAILY WITH BREAKFAST 09/24/18   Biagio Borg, MD  levothyroxine (SYNTHROID, LEVOTHROID) 50 MCG tablet Take 1 tablet (50 mcg total) by mouth daily. 01/01/19   Biagio Borg, MD  lisinopril (PRINIVIL,ZESTRIL) 20 MG tablet Take 1 tablet (20 mg total) by mouth daily. 12/06/18   Biagio Borg, MD  metFORMIN (GLUCOPHAGE-XR) 500 MG 24 hr tablet TAKE 3 TABLETS IN THE MORNING 12/25/18   Biagio Borg, MD  oxyCODONE-acetaminophen (PERCOCET) 10-325 MG tablet Take 1 tablet by mouth every 4 (four) hours as needed for pain. MAXIMUM TOTAL ACETAMINOPHEN DOSE IS 4000 MG PER DAY 01/06/19   Trula Slade, DPM  potassium chloride SA (K-DUR,KLOR-CON) 20 MEQ tablet TAKE 2 TABLETS DAILY 12/25/18   Biagio Borg, MD  promethazine (PHENERGAN) 25 MG tablet Take 1 tablet (25 mg total) by mouth every 8 (eight) hours as needed for nausea or vomiting. 01/06/19   Trula Slade, DPM  sildenafil (VIAGRA) 100 MG tablet Take 0.5-1 tablets (50-100 mg total) by mouth daily as needed. 09/25/16   Biagio Borg, MD  simvastatin (ZOCOR) 80 MG tablet TAKE 1 TABLET AT BEDTIME 09/29/18   Biagio Borg, MD  venlafaxine XR (EFFEXOR-XR) 75 MG 24 hr capsule TAKE 1 CAPSULE DAILY 09/29/18   Biagio Borg, MD  Vitamin D, Ergocalciferol, (DRISDOL) 1.25 MG (50000 UT) CAPS capsule Take 1 capsule (50,000 Units total) by mouth every 7 (seven) days. 12/05/18   Trula Slade, DPM    Family History Family History  Problem Relation Age of Onset  . Cancer Mother        breast cancer  . Hyperlipidemia Mother   . Heart disease Mother   . Hyperlipidemia Father   . Heart disease Father   . Alcohol abuse Other   . Diabetes Other     Social History Social History   Tobacco Use  . Smoking status: Never Smoker  .  Smokeless tobacco: Never Used  Substance Use Topics  . Alcohol use: No  . Drug use: No     Allergies   Penicillins and Sulfonamide derivatives   Review of Systems Review of Systems  Constitutional: Negative for chills and fever.  HENT: Positive for rhinorrhea.   Respiratory: Positive for cough. Negative for shortness of breath.   Cardiovascular: Negative for chest pain.  Gastrointestinal: Negative for abdominal pain, nausea and vomiting.  Genitourinary: Positive for difficulty urinating. Negative for dysuria, flank pain, frequency and hematuria.  Musculoskeletal: Positive for arthralgias, gait problem and myalgias. Negative for back pain and joint swelling.  Neurological: Negative for syncope and headaches.  All other systems reviewed and are negative.    Physical Exam Updated Vital Signs BP 117/86 (BP Location: Right Arm)   Pulse 85   Temp 98.1 F (36.7 C) (Oral)   Resp 16   SpO2 94%   Physical Exam Vitals signs and nursing note reviewed.  Constitutional:      General: He is not in acute distress.    Appearance: He is well-developed. He is obese. He is not ill-appearing.     Comments: Calm, cooperative. Appears uncomfortable due to pain  HENT:     Head: Normocephalic and atraumatic.  Eyes:     General: No scleral icterus.       Right eye: No discharge.        Left eye: No discharge.     Conjunctiva/sclera: Conjunctivae normal.     Pupils: Pupils are equal, round, and reactive to light.  Neck:     Musculoskeletal: Normal range of motion.  Cardiovascular:     Rate and Rhythm: Normal rate and regular rhythm.  Pulmonary:     Effort: Pulmonary effort is normal. No respiratory distress.     Breath sounds: Wheezing (mild expiratory wheezes) present.  Abdominal:     General: There is no distension.     Tenderness: There is no abdominal tenderness.  Musculoskeletal:     Comments: Left lower extremity: Splint and bandage applied to lower leg  Right lower extremity:  Significant tenderness of buttocks. No tenderness of thigh, knee, calf. Subjective decreased sensation over the anterior thigh. 5/5 hip strength. 2+ DP pulse  No back tenderness  Skin:    General: Skin is warm and dry.  Neurological:     Mental Status: He is alert and oriented to person, place, and time.  Psychiatric:        Behavior: Behavior normal.      ED Treatments / Results  Labs (all labs ordered are listed, but only abnormal results are displayed) Labs Reviewed  CBC WITH DIFFERENTIAL/PLATELET - Abnormal; Notable for the following components:      Result Value   WBC 13.9 (*)    Neutro Abs 10.6 (*)    Monocytes Absolute 1.4 (*)    All other components within normal limits  COMPREHENSIVE METABOLIC PANEL  CK  URINALYSIS, ROUTINE W REFLEX MICROSCOPIC  INFLUENZA PANEL BY PCR (TYPE A & B)    EKG None  Radiology Dg Chest 2 View  Result Date: 01/07/2019 CLINICAL DATA:  Ankle reconstruction today. Subsequent shortness of breath and right buttock pain. EXAM: CHEST - 2 VIEW COMPARISON:  02/06/2011 FINDINGS: Shallow inspiration. The heart size and mediastinal contours are within normal limits. Both lungs are clear. The visualized skeletal structures are unremarkable. IMPRESSION: No active cardiopulmonary disease. Electronically Signed   By: Lucienne Capers M.D.   On: 01/07/2019 22:30    Procedures Procedures (including critical care time)  Medications Ordered in ED Medications  diazepam (VALIUM) tablet 5 mg (5 mg Oral Given 01/07/19 2251)  gabapentin (NEURONTIN) tablet 300 mg (300 mg Oral Given 01/07/19 2252)     Initial Impression / Assessment and Plan / ED Course  I have reviewed the triage vital signs and the nursing notes.  Pertinent labs & imaging results that were available during my care of the patient were reviewed by me and considered in my medical decision making (see chart for details).  63 year old male presents with severe R buttocks pain after a 4 hour  surgery on his ankle today. He is  notably febrile in triage. No tachycardia, hypoxia, or hypotension. On exam he has tenderness of the right buttock. No tenderness of the thigh, knee, leg or ankle. He has a strong DP pulse. He has normal strength of the leg. Will obtain labs, CXR, UA and reassess. Discussed with family that imaging of the buttocks isn't indicated as this time as it seems like muscular pain/nerve pain.   Rechecked pt. Pain is better. CBC is remarkable for leukocytosis (13.9). CXR is negative. CMP, CK, UA are pending. Care transferred to [facility] PA-C who will dispo. Due to severe pain and inability to walk he will likely need admission.  Final Clinical Impressions(s) / ED Diagnoses   Final diagnoses:  Right hip pain    ED Discharge Orders    None       Recardo Evangelist, PA-C 01/08/19 0011    Little, Wenda Overland, MD 01/08/19 1101

## 2019-01-07 NOTE — ED Notes (Signed)
Pt reports he had a four hour orthopedic surgery today and was lying on his right hip during the procedure. He was having pain post op in the right hip and was reported at that time, he was given multiple meds for pain, with some subsiding of the pain. He was d/c and while standing in line to get his medications the pain in thee right hip/buttocks started again and he felt some weakness and numbness in the anterior right thigh, which is new. He also has not urinated since before his surgery this morning.

## 2019-01-07 NOTE — Telephone Encounter (Signed)
I called Dawn Rause, representative for [facility], to see if she could assist [place] in the care of Mr. Thielen.  She took down his information and said she would get it to her co-worker, Adela Lank.  She stated he is our representative in [place].  She said he would contact us and let us know if they can assist.  I have tried several places for home health care such as [facility], [facility], [facility], and [facility].  They either don't have sufficient staff or they are not in-network with the patient's insurance company.    [facility] is working on getting the patient his knee scooter.  I spoke to [facility] this morning.

## 2019-01-07 NOTE — Progress Notes (Signed)
While the surgical cente postoperatively the patient was having right buttock and leg pain postoperatively.  When they were trying to get him out of the stretcher in the chair he was having quite a bit of pain.  He was given morphine as well as Percocet and continued to have pain.  He was then given 5 mg of Valium and since taking that his symptoms are much improved.  He denies any pain to his left lower extremity or other areas of the right leg.  He denies any calf pain, chest pain, shortness of breath.  I came to evaluate the patient because I was called about this.  Upon my evaluation of him he has 1 specific area pinpoint tenderness along the right buttock.  He states that the pain is improving.  When I came to see him his pain was around a 5/10.  After further talking him is down to 4/10.  He states that initially he was not able to move his leg without pain and when I was seeing him he was able to move his leg with minimal discomfort.  He does relate his symptoms are much improved.  Clinically he does not have a hematoma or any other signs of DVT or PE.  No signs of compartment syndrome.  He does state he gets cramps quite a bit in general.  We really get him into a chair as long as he is feeling well and his symptoms are improving while sitting up when discharged home I ordered p.o. Valium for him for muscle spasms for his legs as needed.  However if symptoms are worsening we were transferred to [facility] for evaluation and observation.  However his symptoms are improving so as long as he continues to improve we will discharge home but he was given strict precautions to go to the emergency room if there are any worsening of symptoms or if there is any signs or symptoms of DVT, PE or compartment syndrome which we discussed.  His wife is at bedside we had a long discussion regards to this and they had no further questions or concerns.  They are in agreement about this plan.  Celesta Gentile, DPM O:  [phone] C: [phone]

## 2019-01-07 NOTE — Telephone Encounter (Signed)
"  This is Leon Nelson over at [facility].  I got your message.  [name] is not in-network.  I can't take his particular insurance plan."

## 2019-01-07 NOTE — ED Triage Notes (Signed)
Pt c/o buttock pain that started today after a 4 hour surgery on the left ankle. Pain initially started while pt in recovery. Given 4mg  morphine, oxy, and valium prior to discharge, pain improved. Pt states he was advised to come to ED if pain returned.

## 2019-01-07 NOTE — ED Notes (Signed)
Pt just reported that he took oxycodone in the waiting area 1.5 hours ago.

## 2019-01-07 NOTE — ED Provider Notes (Signed)
63 year old male received at sign out from [place] pending   "Leon Nelson is a 63 y.o. male who presents with R hip pain. PMH significant for obesity, DM, HTN, HLD, hypothyroid, chronic bilateral ankle pain. He states that he had surgery at the outpatient center by podiatry this morning (Dr. Albesa Seen). The surgery was ~4 hours. After he got up from surgery he started to have severe pain in his R buttock and numbness of his right thigh. They thought it was most likely musculoskeletal. He was given morphine, oxycodone, and valium which reduced his pain to 4/10 and he was discharged home. He was advised to come to the ED if worsening. He needed help to get in the car. His wife notes that they didn't even make it home before the pain started returning so they came to the ED. He denies weakness of the R leg. The pain does not radiate. No numbness/tingling of the lower leg. He denies left leg pain and states he had a nerve block of the left ankle. He was noted to be febrile in triage. He reports URI symptoms of runny nose and cough productive of white sputum. He denies any chest pain, SOB, abdominal pain, N/V. He also notes he hasn't urinate or had a BM since this morning and he's been drinking a lot of water."  Physical Exam  BP 103/67   Pulse 84   Temp 98.1 F (36.7 C) (Oral)   Resp 16   SpO2 94%   Physical Exam  Obese male.  Appears uncomfortable.  Splint in place over the left lower extremity.  Abdomen is obese, but soft and nondistended.  ED Course/Procedures     Procedures  MDM   63 year old male with a h/o of obesity, DM, HTN, HLD, hypothyroid, chronic bilateral ankle pain received at sign out from [facility] pending labs and pain control.  Ports he had not voided since earlier today, but was able to provide a urine sample in the ED.  Labs are notable for CK of 9699.  IV fluids initiated in the ED.  His AST and ALT are also acutely elevated at 142 and 62.  Alk phos is decreased at 35.  He  has a leukocytosis of 13.9, likely reactive.  He has been on simvastatin for over 20 years per the patient.  His only new medication is gabapentin, which he started 1 month ago.  CK is significantly elevated for the patient has only had a procedure earlier today.  On my evaluation, he continues to appear uncomfortable.  Consult to the hospitalist team and Dr. Myna Hidalgo will admit. The patient appears reasonably stabilized for admission considering the current resources, flow, and capabilities available in the ED at this time, and I doubt any other [facility] requiring further screening and/or treatment in the ED prior to admission.     Joanne Gavel, PA-C 01/08/19 0145    Ezequiel Essex, MD 01/08/19 [phone]

## 2019-01-08 ENCOUNTER — Encounter (HOSPITAL_COMMUNITY): Payer: Self-pay | Admitting: Family Medicine

## 2019-01-08 ENCOUNTER — Other Ambulatory Visit: Payer: Self-pay

## 2019-01-08 ENCOUNTER — Telehealth: Payer: Self-pay | Admitting: Podiatry

## 2019-01-08 DIAGNOSIS — R6510 Systemic inflammatory response syndrome (SIRS) of non-infectious origin without acute organ dysfunction: Secondary | ICD-10-CM | POA: Diagnosis present

## 2019-01-08 DIAGNOSIS — R7401 Elevation of levels of liver transaminase levels: Secondary | ICD-10-CM | POA: Diagnosis present

## 2019-01-08 DIAGNOSIS — R740 Nonspecific elevation of levels of transaminase and lactic acid dehydrogenase [LDH]: Secondary | ICD-10-CM

## 2019-01-08 DIAGNOSIS — I10 Essential (primary) hypertension: Secondary | ICD-10-CM | POA: Diagnosis not present

## 2019-01-08 DIAGNOSIS — E1149 Type 2 diabetes mellitus with other diabetic neurological complication: Secondary | ICD-10-CM

## 2019-01-08 DIAGNOSIS — T796XXA Traumatic ischemia of muscle, initial encounter: Secondary | ICD-10-CM | POA: Diagnosis not present

## 2019-01-08 DIAGNOSIS — E039 Hypothyroidism, unspecified: Secondary | ICD-10-CM

## 2019-01-08 DIAGNOSIS — M6282 Rhabdomyolysis: Secondary | ICD-10-CM | POA: Diagnosis present

## 2019-01-08 LAB — URINALYSIS, ROUTINE W REFLEX MICROSCOPIC
Bacteria, UA: NONE SEEN
Bilirubin Urine: NEGATIVE
Glucose, UA: NEGATIVE mg/dL
Ketones, ur: NEGATIVE mg/dL
Leukocytes, UA: NEGATIVE
Nitrite: NEGATIVE
Protein, ur: NEGATIVE mg/dL
SPECIFIC GRAVITY, URINE: 1.018 (ref 1.005–1.030)
pH: 5 (ref 5.0–8.0)

## 2019-01-08 LAB — COMPREHENSIVE METABOLIC PANEL
ALK PHOS: 34 U/L — AB (ref 38–126)
ALT: 62 U/L — AB (ref 0–44)
ALT: 58 U/L — ABNORMAL HIGH (ref 0–44)
AST: 142 U/L — AB (ref 15–41)
AST: 134 U/L — ABNORMAL HIGH (ref 15–41)
Albumin: 3.5 g/dL (ref 3.5–5.0)
Albumin: 3.3 g/dL — ABNORMAL LOW (ref 3.5–5.0)
Alkaline Phosphatase: 35 U/L — ABNORMAL LOW (ref 38–126)
Anion gap: 12 (ref 5–15)
Anion gap: 12 (ref 5–15)
BILIRUBIN TOTAL: 0.7 mg/dL (ref 0.3–1.2)
BUN: 24 mg/dL — AB (ref 8–23)
BUN: 24 mg/dL — ABNORMAL HIGH (ref 8–23)
CALCIUM: 8.7 mg/dL — AB (ref 8.9–10.3)
CO2: 25 mmol/L (ref 22–32)
CO2: 24 mmol/L (ref 22–32)
Calcium: 8.5 mg/dL — ABNORMAL LOW (ref 8.9–10.3)
Chloride: 99 mmol/L (ref 98–111)
Chloride: 100 mmol/L (ref 98–111)
Creatinine, Ser: 0.95 mg/dL (ref 0.61–1.24)
Creatinine, Ser: 1.05 mg/dL (ref 0.61–1.24)
GFR calc Af Amer: >60 mL/min (ref >60)
GFR calc Af Amer: >60 mL/min (ref >60)
GFR calc non Af Amer: >60 mL/min (ref >60)
GFR calc non Af Amer: >60 mL/min (ref >60)
Glucose, Bld: 156 mg/dL — ABNORMAL HIGH (ref 70–99)
Glucose, Bld: 167 mg/dL — ABNORMAL HIGH (ref 70–99)
Potassium: 4.2 mmol/L (ref 3.5–5.1)
Potassium: 4 mmol/L (ref 3.5–5.1)
Sodium: 136 mmol/L (ref 135–145)
Sodium: 136 mmol/L (ref 135–145)
Total Bilirubin: 0.6 mg/dL (ref 0.3–1.2)
Total Protein: 6.6 g/dL (ref 6.5–8.1)
Total Protein: 6.1 g/dL — ABNORMAL LOW (ref 6.5–8.1)

## 2019-01-08 LAB — CBC WITH DIFFERENTIAL/PLATELET
ABS IMMATURE GRANULOCYTES: 0.03 10*3/uL (ref 0.00–0.07)
Basophils Absolute: 0 10*3/uL (ref 0.0–0.1)
Basophils Relative: 0 %
EOS ABS: 0.1 10*3/uL (ref 0.0–0.5)
Eosinophils Relative: 1 %
HCT: 37.8 % — ABNORMAL LOW (ref 39.0–52.0)
Hemoglobin: 12.6 g/dL — ABNORMAL LOW (ref 13.0–17.0)
IMMATURE GRANULOCYTES: 0 %
Lymphocytes Relative: 19 %
Lymphs Abs: 2.5 10*3/uL (ref 0.7–4.0)
MCH: 29.6 pg (ref 26.0–34.0)
MCHC: 33.3 g/dL (ref 30.0–36.0)
MCV: 88.7 fL (ref 80.0–100.0)
Monocytes Absolute: 1.5 10*3/uL — ABNORMAL HIGH (ref 0.1–1.0)
Monocytes Relative: 11 %
Neutro Abs: 9 10*3/uL — ABNORMAL HIGH (ref 1.7–7.7)
Neutrophils Relative %: 69 %
Platelets: 240 10*3/uL (ref 150–400)
RBC: 4.26 MIL/uL (ref 4.22–5.81)
RDW: 12.6 % (ref 11.5–15.5)
WBC: 13.1 10*3/uL — ABNORMAL HIGH (ref 4.0–10.5)
nRBC: 0 % (ref 0.0–0.2)

## 2019-01-08 LAB — GLUCOSE, CAPILLARY
Glucose-Capillary: 169 mg/dL — ABNORMAL HIGH (ref 70–99)
Glucose-Capillary: 173 mg/dL — ABNORMAL HIGH (ref 70–99)
Glucose-Capillary: 164 mg/dL — ABNORMAL HIGH (ref 70–99)
Glucose-Capillary: 174 mg/dL — ABNORMAL HIGH (ref 70–99)
Glucose-Capillary: 161 mg/dL — ABNORMAL HIGH (ref 70–99)

## 2019-01-08 LAB — CK
Total CK: 9699 U/L — ABNORMAL HIGH (ref 49–397)
Total CK: 7678 U/L — ABNORMAL HIGH (ref 49–397)

## 2019-01-08 LAB — INFLUENZA PANEL BY PCR (TYPE A & B)
Influenza A By PCR: NEGATIVE
Influenza B By PCR: NEGATIVE

## 2019-01-08 MED ORDER — BISACODYL 10 MG RE SUPP
10.0000 mg | Freq: Every day | RECTAL | Status: DC | PRN
  Administered 2019-01-09: 10 mg via RECTAL
  Filled 2019-01-08: qty 1

## 2019-01-08 MED ORDER — OXYCODONE-ACETAMINOPHEN 5-325 MG PO TABS
1.0000 | ORAL_TABLET | ORAL | Status: DC | PRN
  Administered 2019-01-08 (×4): 1 via ORAL
  Filled 2019-01-08 (×4): qty 1

## 2019-01-08 MED ORDER — VENLAFAXINE HCL ER 75 MG PO CP24
75.0000 mg | ORAL_CAPSULE | Freq: Every day | ORAL | Status: DC
  Administered 2019-01-08 – 2019-01-15 (×8): 75 mg via ORAL
  Filled 2019-01-08 (×8): qty 1

## 2019-01-08 MED ORDER — SODIUM CHLORIDE 0.9 % IV SOLN
INTRAVENOUS | Status: DC
  Administered 2019-01-08 – 2019-01-11 (×7): via INTRAVENOUS

## 2019-01-08 MED ORDER — DIAZEPAM 5 MG PO TABS
5.0000 mg | ORAL_TABLET | Freq: Three times a day (TID) | ORAL | Status: DC | PRN
  Administered 2019-01-08: 5 mg via ORAL
  Filled 2019-01-08: qty 1

## 2019-01-08 MED ORDER — MORPHINE SULFATE (PF) 4 MG/ML IV SOLN
4.0000 mg | Freq: Once | INTRAVENOUS | Status: AC
  Administered 2019-01-08: 4 mg via INTRAVENOUS
  Filled 2019-01-08: qty 1

## 2019-01-08 MED ORDER — ONDANSETRON HCL 4 MG/2ML IJ SOLN: 4.0000 mg | Freq: Four times a day (QID) | INTRAMUSCULAR | Status: DC | PRN

## 2019-01-08 MED ORDER — ACETAMINOPHEN 325 MG PO TABS
650.0000 mg | ORAL_TABLET | Freq: Four times a day (QID) | ORAL | Status: DC | PRN
  Administered 2019-01-08 – 2019-01-12 (×5): 650 mg via ORAL
  Filled 2019-01-08 (×5): qty 2

## 2019-01-08 MED ORDER — ENOXAPARIN SODIUM 80 MG/0.8ML ~~LOC~~ SOLN
70.0000 mg | SUBCUTANEOUS | Status: DC
  Administered 2019-01-08 – 2019-01-15 (×8): 70 mg via SUBCUTANEOUS
  Filled 2019-01-08 (×8): qty 0.8

## 2019-01-08 MED ORDER — POLYETHYLENE GLYCOL 3350 17 G PO PACK
17.0000 g | PACK | Freq: Every day | ORAL | Status: DC | PRN
  Administered 2019-01-08: 17 g via ORAL
  Filled 2019-01-08: qty 1

## 2019-01-08 MED ORDER — GABAPENTIN 300 MG PO CAPS
300.0000 mg | ORAL_CAPSULE | Freq: Every day | ORAL | Status: DC
  Administered 2019-01-08 – 2019-01-14 (×7): 300 mg via ORAL
  Filled 2019-01-08 (×7): qty 1

## 2019-01-08 MED ORDER — INSULIN ASPART 100 UNIT/ML ~~LOC~~ SOLN
0.0000 [IU] | Freq: Every day | SUBCUTANEOUS | Status: DC
  Administered 2019-01-13: 2 [IU] via SUBCUTANEOUS

## 2019-01-08 MED ORDER — DICYCLOMINE HCL 20 MG PO TABS: 20.0000 mg | ORAL_TABLET | Freq: Three times a day (TID) | ORAL | Status: DC | PRN

## 2019-01-08 MED ORDER — LEVOTHYROXINE SODIUM 50 MCG PO TABS
50.0000 ug | ORAL_TABLET | Freq: Every day | ORAL | Status: DC
  Administered 2019-01-08 – 2019-01-15 (×8): 50 ug via ORAL
  Filled 2019-01-08 (×9): qty 1

## 2019-01-08 MED ORDER — ONDANSETRON HCL 4 MG PO TABS: 4.0000 mg | ORAL_TABLET | Freq: Four times a day (QID) | ORAL | Status: DC | PRN

## 2019-01-08 MED ORDER — ACETAMINOPHEN 650 MG RE SUPP: 650.0000 mg | Freq: Four times a day (QID) | RECTAL | Status: DC | PRN

## 2019-01-08 MED ORDER — ALLOPURINOL 300 MG PO TABS
300.0000 mg | ORAL_TABLET | Freq: Every day | ORAL | Status: DC
  Administered 2019-01-08 – 2019-01-15 (×8): 300 mg via ORAL
  Filled 2019-01-08 (×8): qty 1

## 2019-01-08 MED ORDER — OXYCODONE-ACETAMINOPHEN 10-325 MG PO TABS: 1.0000 | ORAL_TABLET | ORAL | Status: DC | PRN

## 2019-01-08 MED ORDER — INSULIN ASPART 100 UNIT/ML ~~LOC~~ SOLN
0.0000 [IU] | Freq: Three times a day (TID) | SUBCUTANEOUS | Status: DC
  Administered 2019-01-08 – 2019-01-09 (×6): 2 [IU] via SUBCUTANEOUS
  Administered 2019-01-10: 3 [IU] via SUBCUTANEOUS
  Administered 2019-01-10 (×2): 2 [IU] via SUBCUTANEOUS
  Administered 2019-01-11 (×2): 1 [IU] via SUBCUTANEOUS
  Administered 2019-01-11: 3 [IU] via SUBCUTANEOUS
  Administered 2019-01-12: 0 [IU] via SUBCUTANEOUS
  Administered 2019-01-12 (×2): 2 [IU] via SUBCUTANEOUS
  Administered 2019-01-13: 3 [IU] via SUBCUTANEOUS
  Administered 2019-01-13: 1 [IU] via SUBCUTANEOUS
  Administered 2019-01-14: 2 [IU] via SUBCUTANEOUS
  Administered 2019-01-14: 5 [IU] via SUBCUTANEOUS
  Administered 2019-01-14 – 2019-01-15 (×2): 1 [IU] via SUBCUTANEOUS
  Administered 2019-01-15: 2 [IU] via SUBCUTANEOUS

## 2019-01-08 MED ORDER — OXYCODONE HCL 5 MG PO TABS
5.0000 mg | ORAL_TABLET | ORAL | Status: DC | PRN
  Administered 2019-01-08 (×4): 5 mg via ORAL
  Filled 2019-01-08 (×4): qty 1

## 2019-01-08 MED ORDER — SODIUM CHLORIDE 0.9 % IV SOLN
INTRAVENOUS | Status: AC
  Administered 2019-01-08 (×2): via INTRAVENOUS

## 2019-01-08 MED ORDER — MORPHINE SULFATE (PF) 4 MG/ML IV SOLN
4.0000 mg | INTRAVENOUS | Status: DC | PRN
  Administered 2019-01-08 (×2): 4 mg via INTRAVENOUS
  Filled 2019-01-08 (×2): qty 1

## 2019-01-08 MED ORDER — SODIUM CHLORIDE 0.9 % IV BOLUS
1000.0000 mL | Freq: Once | INTRAVENOUS | Status: AC
  Administered 2019-01-08: 1000 mL via INTRAVENOUS

## 2019-01-08 NOTE — Care Management (Signed)
Per office notes 1-15, they attempted to set up [facility].  The following agencies could not take: [place], [facility], [facility], [place], [place]. [facility] was working on getting a knee scooter prior to admission.

## 2019-01-08 NOTE — Telephone Encounter (Signed)
I am going to go to the hospital to see him tonight. I will talk to him.

## 2019-01-08 NOTE — Plan of Care (Signed)
?  Problem: Clinical Measurements: ?Goal: Ability to maintain clinical measurements within normal limits will improve ?Outcome: Progressing ?  ?Problem: Activity: ?Goal: Risk for activity intolerance will decrease ?Outcome: Progressing ?  ?Problem: Nutrition: ?Goal: Adequate nutrition will be maintained ?Outcome: Progressing ?  ?Problem: Elimination: ?Goal: Will not experience complications related to bowel motility ?Outcome: Progressing ?  ?Problem: Pain Managment: ?Goal: General experience of comfort will improve ?Outcome: Progressing ?  ?

## 2019-01-08 NOTE — Progress Notes (Signed)
Subjective: Leon Nelson is POD #1 s/p left foot triple arthrodesis.  Postoperatively he started developed right hip pain that was treated with morphine, Percocet and he continued have pain and therefore was given 5 mg of Valium.  We did IM his pain was much improved he is able to move his right leg with significant provement in symptoms.  He was discharged from outpatient surgery center as he was doing better and vital signs were stable.  On his way home he went to pick up his prescriptions and he started having worsening right hip pain.  Because of this he was directed to go to the emergency room.  He was admitted to the hospital overnight and was found to be in rhabdomyolysis.  Kidney function has been preserved however liver enzymes are elevated.  White blood cell count also elevated.  He states the nerve block on his left side is wearing off and he is starting to have discomfort but he is getting pain medicine his pain on his left side is controlled.  His right hip pain is also improved somewhat since when he came to the emergency room overnight. Denies any systemic complaints such as fevers, chills, nausea, vomiting. No acute changes since last appointment, and no other complaints at this time.   Objective: AAO x3, NAD- daughters are at bedside.  DP/PT pulses palpable , CRT less than 3 seconds Posterior splint and bandages are clean, dry, intact to the left lower extremity.  He is able to move his digits and there is no immediate cap refill time to the toes.  On the left side there is no pain with calf compression of the calf is supple. On the right side all compartments appear to be soft.  There is still some tenderness to the right buttock area however he does report subjective improvement. No open lesions or pre-ulcerative lesions.  No pain with calf compression, swelling, warmth, erythema  Assessment: Postop day #1 status post left foot triple arthrodesis; rhabdomyolysis  Plan: -All treatment  options discussed with the patient including all alternatives, risks, complications.  -Overall labs are starting to trend down.  Continue hydration and will continue to monitor lab work.  Overall he is feeling well and his pain is improving.  I think the increased white blood cell count is in response to stress from the rhabdomyolysis as well as the surgery. -From a surgical standpoint nonweightbearing left side.  Ice elevation. He has morphine and Percocet for pain control.  He states this is controlling his pain currently.  Prior to surgery I did have him evaluated by physical therapy to make sure he was safe at home and have the equipment he needed.  He is still waiting for his knee scooter for the left side but apparently is approved.  However since he is having the right leg pain and having difficulty walking before discharge I would like physical therapy to see him and I also discussed with him possibly going to rehab.  He has not talked to his wife about this.  I will continue to follow while he is inpatient with close outpatient follow up. I talked to him about what is going on and he and his daughters don't have any further questions.   Celesta Gentile, DPM O: [phone] C: [phone]

## 2019-01-08 NOTE — Care Management Note (Addendum)
Case Management Note  Patient Details  Name: Leon Nelson MRN: [id number] Date of Birth: 1956/03/03  Subjective/Objective:                    Action/Plan:  Spoke w patient and family in room. He is from home w family has 5 steps to get into house. Patient primarily interested in [facility], is open to SNF if needed, just not [facility]. Multiple agencies declined, see previous notes. Referral was accepted by [facility] (now [facility]). Patient states he has crutches and a potential knee scooter coming through [facility] as set up by office.  Will need HH orders and face to face. Will continue to follow PT recs for DME needs.    Expected Discharge Date:                  Expected Discharge Plan:     In-House Referral:     Discharge planning Services  CM Consult  Post Acute Care Choice:  Home Health Choice offered to:     DME Arranged:    DME Agency:     HH Arranged:    [place] Agency:  [place]  Status of Service:  In process, will continue to follow  If discussed at Long Length of Stay Meetings, dates discussed:    Additional Comments:  Carles Collet, RN 01/08/2019, 4:35 PM

## 2019-01-08 NOTE — Telephone Encounter (Signed)
Please disregard previous request for physical therapy.  Pt returned call stating that he had physical therapy set up already by someone else.

## 2019-01-08 NOTE — Progress Notes (Signed)
Leon Nelson is a 63 y.o. male with medical history significant for  type 2 diabetes mellitus, hypertension, hypothyroidism, and chronic bilateral foot and ankle pain status post triple left foot arthrodesis in the outpatient surgery center today, now presenting to the emergency department with severe pain in the right buttock.  Patient developed severe right buttock pain after his surgery, was treated with morphine, Valium, and Percocet at the surgery center, improved enough to go home, and had severe worsening in his pain at home, prompting his presentation to the ED.  He denies any right buttock pain prior to the surgery and denies any history of similar pain.  He reports some paresthesia in the proximal right leg at the outpatient surgery center, but that has since resolved.  He was unable to bear weight on the right leg, but this was secondary to pain rather than weakness.    Presented with elevated CPK greater than 9000.  Admitted for rhabdomyolysis.  01/08/2019: Patient seen and examined with his daughters at bedside.  Pain is well controlled on current pain management.  CPK trending down from greater than 9000 to greater than 7000 this morning.  Continue with IV fluid hydration normal saline at 125 cc/h.  Repeat CPK in the morning.  Vital signs are stable.  Continue to monitor vital signs.  Please refer to H&P dictated by Dr. Myna Hidalgo on 01/08/2019 for further details of the assessment and plan.

## 2019-01-08 NOTE — H&P (Signed)
History and Physical    SHAQUON GROPP ZOX:[id number] DOB: [birth year] DOA: 01/07/2019  PCP: Biagio Borg, MD   Patient coming from: Home, by way of outpatient surgery center   Chief Complaint: Right buttock pain   HPI: Leon Nelson is a 63 y.o. male with medical history significant for  type 2 diabetes mellitus, hypertension, hypothyroidism, and chronic bilateral foot and ankle pain status post triple left foot arthrodesis in the outpatient surgery center today, now presenting to the emergency department with severe pain in the right buttock.  Patient developed severe right buttock pain after his surgery, was treated with morphine, Valium, and Percocet at the surgery center, improved enough to go home, and had severe worsening in his pain at home, prompting his presentation to the ED.  He denies any right buttock pain prior to the surgery and denies any history of similar pain.  He reports some paresthesia in the proximal right leg at the outpatient surgery center, but that has since resolved.  He was unable to bear weight on the right leg, but this was secondary to pain rather than weakness.  He reports a recent URI that he has recovered from, reports he is having an uneventful day prior to the surgery, denies any recent chest pain, cough, or shortness of breath.  No significant abdominal pain and no nausea, vomiting, or diarrhea.  ED Course: Upon arrival to the ED, patient is found to be febrile to 38.2 C, saturating well on room air, blood pressure 103/68, and vitals otherwise normal.  EKG features a sinus rhythm and chest x-ray is negative for acute cardiopulmonary disease.  Chemistry panel is notable for mild elevation in transaminases and serum CK is elevated to 9699.  CBC features a leukocytosis to 13,900 and urinalysis is notable for moderate hemoglobin without RBCs on micro.  Patient was given a liter of normal saline, 5 mg oral Valium, Neurontin, and morphine in the ED.  Reports  improvement with this and will be observed for further evaluation and management of rhabdomyolysis.  Review of Systems:  All other systems reviewed and apart from HPI, are negative.  Past Medical History:  Diagnosis Date  . ABDOMINAL PAIN OTHER SPECIFIED SITE 07/20/2008  . CHEST PAIN 02/06/2011  . COLONIC POLYPS, HX OF 05/26/2008  . DEPRESSION 05/26/2008  . DIABETES MELLITUS, TYPE II 05/26/2008  . HYPERLIPIDEMIA 05/26/2008  . HYPERTENSION 05/26/2008  . HYPOTHYROIDISM 05/26/2008  . KNEE PAIN, RIGHT, ACUTE 07/01/2008  . Personal history of urinary calculi 05/26/2008  . UTI'S, HX OF 05/26/2008    Past Surgical History:  Procedure Laterality Date  . s/p right knee arthroplasty      complicated by patellar tendon rupture Jan 2011 Dr. Gladstone Lighter  . TONSILLECTOMY       reports that he has never smoked. He has never used smokeless tobacco. He reports that he does not drink alcohol or use drugs.  Allergies  Allergen Reactions  . Penicillins Other (See Comments)    Blisters on hands  . Sulfonamide Derivatives Other (See Comments)    Blisters in groin area    Family History  Problem Relation Age of Onset  . Cancer Mother        breast cancer  . Hyperlipidemia Mother   . Heart disease Mother   . Hyperlipidemia Father   . Heart disease Father   . Alcohol abuse Other   . Diabetes Other      Prior to Admission medications   Medication Sig Start  Date End Date Taking? Authorizing Provider  allopurinol (ZYLOPRIM) 300 MG tablet TAKE 1 TABLET DAILY Patient taking differently: Take 300 mg by mouth daily.  12/25/18  Yes Biagio Borg, MD  atenolol-chlorthalidone (TENORETIC) 50-25 MG tablet TAKE ONE-HALF (1/2) TABLET DAILY Patient taking differently: Take 0.5 tablets by mouth daily. TAKE ONE-HALF (1/2) TABLET DAILY 12/25/18  Yes Biagio Borg, MD  diclofenac sodium (VOLTAREN) 1 % GEL Apply 2 g topically 4 (four) times daily as needed. Patient taking differently: Apply 2 g topically 4 (four) times daily as  needed (pain).  10/29/18  Yes Biagio Borg, MD  dicyclomine (BENTYL) 20 MG tablet TAKE 1 TABLET BY MOUTH THREE TIMES DAILY AS NEEDED Patient taking differently: Take 20 mg by mouth daily.  11/17/18  Yes Biagio Borg, MD  gabapentin (NEURONTIN) 300 MG capsule TAKE 1 CAPSULE BY MOUTH EVERYDAY AT BEDTIME Patient taking differently: Take 300 mg by mouth at bedtime.  10/20/18  Yes Trula Slade, DPM  glipiZIDE (GLUCOTROL XL) 10 MG 24 hr tablet TAKE 1 TABLET DAILY WITH BREAKFAST Patient taking differently: Take 10 mg by mouth daily.  09/24/18  Yes Biagio Borg, MD  levothyroxine (SYNTHROID, LEVOTHROID) 50 MCG tablet Take 1 tablet (50 mcg total) by mouth daily. 01/01/19  Yes Biagio Borg, MD  lisinopril (PRINIVIL,ZESTRIL) 20 MG tablet Take 1 tablet (20 mg total) by mouth daily. 12/06/18  Yes Biagio Borg, MD  metFORMIN (GLUCOPHAGE-XR) 500 MG 24 hr tablet TAKE 3 TABLETS IN THE MORNING Patient taking differently: Take 1,500 mg by mouth daily.  12/25/18  Yes Biagio Borg, MD  oxyCODONE-acetaminophen (PERCOCET) 10-325 MG tablet Take 1 tablet by mouth every 4 (four) hours as needed for pain. MAXIMUM TOTAL ACETAMINOPHEN DOSE IS 4000 MG PER DAY 01/06/19  Yes Trula Slade, DPM  potassium chloride SA (K-DUR,KLOR-CON) 20 MEQ tablet TAKE 2 TABLETS DAILY Patient taking differently: Take 30 mEq by mouth daily.  12/25/18  Yes Biagio Borg, MD  sildenafil (VIAGRA) 100 MG tablet Take 0.5-1 tablets (50-100 mg total) by mouth daily as needed. Patient taking differently: Take 50-100 mg by mouth daily as needed for erectile dysfunction.  09/25/16  Yes Biagio Borg, MD  simvastatin (ZOCOR) 80 MG tablet TAKE 1 TABLET AT BEDTIME Patient taking differently: Take 80 mg by mouth daily.  09/29/18  Yes Biagio Borg, MD  venlafaxine XR (EFFEXOR-XR) 75 MG 24 hr capsule TAKE 1 CAPSULE DAILY Patient taking differently: Take 75 mg by mouth daily.  09/29/18  Yes Biagio Borg, MD  Vitamin D, Ergocalciferol, (DRISDOL) 1.25 MG  (50000 UT) CAPS capsule Take 1 capsule (50,000 Units total) by mouth every 7 (seven) days. 12/05/18  Yes Trula Slade, DPM  clindamycin (CLEOCIN) 300 MG capsule Take 1 capsule (300 mg total) by mouth 3 (three) times daily. Patient not taking: Reported on 01/08/2019 01/06/19   Trula Slade, DPM  diazepam (VALIUM) 5 MG tablet Take 1 tablet (5 mg total) by mouth every 12 (twelve) hours as needed for muscle spasms. Patient not taking: Reported on 01/08/2019 01/07/19   Trula Slade, DPM  enoxaparin (LOVENOX) 40 MG/0.4ML injection Inject 0.4 mLs (40 mg total) into the skin daily. Patient not taking: Reported on 01/08/2019 01/06/19   Trula Slade, DPM  promethazine (PHENERGAN) 25 MG tablet Take 1 tablet (25 mg total) by mouth every 8 (eight) hours as needed for nausea or vomiting. Patient not taking: Reported on 01/08/2019 01/06/19   Jacqualyn Posey,  Bonna Gains, DPM    Physical Exam: Vitals:   01/07/19 2315 01/08/19 0000 01/08/19 0030 01/08/19 0100  BP: 121/67 101/62 93/65 103/67  Pulse: 95 87 84 84  Resp:      Temp:      TempSrc:      SpO2: 92% 94% 92% 94%    Constitutional: NAD, calm, appears uncomfortable Eyes: PERTLA, lids and conjunctivae normal ENMT: Mucous membranes are moist. Posterior pharynx clear of any exudate or lesions.   Neck: normal, supple, no masses, no thyromegaly Respiratory: clear to auscultation bilaterally, no wheezing, no crackles. Normal respiratory effort.   Cardiovascular: S1 & S2 heard, regular rate and rhythm. Trace pedal edema. Abdomen: No distension, no tenderness, soft. Bowel sounds active.  Musculoskeletal: no clubbing / cyanosis. Distal LLE with clean and dry surgical dressing, neurovascularly intact left foot. Tender in right buttock but soft and neurovascularly intact.    Skin: no significant rashes, lesions, ulcers. Warm, dry, well-perfused. Neurologic: No facial asymmetry. Sensation intact. Strength 5/5 in all 4 limbs.  Psychiatric: Alert and  oriented x 3. Pleasant and cooperative.    Labs on Admission: I have personally reviewed following labs and imaging studies  CBC: Recent Labs  Lab 01/07/19 2301  WBC 13.9*  NEUTROABS 10.6*  HGB 13.3  HCT 40.6  MCV 88.1  PLT 034   Basic Metabolic Panel: Recent Labs  Lab 01/07/19 2301  NA 136  K 4.2  CL 99  CO2 25  GLUCOSE 156*  BUN 24*  CREATININE 0.95  CALCIUM 8.7*   GFR: CrCl cannot be calculated (Unknown ideal weight.). Liver Function Tests: Recent Labs  Lab 01/07/19 2301  AST 142*  ALT 62*  ALKPHOS 35*  BILITOT 0.6  PROT 6.6  ALBUMIN 3.5   No results for input(s): LIPASE, AMYLASE in the last 168 hours. No results for input(s): AMMONIA in the last 168 hours. Coagulation Profile: No results for input(s): INR, PROTIME in the last 168 hours. Cardiac Enzymes: Recent Labs  Lab 01/07/19 2301  CKTOTAL 9,699*   BNP (last 3 results) No results for input(s): PROBNP in the last 8760 hours. HbA1C: No results for input(s): HGBA1C in the last 72 hours. CBG: No results for input(s): GLUCAP in the last 168 hours. Lipid Profile: No results for input(s): CHOL, HDL, LDLCALC, TRIG, CHOLHDL, LDLDIRECT in the last 72 hours. Thyroid Function Tests: No results for input(s): TSH, T4TOTAL, FREET4, T3FREE, THYROIDAB in the last 72 hours. Anemia Panel: No results for input(s): VITAMINB12, FOLATE, FERRITIN, TIBC, IRON, RETICCTPCT in the last 72 hours. Urine analysis:    Component Value Date/Time   COLORURINE YELLOW 01/07/2019 2331   APPEARANCEUR HAZY (A) 01/07/2019 2331   LABSPEC 1.018 01/07/2019 2331   PHURINE 5.0 01/07/2019 2331   GLUCOSEU NEGATIVE 01/07/2019 2331   GLUCOSEU NEGATIVE 10/27/2018 0838   HGBUR MODERATE (A) 01/07/2019 2331   BILIRUBINUR NEGATIVE 01/07/2019 2331   KETONESUR NEGATIVE 01/07/2019 2331   PROTEINUR NEGATIVE 01/07/2019 2331   UROBILINOGEN 0.2 10/27/2018 0838   NITRITE NEGATIVE 01/07/2019 2331   LEUKOCYTESUR NEGATIVE 01/07/2019 2331    Sepsis Labs: @LABRCNTIP (procalcitonin:4,lacticidven:4) )No results found for this or any previous visit (from the past 240 hour(s)).   Radiological Exams on Admission: Dg Chest 2 View  Result Date: 01/07/2019 CLINICAL DATA:  Ankle reconstruction today. Subsequent shortness of breath and right buttock pain. EXAM: CHEST - 2 VIEW COMPARISON:  02/06/2011 FINDINGS: Shallow inspiration. The heart size and mediastinal contours are within normal limits. Both lungs are clear. The visualized  skeletal structures are unremarkable. IMPRESSION: No active cardiopulmonary disease. Electronically Signed   By: Lucienne Capers M.D.   On: 01/07/2019 22:30    EKG: Independently reviewed. Sinus rhythm.   Assessment/Plan  1. Rhabdomyolysis  - Presents after outpatient left foot surgery with severe pain in right buttock and found to have serum CK of 9699 with preserved renal function, "moderate" Hgb on UA without rbcs  - Compartments are soft, right foot is warm and well-perfused, and motor/sensory function preserved  - He was given 1 liter NS in ED  - Continue aggressive IVF hydration, serial exams, monitor electrolytes and renal function   2. SIRS  - There was a transient fever in ED and leukocytosis noted  - CXR is clear, UA not suggestive of infection, and no other evidence for infection identified  - Likely secondary to #1  - Culture if fever returns, follow clinical course off of antibiotics    3. Type II DM  - A1c was 6.7% in November  - Managed at home with metformin and glipizide, held on admission  - Check CBG's and use SSI with Novolog while in hospital   4. Hypertension  - BP is low-normal in ED and antihypertensive held on admission    5. Hypothyroidism  - Continue Synthroid    6. Elevated transaminases  - Mild elevation in transaminases noted with normal t bili, benign exam, levels were normal in November 2019  - Likely secondary to rhabdomyolysis, will trend    7. Status-post  left foot surgery on 01/07/19  - Underwent triple arthrodesis left foot 01/07/19  - Neurovascularly intact, bandages clean and dry    DVT prophylaxis: Lovenox  Code Status: Full  Family Communication: Wife updated at bedside Consults called: none Admission status: Observation     Vianne Bulls, MD [facility] Pager [phone]  If 7PM-7AM, please contact night-coverage [email or web address] Password [email or web address]  01/08/2019, 1:41 AM

## 2019-01-08 NOTE — Telephone Encounter (Signed)
Pt was told to call the office today to let us know if he's interested in in-house therapy/rehab after getting out of the hospital. Pt calling to say that he is interested in having therapy. Please give pt a call

## 2019-01-09 ENCOUNTER — Telehealth: Payer: Self-pay | Admitting: Podiatry

## 2019-01-09 DIAGNOSIS — Z833 Family history of diabetes mellitus: Secondary | ICD-10-CM | POA: Diagnosis not present

## 2019-01-09 DIAGNOSIS — Z87442 Personal history of urinary calculi: Secondary | ICD-10-CM | POA: Diagnosis not present

## 2019-01-09 DIAGNOSIS — Z7984 Long term (current) use of oral hypoglycemic drugs: Secondary | ICD-10-CM | POA: Diagnosis not present

## 2019-01-09 DIAGNOSIS — M6282 Rhabdomyolysis: Secondary | ICD-10-CM | POA: Diagnosis present

## 2019-01-09 DIAGNOSIS — Z79890 Hormone replacement therapy (postmenopausal): Secondary | ICD-10-CM | POA: Diagnosis not present

## 2019-01-09 DIAGNOSIS — Z880 Allergy status to penicillin: Secondary | ICD-10-CM | POA: Diagnosis not present

## 2019-01-09 DIAGNOSIS — I10 Essential (primary) hypertension: Secondary | ICD-10-CM | POA: Diagnosis not present

## 2019-01-09 DIAGNOSIS — R52 Pain, unspecified: Secondary | ICD-10-CM | POA: Diagnosis not present

## 2019-01-09 DIAGNOSIS — Z743 Need for continuous supervision: Secondary | ICD-10-CM | POA: Diagnosis not present

## 2019-01-09 DIAGNOSIS — M25551 Pain in right hip: Secondary | ICD-10-CM | POA: Diagnosis present

## 2019-01-09 DIAGNOSIS — F329 Major depressive disorder, single episode, unspecified: Secondary | ICD-10-CM | POA: Diagnosis present

## 2019-01-09 DIAGNOSIS — Z8249 Family history of ischemic heart disease and other diseases of the circulatory system: Secondary | ICD-10-CM | POA: Diagnosis not present

## 2019-01-09 DIAGNOSIS — E1165 Type 2 diabetes mellitus with hyperglycemia: Secondary | ICD-10-CM | POA: Diagnosis present

## 2019-01-09 DIAGNOSIS — R531 Weakness: Secondary | ICD-10-CM | POA: Diagnosis not present

## 2019-01-09 DIAGNOSIS — X58XXXA Exposure to other specified factors, initial encounter: Secondary | ICD-10-CM | POA: Diagnosis present

## 2019-01-09 DIAGNOSIS — Z981 Arthrodesis status: Secondary | ICD-10-CM | POA: Diagnosis not present

## 2019-01-09 DIAGNOSIS — Z882 Allergy status to sulfonamides status: Secondary | ICD-10-CM | POA: Diagnosis not present

## 2019-01-09 DIAGNOSIS — Z96651 Presence of right artificial knee joint: Secondary | ICD-10-CM | POA: Diagnosis present

## 2019-01-09 DIAGNOSIS — E039 Hypothyroidism, unspecified: Secondary | ICD-10-CM | POA: Diagnosis present

## 2019-01-09 DIAGNOSIS — T796XXA Traumatic ischemia of muscle, initial encounter: Secondary | ICD-10-CM | POA: Diagnosis not present

## 2019-01-09 DIAGNOSIS — Z803 Family history of malignant neoplasm of breast: Secondary | ICD-10-CM | POA: Diagnosis not present

## 2019-01-09 DIAGNOSIS — R279 Unspecified lack of coordination: Secondary | ICD-10-CM | POA: Diagnosis not present

## 2019-01-09 DIAGNOSIS — Z6841 Body Mass Index (BMI) 40.0 and over, adult: Secondary | ICD-10-CM | POA: Diagnosis not present

## 2019-01-09 DIAGNOSIS — E1149 Type 2 diabetes mellitus with other diabetic neurological complication: Secondary | ICD-10-CM | POA: Diagnosis not present

## 2019-01-09 DIAGNOSIS — E785 Hyperlipidemia, unspecified: Secondary | ICD-10-CM | POA: Diagnosis present

## 2019-01-09 DIAGNOSIS — Z8349 Family history of other endocrine, nutritional and metabolic diseases: Secondary | ICD-10-CM | POA: Diagnosis not present

## 2019-01-09 DIAGNOSIS — R740 Nonspecific elevation of levels of transaminase and lactic acid dehydrogenase [LDH]: Secondary | ICD-10-CM | POA: Diagnosis not present

## 2019-01-09 DIAGNOSIS — R6510 Systemic inflammatory response syndrome (SIRS) of non-infectious origin without acute organ dysfunction: Secondary | ICD-10-CM | POA: Diagnosis present

## 2019-01-09 DIAGNOSIS — Z87440 Personal history of urinary (tract) infections: Secondary | ICD-10-CM | POA: Diagnosis not present

## 2019-01-09 DIAGNOSIS — Z86010 Personal history of colonic polyps: Secondary | ICD-10-CM | POA: Diagnosis not present

## 2019-01-09 LAB — BASIC METABOLIC PANEL
ANION GAP: 8 (ref 5–15)
BUN: 9 mg/dL (ref 8–23)
CO2: 27 mmol/L (ref 22–32)
Calcium: 8.3 mg/dL — ABNORMAL LOW (ref 8.9–10.3)
Chloride: 102 mmol/L (ref 98–111)
Creatinine, Ser: 0.73 mg/dL (ref 0.61–1.24)
GFR calc Af Amer: >60 mL/min (ref >60)
GFR calc non Af Amer: >60 mL/min (ref >60)
GLUCOSE: 199 mg/dL — AB (ref 70–99)
Potassium: 3.5 mmol/L (ref 3.5–5.1)
Sodium: 137 mmol/L (ref 135–145)

## 2019-01-09 LAB — GLUCOSE, CAPILLARY
GLUCOSE-CAPILLARY: 169 mg/dL — AB (ref 70–99)
GLUCOSE-CAPILLARY: 138 mg/dL — AB (ref 70–99)
Glucose-Capillary: 176 mg/dL — ABNORMAL HIGH (ref 70–99)
Glucose-Capillary: 169 mg/dL — ABNORMAL HIGH (ref 70–99)

## 2019-01-09 LAB — CBC WITH DIFFERENTIAL/PLATELET
Abs Immature Granulocytes: 0.03 10*3/uL (ref 0.00–0.07)
Basophils Absolute: 0 10*3/uL (ref 0.0–0.1)
Basophils Relative: 0 %
EOS ABS: 0.2 10*3/uL (ref 0.0–0.5)
Eosinophils Relative: 2 %
HCT: 35.2 % — ABNORMAL LOW (ref 39.0–52.0)
Hemoglobin: 11.5 g/dL — ABNORMAL LOW (ref 13.0–17.0)
IMMATURE GRANULOCYTES: 0 %
Lymphocytes Relative: 15 %
Lymphs Abs: 1.5 10*3/uL (ref 0.7–4.0)
MCH: 28.8 pg (ref 26.0–34.0)
MCHC: 32.7 g/dL (ref 30.0–36.0)
MCV: 88.2 fL (ref 80.0–100.0)
Monocytes Absolute: 1.1 10*3/uL — ABNORMAL HIGH (ref 0.1–1.0)
Monocytes Relative: 11 %
NEUTROS PCT: 72 %
Neutro Abs: 6.9 10*3/uL (ref 1.7–7.7)
Platelets: 151 10*3/uL (ref 150–400)
RBC: 3.99 MIL/uL — ABNORMAL LOW (ref 4.22–5.81)
RDW: 12.2 % (ref 11.5–15.5)
WBC: 9.7 10*3/uL (ref 4.0–10.5)
nRBC: 0 % (ref 0.0–0.2)

## 2019-01-09 LAB — CK: Total CK: 2103 U/L — ABNORMAL HIGH (ref 49–397)

## 2019-01-09 NOTE — Progress Notes (Signed)
Pt oral temp elevated to 100.6 Hospitalist notified.

## 2019-01-09 NOTE — Progress Notes (Signed)
I called this morning to check in on Leon Nelson. Spoke to him and his daughter. CK is much improved. He feels weak. He has pain to the foot but they have not been icing. I put in an order for ice. Encourage elevation.   Temp was 100. I think this is more in response to the rhabdo. Would encourage incentive spirometer. Recommend to check CBC. If fever continues will change bandage later today but likely early for postop infection. From a surgical standpoint I would like to do clindamycin as prophylaxis.   He would like to go to rehab upon discharge.   Celesta Gentile, DPM O: [phone] C: [phone]

## 2019-01-09 NOTE — Evaluation (Signed)
Physical Therapy Evaluation Patient Details Name: Leon Nelson MRN: [id number] DOB: 01-Jan-1956 Today's Date: 01/09/2019   History of Present Illness  Patient is a 63 y/o male who presents with right buttock pain which started after a 4 hour surgery on his ankle. 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 PMH includes HTN, HLD, DM, depression.  Clinical Impression  Patient presents with left ankle/foot pain s/p surgery, new NWB LLE status, weakness and impaired mobility s/p above. Tolerated standing with a heavy Mod A and cues to maintain NWB status. For the most part, able to do this however demonstrates poor standing tolerance due to weakness. Given suppository this AM and needs to have BM after standing so returned to supine for bed pain. Transfer deferred. BSC inadequate for pt's size so RN ordering a bariatric BSC. Pt lives with wife and 2 daughters and independent PTA. Has 5 stairs to enter home. Would benefit from SNF to maximize independence and mobility prior to return home. Pt and wife agreeable. Will follow acutely.     Follow Up Recommendations SNF;Supervision for mobility/OOB    Equipment Recommendations  Rolling walker with 5" wheels;Wheelchair (measurements PT);Wheelchair cushion (measurements PT);3in1 (PT)(bariatric wide BSC)    Recommendations for Other Services       Precautions / Restrictions Precautions Precautions: Fall Restrictions Weight Bearing Restrictions: Yes LLE Weight Bearing: Non weight bearing      Mobility  Bed Mobility Overal bed mobility: Needs Assistance Bed Mobility: Rolling;Sidelying to Sit Rolling: Min guard Sidelying to sit: Min guard;HOB elevated       General bed mobility comments: Cues to reach for rail, increased effort to get to EOB; + dizziness.   Transfers Overall transfer level: Needs assistance Equipment used: Rolling walker (2 wheeled) Transfers: Sit to/from Stand Sit to Stand: From elevated surface;Mod assist         General  transfer comment: Heavy Mod A to power to standing with cues for hand placement/technique. Able to maintain NWB for the most part with standing; difficult to maitnain upright due to weakness. Pt given suppository this AM and needing to use bathroom. BSC too small so returned to bed to use bedpan.  Ambulation/Gait             General Gait Details: Unable  Stairs            Wheelchair Mobility    Modified Rankin (Stroke Patients Only)       Balance Overall balance assessment: Needs assistance Sitting-balance support: Feet supported;Single extremity supported Sitting balance-Leahy Scale: Fair Sitting balance - Comments: Posterior LOB x1 with AROM BLEs requiring UE support.    Standing balance support: During functional activity Standing balance-Leahy Scale: Poor Standing balance comment: Reliant on BUEs and Min A for static standing balance due to NWB status LLE. Limited standing tolerance due to weakness.                              Pertinent Vitals/Pain Pain Assessment: 0-10 Pain Score: 4  Pain Location: left ankle Pain Descriptors / Indicators: Sore;Throbbing;Operative site guarding Pain Intervention(s): Monitored during session;Repositioned    Home Living Family/patient expects to be discharged to:: Skilled nursing facility Living Arrangements: Spouse/significant other Available Help at Discharge: Family;Available PRN/intermittently(wife goes back to work on Tuesday and daughters on Wed) Type of Home: House Home Access: Stairs to enter Entrance Stairs-Rails: Right;Left;Can reach both Technical brewer of Steps: 5 Home Layout: One level Home Equipment: Crutches;Other (comment) Additional Comments: knee  scooter was ordered post ankle surgery    Prior Function Level of Independence: Independent         Comments: 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     Hand Dominance        Extremity/Trunk Assessment   Upper  Extremity Assessment Upper Extremity Assessment: Defer to OT evaluation    Lower Extremity Assessment Lower Extremity Assessment: LLE deficits/detail;RLE deficits/detail RLE Deficits / Details: Grossly ~3+/5 throughout  LLE Deficits / Details: Bandage clean, dry and intact. Able to lift LLE off bed with effort but not hold.       Communication   Communication: No difficulties  Cognition Arousal/Alertness: Awake/alert Behavior During Therapy: WFL for tasks assessed/performed Overall Cognitive Status: Within Functional Limits for tasks assessed                                        General Comments General comments (skin integrity, edema, etc.): Wife and 2 daughters present.    Exercises     Assessment/Plan    PT Assessment Patient needs continued PT services  PT Problem List Decreased strength;Decreased balance;Pain;Obesity;Decreased mobility;Decreased skin integrity       PT Treatment Interventions Balance training;Patient/family education;Therapeutic activities;Gait training;Wheelchair mobility training;Therapeutic exercise;DME instruction;Functional mobility training    PT Goals (Current goals can be found in the Care Plan section)  Acute Rehab PT Goals Patient Stated Goal: to get stronger PT Goal Formulation: With patient Time For Goal Achievement: 01/23/19 Potential to Achieve Goals: Good    Frequency Min 3X/week   Barriers to discharge Decreased caregiver support;Inaccessible home environment stairs to enter home and wife going back to work    Co-evaluation               AM-PAC PT "6 Clicks" Mobility  Outcome Measure Help needed turning from your back to your side while in a flat bed without using bedrails?: A Little Help needed moving from lying on your back to sitting on the side of a flat bed without using bedrails?: A Little Help needed moving to and from a bed to a chair (including a wheelchair)?: A Lot Help needed standing up  from a chair using your arms (e.g., wheelchair or bedside chair)?: A Lot Help needed to walk in hospital room?: Total Help needed climbing 3-5 steps with a railing? : Total 6 Click Score: 12    End of Session Equipment Utilized During Treatment: Gait belt Activity Tolerance: Patient tolerated treatment well Patient left: in bed;with call bell/phone within reach;with nursing/sitter in room;with family/visitor present(on bed pan) Nurse Communication: Mobility status PT Visit Diagnosis: Muscle weakness (generalized) (M62.81);Difficulty in walking, not elsewhere classified (R26.2);Pain Pain - Right/Left: Left Pain - part of body: Ankle and joints of foot    Time: 1354-1417 PT Time Calculation (min) (ACUTE ONLY): 23 min   Charges:   PT Evaluation $PT Eval Moderate Complexity: 1 Mod PT Treatments $Therapeutic Activity: 8-22 mins        Wray Kearns, PT, DPT Acute Rehabilitation Services Pager [phone] Office [phone]      Marguarite Arbour A Sabra Heck 01/09/2019, 2:41 PM

## 2019-01-09 NOTE — Progress Notes (Signed)
PROGRESS NOTE  Leon Nelson LGX:[id number] DOB: 08-30-1956 DOA: 01/07/2019 PCP: Biagio Borg, MD  HPI/Recap of past 24 hours: Leon Nelson a 63 y.o.malewith medical history significant fortype 2 diabetes mellitus, hypertension, hypothyroidism, and chronic bilateral foot and ankle pain status post triple left foot arthrodesis in the outpatient surgery center today, now presenting to the emergency department with severe pain in the right buttock. Patient developed severe right buttock pain after his surgery, was treated with morphine, Valium, and Percocet at the surgery center, improved enough to go home, and had severe worsening in his pain at home, prompting his presentation to the ED. He denies any right buttock pain prior to the surgery and denies any history of similar pain. He reports some paresthesia in the proximal right leg at the outpatient surgery center, but that has since resolved. He was unable to bear weight on the right leg, but this was secondary to pain rather than weakness.   Presented with elevated CPK greater than 9000.  Admitted for rhabdomyolysis.  01/08/2019: Patient seen and examined with his daughters at bedside.  Pain is well controlled on current pain management.  CPK trending down from greater than 9000 to greater than 7000 this morning.  Continue with IV fluid hydration normal saline at 125 cc/h.  Repeat CPK in the morning.  Vital signs are stable.  Continue to monitor vital signs.  01/09/19: Seen and examined at bedside with his daughters present. Reports left foot pain. Has blister noted in dorsal portion of left foot. Febrile this am with no leukocytosis. Advised to use incentive spirometer to avoid atelectasis.  Assessment/Plan: Principal Problem:   Rhabdomyolysis Active Problems:   Hypothyroidism   Diabetes mellitus type 2 with neurological manifestations ([place])   Essential hypertension   SIRS (systemic inflammatory response syndrome) (HCC)  Elevated transaminase level  Rhabdomyolysis  - Presented with CPK greater than 9000 which continues to trend down CPK on 01/09/2019 greater than 2000 Continue IV fluid hydration 125 cc/h Repeat CPK in the morning No issues with renal function Repeat BMP in the morning  SIRS  - There was a transient fever in ED and leukocytosis noted  -Fever noted again today with T-max of 100.6 with no leukocytosis Advised to use incentive spirometer at bedside to avoid atelectasis Obtain blood cultures x2 Continue to closely monitor for any changes Monitor fever curve and WBC Repeat CBC in the morning  Type II DM  with hyperglycemia - A1c was 6.7% in November  - Managed at home with metformin and glipizide, held on admission  - Continue insulin sliding scale  Hypertension  - Blood pressure at goal  -Continue to hold atenolol and chlorthalidone  Hypothyroidism  - Continue Synthroid    Elevated transaminases  - Mild elevation in transaminases noted with normal t bili, benign exam, levels were normal in November 2019  - Likely secondary to rhabdomyolysis, will trend    Status-post left foot surgery on 01/07/19  - Underwent triple arthrodesis left foot 01/07/19  -Blisters noted on exam on dorsum portion of left foot -Continue postop management    DVT prophylaxis: SQ Lovenox daily  Code Status: Full  Family Communication: Daughters at bedside Consults called: podiatry Disposition:  SNF when podiatry signs off.     Objective: Vitals:   01/09/19 0657 01/09/19 0909 01/09/19 1254 01/09/19 1500  BP:    137/73  Pulse:    82  Resp:    18  Temp: 100 F (37.8 C) (!) 100.6  F (38.1 C) 99.4 F (37.4 C) 99.5 F (37.5 C)  TempSrc: Oral Oral Oral Oral  SpO2:    96%  Weight:      Height:        Intake/Output Summary (Last 24 hours) at 01/09/2019 1638 Last data filed at 01/09/2019 1637 Gross per 24 hour  Intake 1196.22 ml  Output 3500 ml  Net -2303.78 ml   Filed Weights    01/08/19 0145  Weight: (!) 139.3 kg    Exam:  . General: 63 y.o. year-old male well developed well nourished in no acute distress.  Alert and oriented x3. . Cardiovascular: Regular rate and rhythm with no rubs or gallops.  No thyromegaly or JVD noted.   Marland Kitchen Respiratory: Mild expiratory wheezes and mild rales at bases. Good inspiratory effort. . Abdomen: Soft nontender nondistended with normal bowel sounds x4 quadrants. . Musculoskeletal: Trace lower extremity edema.  Blisters noted on dorsal portion of left foot  . skin: Blisters as noted above . Psychiatry: Mood is appropriate for condition and setting   Data Reviewed: CBC: Recent Labs  Lab 01/07/19 2301 01/08/19 0219 01/09/19 1051  WBC 13.9* 13.1* 9.7  NEUTROABS 10.6* 9.0* 6.9  HGB 13.3 12.6* 11.5*  HCT 40.6 37.8* 35.2*  MCV 88.1 88.7 88.2  PLT 226 240 332   Basic Metabolic Panel: Recent Labs  Lab 01/07/19 2301 01/08/19 0219 01/09/19 1051  NA 136 136 137  K 4.2 4.0 3.5  CL 99 100 102  CO2 25 24 27   GLUCOSE 156* 167* 199*  BUN 24* 24* 9  CREATININE 0.95 1.05 0.73  CALCIUM 8.7* 8.5* 8.3*   GFR: Estimated Creatinine Clearance: 135 mL/min (by C-G formula based on SCr of 0.73 mg/dL). Liver Function Tests: Recent Labs  Lab 01/07/19 2301 01/08/19 0219  AST 142* 134*  ALT 62* 58*  ALKPHOS 35* 34*  BILITOT 0.6 0.7  PROT 6.6 6.1*  ALBUMIN 3.5 3.3*   No results for input(s): LIPASE, AMYLASE in the last 168 hours. No results for input(s): AMMONIA in the last 168 hours. Coagulation Profile: No results for input(s): INR, PROTIME in the last 168 hours. Cardiac Enzymes: Recent Labs  Lab 01/07/19 2301 01/08/19 0219 01/09/19 0547  CKTOTAL 9,518* 7,678* 2,103*   BNP (last 3 results) No results for input(s): PROBNP in the last 8760 hours. HbA1C: No results for input(s): HGBA1C in the last 72 hours. CBG: Recent Labs  Lab 01/08/19 1616 01/08/19 2104 01/09/19 0604 01/09/19 1116 01/09/19 1615  GLUCAP 174*  161* 169* 176* 169*   Lipid Profile: No results for input(s): CHOL, HDL, LDLCALC, TRIG, CHOLHDL, LDLDIRECT in the last 72 hours. Thyroid Function Tests: No results for input(s): TSH, T4TOTAL, FREET4, T3FREE, THYROIDAB in the last 72 hours. Anemia Panel: No results for input(s): VITAMINB12, FOLATE, FERRITIN, TIBC, IRON, RETICCTPCT in the last 72 hours. Urine analysis:    Component Value Date/Time   COLORURINE YELLOW 01/07/2019 2331   APPEARANCEUR HAZY (A) 01/07/2019 2331   LABSPEC 1.018 01/07/2019 2331   PHURINE 5.0 01/07/2019 2331   GLUCOSEU NEGATIVE 01/07/2019 2331   GLUCOSEU NEGATIVE 10/27/2018 0838   HGBUR MODERATE (A) 01/07/2019 2331   BILIRUBINUR NEGATIVE 01/07/2019 2331   KETONESUR NEGATIVE 01/07/2019 2331   PROTEINUR NEGATIVE 01/07/2019 2331   UROBILINOGEN 0.2 10/27/2018 0838   NITRITE NEGATIVE 01/07/2019 2331   LEUKOCYTESUR NEGATIVE 01/07/2019 2331   Sepsis Labs: @LABRCNTIP (procalcitonin:4,lacticidven:4)  )No results found for this or any previous visit (from the past 240 hour(s)).    Studies:  No results found.  Scheduled Meds: . allopurinol  300 mg Oral Daily  . enoxaparin (LOVENOX) injection  70 mg Subcutaneous Q24H  . gabapentin  300 mg Oral QHS  . insulin aspart  0-5 Units Subcutaneous QHS  . insulin aspart  0-9 Units Subcutaneous TID WC  . levothyroxine  50 mcg Oral Q0600  . venlafaxine XR  75 mg Oral Daily    Continuous Infusions: . sodium chloride 125 mL/hr at 01/09/19 0105     LOS: 0 days     Kayleen Memos, MD [facility] Pager [phone]  If 7PM-7AM, please contact night-coverage [email or web address] Password [facility] 01/09/2019, 4:38 PM

## 2019-01-09 NOTE — Progress Notes (Signed)
I came back by to check on Leon Nelson.  He states that the right hip, buttock pain is down to 1/10 but overall he does have some muscle aches.  He states the block is completely worn off on the left foot.  He gets occasional sharp pain at the top of his foot.  He is getting pain medicine was helpful.  There is no clinical evaluation all compartments appear to be soft to bilateral lower extremities.  I did noticed a single blister present on the left foot just proximal to the MPJs.  I did remove the posterior splint and the calf appeared to be supple without any pain.  I did not remove the entire inner bandage but there was some mild amount of bloody dried drainage on both the medial lateral aspects over the incision site but there is no fresh blood.  I did not remove the inner bandage.  I was able to palpate a dorsalis pedis pulse.  He is able to move his toes.  There is an immediate capillary refill time to all the digits.  There is a mild to moderate swelling to the dorsal left midfoot however the foot was soft.  No other blisters that I could see. I reapplied the bandages in the posterior splint.  Encouraged ice elevation.  He has not been icing ice packs were applied tonight.  Continue IV hydration and will follow labs in the morning.   After discussion and because he is nonweightbearing the left side is having difficulty with his right leg he would like to consider going to rehab instead of home.  He states that physical therapy is come by to evaluate him.   He has my cell phone number and office number and encouraged to call if any questions or concerns.   Denies any fevers, chills, nausea, vomiting. No chest pain, shortness of breath.   Celesta Gentile, DPM O: [phone] C: [phone]

## 2019-01-09 NOTE — Progress Notes (Addendum)
Subjective: [name] is POD #2 s/p left foot triple arthrodesis.  Postoperatively he started developed right hip pain that was treated with morphine, Percocet and he continued have pain and therefore was given 5 mg of Valium.  His pain improved and he is able to move his right leg with significant improvement in symptoms.  He was discharged from outpatient surgery center as he was doing better and vital signs were stable.  On his way home he went to pick up his prescriptions and he started having worsening right hip pain.  Because of this he was directed to go to the emergency room.  He was admitted to the hospital overnight and was found to be in rhabdomyolysis.  Kidney function has been preserved however liver enzymes are elevated.  White blood cell count also elevated. Had a fever on admission but improved.  He states that the pain to the leg is much improved and he has not asked for pain medication since around midnight. He states that he is doing much better overall. PT did see him today and recommended SNF.   After seeing him and talking to his wife she reports that he did have a cold last week.   Currently denies any fever, chills, nausea, vomiting. No chest pain, shortness of breath.  Objective: AAO x3, NAD- daughters are at bedside.  DP/PT pulses palpable , CRT less than 3 seconds Posterior splint and bandages are clean, dry, intact to the left lower extremity.  Today I did change the entire bandage.  There was a large serous filled blister on the dorsal forefoot.  Upon removal of the bandage was no further blisters identified.  Both incisions appear to be well coapted with staples intact.  There was some mild to moderate swelling but the foot is supple and there is decreased tenderness along the area.  Palpable dorsalis pedis pulse is evident.  There is some minimal erythema but there is no increase in warmth.  It is erythema is more from inflammation and swelling as opposed to any kind of  infection.  He is able to move his toes today with significant improvement in tenderness.  There is no pain with calf compression, swelling, warmth.  All the compartments appear to be soft.  Clinically he looks much better and he feels better as well. No open lesions or pre-ulcerative lesions.  No pain with calf compression, swelling, warmth, erythema  Assessment: Postop day #2 status post left foot triple arthrodesis; rhabdomyolysis  Plan: -All treatment options discussed with the patient including all alternatives, risks, complications.  -Bandage was changed today.  He denies pain over the incisions followed by Xeroform and a dry sterile dressing.  An Ace bandage was applied.  A posterior splint was also applied. -Prior to dressing application I did clean the blister with Betadine I did drain this only clear blister was expressed but there is no purulence. -I do recommend 3 doses of Ancef while inpatient as a prophylaxis. -Encourage incentive spirometer. -Nonweightbearing left lower extremity.  Likely SNF placement -White blood cell count is normal and fever is coming down.  Clinically he is doing better.  We will continue to monitor.  He and his wife have no further questions or concerns today  Leon Nelson, DPM O: [phone] C: [phone]

## 2019-01-09 NOTE — Progress Notes (Signed)
Pt has 2 fluid filled blisters on the top of his l foot and it is swollen and tight

## 2019-01-09 NOTE — Telephone Encounter (Signed)
Nurse called from [facility] to update Dr. Jacqualyn Posey

## 2019-01-09 NOTE — Telephone Encounter (Signed)
Leon Nelson states she reacted out to Dr. Jacqualyn Posey and he states he will put in orders.

## 2019-01-09 NOTE — Plan of Care (Signed)
  Problem: Clinical Measurements: Goal: Ability to maintain clinical measurements within normal limits will improve Outcome: Progressing   Problem: Elimination: Goal: Will not experience complications related to urinary retention Outcome: Progressing

## 2019-01-10 LAB — CBC WITH DIFFERENTIAL/PLATELET
Abs Immature Granulocytes: 0.03 10*3/uL (ref 0.00–0.07)
Basophils Absolute: 0 10*3/uL (ref 0.0–0.1)
Basophils Relative: 0 %
Eosinophils Absolute: 0.3 10*3/uL (ref 0.0–0.5)
Eosinophils Relative: 3 %
HCT: 34.9 % — ABNORMAL LOW (ref 39.0–52.0)
Hemoglobin: 11.5 g/dL — ABNORMAL LOW (ref 13.0–17.0)
Immature Granulocytes: 0 %
LYMPHS ABS: 1.9 10*3/uL (ref 0.7–4.0)
Lymphocytes Relative: 23 %
MCH: 29 pg (ref 26.0–34.0)
MCHC: 33 g/dL (ref 30.0–36.0)
MCV: 88.1 fL (ref 80.0–100.0)
Monocytes Absolute: 1 10*3/uL (ref 0.1–1.0)
Monocytes Relative: 12 %
Neutro Abs: 5.1 10*3/uL (ref 1.7–7.7)
Neutrophils Relative %: 62 %
Platelets: 151 10*3/uL (ref 150–400)
RBC: 3.96 MIL/uL — ABNORMAL LOW (ref 4.22–5.81)
RDW: 12.2 % (ref 11.5–15.5)
WBC: 8.3 10*3/uL (ref 4.0–10.5)
nRBC: 0 % (ref 0.0–0.2)

## 2019-01-10 LAB — GLUCOSE, CAPILLARY
GLUCOSE-CAPILLARY: 204 mg/dL — AB (ref 70–99)
Glucose-Capillary: 175 mg/dL — ABNORMAL HIGH (ref 70–99)
Glucose-Capillary: 165 mg/dL — ABNORMAL HIGH (ref 70–99)
Glucose-Capillary: 180 mg/dL — ABNORMAL HIGH (ref 70–99)

## 2019-01-10 LAB — BASIC METABOLIC PANEL
Anion gap: 9 (ref 5–15)
BUN: 8 mg/dL (ref 8–23)
CO2: 27 mmol/L (ref 22–32)
Calcium: 8.2 mg/dL — ABNORMAL LOW (ref 8.9–10.3)
Chloride: 103 mmol/L (ref 98–111)
Creatinine, Ser: 0.51 mg/dL — ABNORMAL LOW (ref 0.61–1.24)
GFR calc non Af Amer: >60 mL/min (ref >60)
Glucose, Bld: 195 mg/dL — ABNORMAL HIGH (ref 70–99)
Potassium: 3.5 mmol/L (ref 3.5–5.1)
Sodium: 139 mmol/L (ref 135–145)

## 2019-01-10 LAB — CK: Total CK: 1230 U/L — ABNORMAL HIGH (ref 49–397)

## 2019-01-10 MED ORDER — POLYETHYLENE GLYCOL 3350 17 G PO PACK
17.0000 g | PACK | Freq: Every day | ORAL | Status: DC
  Administered 2019-01-10 – 2019-01-12 (×2): 17 g via ORAL
  Filled 2019-01-10 (×5): qty 1

## 2019-01-10 MED ORDER — SENNOSIDES-DOCUSATE SODIUM 8.6-50 MG PO TABS
2.0000 | ORAL_TABLET | Freq: Two times a day (BID) | ORAL | Status: DC
  Administered 2019-01-10 – 2019-01-12 (×4): 2 via ORAL
  Filled 2019-01-10 (×7): qty 2

## 2019-01-10 NOTE — Plan of Care (Signed)
  Problem: Education: Goal: Knowledge of General Education information will improve Description Including pain rating scale, medication(s)/side effects and non-pharmacologic comfort measures Outcome: Progressing   Problem: Clinical Measurements: Goal: Will remain free from infection Outcome: Progressing   Problem: Clinical Measurements: Goal: Cardiovascular complication will be avoided Outcome: Progressing   Problem: Activity: Goal: Risk for activity intolerance will decrease Outcome: Progressing   Problem: Elimination: Goal: Will not experience complications related to urinary retention Outcome: Progressing   Problem: Pain Managment: Goal: General experience of comfort will improve Outcome: Progressing

## 2019-01-10 NOTE — Plan of Care (Signed)
  Problem: Education: Goal: Knowledge of General Education information will improve Description: Including pain rating scale, medication(s)/side effects and non-pharmacologic comfort measures Outcome: Progressing   Problem: Health Behavior/Discharge Planning: Goal: Ability to manage health-related needs will improve Outcome: Progressing   Problem: Activity: Goal: Risk for activity intolerance will decrease Outcome: Progressing   Problem: Elimination: Goal: Will not experience complications related to urinary retention Outcome: Progressing   Problem: Pain Managment: Goal: General experience of comfort will improve Outcome: Progressing   

## 2019-01-10 NOTE — Progress Notes (Signed)
PROGRESS NOTE  LOC FEINSTEIN WYO:[id number] DOB: 1956-04-07 DOA: 01/07/2019 PCP: Biagio Borg, MD  HPI/Recap of past 24 hours: Leon Nelson a 63 y.o.malewith medical history significant fortype 2 diabetes mellitus, hypertension, hypothyroidism, and chronic bilateral foot and ankle pain status post triple left foot arthrodesis in the outpatient surgery center today, now presenting to the emergency department with severe pain in the right buttock. Patient developed severe right buttock pain after his surgery, was treated with morphine, Valium, and Percocet at the surgery center, improved enough to go home, and had severe worsening in his pain at home, prompting his presentation to the ED. He denies any right buttock pain prior to the surgery and denies any history of similar pain. He reports some paresthesia in the proximal right leg at the outpatient surgery center, but that has since resolved. He was unable to bear weight on the right leg, but this was secondary to pain rather than weakness.   Presented with elevated CPK greater than 9000.  Admitted for rhabdomyolysis.  01/08/2019: Patient seen and examined with his daughters at bedside.  Pain is well controlled on current pain management.  CPK trending down from greater than 9000 to greater than 7000 this morning.  Continue with IV fluid hydration normal saline at 125 cc/h.  Repeat CPK in the morning.  Vital signs are stable.  Continue to monitor vital signs.  01/09/19: Seen and examined at bedside with his daughters present. Reports left foot pain. Has blister noted in dorsal portion of left foot. Febrile this am with no leukocytosis. Advised to use incentive spirometer to avoid atelectasis.  01/10/19: Reports left lower extremity pain is better controlled.  Has been reluctant to take pain medication due to previous experience of opiate-induced constipation.  Denies chest pain, dyspnea or palpitations.   Assessment/Plan: Principal  Problem:   Rhabdomyolysis Active Problems:   Hypothyroidism   Diabetes mellitus type 2 with neurological manifestations ([place])   Essential hypertension   SIRS (systemic inflammatory response syndrome) (HCC)   Elevated transaminase level  Rhabdomyolysis, improving  - Presented with CPK greater than 9000 which continues to trend down CPK on 01/10/2019 1200 from 01/09/2019 greater than 2000 Continue IV fluid hydration 100 cc/h Repeat CPK in the morning Renal function, no issues Repeat BMP in the morning Monitor urine output  SIRS  Fever noted overnight with T-max of 100.6 No leukocytosis with WBC trending down from [place] to 8K Patient instructed to continue to use incentive spirometer to avoid atelectasis Blood cultures negative to date  Type II DM  with hyperglycemia - A1c was 6.7% in November  - Managed at home with metformin and glipizide, held on admission  - Continue insulin sliding scale  Hypertension  - Blood pressure at goal  -Continue to hold atenolol and chlorthalidone  Hypothyroidism  - Continue Synthroid    Elevated transaminases  - Mild elevation in transaminases noted with normal t bili, benign exam, levels were normal in November 2019  - Likely secondary to rhabdomyolysis, will trend    Status-post left foot surgery on 01/07/19  - Underwent triple arthrodesis left foot 01/07/19  -Blisters noted on exam on dorsum portion of left foot -Continue postop management   Ambulatory dysfunction post surgery PT assessed and recommended SNF CSW consulted to assist with SNF placement   DVT prophylaxis: SQ Lovenox daily  Code Status: Full  Family Communication: Daughters at bedside Consults called: podiatry Disposition:  SNF when podiatry signs off.     Objective:  Vitals:   01/10/19 0134 01/10/19 0456 01/10/19 0500 01/10/19 1509  BP:  138/82  140/79  Pulse:  73  76  Resp:  18  18  Temp: 98.1 F (36.7 C) 99.3 F (37.4 C)  99.1 F (37.3 C)  TempSrc:  Oral Oral  Oral  SpO2:  96%  96%  Weight:   (!) 143.5 kg   Height:        Intake/Output Summary (Last 24 hours) at 01/10/2019 1635 Last data filed at 01/10/2019 1300 Gross per 24 hour  Intake 720 ml  Output 3350 ml  Net -2630 ml   Filed Weights   01/08/19 0145 01/10/19 0500  Weight: (!) 139.3 kg (!) 143.5 kg    Exam:  . General: 63 y.o. year-old male developed well-nourished in no acute distress.  Alert and oriented x3. . Cardiovascular: Regular rate and rhythm with no rubs or gallops.  No JVD or thyromegaly noted.   Marland Kitchen Respiratory: Mild expiratory wheezes with good inspiratory effort. . Abdomen: Soft nontender nondistended with normal bowel sounds x4 quadrants. . Musculoskeletal: Trace lower extremity edema.  Left lower extremity is in surgical wrap. Marland Kitchen Psychiatry: Mood is appropriate for condition and setting   Data Reviewed: CBC: Recent Labs  Lab 01/07/19 2301 01/08/19 0219 01/09/19 1051 01/10/19 0445  WBC 13.9* 13.1* 9.7 8.3  NEUTROABS 10.6* 9.0* 6.9 5.1  HGB 13.3 12.6* 11.5* 11.5*  HCT 40.6 37.8* 35.2* 34.9*  MCV 88.1 88.7 88.2 88.1  PLT 226 240 151 315   Basic Metabolic Panel: Recent Labs  Lab 01/07/19 2301 01/08/19 0219 01/09/19 1051 01/10/19 0445  NA 136 136 137 139  K 4.2 4.0 3.5 3.5  CL 99 100 102 103  CO2 25 24 27 27   GLUCOSE 156* 167* 199* 195*  BUN 24* 24* 9 8  CREATININE 0.95 1.05 0.73 0.51*  CALCIUM 8.7* 8.5* 8.3* 8.2*   GFR: Estimated Creatinine Clearance: 137.2 mL/min (A) (by C-G formula based on SCr of 0.51 mg/dL (L)). Liver Function Tests: Recent Labs  Lab 01/07/19 2301 01/08/19 0219  AST 142* 134*  ALT 62* 58*  ALKPHOS 35* 34*  BILITOT 0.6 0.7  PROT 6.6 6.1*  ALBUMIN 3.5 3.3*   No results for input(s): LIPASE, AMYLASE in the last 168 hours. No results for input(s): AMMONIA in the last 168 hours. Coagulation Profile: No results for input(s): INR, PROTIME in the last 168 hours. Cardiac Enzymes: Recent Labs  Lab  01/07/19 2301 01/08/19 0219 01/09/19 0547 01/10/19 0445  CKTOTAL 1,761* 7,678* 2,103* 1,230*   BNP (last 3 results) No results for input(s): PROBNP in the last 8760 hours. HbA1C: No results for input(s): HGBA1C in the last 72 hours. CBG: Recent Labs  Lab 01/09/19 1116 01/09/19 1615 01/09/19 2101 01/10/19 0608 01/10/19 1035  GLUCAP 176* 169* 138* 175* 204*   Lipid Profile: No results for input(s): CHOL, HDL, LDLCALC, TRIG, CHOLHDL, LDLDIRECT in the last 72 hours. Thyroid Function Tests: No results for input(s): TSH, T4TOTAL, FREET4, T3FREE, THYROIDAB in the last 72 hours. Anemia Panel: No results for input(s): VITAMINB12, FOLATE, FERRITIN, TIBC, IRON, RETICCTPCT in the last 72 hours. Urine analysis:    Component Value Date/Time   COLORURINE YELLOW 01/07/2019 2331   APPEARANCEUR HAZY (A) 01/07/2019 2331   LABSPEC 1.018 01/07/2019 2331   PHURINE 5.0 01/07/2019 2331   GLUCOSEU NEGATIVE 01/07/2019 2331   GLUCOSEU NEGATIVE 10/27/2018 0838   HGBUR MODERATE (A) 01/07/2019 2331   BILIRUBINUR NEGATIVE 01/07/2019 2331   KETONESUR NEGATIVE 01/07/2019  [place] 01/07/2019 2331   UROBILINOGEN 0.2 10/27/2018 0838   NITRITE NEGATIVE 01/07/2019 2331   LEUKOCYTESUR NEGATIVE 01/07/2019 2331   Sepsis Labs: @LABRCNTIP (procalcitonin:4,lacticidven:4)  ) Recent Results (from the past 240 hour(s))  Culture, blood (routine x 2)     Status: None (Preliminary result)   Collection Time: 01/09/19  7:34 PM  Result Value Ref Range Status   Specimen Description BLOOD LEFT HAND  Final   Special Requests   Final    BOTTLES DRAWN AEROBIC ONLY Blood Culture adequate volume   Culture   Final    NO GROWTH < 24 HOURS Performed at [facility], [place] [address], [place], [place] [address]    Report Status PENDING  Incomplete  Culture, blood (routine x 2)     Status: None (Preliminary result)   Collection Time: 01/09/19  7:34 PM  Result Value Ref Range Status   Specimen  Description BLOOD RIGHT HAND  Final   Special Requests   Final    BOTTLES DRAWN AEROBIC ONLY Blood Culture results may not be optimal due to an inadequate volume of blood received in culture bottles   Culture   Final    NO GROWTH < 24 HOURS Performed at [facility], [place] [address] [place], [place] [address]    Report Status PENDING  Incomplete      Studies: No results found.  Scheduled Meds: . allopurinol  300 mg Oral Daily  . enoxaparin (LOVENOX) injection  70 mg Subcutaneous Q24H  . gabapentin  300 mg Oral QHS  . insulin aspart  0-5 Units Subcutaneous QHS  . insulin aspart  0-9 Units Subcutaneous TID WC  . levothyroxine  50 mcg Oral Q0600  . polyethylene glycol  17 g Oral Daily  . senna-docusate  2 tablet Oral BID  . venlafaxine XR  75 mg Oral Daily    Continuous Infusions: . sodium chloride 100 mL/hr at 01/10/19 8676     LOS: 1 day     Kayleen Memos, MD [facility] Pager [phone]  If 7PM-7AM, please contact night-coverage [email or web address] Password [facility] 01/10/2019, 4:35 PM

## 2019-01-10 NOTE — Progress Notes (Signed)
Subjective: Leon Nelson is POD #3 s/p left foot triple arthrodesis.  Postoperatively he started developed right hip pain that was treated with morphine, Percocet and he continued have pain and therefore was given 5 mg of Valium.  His pain improved and he is able to move his right leg with significant improvement in symptoms.  He was discharged from outpatient surgery center as he was doing better and vital signs were stable.  On his way home he went to pick up his prescriptions and he started having worsening right hip pain.  Because of this he was directed to go to the emergency room.  He was admitted to the hospital overnight and was found to be in rhabdomyolysis.  Kidney function has been preserved however liver enzymes are elevated.  White blood cell count also elevated. Had a fever on admission but improved.  PT did see him and recommended SNF.   He states that overall he is feeling well, just tired. He was asleep when I went to see him this morning.  He states he is tired because more the bed being uncomfortable and not being able to rest.  Overall the pain to his left foot is controlled currently and states it "feels OK".   Currently denies any fever, chills, nausea, vomiting. No chest pain, shortness of breath.  Objective:  AAO x3, NAD- was sleeping Posterior splint and bandages are clean, dry, intact to the left lower extremity.  He is able to move the toes without significant discomfort.  There is immediate cap refill time to all the digits.  Bandages clean, dry, intact with the posterior splint.  His foot is elevated currently.  All compartments appear to be soft. No pain with calf compression, swelling, warmth, erythema  Assessment: Postop day #3 status post left foot triple arthrodesis; rhabdomyolysis  Plan: -All treatment options discussed with the patient including all alternatives, risks, complications.  -Overall he states he is doing better his pain is much improved.  Lab work  reviewed with him this morning.  CK level coming down as well as white blood cell count normal.  He had a temperature overnight but again his temperature has come down since last night.  We will continue to monitor.  I will continue to follow him while he is inpatient however from a surgical standpoint he can go to SNF when ready and medically stable.  -Continue ice and elevate.   -Continue incentive spirometer.  Celesta Gentile, DPM O: [phone] C: [phone]

## 2019-01-10 NOTE — Evaluation (Signed)
Occupational Therapy Evaluation Patient Details Name: Leon Nelson MRN: [id number] DOB: 1956/07/05 Today's Date: 01/10/2019    History of Present Illness Patient is a 63 y/o male who presents with right buttock pain which started after a 4 hour surgery on his ankle. 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 PMH includes HTN, HLD, DM, depression.   Clinical Impression   PTA, pt was independent with ADL and functional mobility. He reports that he has had decreased activity over the past month and this has increased his generalized weakness. Pt currently limited by significant upper body weakness and unable to power up with maximum assistance using RW today. He was able to complete sit<>stand with mod assist using Stedy frame (allowing him to pull up) with assistance for maintaining NWB LLE. He requires max-total assist +2 for LB ADL and toileting tasks at this time. Provided HEP with written instructions and Theraband for bilateral upper extremity strengthening and he demonstrates understanding. Pt would benefit from SNF placement post-acute D/C to maximize return to independence. OT will continue to follow while admitted.   Follow Up Recommendations  SNF;Supervision/Assistance - 24 hour    Equipment Recommendations  3 in 1 bedside commode(Bariatric BSC)    Recommendations for Other Services       Precautions / Restrictions Precautions Precautions: Fall Restrictions Weight Bearing Restrictions: No LLE Weight Bearing: Non weight bearing      Mobility Bed Mobility Overal bed mobility: Needs Assistance Bed Mobility: Rolling;Sidelying to Sit;Sit to Supine Rolling: Min guard Sidelying to sit: Min guard;HOB elevated   Sit to supine: Min guard   General bed mobility comments: Guarding assist for safety. Minimal dizziness (reports this is better than yesterday).  Transfers Overall transfer level: Needs assistance Equipment used: (STEDY) Transfers: Sit to/from Stand Sit to Stand: From  elevated surface;Mod assist         General transfer comment: Max assist and unable to clear hips during sit<>stand with RW. Requiring Stedy frame for UB pulling assist and able to come to standing with mod assist although difficult to maintain NWB status (OT placed foot under his in order to prevent this). Once standing, needing to urinate and returned to EOB.     Balance Overall balance assessment: Needs assistance Sitting-balance support: Feet supported;Single extremity supported Sitting balance-Leahy Scale: Good Sitting balance - Comments: Able to complete HEP at EOB without LOB with general supervision. Wife able to provide this.    Standing balance support: During functional activity Standing balance-Leahy Scale: Poor Standing balance comment: Reliant on BUE support as well as assistance.                            ADL either performed or assessed with clinical judgement   ADL Overall ADL's : Needs assistance/impaired Eating/Feeding: Set up;Sitting   Grooming: Set up;Sitting   Upper Body Bathing: Supervision/ safety;Sitting Upper Body Bathing Details (indicate cue type and reason): supervision for balance at EOB Lower Body Bathing: Maximal assistance;Sit to/from stand;+2 for physical assistance   Upper Body Dressing : Supervision/safety;Sitting   Lower Body Dressing: Maximal assistance;+2 for physical assistance;Sit to/from Health and safety inspector Details (indicate cue type and reason): Completed sit<>stand with Stedy frame with mod assist with difficulty maintaining NWB statues.  Toileting- Clothing Manipulation and Hygiene: Total assistance;Sit to/from stand         General ADL Comments: Pt with significantly limited mobility at this time due to generalized weakness and difficulty compensating for LLE  NWB with BUE. Provided HEP 3x10 reps of BUE strengthening exercises 3x/day to address this. He benefits from Romeo frame with assistance to maintain NWB      Vision Patient Visual Report: No change from baseline Vision Assessment?: No apparent visual deficits     Perception     Praxis      Pertinent Vitals/Pain Pain Assessment: Faces Faces Pain Scale: Hurts little more Pain Location: left ankle Pain Descriptors / Indicators: Sore;Throbbing;Operative site guarding Pain Intervention(s): Limited activity within patient's tolerance;Monitored during session;Repositioned     Hand Dominance     Extremity/Trunk Assessment Upper Extremity Assessment Upper Extremity Assessment: Generalized weakness;RUE deficits/detail;LUE deficits/detail RUE Deficits / Details: Grossly 3+/5 strength LUE Deficits / Details: Grossly 3+/5 strength   Lower Extremity Assessment Lower Extremity Assessment: Defer to PT evaluation       Communication Communication Communication: No difficulties   Cognition Arousal/Alertness: Awake/alert Behavior During Therapy: WFL for tasks assessed/performed Overall Cognitive Status: Within Functional Limits for tasks assessed                                     General Comments  Wife and 2 daughters present.     Exercises     Shoulder Instructions      Home Living Family/patient expects to be discharged to:: Skilled nursing facility Living Arrangements: Spouse/significant other Available Help at Discharge: Family;Available PRN/intermittently(wife goes back to work on Tuesday and daughters on Wed) Type of Home: House Home Access: Stairs to enter [employer] of Steps: 5 Entrance Stairs-Rails: Right;Left;Can reach both Home Layout: One level     Bathroom Shower/Tub: Aeronautical engineer: Crutches;Other (comment)   Additional Comments: knee scooter was ordered post ankle surgery      Prior Functioning/Environment Level of Independence: Independent        Comments: Stopped working in December due to foot pain; does his own ADL but has been more sedentary  and thinks this is likely the source of his weakness        OT Problem List: Decreased strength;Decreased range of motion;Decreased activity tolerance;Impaired balance (sitting and/or standing);Decreased safety awareness;Decreased knowledge of use of DME or AE;Decreased knowledge of precautions;Pain      OT Treatment/Interventions: Self-care/ADL training;Therapeutic exercise;Energy conservation;DME and/or AE instruction;Therapeutic activities;Patient/family education;Balance training    OT Goals(Current goals can be found in the care plan section) Acute Rehab OT Goals Patient Stated Goal: to get stronger OT Goal Formulation: With patient/family Time For Goal Achievement: 01/24/19 Potential to Achieve Goals: Good  OT Frequency: Min 2X/week   Barriers to D/C:            Co-evaluation              AM-PAC OT "6 Clicks" Daily Activity     Outcome Measure Help from another person eating meals?: None Help from another person taking care of personal grooming?: A Little Help from another person toileting, which includes using toliet, bedpan, or urinal?: Total Help from another person bathing (including washing, rinsing, drying)?: A Lot Help from another person to put on and taking off regular upper body clothing?: None Help from another person to put on and taking off regular lower body clothing?: A Lot 6 Click Score: 16   End of Session Equipment Utilized During Treatment: Gait belt;Rolling walker(Stedy) Nurse Communication: Mobility status  Activity Tolerance: Patient tolerated treatment well Patient left:  in bed;with call bell/phone within reach(family leaving for breakfast)  OT Visit Diagnosis: Other abnormalities of gait and mobility (R26.89);Pain;Muscle weakness (generalized) (M62.81) Pain - Right/Left: Left Pain - part of body: Ankle and joints of foot                Time: 1000-1035 OT Time Calculation (min): 35 min Charges:  OT General Charges $OT Visit: 1 Visit OT  Evaluation $OT Eval Moderate Complexity: 1 Mod OT Treatments $Therapeutic Exercise: 8-22 mins  Belva Agee, OTR/L [place] A Loel Betancur 01/10/2019, 11:00 AM

## 2019-01-10 NOTE — NC FL2 (Signed)
[place] LEVEL OF CARE SCREENING TOOL     IDENTIFICATION  Patient Name: Leon Nelson Birthdate: 12-04-1956 Sex: male Admission Date (Current Location): 01/07/2019  [facility] and [place] Number:  Herbalist and Address:  [facility] [place], [place] [address] [place], [place] [address]      Provider Number: [id number]  Attending Physician Name and Address:  Kayleen Memos, DO  Relative Name and Phone Number:  Anderson Malta (spouse) [phone]    Current Level of Care: Hospital Recommended Level of Care:   Prior Approval Number:    Date Approved/Denied:   PASRR Number: [id number]  Discharge Plan: SNF    Current Diagnoses: Patient Active Problem List   Diagnosis Date Noted  . Rhabdomyolysis 01/08/2019  . SIRS (systemic inflammatory response syndrome) ([place]) 01/08/2019  . Elevated transaminase level 01/08/2019  . Chronic pain of left ankle 11/01/2018  . Acute gouty arthritis 04/17/2018  . Left cervical radiculopathy 01/27/2018  . Bilateral shoulder pain 01/27/2018  . Left hand paresthesia 01/27/2018  . Hypokalemia 03/27/2017  . Posterior neck pain 09/13/2015  . Heart murmur previously undiagnosed 08/14/2011  . Preventative health care 08/12/2011  . CHEST PAIN 02/06/2011  . ABDOMINAL PAIN OTHER SPECIFIED SITE 07/20/2008  . KNEE PAIN, RIGHT, ACUTE 07/01/2008  . Hypothyroidism 05/26/2008  . Diabetes mellitus type 2 with neurological manifestations ([place]) 05/26/2008  . Hyperlipidemia 05/26/2008  . Depression 05/26/2008  . Essential hypertension 05/26/2008  . History of colonic polyps 05/26/2008  . UTI'S, HX OF 05/26/2008  . Personal history of urinary calculi 05/26/2008    Orientation RESPIRATION BLADDER Height & Weight     Self, Time, Situation, Place  Normal Continent Weight: (!) 143.5 kg Height:  6' (182.9 cm)  BEHAVIORAL SYMPTOMS/MOOD NEUROLOGICAL BOWEL NUTRITION STATUS      Continent Diet(see discharge summary)   AMBULATORY STATUS COMMUNICATION OF NEEDS Skin   Extensive Assist Verbally Surgical wounds(left ankle closed surgical incision)                       Personal Care Assistance Level of Assistance  Bathing, Feeding, Dressing, Total care Bathing Assistance: Maximum assistance Feeding assistance: Independent Dressing Assistance: Maximum assistance Total Care Assistance: Maximum assistance   Functional Limitations Info  Sight, Hearing, Speech Sight Info: Adequate Hearing Info: Adequate Speech Info: Adequate    SPECIAL CARE FACTORS FREQUENCY  PT (By licensed PT), OT (By licensed OT)     PT Frequency: min 5x weekly OT Frequency: min 5x weekly            Contractures Contractures Info: Not present    Additional Factors Info  Code Status, Allergies Code Status Info: full Allergies Info: Penicillins, Sulfonamide Derivatives           Current Medications (01/10/2019):  This is the current hospital active medication list Current Facility-Administered Medications  Medication Dose Route Frequency Provider Last Rate Last Dose  . 0.9 %  sodium chloride infusion   Intravenous Continuous Kayleen Memos, DO 100 mL/hr at 01/10/19 0204    . acetaminophen (TYLENOL) tablet 650 mg  650 mg Oral Q6H PRN Opyd, Ilene Qua, MD   650 mg at 01/09/19 2134   Or  . acetaminophen (TYLENOL) suppository 650 mg  650 mg Rectal Q6H PRN Opyd, Ilene Qua, MD      . allopurinol (ZYLOPRIM) tablet 300 mg  300 mg Oral Daily Opyd, Ilene Qua, MD   300 mg at 01/10/19 0819  .  bisacodyl (DULCOLAX) suppository 10 mg  10 mg Rectal Daily PRN Opyd, Ilene Qua, MD   10 mg at 01/09/19 1302  . diazepam (VALIUM) tablet 5 mg  5 mg Oral Q8H PRN Opyd, Ilene Qua, MD   5 mg at 01/08/19 1707  . dicyclomine (BENTYL) tablet 20 mg  20 mg Oral TID PRN Opyd, Ilene Qua, MD      . enoxaparin (LOVENOX) injection 70 mg  70 mg Subcutaneous Q24H Opyd, Ilene Qua, MD   70 mg at 01/09/19 1304  . gabapentin (NEURONTIN) capsule 300 mg  300 mg  Oral QHS Opyd, Ilene Qua, MD   300 mg at 01/09/19 2126  . insulin aspart (novoLOG) injection 0-5 Units  0-5 Units Subcutaneous QHS Opyd, Timothy S, MD      . insulin aspart (novoLOG) injection 0-9 Units  0-9 Units Subcutaneous TID WC Opyd, Ilene Qua, MD   2 Units at 01/10/19 0630  . levothyroxine (SYNTHROID, LEVOTHROID) tablet 50 mcg  50 mcg Oral Q0600 Vianne Bulls, MD   50 mcg at 01/10/19 0523  . morphine 4 MG/ML injection 4 mg  4 mg Intravenous Q4H PRN Opyd, Ilene Qua, MD   4 mg at 01/08/19 1623  . ondansetron (ZOFRAN) tablet 4 mg  4 mg Oral Q6H PRN Opyd, Ilene Qua, MD       Or  . ondansetron (ZOFRAN) injection 4 mg  4 mg Intravenous Q6H PRN Opyd, Ilene Qua, MD      . oxyCODONE-acetaminophen (PERCOCET/ROXICET) 5-325 MG per tablet 1 tablet  1 tablet Oral Q4H PRN Opyd, Ilene Qua, MD   1 tablet at 01/08/19 2036   And  . oxyCODONE (Oxy IR/ROXICODONE) immediate release tablet 5 mg  5 mg Oral Q4H PRN Opyd, Ilene Qua, MD   5 mg at 01/08/19 2036  . polyethylene glycol (MIRALAX / GLYCOLAX) packet 17 g  17 g Oral Daily Irene Pap N, DO   17 g at 01/10/19 6144  . senna-docusate (Senokot-S) tablet 2 tablet  2 tablet Oral BID Irene Pap N, DO   2 tablet at 01/10/19 3154  . venlafaxine XR (EFFEXOR-XR) 24 hr capsule 75 mg  75 mg Oral Daily Opyd, Ilene Qua, MD   75 mg at 01/10/19 0818     Discharge Medications: Please see discharge summary for a list of discharge medications.  Relevant Imaging Results:  Relevant Lab Results:   Additional Information SSN: [id number]  Alberteen Sam, LCSW

## 2019-01-10 NOTE — Clinical Social Work Note (Signed)
Clinical Social Work Assessment  Patient Details  Name: Leon Nelson MRN: [id number] Date of Birth: 04/25/1956  Date of referral:                  Reason for consult:  Facility Placement, Discharge Planning                Permission sought to share information with:  Facility Sport and exercise psychologist, Family Supports Permission granted to share information::  Yes, Verbal Permission Granted  Name::     [place]::  SNFs  Relationship::  spouse  Contact Information:  [phone]  Housing/Transportation Living arrangements for the past 2 months:  Single Family Home Source of Information:  Patient Patient Interpreter Needed:  None Criminal Activity/Legal Involvement Pertinent to Current Situation/Hospitalization:  No - Comment as needed Significant Relationships:  Spouse Lives with:  Spouse Do you feel safe going back to the place where you live?  Yes Need for family participation in patient care:  Yes (Comment)  Care giving concerns: Patient from home with wife. PT recommending SNF.   Social Worker assessment / plan: CSW met with patient, wife, and kids at bedside. Patient alert and oriented. CSW introduced self and role and discussed disposition planning - PT recommendation for SNF.  Patient lives at home with wife. Patient and his family are agreeable to rehab at [facility] and prefer [facility] in [place]. CSW to send out initial referrals. Will provide bed offers and CMS SNF list when available. Patient will need BCBS authorization prior to admitting to a facility. Facility to initiate auth once identified. May experience a delay in [place] with the holiday on Monday.  CSW to follow and support with discharge planning.   Employment status:    Insurance information:  Managed Care(BCBS) PT Recommendations:  [place] / Referral to community resources:  [place]  Patient/Family's Response to care: Patient and family  appreciative of care.  Patient/Family's Understanding of and Emotional Response to Diagnosis, Current Treatment, and Prognosis: Patient and family with good understanding of patient's condition and care needs. They are agreeable to SNF.  Emotional Assessment Appearance:  Appears stated age Attitude/Demeanor/Rapport:  Engaged Affect (typically observed):  Accepting, Calm, Pleasant, Appropriate Orientation:  Oriented to Self, Oriented to Place, Oriented to  Time, Oriented to Situation Alcohol / Substance use:  Not Applicable Psych involvement (Current and /or in the community):  No (Comment)  Discharge Needs  Concerns to be addressed:  Discharge Planning Concerns, Care Coordination Readmission within the last 30 days:  No Current discharge risk:  Physical Impairment Barriers to Discharge:  Ship broker, Continued Medical Work up   [employer], LCSW 01/10/2019, 10:43 AM

## 2019-01-11 LAB — CBC WITH DIFFERENTIAL/PLATELET
Abs Immature Granulocytes: 0.1 10*3/uL — ABNORMAL HIGH (ref 0.00–0.07)
Basophils Absolute: 0 10*3/uL (ref 0.0–0.1)
Basophils Relative: 0 %
Eosinophils Absolute: 0.3 10*3/uL (ref 0.0–0.5)
Eosinophils Relative: 4 %
HCT: 36.3 % — ABNORMAL LOW (ref 39.0–52.0)
Hemoglobin: 11.9 g/dL — ABNORMAL LOW (ref 13.0–17.0)
Immature Granulocytes: 1 %
Lymphocytes Relative: 22 %
Lymphs Abs: 1.9 10*3/uL (ref 0.7–4.0)
MCH: 28.9 pg (ref 26.0–34.0)
MCHC: 32.8 g/dL (ref 30.0–36.0)
MCV: 88.1 fL (ref 80.0–100.0)
Monocytes Absolute: 1 10*3/uL (ref 0.1–1.0)
Monocytes Relative: 12 %
Neutro Abs: 5.2 10*3/uL (ref 1.7–7.7)
Neutrophils Relative %: 61 %
Platelets: 193 10*3/uL (ref 150–400)
RBC: 4.12 MIL/uL — AB (ref 4.22–5.81)
RDW: 12.2 % (ref 11.5–15.5)
WBC: 8.6 10*3/uL (ref 4.0–10.5)
nRBC: 0 % (ref 0.0–0.2)

## 2019-01-11 LAB — CK: Total CK: 764 U/L — ABNORMAL HIGH (ref 49–397)

## 2019-01-11 LAB — GLUCOSE, CAPILLARY
Glucose-Capillary: 149 mg/dL — ABNORMAL HIGH (ref 70–99)
Glucose-Capillary: 229 mg/dL — ABNORMAL HIGH (ref 70–99)
Glucose-Capillary: 136 mg/dL — ABNORMAL HIGH (ref 70–99)
Glucose-Capillary: 153 mg/dL — ABNORMAL HIGH (ref 70–99)

## 2019-01-11 LAB — BASIC METABOLIC PANEL
Anion gap: 8 (ref 5–15)
BUN: 7 mg/dL — ABNORMAL LOW (ref 8–23)
CALCIUM: 8.3 mg/dL — AB (ref 8.9–10.3)
CO2: 27 mmol/L (ref 22–32)
CREATININE: 0.64 mg/dL (ref 0.61–1.24)
Chloride: 105 mmol/L (ref 98–111)
GFR calc Af Amer: >60 mL/min (ref >60)
GFR calc non Af Amer: >60 mL/min (ref >60)
Glucose, Bld: 169 mg/dL — ABNORMAL HIGH (ref 70–99)
Potassium: 3.8 mmol/L (ref 3.5–5.1)
SODIUM: 140 mmol/L (ref 135–145)

## 2019-01-11 MED ORDER — CLINDAMYCIN HCL 150 MG PO CAPS: 150.0000 mg | ORAL_CAPSULE | Freq: Three times a day (TID) | ORAL | Status: DC

## 2019-01-11 MED ORDER — ATENOLOL 50 MG PO TABS
25.0000 mg | ORAL_TABLET | Freq: Every day | ORAL | Status: DC
  Administered 2019-01-11 – 2019-01-15 (×5): 25 mg via ORAL
  Filled 2019-01-11 (×5): qty 1

## 2019-01-11 MED ORDER — CLINDAMYCIN PHOSPHATE 300 MG/50ML IV SOLN
300.0000 mg | Freq: Three times a day (TID) | INTRAVENOUS | Status: AC
  Administered 2019-01-11 (×2): 300 mg via INTRAVENOUS
  Filled 2019-01-11 (×3): qty 50

## 2019-01-11 MED ORDER — ATENOLOL-CHLORTHALIDONE 50-25 MG PO TABS: 0.5000 | ORAL_TABLET | Freq: Every day | ORAL | Status: DC

## 2019-01-11 MED ORDER — CHLORTHALIDONE 25 MG PO TABS
12.5000 mg | ORAL_TABLET | Freq: Every day | ORAL | Status: DC
  Administered 2019-01-11 – 2019-01-15 (×5): 12.5 mg via ORAL
  Filled 2019-01-11 (×6): qty 0.5

## 2019-01-11 NOTE — Progress Notes (Signed)
PROGRESS NOTE  Leon Nelson [facility]:[id number] DOB: 1956/01/12 DOA: 01/07/2019 PCP: Biagio Borg, MD  HPI/Recap of past 24 hours: Leon Nelson a 63 y.o.malewith medical history significant fortype 2 diabetes mellitus, hypertension, hypothyroidism, and chronic bilateral foot and ankle pain status post triple left foot arthrodesis in the outpatient surgery center today, now presenting to the emergency department with severe pain in the right buttock. Patient developed severe right buttock pain after his surgery, was treated with morphine, Valium, and Percocet at the surgery center, improved enough to go home, and had severe worsening in his pain at home, prompting his presentation to the ED. He denies any right buttock pain prior to the surgery and denies any history of similar pain. He reports some paresthesia in the proximal right leg at the outpatient surgery center, but that has since resolved. He was unable to bear weight on the right leg, but this was secondary to pain rather than weakness.   Presented with elevated CPK greater than 9000.  Admitted for rhabdomyolysis.  01/08/2019: Patient seen and examined with his daughters at bedside.  Pain is well controlled on current pain management.  CPK trending down from greater than 9000 to greater than 7000 this morning.  Continue with IV fluid hydration normal saline at 125 cc/h.  Repeat CPK in the morning.  Vital signs are stable.  Continue to monitor vital signs.  01/09/19: Seen and examined at bedside with his daughters present. Reports left foot pain. Has blister noted in dorsal portion of left foot. Febrile this am with no leukocytosis. Advised to use incentive spirometer to avoid atelectasis.  01/10/19: Reports left lower extremity pain is better controlled.  Has been reluctant to take pain medication due to previous experience of opiate-induced constipation.  Denies chest pain, dyspnea or palpitations.  01/11/19: seen and examined at  his bedside. He reports minimal pain in his R foot. Seen by podiatry with recommendations for 3 doses of clindamycin. Plan dressing change tomorrow.   Assessment/Plan: Principal Problem:   Rhabdomyolysis Active Problems:   Hypothyroidism   Diabetes mellitus type 2 with neurological manifestations (Leon Nelson)   Essential hypertension   SIRS (systemic inflammatory response syndrome) (HCC)   Elevated transaminase level  Rhabdomyolysis, improving  - Presented with CPK greater than 9000 which continues to trend down CPK on 01/11/19 720 from 01/10/2019 1200 from 01/09/2019 greater than 2000 Continue IV fluid hydration 100 cc/h CPK am Renal function, no issues Repeat BMP in the morning Monitor urine output  Status-post left foot surgery on 01/07/19  - Underwent triple arthrodesis left foot 01/07/19  -Blisters noted on exam on dorsum portion of left foot -Continue postop management  -start IV clindamycin x 3 doses prophylactically post operatively as recommended by podiatry -get MRSA screening  SIRS  Afebrile obvernight. Blood cx negative to date No leukocytosis Patient instructed to continue to use incentive spirometer to avoid atelectasis  Type II DM  with hyperglycemia - A1c was 6.7% in November  - Managed at home with metformin and glipizide, held on admission  - Continue insulin sliding scale  Hypertension  - Blood pressure at goal  -Continue to hold atenolol and chlorthalidone  Hypothyroidism  - Continue Synthroid    Elevated transaminases  - Mild elevation in transaminases noted with normal t bili, benign exam, levels were normal in November 2019  - Likely secondary to rhabdomyolysis, will trend    Ambulatory dysfunction post surgery PT assessed and recommended SNF CSW assisting with SNF placement  DVT prophylaxis: SQ Lovenox daily  Code Status: Full  Family Communication: Daughters at bedside Consults called: podiatry Disposition:  SNF when podiatry signs  off.     Objective: Vitals:   01/10/19 1944 01/11/19 0226 01/11/19 0500 01/11/19 1331  BP: (!) 157/82 (!) 154/84  138/76  Pulse: 86 75  68  Resp: 18 18  18   Temp: 99.6 F (37.6 C) 99.5 F (37.5 C)  99.1 F (37.3 C)  TempSrc: Oral Oral  Oral  SpO2: 95% 97%  95%  Weight:   (!) 147.1 kg   Height:        Intake/Output Summary (Last 24 hours) at 01/11/2019 1519 Last data filed at 01/11/2019 1240 Gross per 24 hour  Intake 960 ml  Output 2300 ml  Net -1340 ml   Filed Weights   01/08/19 0145 01/10/19 0500 01/11/19 0500  Weight: (!) 139.3 kg (!) 143.5 kg (!) 147.1 kg    Exam:  . General: 63 y.o. year-old male WD WN NAD A&O x 3 . Cardiovascular: RRR no rubs or gallops. No JVD or thyromegaly   . Respiratory: CTA no wheezes or rales Good inspiratory effort . Abdomen: Soft nontender nondistended with normal bowel sounds x4 quadrants. . Musculoskeletal: Trace lower extremity edema.  Left lower extremity is in surgical wrap. Marland Kitchen Psychiatry: Mood is appropriate for condition and setting   Data Reviewed: CBC: Recent Labs  Lab 01/07/19 2301 01/08/19 0219 01/09/19 1051 01/10/19 0445 01/11/19 0614  WBC 13.9* 13.1* 9.7 8.3 8.6  NEUTROABS 10.6* 9.0* 6.9 5.1 5.2  HGB 13.3 12.6* 11.5* 11.5* 11.9*  HCT 40.6 37.8* 35.2* 34.9* 36.3*  MCV 88.1 88.7 88.2 88.1 88.1  PLT 226 240 151 151 409   Basic Metabolic Panel: Recent Labs  Lab 01/07/19 2301 01/08/19 0219 01/09/19 1051 01/10/19 0445 01/11/19 0614  NA 136 136 137 139 140  K 4.2 4.0 3.5 3.5 3.8  CL 99 100 102 103 105  CO2 25 24 27 27 27   GLUCOSE 156* 167* 199* 195* 169*  BUN 24* 24* 9 8 7*  CREATININE 0.95 1.05 0.73 0.51* 0.64  CALCIUM 8.7* 8.5* 8.3* 8.2* 8.3*   GFR: Estimated Creatinine Clearance: 139.1 mL/min (by C-G formula based on SCr of 0.64 mg/dL). Liver Function Tests: Recent Labs  Lab 01/07/19 2301 01/08/19 0219  AST 142* 134*  ALT 62* 58*  ALKPHOS 35* 34*  BILITOT 0.6 0.7  PROT 6.6 6.1*  ALBUMIN 3.5  3.3*   No results for input(s): LIPASE, AMYLASE in the last 168 hours. No results for input(s): AMMONIA in the last 168 hours. Coagulation Profile: No results for input(s): INR, PROTIME in the last 168 hours. Cardiac Enzymes: Recent Labs  Lab 01/07/19 2301 01/08/19 0219 01/09/19 0547 01/10/19 0445 01/11/19 0614  CKTOTAL 9,699* 7,678* 2,103* 1,230* 764*   BNP (last 3 results) No results for input(s): PROBNP in the last 8760 hours. HbA1C: No results for input(s): HGBA1C in the last 72 hours. CBG: Recent Labs  Lab 01/10/19 1035 01/10/19 1637 01/10/19 2140 01/11/19 0551 01/11/19 1103  GLUCAP 204* 165* 180* 149* 229*   Lipid Profile: No results for input(s): CHOL, HDL, LDLCALC, TRIG, CHOLHDL, LDLDIRECT in the last 72 hours. Thyroid Function Tests: No results for input(s): TSH, T4TOTAL, FREET4, T3FREE, THYROIDAB in the last 72 hours. Anemia Panel: No results for input(s): VITAMINB12, FOLATE, FERRITIN, TIBC, IRON, RETICCTPCT in the last 72 hours. Urine analysis:    Component Value Date/Time   COLORURINE YELLOW 01/07/2019 2331   APPEARANCEUR  HAZY (A) 01/07/2019 2331   LABSPEC 1.018 01/07/2019 2331   PHURINE 5.0 01/07/2019 2331   GLUCOSEU NEGATIVE 01/07/2019 2331   GLUCOSEU NEGATIVE 10/27/2018 0838   HGBUR MODERATE (A) 01/07/2019 2331   BILIRUBINUR NEGATIVE 01/07/2019 2331   KETONESUR NEGATIVE 01/07/2019 2331   PROTEINUR NEGATIVE 01/07/2019 2331   UROBILINOGEN 0.2 10/27/2018 0838   NITRITE NEGATIVE 01/07/2019 2331   LEUKOCYTESUR NEGATIVE 01/07/2019 2331   Sepsis Labs: @LABRCNTIP (procalcitonin:4,lacticidven:4)  ) Recent Results (from the past 240 hour(s))  Culture, blood (routine x 2)     Status: None (Preliminary result)   Collection Time: 01/09/19  7:34 PM  Result Value Ref Range Status   Specimen Description BLOOD LEFT HAND  Final   Special Requests   Final    BOTTLES DRAWN AEROBIC ONLY Blood Culture adequate volume   Culture   Final    NO GROWTH 2  DAYS Performed at [facility], [place] [address] [place], [place] [address]    Report Status PENDING  Incomplete  Culture, blood (routine x 2)     Status: None (Preliminary result)   Collection Time: 01/09/19  7:34 PM  Result Value Ref Range Status   Specimen Description BLOOD RIGHT HAND  Final   Special Requests   Final    BOTTLES DRAWN AEROBIC ONLY Blood Culture results may not be optimal due to an inadequate volume of blood received in culture bottles   Culture   Final    NO GROWTH 2 DAYS Performed at [facility], [place] [address] [place], [place] [address]    Report Status PENDING  Incomplete      Studies: No results found.  Scheduled Meds: . allopurinol  300 mg Oral Daily  . atenolol  25 mg Oral Daily   And  . chlorthalidone  12.5 mg Oral Daily  . enoxaparin (LOVENOX) injection  70 mg Subcutaneous Q24H  . gabapentin  300 mg Oral QHS  . insulin aspart  0-5 Units Subcutaneous QHS  . insulin aspart  0-9 Units Subcutaneous TID WC  . levothyroxine  50 mcg Oral Q0600  . polyethylene glycol  17 g Oral Daily  . senna-docusate  2 tablet Oral BID  . venlafaxine XR  75 mg Oral Daily    Continuous Infusions: . sodium chloride 100 mL/hr at 01/11/19 0809  . clindamycin (CLEOCIN) IV       LOS: 2 days     Kayleen Memos, MD [facility] Pager [phone]  If 7PM-7AM, please contact night-coverage [email or web address] Password [facility] 01/11/2019, 3:19 PM

## 2019-01-11 NOTE — Progress Notes (Signed)
Subjective: Leon Nelson is POD #3 s/p left foot triple arthrodesis.  Postoperatively he started developed right hip pain that was treated with morphine, Percocet and he continued have pain and therefore was given 5 mg of Valium.  His pain improved and he is able to move his right leg with significant improvement in symptoms.  He was discharged from outpatient surgery center as he was doing better and vital signs were stable.  On his way home he went to pick up his prescriptions and he started having worsening right hip pain.  Because of this he was directed to go to the emergency room.  He was admitted to the hospital overnight and was found to be in rhabdomyolysis.  Kidney function has been preserved however liver enzymes are elevated.  White blood cell count also elevated. Had a fever on admission but improved.  PT did see him and recommended SNF.   He states that overall he is feeling better and his pain is controlled in the left leg. The pain he was having in the right hip area has resolved. Overall he just feels weak and he is hoping to get in a chair today.   Currently denies any fever, chills, nausea, vomiting. No chest pain, shortness of breath.  Objective:  AAO x3, NAD- was sleeping Posterior splint and bandages are clean, dry, intact to the left lower extremity.  He is able to move the toes without significant discomfort.  There is immediate cap refill time to all the digits and good perfusion to the digits.There is some drainage on the bandage distally where I had drained the blister. The blister has filled back up some. I did look under the bandage and the foot is soft although still some swelling but no other blistering is noted. His foot is elevated currently.  All compartments appear to be soft bilaterally.  No pain with calf compression, swelling, warmth, erythema  Assessment: Postop day #4 status post left foot triple arthrodesis; rhabdomyolysis- improving  Plan: -All treatment  options discussed with the patient including all alternatives, risks, complications.  -Overall he is doing better and his pain is controlled.  He is hoping to get into a chair today.  He is holding off on opioids given constipation issues.  We can use Tylenol as needed for pain for now.  Encourage ice and elevation -Continue to monitor lab work.  Overall improving. -Recommend clindamycin x3 doses for prophylaxis from surgery. -Continue to encourage incentive spirometer. -PT has recommended SNF-awaiting insurance approval. -I will change the bandages tomorrow. -Okay to be discharged from my standpoint once he is medically stable and insurance approves rehab.  Celesta Gentile, DPM O: [phone] C: [phone]   -Overall he states he is doing better his pain is much improved.  Lab work reviewed with him this morning.  CK level coming down as well as white blood cell count normal.  He had a temperature overnight but again his temperature has come down since last night.  We will continue to monitor.  I will continue to follow him while he is inpatient however from a surgical standpoint he can go to SNF when ready and medically stable.  -Continue ice and elevate.   -Continue incentive spirometer.  Celesta Gentile, DPM O: [phone] C: [phone]

## 2019-01-12 ENCOUNTER — Encounter: Payer: BLUE CROSS/BLUE SHIELD | Admitting: *Deleted

## 2019-01-12 LAB — CBC WITH DIFFERENTIAL/PLATELET
Abs Immature Granulocytes: 0.04 10*3/uL (ref 0.00–0.07)
Basophils Absolute: 0 10*3/uL (ref 0.0–0.1)
Basophils Relative: 0 %
EOS ABS: 0.5 10*3/uL (ref 0.0–0.5)
Eosinophils Relative: 5 %
HCT: 37 % — ABNORMAL LOW (ref 39.0–52.0)
Hemoglobin: 12.1 g/dL — ABNORMAL LOW (ref 13.0–17.0)
Immature Granulocytes: 1 %
Lymphocytes Relative: 22 %
Lymphs Abs: 2 10*3/uL (ref 0.7–4.0)
MCH: 29 pg (ref 26.0–34.0)
MCHC: 32.7 g/dL (ref 30.0–36.0)
MCV: 88.7 fL (ref 80.0–100.0)
Monocytes Absolute: 1 10*3/uL (ref 0.1–1.0)
Monocytes Relative: 11 %
Neutro Abs: 5.3 10*3/uL (ref 1.7–7.7)
Neutrophils Relative %: 61 %
Platelets: 228 10*3/uL (ref 150–400)
RBC: 4.17 MIL/uL — ABNORMAL LOW (ref 4.22–5.81)
RDW: 12.4 % (ref 11.5–15.5)
WBC: 8.9 10*3/uL (ref 4.0–10.5)
nRBC: 0 % (ref 0.0–0.2)

## 2019-01-12 LAB — GLUCOSE, CAPILLARY
Glucose-Capillary: 162 mg/dL — ABNORMAL HIGH (ref 70–99)
Glucose-Capillary: 158 mg/dL — ABNORMAL HIGH (ref 70–99)
Glucose-Capillary: 129 mg/dL — ABNORMAL HIGH (ref 70–99)
Glucose-Capillary: 179 mg/dL — ABNORMAL HIGH (ref 70–99)

## 2019-01-12 LAB — BASIC METABOLIC PANEL
Anion gap: 12 (ref 5–15)
BUN: 8 mg/dL (ref 8–23)
CO2: 27 mmol/L (ref 22–32)
Calcium: 8.2 mg/dL — ABNORMAL LOW (ref 8.9–10.3)
Chloride: 101 mmol/L (ref 98–111)
Creatinine, Ser: 0.68 mg/dL (ref 0.61–1.24)
GFR calc Af Amer: >60 mL/min (ref >60)
GFR calc non Af Amer: >60 mL/min (ref >60)
Glucose, Bld: 156 mg/dL — ABNORMAL HIGH (ref 70–99)
Potassium: 3.5 mmol/L (ref 3.5–5.1)
Sodium: 140 mmol/L (ref 135–145)

## 2019-01-12 LAB — MRSA PCR SCREENING: MRSA by PCR: NEGATIVE

## 2019-01-12 LAB — CK: Total CK: 565 U/L — ABNORMAL HIGH (ref 49–397)

## 2019-01-12 NOTE — Progress Notes (Signed)
PROGRESS NOTE  Leon Nelson:[id number] DOB: [birth year] DOA: 01/07/2019 PCP: Biagio Borg, MD  HPI/Recap of past 24 hours: Leon Records Tuckeris a 63 y.o.malewith medical history significant fortype 2 diabetes mellitus, hypertension, hypothyroidism, and chronic bilateral foot and ankle pain status post triple left foot arthrodesis in the outpatient surgery center, now presenting to the emergency department with severe pain in the right buttock. Patient developed severe right buttock pain after his surgery, was treated with morphine, Valium, and Percocet at the surgery center, improved enough to go home. Had severe worsening of his pain at home, prompting his presentation to the ED. Denies right buttock pain prior to the surgery and denies any history of similar pain. Reports some paresthesia in the proximal right leg at the outpatient surgery center, but that has since resolved. He was unable to bear weight on the right leg, but this was secondary to pain rather than weakness.   Presented with elevated CPK greater than 9000.  Admitted for rhabdomyolysis.  Hospital course complicated by intermittent fevers with T-max of 100.6 which was thought to be secondary to atelectasis.  No leukocytosis and no sign of active infective process.  Was prophylactically started on 3 doses of clindamycin as recommended by podiatry.  No recurrences of fever.  Rhabdomyolysis is resolving.  Left lower extremity pain is improving.  01/12/2019: Patient seen and examined at his bedside.  No acute events overnight.  He has no new complaints.  Awaiting bed placement at SNF.    Assessment/Plan: Principal Problem:   Rhabdomyolysis Active Problems:   Hypothyroidism   Diabetes mellitus type 2 with neurological manifestations ([place])   Essential hypertension   SIRS (systemic inflammatory response syndrome) (HCC)   Elevated transaminase level  Rhabdomyolysis, improving  Presented with CPK greater than 9000 which  continues to trend down to 725 on 01/12/19 Renal function, no issues Reduce rate of IV fluid to 50 cc/h of normal saline Repeat CPK in the morning Repeat BMP Monitor urine output Net -4.3 L since admission  Status-post left foot surgery on 01/07/19  -Underwent triple arthrodesis left foot 01/07/19  -Continue postop management  -IV clindamycin x 3 doses prophylactically post operatively as recommended by podiatry -MRSA screening  Resolving SIRS  No recurrent fever  No leukocytosis Patient instructed to continue to use incentive spirometer to avoid atelectasis  Type II DM with hyperglycemia - A1c was 6.7% in November  - Managed at home with metformin and glipizide, held on admission  - Continue insulin sliding scale  Hypertension  - Blood pressure at goal  -Continue to hold atenolol and chlorthalidone  Hypothyroidism  - Continue Synthroid    Elevated transaminases  - Mild elevation in transaminases noted with normal t bili, benign exam, levels were normal in November 2019  - Likely secondary to rhabdomyolysis, will trend    Ambulatory dysfunction post surgery PT assessed and recommended SNF CSW assisting with SNF placement   DVT prophylaxis: SQ Lovenox daily  Code Status: Full  Family Communication: Daughters at bedside Consults called: podiatry Disposition:  SNF possibly tomorrow pending insurance authorization     Objective: Vitals:   01/12/19 0300 01/12/19 0405 01/12/19 0500 01/12/19 1420  BP:  135/82  129/84  Pulse:  72  71  Resp: 18 16  16   Temp:  97.7 F (36.5 C)  98.2 F (36.8 C)  TempSrc:  Oral  Oral  SpO2:  95%  96%  Weight:   (!) 145 kg   Height:  Intake/Output Summary (Last 24 hours) at 01/12/2019 1434 Last data filed at 01/12/2019 1100 Gross per 24 hour  Intake 2997.98 ml  Output 2250 ml  Net 747.98 ml   Filed Weights   01/10/19 0500 01/11/19 0500 01/12/19 0500  Weight: (!) 143.5 kg (!) 147.1 kg (!) 145 kg     Exam:  . General: 63 y.o. year-old male well developed well-nourished in no acute distress.  Alert and oriented x3.  . Cardiovascular: Regular rate and rhythm with no rubs or gallops.  No JVD or thyromegaly noted. Marland Kitchen Respiratory: Clear to auscultation with no wheezes or rales.  Good inspiratory effort. . Abdomen: Soft nontender nondistended with normal bowel sounds x4 quadrants. . Musculoskeletal: Trace lower extremity edema.  Left lower extremity is in surgical wrap. Marland Kitchen Psychiatry: Mood is appropriate for condition and setting   Data Reviewed: CBC: Recent Labs  Lab 01/08/19 0219 01/09/19 1051 01/10/19 0445 01/11/19 0614 01/12/19 0407  WBC 13.1* 9.7 8.3 8.6 8.9  NEUTROABS 9.0* 6.9 5.1 5.2 5.3  HGB 12.6* 11.5* 11.5* 11.9* 12.1*  HCT 37.8* 35.2* 34.9* 36.3* 37.0*  MCV 88.7 88.2 88.1 88.1 88.7  PLT 240 151 151 193 106   Basic Metabolic Panel: Recent Labs  Lab 01/08/19 0219 01/09/19 1051 01/10/19 0445 01/11/19 0614 01/12/19 0407  NA 136 137 139 140 140  K 4.0 3.5 3.5 3.8 3.5  CL 100 102 103 105 101  CO2 24 27 27 27 27   GLUCOSE 167* 199* 195* 169* 156*  BUN 24* 9 8 7* 8  CREATININE 1.05 0.73 0.51* 0.64 0.68  CALCIUM 8.5* 8.3* 8.2* 8.3* 8.2*   GFR: Estimated Creatinine Clearance: 138 mL/min (by C-G formula based on SCr of 0.68 mg/dL). Liver Function Tests: Recent Labs  Lab 01/07/19 2301 01/08/19 0219  AST 142* 134*  ALT 62* 58*  ALKPHOS 35* 34*  BILITOT 0.6 0.7  PROT 6.6 6.1*  ALBUMIN 3.5 3.3*   No results for input(s): LIPASE, AMYLASE in the last 168 hours. No results for input(s): AMMONIA in the last 168 hours. Coagulation Profile: No results for input(s): INR, PROTIME in the last 168 hours. Cardiac Enzymes: Recent Labs  Lab 01/08/19 0219 01/09/19 0547 01/10/19 0445 01/11/19 0614 01/12/19 0407  CKTOTAL 7,678* 2,103* 1,230* 764* 565*   BNP (last 3 results) No results for input(s): PROBNP in the last 8760 hours. HbA1C: No results for input(s):  HGBA1C in the last 72 hours. CBG: Recent Labs  Lab 01/11/19 1103 01/11/19 1641 01/11/19 2158 01/12/19 0650 01/12/19 1212  GLUCAP 229* 136* 153* 162* 158*   Lipid Profile: No results for input(s): CHOL, HDL, LDLCALC, TRIG, CHOLHDL, LDLDIRECT in the last 72 hours. Thyroid Function Tests: No results for input(s): TSH, T4TOTAL, FREET4, T3FREE, THYROIDAB in the last 72 hours. Anemia Panel: No results for input(s): VITAMINB12, FOLATE, FERRITIN, TIBC, IRON, RETICCTPCT in the last 72 hours. Urine analysis:    Component Value Date/Time   COLORURINE YELLOW 01/07/2019 2331   APPEARANCEUR HAZY (A) 01/07/2019 2331   LABSPEC 1.018 01/07/2019 2331   PHURINE 5.0 01/07/2019 2331   GLUCOSEU NEGATIVE 01/07/2019 2331   GLUCOSEU NEGATIVE 10/27/2018 0838   HGBUR MODERATE (A) 01/07/2019 2331   BILIRUBINUR NEGATIVE 01/07/2019 2331   KETONESUR NEGATIVE 01/07/2019 2331   PROTEINUR NEGATIVE 01/07/2019 2331   UROBILINOGEN 0.2 10/27/2018 0838   NITRITE NEGATIVE 01/07/2019 2331   LEUKOCYTESUR NEGATIVE 01/07/2019 2331   Sepsis Labs: @LABRCNTIP (procalcitonin:4,lacticidven:4)  ) Recent Results (from the past 240 hour(s))  Culture, blood (routine x  2)     Status: None (Preliminary result)   Collection Time: 01/09/19  7:34 PM  Result Value Ref Range Status   Specimen Description BLOOD LEFT HAND  Final   Special Requests   Final    BOTTLES DRAWN AEROBIC ONLY Blood Culture adequate volume   Culture   Final    NO GROWTH 3 DAYS Performed at [facility], [place] [address], [place], [place] [address]    Report Status PENDING  Incomplete  Culture, blood (routine x 2)     Status: None (Preliminary result)   Collection Time: 01/09/19  7:34 PM  Result Value Ref Range Status   Specimen Description BLOOD RIGHT HAND  Final   Special Requests   Final    BOTTLES DRAWN AEROBIC ONLY Blood Culture results may not be optimal due to an inadequate volume of blood received in culture bottles   Culture   Final     NO GROWTH 3 DAYS Performed at [facility], [place] [address], [place], [place] [address]    Report Status PENDING  Incomplete      Studies: No results found.  Scheduled Meds: . allopurinol  300 mg Oral Daily  . atenolol  25 mg Oral Daily   And  . chlorthalidone  12.5 mg Oral Daily  . enoxaparin (LOVENOX) injection  70 mg Subcutaneous Q24H  . gabapentin  300 mg Oral QHS  . insulin aspart  0-5 Units Subcutaneous QHS  . insulin aspart  0-9 Units Subcutaneous TID WC  . levothyroxine  50 mcg Oral Q0600  . polyethylene glycol  17 g Oral Daily  . senna-docusate  2 tablet Oral BID  . venlafaxine XR  75 mg Oral Daily    Continuous Infusions: . sodium chloride 50 mL/hr at 01/12/19 0836  . clindamycin (CLEOCIN) IV Stopped (01/11/19 2341)     LOS: 3 days     Kayleen Memos, MD [facility] Pager [phone]  If 7PM-7AM, please contact night-coverage [email or web address] Password [facility] 01/12/2019, 2:34 PM

## 2019-01-12 NOTE — Progress Notes (Signed)
[facility] accepts patient and will start [place] today.   CSW will continue to follow up. Patient notified of [facility] acceptance and insurance process.   Corin[place], [place]

## 2019-01-12 NOTE — Progress Notes (Signed)
This encounter was created in error - please disregard.

## 2019-01-12 NOTE — Progress Notes (Signed)
Physical Therapy Treatment Patient Details Name: Leon Nelson MRN: [id number] DOB: 07/29/1956 Today's Date: 01/12/2019    History of Present Illness Patient is a 63 y/o male who presents with right buttock pain which started after a 4 hour surgery on his ankle. 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 PMH includes HTN, HLD, DM, depression.    PT Comments    Patient seen for mobility progression. Pt is making gradual progress toward PT goals and tolerated session well. Pt is able to ambulate 10 ft X 2 trials with min A +2 and maintain NWB status. Continue to progress as tolerated with anticipated d/c to SNF for further skilled PT services.     Follow Up Recommendations  SNF;Supervision for mobility/OOB     Equipment Recommendations  Rolling walker with 5" wheels;Wheelchair (measurements PT);Wheelchair cushion (measurements PT);3in1 (PT)(bariatric wide BSC)    Recommendations for Other Services       Precautions / Restrictions Precautions Precautions: Fall Restrictions Weight Bearing Restrictions: Yes LLE Weight Bearing: Non weight bearing    Mobility  Bed Mobility               General bed mobility comments: pt on BSC upon arrival  Transfers Overall transfer level: Needs assistance Equipment used: Rolling walker (2 wheeled) Transfers: Sit to/from Stand Sit to Stand: Min assist;Mod assist;+2 physical assistance         General transfer comment: increased assist needed from recliner vs BSC due to lower surface; cues for safe hand placement and LE positioning prior to standing; assist to power up into standing  Ambulation/Gait Ambulation/Gait assistance: Min assist;+2 safety/equipment Gait Distance (Feet): (10 ft X 2 trials) Assistive device: Rolling walker (2 wheeled) Gait Pattern/deviations: Step-to pattern Gait velocity: decreased   General Gait Details: cues for posture, sequencing, and proximity to [employer] Mobility     Modified Rankin (Stroke Patients Only)       Balance Overall balance assessment: Needs assistance Sitting-balance support: Feet supported;Single extremity supported Sitting balance-Leahy Scale: Good     Standing balance support: During functional activity;Bilateral upper extremity supported Standing balance-Leahy Scale: Poor                              Cognition Arousal/Alertness: Awake/alert Behavior During Therapy: WFL for tasks assessed/performed Overall Cognitive Status: Within Functional Limits for tasks assessed                                        Exercises General Exercises - Lower Extremity Quad Sets: Both;Strengthening Long Arc Quad: Both;Strengthening Straight Leg Raises: Both;Strengthening    General Comments        Pertinent Vitals/Pain Pain Assessment: Faces Faces Pain Scale: Hurts little more Pain Location: R LE/buttocks Pain Descriptors / Indicators: Sore Pain Intervention(s): Limited activity within patient's tolerance;Monitored during session;Repositioned    Home Living                      Prior Function            PT Goals (current goals can now be found in the care plan section) Acute Rehab PT Goals Patient Stated Goal: to get stronger Progress towards PT goals: Progressing toward goals    Frequency    Min 3X/week  PT Plan Current plan remains appropriate    Co-evaluation              AM-PAC PT "6 Clicks" Mobility   Outcome Measure  Help needed turning from your back to your side while in a flat bed without using bedrails?: A Little Help needed moving from lying on your back to sitting on the side of a flat bed without using bedrails?: A Little Help needed moving to and from a bed to a chair (including a wheelchair)?: A Lot Help needed standing up from a chair using your arms (e.g., wheelchair or bedside chair)?: A Lot Help needed to walk in hospital room?: A Lot Help needed  climbing 3-5 steps with a railing? : Total 6 Click Score: 13    End of Session Equipment Utilized During Treatment: Gait belt Activity Tolerance: Patient tolerated treatment well Patient left: with call bell/phone within reach;in chair Nurse Communication: Mobility status PT Visit Diagnosis: Muscle weakness (generalized) (M62.81);Difficulty in walking, not elsewhere classified (R26.2);Pain Pain - Right/Left: Left Pain - part of body: Ankle and joints of foot     Time: 3888-7579 PT Time Calculation (min) (ACUTE ONLY): 24 min  Charges:  $Gait Training: 23-37 mins                     Earney Navy, PTA Acute Rehabilitation Services Pager: [phone] Office: [phone]     Darliss Cheney 01/12/2019, 4:39 PM

## 2019-01-12 NOTE — Progress Notes (Signed)
Subjective: Leon Nelson is POD #5 s/p left foot triple arthrodesis.  Postoperatively he started developed right hip pain that was treated with morphine, Percocet and he continued have pain and therefore was given 5 mg of Valium.  His pain improved and he is able to move his right leg with significant improvement in symptoms.  He was discharged from outpatient surgery center as he was doing better and vital signs were stable.  On his way home he went to pick up his prescriptions and he started having worsening right hip pain.  Because of this he was directed to go to the emergency room.  He was admitted to the hospital overnight and was found to be in rhabdomyolysis.  Kidney function has been preserved however liver enzymes are elevated.  White blood cell count also elevated. Had a fever on admission but improved.  PT did see him and recommended SNF. He is awaiting placement  Today seen him in sitting in a chair he states he feels good to get out of the bed.  Overall he feels well and his pain is controlled.  Denies any fevers, chills, nausea, vomiting.  No calf pain, chest pain, shortness of breath.  Objective:  AAO x3, NAD-sitting in chair Posterior splint and bandages are clean, dry, intact to the left lower extremity.  There is some strikethrough to the bandage distally from where the blister drained.  I remove the bandage today.  There was some mild reoccurrence of the blister to the dorsal forefoot that refill but no new blisters are present.  Clear, posterior fluid was removed.  Incision on both sides of the ankle are well coapted without any evidence of dehiscence and staples are intact.  There is mild surrounding erythema along the medial incision but minimal on the lateral incision.  No ascending cellulitis or increase in warmth.  Minimal tenderness palpation of the surgical site.  Overall the foot is much more rectus position compared to preoperatively.  There is no pain with calf compression,  swelling, warmth, erythema.  The compartments are supple bilaterally.  Assessment: Postop day #5 status post left foot triple arthrodesis; rhabdomyolysis- improving  Plan: -All treatment options discussed with the patient including all alternatives, risks, complications.  -Bandage was changed today.  Betadine paint over the blister site as well as the incisions followed by dry sterile dressing.  A well-padded fiberglass posterior splint was applied. -Continue nonweightbearing.  He is currently waiting SNF placement.  -Continue ice elevate at all times. -Overall his pain is controlled.  No fevers and white blood cell count normal.  Continue clindamycin as prophylaxis. -Continue to encourage incentive spirometer. -I will continue to follow him while inpatient.  If discharged tomorrow we will see him on Friday for dressing change.   Celesta Gentile, DPM O: [phone] C: [phone]

## 2019-01-13 LAB — GLUCOSE, CAPILLARY
Glucose-Capillary: 147 mg/dL — ABNORMAL HIGH (ref 70–99)
Glucose-Capillary: 223 mg/dL — ABNORMAL HIGH (ref 70–99)
Glucose-Capillary: 123 mg/dL — ABNORMAL HIGH (ref 70–99)
Glucose-Capillary: 238 mg/dL — ABNORMAL HIGH (ref 70–99)

## 2019-01-13 LAB — CK: Total CK: 357 U/L (ref 49–397)

## 2019-01-13 LAB — BASIC METABOLIC PANEL
Anion gap: 8 (ref 5–15)
BUN: 10 mg/dL (ref 8–23)
CO2: 30 mmol/L (ref 22–32)
Calcium: 9 mg/dL (ref 8.9–10.3)
Chloride: 102 mmol/L (ref 98–111)
Creatinine, Ser: 0.83 mg/dL (ref 0.61–1.24)
GFR calc non Af Amer: >60 mL/min (ref >60)
Glucose, Bld: 156 mg/dL — ABNORMAL HIGH (ref 70–99)
Potassium: 4.1 mmol/L (ref 3.5–5.1)
Sodium: 140 mmol/L (ref 135–145)

## 2019-01-13 LAB — CBC
HCT: 37.3 % — ABNORMAL LOW (ref 39.0–52.0)
Hemoglobin: 12.5 g/dL — ABNORMAL LOW (ref 13.0–17.0)
MCH: 29.7 pg (ref 26.0–34.0)
MCHC: 33.5 g/dL (ref 30.0–36.0)
MCV: 88.6 fL (ref 80.0–100.0)
NRBC: 0 % (ref 0.0–0.2)
Platelets: 259 10*3/uL (ref 150–400)
RBC: 4.21 MIL/uL — ABNORMAL LOW (ref 4.22–5.81)
RDW: 12.5 % (ref 11.5–15.5)
WBC: 9.5 10*3/uL (ref 4.0–10.5)

## 2019-01-13 NOTE — Progress Notes (Signed)
Occupational Therapy Treatment Patient Details Name: Leon Nelson MRN: [id number] DOB: Jun 08, 1956 Today's Date: 01/13/2019    History of present illness Patient is a 63 y/o male who presents with right buttock pain which started after a 4 hour surgery on his ankle. 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 PMH includes HTN, HLD, DM, depression.   OT comments  Pt progressing towards established OT goals and continues to present with high motivation. Pt participating in BUE exercises with theraband; 10 reps. Provided education on AE for LB ADLs. Pt demonstrating understanding and donning pants and socks with Min A for safety in standing. Pt presenting with decreased endurance and required seated rest breaks. Continue to recommend dc to SNF and will continue to follow acutely as admitted.    Follow Up Recommendations  SNF;Supervision/Assistance - 24 hour    Equipment Recommendations  3 in 1 bedside commode(Bariatric BSC)    Recommendations for Other Services      Precautions / Restrictions Precautions Precautions: Fall Restrictions Weight Bearing Restrictions: Yes LLE Weight Bearing: Non weight bearing       Mobility Bed Mobility               General bed mobility comments: In recliner upon arrival  Transfers Overall transfer level: Needs assistance Equipment used: Rolling walker (2 wheeled) Transfers: Sit to/from Stand Sit to Stand: Min guard;Min assist         General transfer comment: Min A for power up into standing during LB dressing activity. During second attempt, pt able to perform with Min Guard A for safety. Cues for weight shift. Pt fatigues quickly     Balance Overall balance assessment: Needs assistance Sitting-balance support: Feet supported;Single extremity supported Sitting balance-Leahy Scale: Good Sitting balance - Comments: Able to complete HEP at EOB without LOB with general supervision. Wife able to provide this.    Standing balance support: During  functional activity;Bilateral upper extremity supported Standing balance-Leahy Scale: Poor Standing balance comment: Reliant on BUE support as well as assistance.                            ADL either performed or assessed with clinical judgement   ADL Overall ADL's : Needs assistance/impaired               Lower Body Bathing Details (indicate cue type and reason): Educated pt on AE for LB bathing     Lower Body Dressing: Minimal assistance;With adaptive equipment;Sit to/from stand Lower Body Dressing Details (indicate cue type and reason): Educating pt on AE for LB dressing. Pt donning pants with Min A for standing balance and to maintain pants at hips with them dropping at times. Pt able to doff/don socks with AE.  Toilet Transfer: Minimal assistance;Ambulation;RW(simulated to recliner) Toilet Transfer Details (indicate cue type and reason): Pt performing          Functional mobility during ADLs: Minimal assistance;Rolling walker General ADL Comments: Pt continues to be highly motivated to participate in therapy. Pt able to maintain NWB status during ADLs and functional transfers. Provided education on AE for LB ADLs. Pt fatigues quickly and required seated rest breaks.     Vision   Vision Assessment?: No apparent visual deficits   Perception     Praxis      Cognition Arousal/Alertness: Awake/alert Behavior During Therapy: WFL for tasks assessed/performed Overall Cognitive Status: Within Functional Limits for tasks assessed  General Comments: Highly motivated        Exercises Exercises: General Upper Extremity General Exercises - Upper Extremity Shoulder Flexion: Strengthening;10 reps;Both;Theraband Theraband Level (Shoulder Flexion): Level 2 (Red) Shoulder Horizontal ABduction: Theraband;10 reps;Seated Theraband Level (Shoulder Horizontal Abduction): Level 2 (Red) Elbow Flexion: Strengthening;AROM;10  reps;Theraband;Seated;Both Theraband Level (Elbow Flexion): Level 2 (Red) Elbow Extension: AROM;Both;Strengthening;10 reps;Seated;Theraband Theraband Level (Elbow Extension): Level 2 (Red)   Shoulder Instructions       General Comments Two daughters present during begining of session    Pertinent Vitals/ Pain       Pain Assessment: 0-10 Pain Score: 1  Pain Location: R LE/buttocks Pain Descriptors / Indicators: Sore Pain Intervention(s): Monitored during session;Repositioned  Home Living                                          Prior Functioning/Environment              Frequency  Min 2X/week        Progress Toward Goals  OT Goals(current goals can now be found in the care plan section)  Progress towards OT goals: Progressing toward goals  Acute Rehab OT Goals Patient Stated Goal: to get stronger OT Goal Formulation: With patient/family Time For Goal Achievement: 01/24/19 Potential to Achieve Goals: Good ADL Goals Pt Will Perform Grooming: with min assist;standing Pt Will Perform Upper Body Dressing: with modified independence;sitting Pt Will Perform Lower Body Dressing: with min assist;sit to/from stand;with adaptive equipment Pt Will Transfer to Toilet: with min assist;stand pivot transfer;bedside commode Pt Will Perform Toileting - Clothing Manipulation and hygiene: with min assist;sit to/from stand  Plan Discharge plan remains appropriate    Co-evaluation                 AM-PAC OT "6 Clicks" Daily Activity     Outcome Measure   Help from another person eating meals?: None Help from another person taking care of personal grooming?: A Little Help from another person toileting, which includes using toliet, bedpan, or urinal?: A Lot Help from another person bathing (including washing, rinsing, drying)?: A Lot Help from another person to put on and taking off regular upper body clothing?: None Help from another person to put on and  taking off regular lower body clothing?: A Lot 6 Click Score: 17    End of Session Equipment Utilized During Treatment: Gait belt;Rolling walker  OT Visit Diagnosis: Other abnormalities of gait and mobility (R26.89);Pain;Muscle weakness (generalized) (M62.81) Pain - Right/Left: Left Pain - part of body: Ankle and joints of foot   Activity Tolerance Patient tolerated treatment well   Patient Left with call bell/phone within reach;in chair   Nurse Communication Mobility status        Time: 3474-2595 OT Time Calculation (min): 25 min  Charges: OT General Charges $OT Visit: 1 Visit OT Treatments $Self Care/Home Management : 23-37 mins  [place], OTR/L Acute Rehab Pager: [phone] Office: [place] 01/13/2019, 5:17 PM

## 2019-01-13 NOTE — Plan of Care (Signed)
  Problem: Education: Goal: Knowledge of General Education information will improve Description Including pain rating scale, medication(s)/side effects and non-pharmacologic comfort measures Outcome: Progressing   Problem: Health Behavior/Discharge Planning: Goal: Ability to manage health-related needs will improve Outcome: Progressing   

## 2019-01-13 NOTE — Progress Notes (Signed)
PROGRESS NOTE  Leon Nelson:[id number] DOB: 1956/03/10 DOA: 01/07/2019 PCP: Leon Borg, MD  HPI/Recap of past 24 hours: Leon Nelson a 63 y.o.malewith medical history significant fortype 2 diabetes mellitus, hypertension, hypothyroidism, and chronic bilateral foot and ankle pain status post triple left foot arthrodesis in the outpatient surgery center, now presenting to the emergency department with severe pain in the right buttock. Patient developed severe right buttock pain after his surgery, was treated with morphine, Valium, and Percocet at the surgery center, improved enough to go home. Had severe worsening of his pain at home, prompting his presentation to the ED. Denies right buttock pain prior to the surgery and denies any history of similar pain. Reports some paresthesia in the proximal right leg at the outpatient surgery center, but that has since resolved. He was unable to bear weight on the right leg, but this was secondary to pain rather than weakness.   Presented with elevated CPK greater than 9000.  Admitted for rhabdomyolysis.  Hospital course complicated by intermittent fevers with T-max of 100.6 which was thought to be secondary to atelectasis.  No leukocytosis and no sign of active infective process.  Was prophylactically started on 3 doses of clindamycin as recommended by podiatry.  No recurrences of fever.  Rhabdomyolysis is resolving.  Left lower extremity pain is improving.  01/12/2019: Patient seen and examined at his bedside.  No acute events overnight.  He has no new complaints.  Awaiting bed placement at SNF.  01/13/2019: Patient seen alongside to the nurse.  No new complaints.  Patient is awaiting insurance approval for skilled nursing facility placement.   Assessment/Plan: Principal Problem:   Rhabdomyolysis Active Problems:   Hypothyroidism   Diabetes mellitus type 2 with neurological manifestations (Leon Nelson)   Essential hypertension   SIRS  (systemic inflammatory response syndrome) (HCC)   Elevated transaminase level  Rhabdomyolysis, improving  Presented with CPK greater than 9000 which continues to trend down to 725 on 01/12/19 Renal function, no issues Reduce rate of IV fluid to 50 cc/h of normal saline Repeat CPK in the morning Repeat BMP Monitor urine output Net -4.3 L since admission 01/13/2019: CPK is down to 357.  Status-post left foot surgery on 01/07/19  -Underwent triple arthrodesis left foot 01/07/19  -Continue postop management  -IV clindamycin x 3 doses prophylactically post operatively as recommended by podiatry -MRSA screening -01/13/2019: Stable.  Pursue disposition.  Resolving SIRS  No recurrent fever  No leukocytosis Patient instructed to continue to use incentive spirometer to avoid atelectasis -01/13/2019: Sirs has resolved.  Type II DM with hyperglycemia - A1c was 6.7% in November  - Managed at home with metformin and glipizide, held on admission  - Continue insulin sliding scale 01/13/2019: Continue to optimize.  Hypertension  - Blood pressure at goal  -Continue to hold atenolol and chlorthalidone 01/13/2019: Continue to optimize.  Hypothyroidism  - Continue Synthroid    Elevated transaminases  - Mild elevation in transaminases noted with normal t bili, benign exam, levels were normal in November 2019  - Likely secondary to rhabdomyolysis, will trend   01/13/2019: Will repeat LFTs tomorrow.  Ambulatory dysfunction post surgery PT assessed and recommended SNF CSW assisting with SNF placement   DVT prophylaxis: SQ Lovenox daily  Code Status: Full  Family Communication: Daughters at bedside Consults called: podiatry Disposition:  SNF possibly tomorrow pending insurance authorization     Objective: Vitals:   01/12/19 2010 01/13/19 0325 01/13/19 1009 01/13/19 1340  BP: 137/78 Marland Kitchen)  143/88 (!) 143/84 133/74  Pulse: 80 71 79 66  Resp: 16 18 18 17   Temp: 97.8 F (36.6 C) 97.6  F (36.4 C) 99.3 F (37.4 C) 98 F (36.7 C)  TempSrc: Oral Axillary Oral Oral  SpO2: 98% 93% 96% 99%  Weight:      Height:        Intake/Output Summary (Last 24 hours) at 01/13/2019 1757 Last data filed at 01/13/2019 1400 Gross per 24 hour  Intake 951.63 ml  Output 2850 ml  Net -1898.37 ml   Filed Weights   01/10/19 0500 01/11/19 0500 01/12/19 0500  Weight: (!) 143.5 kg (!) 147.1 kg (!) 145 kg    Exam:  . General: Obese.  Not in any distress.  Patient is awake and alert. . Cardiovascular: Regular rate and rhythm with no rubs or gallops.   Marland Kitchen Respiratory: Clear to auscultation.   . Abdomen: Obese, soft and nontender.  Organs are difficult to assess.   . Musculoskeletal: Left lower extremity is in surgical wrap.  Data Reviewed: CBC: Recent Labs  Lab 01/08/19 0219 01/09/19 1051 01/10/19 0445 01/11/19 0614 01/12/19 0407 01/13/19 0318  WBC 13.1* 9.7 8.3 8.6 8.9 9.5  NEUTROABS 9.0* 6.9 5.1 5.2 5.3  --   HGB 12.6* 11.5* 11.5* 11.9* 12.1* 12.5*  HCT 37.8* 35.2* 34.9* 36.3* 37.0* 37.3*  MCV 88.7 88.2 88.1 88.1 88.7 88.6  PLT 240 151 151 193 228 938   Basic Metabolic Panel: Recent Labs  Lab 01/09/19 1051 01/10/19 0445 01/11/19 0614 01/12/19 0407 01/13/19 0318  NA 137 139 140 140 140  K 3.5 3.5 3.8 3.5 4.1  CL 102 103 105 101 102  CO2 27 27 27 27 30   GLUCOSE 199* 195* 169* 156* 156*  BUN 9 8 7* 8 10  CREATININE 0.73 0.51* 0.64 0.68 0.83  CALCIUM 8.3* 8.2* 8.3* 8.2* 9.0   GFR: Estimated Creatinine Clearance: 133 mL/min (by C-G formula based on SCr of 0.83 mg/dL). Liver Function Tests: Recent Labs  Lab 01/07/19 2301 01/08/19 0219  AST 142* 134*  ALT 62* 58*  ALKPHOS 35* 34*  BILITOT 0.6 0.7  PROT 6.6 6.1*  ALBUMIN 3.5 3.3*   No results for input(s): LIPASE, AMYLASE in the last 168 hours. No results for input(s): AMMONIA in the last 168 hours. Coagulation Profile: No results for input(s): INR, PROTIME in the last 168 hours. Cardiac Enzymes: Recent  Labs  Lab 01/09/19 0547 01/10/19 0445 01/11/19 0614 01/12/19 0407 01/13/19 0318  CKTOTAL 2,103* 1,230* 764* 565* 357   BNP (last 3 results) No results for input(s): PROBNP in the last 8760 hours. HbA1C: No results for input(s): HGBA1C in the last 72 hours. CBG: Recent Labs  Lab 01/12/19 1633 01/12/19 2142 01/13/19 0621 01/13/19 1113 01/13/19 1646  GLUCAP 129* 179* 147* 223* 123*   Lipid Profile: No results for input(s): CHOL, HDL, LDLCALC, TRIG, CHOLHDL, LDLDIRECT in the last 72 hours. Thyroid Function Tests: No results for input(s): TSH, T4TOTAL, FREET4, T3FREE, THYROIDAB in the last 72 hours. Anemia Panel: No results for input(s): VITAMINB12, FOLATE, FERRITIN, TIBC, IRON, RETICCTPCT in the last 72 hours. Urine analysis:    Component Value Date/Time   COLORURINE YELLOW 01/07/2019 2331   APPEARANCEUR HAZY (A) 01/07/2019 2331   LABSPEC 1.018 01/07/2019 2331   PHURINE 5.0 01/07/2019 2331   GLUCOSEU NEGATIVE 01/07/2019 2331   GLUCOSEU NEGATIVE 10/27/2018 0838   HGBUR MODERATE (A) 01/07/2019 2331   BILIRUBINUR NEGATIVE 01/07/2019 2331   KETONESUR NEGATIVE 01/07/2019 2331  PROTEINUR NEGATIVE 01/07/2019 2331   UROBILINOGEN 0.2 10/27/2018 0838   NITRITE NEGATIVE 01/07/2019 2331   LEUKOCYTESUR NEGATIVE 01/07/2019 2331   Sepsis Labs: @LABRCNTIP (procalcitonin:4,lacticidven:4)  ) Recent Results (from the past 240 hour(s))  Culture, blood (routine x 2)     Status: None (Preliminary result)   Collection Time: 01/09/19  7:34 PM  Result Value Ref Range Status   Specimen Description BLOOD LEFT HAND  Final   Special Requests   Final    BOTTLES DRAWN AEROBIC ONLY Blood Culture adequate volume   Culture   Final    NO GROWTH 4 DAYS Performed at [facility], [place] [address], [place], [place] [address]    Report Status PENDING  Incomplete  Culture, blood (routine x 2)     Status: None (Preliminary result)   Collection Time: 01/09/19  7:34 PM  Result Value Ref Range  Status   Specimen Description BLOOD RIGHT HAND  Final   Special Requests   Final    BOTTLES DRAWN AEROBIC ONLY Blood Culture results may not be optimal due to an inadequate volume of blood received in culture bottles   Culture   Final    NO GROWTH 4 DAYS Performed at [facility], [place] [address] [place], [place] [address]    Report Status PENDING  Incomplete  MRSA PCR Screening     Status: None   Collection Time: 01/12/19  3:45 PM  Result Value Ref Range Status   MRSA by PCR NEGATIVE NEGATIVE Final    Comment:        The GeneXpert MRSA Assay (FDA approved for NASAL specimens only), is one component of a comprehensive MRSA colonization surveillance program. It is not intended to diagnose MRSA infection nor to guide or monitor treatment for MRSA infections. Performed at [facility], [place] [address] [place], [place] [address]       Studies: No results found.  Scheduled Meds: . allopurinol  300 mg Oral Daily  . atenolol  25 mg Oral Daily   And  . chlorthalidone  12.5 mg Oral Daily  . enoxaparin (LOVENOX) injection  70 mg Subcutaneous Q24H  . gabapentin  300 mg Oral QHS  . insulin aspart  0-5 Units Subcutaneous QHS  . insulin aspart  0-9 Units Subcutaneous TID WC  . levothyroxine  50 mcg Oral Q0600  . polyethylene glycol  17 g Oral Daily  . senna-docusate  2 tablet Oral BID  . venlafaxine XR  75 mg Oral Daily    Continuous Infusions: . sodium chloride 50 mL/hr at 01/12/19 0836     LOS: 4 days     Bonnell Public, MD [facility] Pager [phone]  If 7PM-7AM, please contact night-coverage [email or web address] Password [facility] 01/13/2019, 5:57 PM

## 2019-01-13 NOTE — Progress Notes (Signed)
Subjective: Leon Nelson is POD #6 s/p left foot triple arthrodesis.  Postoperatively he started developed right hip pain that was treated with morphine, Percocet and he continued have pain and therefore was given 5 mg of Valium.  His pain improved and he is able to move his right leg with significant improvement in symptoms.  He was discharged from outpatient surgery center as he was doing better and vital signs were stable.  On his way home he went to pick up his prescriptions and he started having worsening right hip pain.  Because of this he was directed to go to the emergency room.  He was admitted to the hospital overnight and was found to be in rhabdomyolysis.  Kidney function has been preserved however liver enzymes are elevated.  White blood cell count also elevated. Had a fever on admission but improved.  PT did see him and recommended SNF. He is awaiting placement  Today seen him is in bed and eating. He states that he feels well. His pain in the left foot is 1/10. Overall he feels much better. He only feels weak when he gets up to stand.  Denies any fevers, chills, nausea, vomiting.  No calf pain, chest pain, shortness of breath.  Objective:  AAO x3, NAD Posterior splint and bandages are clean, dry, intact to the left lower extremity.  Small amount of drainage on the bandage distally from where the blister has drained. There is no pain that I can elicit today. Splint is fitting well.  There is no pain with calf compression, swelling, warmth, erythema.  The compartments are supple bilaterally.  Assessment: Postop day #6 status post left foot triple arthrodesis; rhabdomyolysis- improving  Plan: -All treatment options discussed with the patient including all alternatives, risks, complications.  -Lab work is normral and afebrile. He is doing much better and awaiting placement.  -Will change the bandage tomorrow if he is there otherwise I will see him in the office Thursday or Friday for  dressing change.  -Continue nonweightbearing.  He is currently waiting SNF placement.  -Continue ice elevate at all times. -Overall his pain is controlled.  No fevers and white blood cell count normal.   -Continue clindamycin as prophylaxis. -Continue to encourage incentive spirometer. -I will continue to follow him while inpatient.  If discharged tomorrow we will see him on Friday for dressing change.   Leon Nelson, DPM O: [phone] C: [phone]

## 2019-01-14 LAB — COMPREHENSIVE METABOLIC PANEL
ALT: 48 U/L — ABNORMAL HIGH (ref 0–44)
AST: 39 U/L (ref 15–41)
Albumin: 2.9 g/dL — ABNORMAL LOW (ref 3.5–5.0)
Alkaline Phosphatase: 39 U/L (ref 38–126)
Anion gap: 9 (ref 5–15)
BUN: 10 mg/dL (ref 8–23)
CO2: 31 mmol/L (ref 22–32)
Calcium: 9.2 mg/dL (ref 8.9–10.3)
Chloride: 100 mmol/L (ref 98–111)
Creatinine, Ser: 0.86 mg/dL (ref 0.61–1.24)
GFR calc Af Amer: >60 mL/min (ref >60)
GFR calc non Af Amer: >60 mL/min (ref >60)
Glucose, Bld: 160 mg/dL — ABNORMAL HIGH (ref 70–99)
Potassium: 3.7 mmol/L (ref 3.5–5.1)
Sodium: 140 mmol/L (ref 135–145)
Total Bilirubin: 0.5 mg/dL (ref 0.3–1.2)
Total Protein: 6.4 g/dL — ABNORMAL LOW (ref 6.5–8.1)

## 2019-01-14 LAB — CULTURE, BLOOD (ROUTINE X 2)
Culture: NO GROWTH
Culture: NO GROWTH
Special Requests: ADEQUATE

## 2019-01-14 LAB — GLUCOSE, CAPILLARY
Glucose-Capillary: 154 mg/dL — ABNORMAL HIGH (ref 70–99)
Glucose-Capillary: 269 mg/dL — ABNORMAL HIGH (ref 70–99)
Glucose-Capillary: 135 mg/dL — ABNORMAL HIGH (ref 70–99)
Glucose-Capillary: 142 mg/dL — ABNORMAL HIGH (ref 70–99)

## 2019-01-14 NOTE — Progress Notes (Signed)
Physical Therapy Treatment Patient Details Name: Leon Nelson MRN: [id number] DOB: 10-21-1956 Today's Date: 01/14/2019    History of Present Illness Patient is a 63 y/o male who presents with right buttock pain which started after a 4 hour surgery on his ankle. 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 PMH includes HTN, HLD, DM, depression.    PT Comments    Patient seen for mobility progression. Pt is making progress toward PT goals and tolerated session well. C/o pain in R knee with ambulation. Pt required mod/max A for sit to stand transfers and min A (+2 for chair follow/safety) for gait training. Continue to progress as tolerated with anticipated d/c to SNF for further skilled PT services.     Follow Up Recommendations  SNF;Supervision for mobility/OOB     Equipment Recommendations  Rolling walker with 5" wheels;Wheelchair (measurements PT);Wheelchair cushion (measurements PT);3in1 (PT)(bariatric wide BSC)    Recommendations for Other Services       Precautions / Restrictions Precautions Precautions: Fall Restrictions Weight Bearing Restrictions: Yes LLE Weight Bearing: Non weight bearing    Mobility  Bed Mobility Overal bed mobility: Modified Independent Bed Mobility: Rolling;Sidelying to Sit           General bed mobility comments: use of rails  Transfers Overall transfer level: Needs assistance Equipment used: Rolling walker (2 wheeled) Transfers: Sit to/from Stand Sit to Stand: Mod assist;Max assist         General transfer comment: cues for L LE positioning prior to stand; assist required to power up into standing and to gain balance upon standing; carry over of safe hand placement   Ambulation/Gait Ambulation/Gait assistance: Min assist;+2 safety/equipment Gait Distance (Feet): (12 ft X 3 trials with seated rest breaks) Assistive device: Rolling walker (2 wheeled) Gait Pattern/deviations: Step-to pattern Gait velocity: decreased   General Gait Details:  cues for breathing technique and posture   Stairs             Wheelchair Mobility    Modified Rankin (Stroke Patients Only)       Balance     Sitting balance-Leahy Scale: Good     Standing balance support: During functional activity;Bilateral upper extremity supported Standing balance-Leahy Scale: Poor                              Cognition Arousal/Alertness: Awake/alert Behavior During Therapy: WFL for tasks assessed/performed Overall Cognitive Status: Within Functional Limits for tasks assessed                                 General Comments: Highly motivated      Exercises      General Comments        Pertinent Vitals/Pain Pain Assessment: Faces Faces Pain Scale: Hurts little more Pain Location: R knee with ambulation Pain Descriptors / Indicators: Sore Pain Intervention(s): Limited activity within patient's tolerance;Monitored during session;Repositioned    Home Living                      Prior Function            PT Goals (current goals can now be found in the care plan section) Acute Rehab PT Goals Patient Stated Goal: to get stronger Progress towards PT goals: Progressing toward goals    Frequency    Min 3X/week      PT Plan Current plan  remains appropriate    Co-evaluation              AM-PAC PT "6 Clicks" Mobility   Outcome Measure  Help needed turning from your back to your side while in a flat bed without using bedrails?: A Little Help needed moving from lying on your back to sitting on the side of a flat bed without using bedrails?: A Little Help needed moving to and from a bed to a chair (including a wheelchair)?: A Lot Help needed standing up from a chair using your arms (e.g., wheelchair or bedside chair)?: A Lot Help needed to walk in hospital room?: A Lot Help needed climbing 3-5 steps with a railing? : Total 6 Click Score: 13    End of Session Equipment Utilized During  Treatment: Gait belt Activity Tolerance: Patient tolerated treatment well Patient left: with call bell/phone within reach;in chair Nurse Communication: Mobility status PT Visit Diagnosis: Muscle weakness (generalized) (M62.81);Difficulty in walking, not elsewhere classified (R26.2);Pain Pain - Right/Left: Left Pain - part of body: Ankle and joints of foot     Time: 5176-1607 PT Time Calculation (min) (ACUTE ONLY): 32 min  Charges:  $Gait Training: 23-37 mins                     Earney Navy, PTA Acute Rehabilitation Services Pager: [phone] Office: [phone]     Darliss Cheney 01/14/2019, 1:49 PM

## 2019-01-14 NOTE — Progress Notes (Signed)
CSW contacted [facility] for update on insurance authorization for patient.   They report insurance is still pending and that [place] has been taking a little longer. They are hopeful for insurance auth by end of day today or early tomorrow.   CSW will continue to follow up.   [place], [place]

## 2019-01-14 NOTE — Progress Notes (Signed)
PROGRESS NOTE  Leon Nelson WCB:[id number] DOB: [birth year] DOA: 01/07/2019 PCP: Biagio Borg, MD  HPI/Recap of past 24 hours: Leon Records Tuckeris a 63 y.o.malewith medical history significant fortype 2 diabetes mellitus, hypertension, hypothyroidism, and chronic bilateral foot and ankle pain status post triple left foot arthrodesis in the outpatient surgery center, now presenting to the emergency department with severe pain in the right buttock. Patient developed severe right buttock pain after his surgery, was treated with morphine, Valium, and Percocet at the surgery center, improved enough to go home. Had severe worsening of his pain at home, prompting his presentation to the ED. Denies right buttock pain prior to the surgery and denies any history of similar pain. Reports some paresthesia in the proximal right leg at the outpatient surgery center, but that has since resolved. He was unable to bear weight on the right leg, but this was secondary to pain rather than weakness.   Presented with elevated CPK greater than 9000.  Admitted for rhabdomyolysis.  Hospital course complicated by intermittent fevers with T-max of 100.6 which was thought to be secondary to atelectasis.  No leukocytosis and no sign of active infective process.  Was prophylactically started on 3 doses of clindamycin as recommended by podiatry.  No recurrences of fever.  Rhabdomyolysis is resolving.  Left lower extremity pain is improving.  01/12/2019: Patient seen and examined at his bedside.  No acute events overnight.  He has no new complaints.  Awaiting bed placement at SNF.  01/13/2019: Patient seen alongside to the nurse.  No new complaints.  Patient is awaiting insurance approval for skilled nursing facility placement.  01/14/2019: No new complaints.  Patient is awaiting disposition.  Patient is stable for discharge.   Assessment/Plan: Principal Problem:   Rhabdomyolysis Active Problems:   Hypothyroidism  Diabetes mellitus type 2 with neurological manifestations ([name])   Essential hypertension   SIRS (systemic inflammatory response syndrome) (HCC)   Elevated transaminase level  Rhabdomyolysis, improving  Presented with CPK greater than 9000 which continues to trend down to 725 on 01/12/19 Renal function, no issues Reduce rate of IV fluid to 50 cc/h of normal saline Repeat CPK in the morning Repeat BMP Monitor urine output Net -4.3 L since admission 01/13/2019: CPK is down to 357.  Status-post left foot surgery on 01/07/19  -Underwent triple arthrodesis left foot 01/07/19  -Continue postop management  -IV clindamycin x 3 doses prophylactically post operatively as recommended by podiatry -MRSA screening -01/13/2019: Stable.  Pursue disposition.  Resolving SIRS  No recurrent fever  No leukocytosis Patient instructed to continue to use incentive spirometer to avoid atelectasis -01/13/2019: Sirs has resolved.  Type II DM with hyperglycemia - A1c was 6.7% in November  - Managed at home with metformin and glipizide, held on admission  - Continue insulin sliding scale 01/13/2019: Continue to optimize.  Hypertension  - Blood pressure at goal  -Continue to hold atenolol and chlorthalidone 01/13/2019: Continue to optimize.  Hypothyroidism  - Continue Synthroid    Elevated transaminases  - Mild elevation in transaminases noted with normal t bili, benign exam, levels were normal in November 2019  - Likely secondary to rhabdomyolysis, will trend   01/13/2019: Will repeat LFTs tomorrow.  Ambulatory dysfunction post surgery PT assessed and recommended SNF CSW assisting with SNF placement   DVT prophylaxis: SQ Lovenox daily  Code Status: Full  Family Communication: Daughters at bedside Consults called: podiatry Disposition:  SNF possibly tomorrow pending insurance authorization     Objective: Vitals:  01/13/19 1340 01/13/19 2105 01/14/19 0334 01/14/19 1045  BP: 133/74  134/80 136/81 132/72  Pulse: 66 73 66 75  Resp: 17 18 20    Temp: 98 F (36.7 C) 98.5 F (36.9 C) 98.1 F (36.7 C)   TempSrc: Oral Oral Oral   SpO2: 99% 95% 97%   Weight:      Height:        Intake/Output Summary (Last 24 hours) at 01/14/2019 1539 Last data filed at 01/14/2019 1442 Gross per 24 hour  Intake 720 ml  Output 1175 ml  Net -455 ml   Filed Weights   01/10/19 0500 01/11/19 0500 01/12/19 0500  Weight: (!) 143.5 kg (!) 147.1 kg (!) 145 kg    Exam:  . General: Obese.  Not in any distress.  Patient is awake and alert. . Cardiovascular: Regular rate and rhythm with no rubs or gallops.   Marland Kitchen Respiratory: Clear to auscultation.   . Abdomen: Obese, soft and nontender.  Organs are difficult to assess.   . Musculoskeletal: Left lower extremity is in surgical wrap.  Data Reviewed: CBC: Recent Labs  Lab 01/08/19 0219 01/09/19 1051 01/10/19 0445 01/11/19 0614 01/12/19 0407 01/13/19 0318  WBC 13.1* 9.7 8.3 8.6 8.9 9.5  NEUTROABS 9.0* 6.9 5.1 5.2 5.3  --   HGB 12.6* 11.5* 11.5* 11.9* 12.1* 12.5*  HCT 37.8* 35.2* 34.9* 36.3* 37.0* 37.3*  MCV 88.7 88.2 88.1 88.1 88.7 88.6  PLT 240 151 151 193 228 734   Basic Metabolic Panel: Recent Labs  Lab 01/10/19 0445 01/11/19 0614 01/12/19 0407 01/13/19 0318 01/14/19 0222  NA 139 140 140 140 140  K 3.5 3.8 3.5 4.1 3.7  CL 103 105 101 102 100  CO2 27 27 27 30 31   GLUCOSE 195* 169* 156* 156* 160*  BUN 8 7* 8 10 10   CREATININE 0.51* 0.64 0.68 0.83 0.86  CALCIUM 8.2* 8.3* 8.2* 9.0 9.2   GFR: Estimated Creatinine Clearance: 128.4 mL/min (by C-G formula based on SCr of 0.86 mg/dL). Liver Function Tests: Recent Labs  Lab 01/07/19 2301 01/08/19 0219 01/14/19 0222  AST 142* 134* 39  ALT 62* 58* 48*  ALKPHOS 35* 34* 39  BILITOT 0.6 0.7 0.5  PROT 6.6 6.1* 6.4*  ALBUMIN 3.5 3.3* 2.9*   No results for input(s): LIPASE, AMYLASE in the last 168 hours. No results for input(s): AMMONIA in the last 168 hours. Coagulation  Profile: No results for input(s): INR, PROTIME in the last 168 hours. Cardiac Enzymes: Recent Labs  Lab 01/09/19 0547 01/10/19 0445 01/11/19 0614 01/12/19 0407 01/13/19 0318  CKTOTAL 2,103* 1,230* 764* 565* 357   BNP (last 3 results) No results for input(s): PROBNP in the last 8760 hours. HbA1C: No results for input(s): HGBA1C in the last 72 hours. CBG: Recent Labs  Lab 01/13/19 1113 01/13/19 1646 01/13/19 2129 01/14/19 0626 01/14/19 1120  GLUCAP 223* 123* 238* 154* 269*   Lipid Profile: No results for input(s): CHOL, HDL, LDLCALC, TRIG, CHOLHDL, LDLDIRECT in the last 72 hours. Thyroid Function Tests: No results for input(s): TSH, T4TOTAL, FREET4, T3FREE, THYROIDAB in the last 72 hours. Anemia Panel: No results for input(s): VITAMINB12, FOLATE, FERRITIN, TIBC, IRON, RETICCTPCT in the last 72 hours. Urine analysis:    Component Value Date/Time   COLORURINE YELLOW 01/07/2019 2331   APPEARANCEUR HAZY (A) 01/07/2019 2331   LABSPEC 1.018 01/07/2019 2331   PHURINE 5.0 01/07/2019 2331   GLUCOSEU NEGATIVE 01/07/2019 2331   GLUCOSEU NEGATIVE 10/27/2018 0838   HGBUR MODERATE (  A) 01/07/2019 2331   BILIRUBINUR NEGATIVE 01/07/2019 2331   KETONESUR NEGATIVE 01/07/2019 2331   PROTEINUR NEGATIVE 01/07/2019 2331   UROBILINOGEN 0.2 10/27/2018 0838   NITRITE NEGATIVE 01/07/2019 2331   LEUKOCYTESUR NEGATIVE 01/07/2019 2331   Sepsis Labs: @LABRCNTIP (procalcitonin:4,lacticidven:4)  ) Recent Results (from the past 240 hour(s))  Culture, blood (routine x 2)     Status: None   Collection Time: 01/09/19  7:34 PM  Result Value Ref Range Status   Specimen Description BLOOD LEFT HAND  Final   Special Requests   Final    BOTTLES DRAWN AEROBIC ONLY Blood Culture adequate volume   Culture   Final    NO GROWTH 5 DAYS Performed at [facility], [place] [address] [place], [place] [address]    Report Status 01/14/2019 FINAL  Final  Culture, blood (routine x 2)     Status: None    Collection Time: 01/09/19  7:34 PM  Result Value Ref Range Status   Specimen Description BLOOD RIGHT HAND  Final   Special Requests   Final    BOTTLES DRAWN AEROBIC ONLY Blood Culture results may not be optimal due to an inadequate volume of blood received in culture bottles   Culture   Final    NO GROWTH 5 DAYS Performed at [facility], [place] [address], [place], [place] [address]    Report Status 01/14/2019 FINAL  Final  MRSA PCR Screening     Status: None   Collection Time: 01/12/19  3:45 PM  Result Value Ref Range Status   MRSA by PCR NEGATIVE NEGATIVE Final    Comment:        The GeneXpert MRSA Assay (FDA approved for NASAL specimens only), is one component of a comprehensive MRSA colonization surveillance program. It is not intended to diagnose MRSA infection nor to guide or monitor treatment for MRSA infections. Performed at [facility], [place] [address], [place],  [address]       Studies: No results found.  Scheduled Meds: . allopurinol  300 mg Oral Daily  . atenolol  25 mg Oral Daily   And  . chlorthalidone  12.5 mg Oral Daily  . enoxaparin (LOVENOX) injection  70 mg Subcutaneous Q24H  . gabapentin  300 mg Oral QHS  . insulin aspart  0-5 Units Subcutaneous QHS  . insulin aspart  0-9 Units Subcutaneous TID WC  . levothyroxine  50 mcg Oral Q0600  . polyethylene glycol  17 g Oral Daily  . senna-docusate  2 tablet Oral BID  . venlafaxine XR  75 mg Oral Daily    Continuous Infusions: . sodium chloride 50 mL/hr at 01/12/19 0836     LOS: 5 days     Bonnell Public, MD [facility] Pager [phone]  If 7PM-7AM, please contact night-coverage [email or web address] Password [facility] 01/14/2019, 3:39 PM

## 2019-01-14 NOTE — Plan of Care (Signed)
Problem: Education: Goal: Knowledge of General Education information will improve Description Including pain rating scale, medication(s)/side effects and non-pharmacologic comfort measures Outcome: Progressing   Problem: Health Behavior/Discharge Planning: Goal: Ability to manage health-related needs will improve Outcome: Progressing   Problem: Clinical Measurements: Goal: Respiratory complications will improve Outcome: Progressing   Problem: Activity: Goal: Risk for activity intolerance will decrease Outcome: Progressing   Problem: Nutrition: Goal: Adequate nutrition will be maintained Outcome: Progressing   Problem: Elimination: Goal: Will not experience complications related to bowel motility Outcome: Progressing Goal: Will not experience complications related to urinary retention Outcome: Progressing   Problem: Pain Managment: Goal: General experience of comfort will improve Outcome: Progressing

## 2019-01-15 DIAGNOSIS — I10 Essential (primary) hypertension: Secondary | ICD-10-CM | POA: Diagnosis not present

## 2019-01-15 DIAGNOSIS — R531 Weakness: Secondary | ICD-10-CM | POA: Diagnosis not present

## 2019-01-15 DIAGNOSIS — K589 Irritable bowel syndrome without diarrhea: Secondary | ICD-10-CM | POA: Insufficient documentation

## 2019-01-15 DIAGNOSIS — R6510 Systemic inflammatory response syndrome (SIRS) of non-infectious origin without acute organ dysfunction: Secondary | ICD-10-CM | POA: Diagnosis not present

## 2019-01-15 DIAGNOSIS — M19072 Primary osteoarthritis, left ankle and foot: Secondary | ICD-10-CM | POA: Diagnosis not present

## 2019-01-15 DIAGNOSIS — M109 Gout, unspecified: Secondary | ICD-10-CM | POA: Diagnosis not present

## 2019-01-15 DIAGNOSIS — E039 Hypothyroidism, unspecified: Secondary | ICD-10-CM | POA: Diagnosis not present

## 2019-01-15 DIAGNOSIS — R52 Pain, unspecified: Secondary | ICD-10-CM | POA: Diagnosis not present

## 2019-01-15 DIAGNOSIS — T796XXA Traumatic ischemia of muscle, initial encounter: Secondary | ICD-10-CM | POA: Diagnosis not present

## 2019-01-15 DIAGNOSIS — M25571 Pain in right ankle and joints of right foot: Secondary | ICD-10-CM | POA: Diagnosis not present

## 2019-01-15 DIAGNOSIS — Z743 Need for continuous supervision: Secondary | ICD-10-CM | POA: Diagnosis not present

## 2019-01-15 DIAGNOSIS — F329 Major depressive disorder, single episode, unspecified: Secondary | ICD-10-CM | POA: Insufficient documentation

## 2019-01-15 DIAGNOSIS — E785 Hyperlipidemia, unspecified: Secondary | ICD-10-CM | POA: Diagnosis not present

## 2019-01-15 DIAGNOSIS — E1140 Type 2 diabetes mellitus with diabetic neuropathy, unspecified: Secondary | ICD-10-CM | POA: Diagnosis not present

## 2019-01-15 DIAGNOSIS — Z7984 Long term (current) use of oral hypoglycemic drugs: Secondary | ICD-10-CM | POA: Diagnosis not present

## 2019-01-15 DIAGNOSIS — R279 Unspecified lack of coordination: Secondary | ICD-10-CM | POA: Diagnosis not present

## 2019-01-15 DIAGNOSIS — M6282 Rhabdomyolysis: Secondary | ICD-10-CM | POA: Diagnosis not present

## 2019-01-15 DIAGNOSIS — Z981 Arthrodesis status: Secondary | ICD-10-CM | POA: Diagnosis not present

## 2019-01-15 LAB — GLUCOSE, CAPILLARY
Glucose-Capillary: 136 mg/dL — ABNORMAL HIGH (ref 70–99)
Glucose-Capillary: 189 mg/dL — ABNORMAL HIGH (ref 70–99)

## 2019-01-15 MED ORDER — SENNOSIDES-DOCUSATE SODIUM 8.6-50 MG PO TABS: 2.0000 | ORAL_TABLET | Freq: Two times a day (BID) | ORAL | 0 refills | Status: DC

## 2019-01-15 MED ORDER — ENOXAPARIN SODIUM 80 MG/0.8ML ~~LOC~~ SOLN: 70.0000 mg | SUBCUTANEOUS | 0 refills | Status: DC

## 2019-01-15 MED ORDER — ATENOLOL-CHLORTHALIDONE 50-25 MG PO TABS: 0.5000 | ORAL_TABLET | Freq: Every day | ORAL | 0 refills | Status: DC

## 2019-01-15 MED ORDER — BISACODYL 10 MG RE SUPP: 10.0000 mg | Freq: Every day | RECTAL | 0 refills | Status: DC | PRN

## 2019-01-15 MED ORDER — POLYETHYLENE GLYCOL 3350 17 G PO PACK: 17.0000 g | PACK | Freq: Every day | ORAL | 0 refills | Status: DC

## 2019-01-15 MED ORDER — VENLAFAXINE HCL ER 75 MG PO CP24: 75.0000 mg | ORAL_CAPSULE | Freq: Every day | ORAL | 0 refills | Status: DC

## 2019-01-15 MED ORDER — OXYCODONE-ACETAMINOPHEN 5-325 MG PO TABS: 1.0000 | ORAL_TABLET | ORAL | 0 refills | Status: DC | PRN

## 2019-01-15 MED ORDER — ALLOPURINOL 300 MG PO TABS: 300.0000 mg | ORAL_TABLET | Freq: Every day | ORAL | 0 refills | Status: DC

## 2019-01-15 NOTE — Plan of Care (Signed)
  Problem: Education: Goal: Knowledge of General Education information will improve Description Including pain rating scale, medication(s)/side effects and non-pharmacologic comfort measures Outcome: Progressing Note:  POC reviewed with pt. and pain management.   

## 2019-01-15 NOTE — Progress Notes (Signed)
AVS, printed prescription, and social worker's paperwork placed in discharge packet. Report called and given to [place], Therapist, sports at [employer]. All questions answered to satisfaction. Awaiting PTAR to transport pt off the unit.

## 2019-01-15 NOTE — Progress Notes (Signed)
Subjective: Mr. Winkowski is POD #7 s/p left foot triple arthrodesis.  Postoperatively he started developed right hip pain that was treated with morphine, Percocet and he continued have pain and therefore was given 5 mg of Valium.  His pain improved and he is able to move his right leg with significant improvement in symptoms.  He was discharged from outpatient surgery center as he was doing better and vital signs were stable.  On his way home he went to pick up his prescriptions and he started having worsening right hip pain.  Because of this he was directed to go to the emergency room.  He was admitted to the hospital overnight and was found to be in rhabdomyolysis.  Kidney function has been preserved however liver enzymes are elevated.  White blood cell count also elevated. Had a fever on admission but improved.  PT did see him and recommended SNF. He is awaiting placement  He is sitting up in a chair. He feels well and states that his pain is controlled.   Denies any fevers, chills, nausea, vomiting.  No calf pain, chest pain, shortness of breath.  Objective:  AAO x3, NAD Posterior splint and bandages are clean, dry, intact to the left lower extremity.  Small amount of drainage on the bandage distally from where the blister has drained.  The bandage was removed today.  Staples are intact to both incisions as well as the posterior heel incision.  There is minimal erythema around the incision but overall this is improved since I change the bandage last on Monday.  There is no drainage or pus.  No ascending cellulitis.  No fluctuation or crepitation.  Mild swelling to the ankle and still some swelling the dorsal aspect of the forefoot.  Still some mild residual blister formation on the dorsal forefoot proximal to the MPJs but there is no sign of erythema or other signs of infection.  There is no significant tenderness palpation to the surgical sites.  There is no pain with calf compression, swelling, warmth,  erythema.   Assessment: Postop day #7 status post left foot triple arthrodesis; rhabdomyolysis- improving  Plan: -All treatment options discussed with the patient including all alternatives, risks, complications.  -Dressing was changed today.  Betadine was painted over the incision followed by Xeroform and a dry sterile dressing.  A well-padded below-knee posterior splint was applied. -Lab work is normral and afebrile. He is doing much better and awaiting placement.  -Continue nonweightbearing.  He is currently waiting SNF placement.  -Continue ice elevate at all times. -Overall his pain is controlled.   -Continue to encourage incentive spirometer. -I will continue to follow him while inpatient.  If discharged tomorrow we will see him on Friday for dressing change.   Celesta Gentile, DPM O: [phone] C: [phone]

## 2019-01-15 NOTE — Clinical Social Work Placement (Signed)
   CLINICAL SOCIAL WORK PLACEMENT  NOTE  Date:  01/15/2019  Patient Details  Name: Leon Nelson MRN: [id number] Date of Birth: [birth year]  Clinical Social Work is seeking post-discharge placement for this patient at the [place] level of care (*CSW will initial, date and re-position this form in  chart as items are completed):      Patient/family provided with [facility] Work Department's list of facilities offering this level of care within the geographic area requested by the patient (or if unable, by the patient's family).  Yes   Patient/family informed of their freedom to choose among providers that offer the needed level of care, that participate in Medicare, Medicaid or managed care program needed by the patient, have an available bed and are willing to accept the patient.      Patient/family informed of [facility]'s ownership interest in [facility] and [facility], as well as of the fact that they are under no obligation to receive care at these facilities.  PASRR submitted to EDS on       PASRR number received on 01/10/19     Existing PASRR number confirmed on       FL2 transmitted to all facilities in geographic area requested by pt/family on 01/10/19     FL2 transmitted to all facilities within larger geographic area on 01/10/19     Patient informed that his/her managed care company has contracts with or will negotiate with certain facilities, including the following:        Yes   Patient/family informed of bed offers received.  Patient chooses bed at Other - please specify in the comment section below:([facility])     Physician recommends and patient chooses bed at      Patient to be transferred to Other - please specify in the comment section below:([facility]) on 01/15/19.  Patient to be transferred to facility by PTAR     Patient family notified on 01/15/19 of transfer.  Name of family member notified:  Anderson [place]  (spouse)     PHYSICIAN       Additional Comment:    _______________________________________________ Alberteen Sam, LCSW 01/15/2019, 12:55 PM

## 2019-01-15 NOTE — Progress Notes (Addendum)
Patient will DC to: [facility] Anticipated DC date: 01/15/2019 Family notified: [facility] Transport by: Corey Harold  Per MD patient ready for DC to [facility] . RN, patient, patient's family, and facility notified of DC. Discharge Summary sent to facility. RN given number for report [phone] ask for [phone] hall nurse. DC packet on chart. Ambulance transport requested for patient.  CSW signing off.  [place], [place]

## 2019-01-15 NOTE — Plan of Care (Signed)
°  Problem: Education: °Goal: Knowledge of General Education information will improve °Description: Including pain rating scale, medication(s)/side effects and non-pharmacologic comfort measures °Outcome: Progressing °  °Problem: Health Behavior/Discharge Planning: °Goal: Ability to manage health-related needs will improve °Outcome: Progressing °  °Problem: Clinical Measurements: °Goal: Ability to maintain clinical measurements within normal limits will improve °Outcome: Progressing °Goal: Will remain free from infection °Outcome: Progressing °  °Problem: Activity: °Goal: Risk for activity intolerance will decrease °Outcome: Progressing °  °Problem: Nutrition: °Goal: Adequate nutrition will be maintained °Outcome: Progressing °  °Problem: Elimination: °Goal: Will not experience complications related to bowel motility °Outcome: Progressing °Goal: Will not experience complications related to urinary retention °Outcome: Progressing °  °Problem: Pain Managment: °Goal: General experience of comfort will improve °Outcome: Progressing °  °

## 2019-01-15 NOTE — Discharge Summary (Signed)
Physician Discharge Summary  Patient ID: Leon Nelson MRN: [id number] DOB/AGE: [birth year] 63 y.o.  Admit date: 01/07/2019 Discharge date: 01/15/2019  Admission Diagnoses:  Discharge Diagnoses:  Principal Problem:   Rhabdomyolysis Active Problems:   Hypothyroidism   Diabetes mellitus type 2 with neurological manifestations ([place])   Essential hypertension   SIRS (systemic inflammatory response syndrome) (HCC)   Elevated transaminase level   Discharged Condition: stable  Hospital Course:  Leon L Tuckeris 63-year-old male, with past medical history significant for type 2 diabetes mellitus, hypertension, hypothyroidism, and chronic bilateral foot and ankle pain status post triple left foot arthrodesis in the outpatient surgery center.  Patient presented to the hospital with severe pain in the right buttock. Patient developed severe right buttock pain after his surgery, was treated with morphine, Valium, and Percocet at the surgery center, and patient improved enough to go home.  While at home, patient's pain worsened significantly, prompting his presentation to the ED.  Patient reported some paresthesia in the proximal right leg at the outpatient surgery center, but that has since resolved. Patient was unable to bear weight on the right leg, but this was secondary to pain rather than weakness.  On presentation to the hospital, patient was noted to have CPK of 9699.  Patient was admitted for further assessment and management of rhabdomyolysis.  Patient was treated with aggressive fluid therapy.  CPK was monitored during the hospital stay.  His CPK is down to 357.  Electrolytes, renal function and liver function were monitored during the hospital stay.  Patient will be discharged to the care of the primary care provider and the orthopedic team.  Rhabdomyolysis, improving Presented with CPK greater than 9000 which has trended down to 357.  Normal renal function is noted. Continue to monitor  on discharge. Statin is on hold.  Status-post left foot surgery on 01/07/2019  -Underwent triple arthrodesis left foot: 01/07/2019  -Continue postop management  -IV clindamycin x 3 doses prophylactically post operatively as recommended by podiatry  Resolving SIRS No recurrent fever  No leukocytosis Patient instructed to continue to use incentive spirometer to avoid atelectasis  Type II DMwith hyperglycemia -A1c was 6.7% in November -Managed at home with metformin and glipizide -Continue home medication on discharge.  Hypertension -Blood pressure at goal  -Continue to monitor and optimize.    Hypothyroidism -Continue Synthroid  Elevated transaminases -Mild elevation in transaminases noted with normal total bilirubin.   -This is likely secondary to rhabdomyolysis. -LFTs were monitored during the hospital stay.  Ambulatory dysfunction post surgery PT assessed and recommended SNF CSW assisting with SNF placement  Consults: orthopedic surgery  Significant Diagnostic Studies:  CPK was 9,699 on admission.  To discharge, CPK was down to 357.  Discharge Exam: Blood pressure 124/67, pulse 64, temperature 98.7 F (37.1 C), temperature source Oral, resp. rate 16, height 6' (1.829 m), weight (!) 145 kg, SpO2 95 %.   Disposition: Discharge disposition: 03-Skilled Nursing Facility   Discharge Instructions    Diet - low sodium heart healthy   Complete by:  As directed    Increase activity slowly   Complete by:  As directed      Allergies as of 01/15/2019      Reactions   Penicillins Other (See Comments)   Blisters on hands   Sulfonamide Derivatives Other (See Comments)   Blisters in groin area      Medication List    STOP taking these medications   clindamycin 300 MG capsule Commonly known as:  CLEOCIN   diazepam 5 MG tablet Commonly known as:  VALIUM   lisinopril 20 MG tablet Commonly known as:  PRINIVIL,ZESTRIL   oxyCODONE-acetaminophen  10-325 MG tablet Commonly known as:  PERCOCET Replaced by:  oxyCODONE-acetaminophen 5-325 MG tablet   potassium chloride SA 20 MEQ tablet Commonly known as:  K-DUR,KLOR-CON   promethazine 25 MG tablet Commonly known as:  PHENERGAN   simvastatin 80 MG tablet Commonly known as:  ZOCOR   Vitamin D (Ergocalciferol) 1.25 MG (50000 UT) Caps capsule Commonly known as:  DRISDOL     TAKE these medications   allopurinol 300 MG tablet Commonly known as:  ZYLOPRIM Take 1 tablet (300 mg total) by mouth daily.   atenolol-chlorthalidone 50-25 MG tablet Commonly known as:  TENORETIC Take 0.5 tablets by mouth daily. TAKE ONE-HALF (1/2) TABLET DAILY   bisacodyl 10 MG suppository Commonly known as:  DULCOLAX Place 1 suppository (10 mg total) rectally daily as needed for moderate constipation.   diclofenac sodium 1 % Gel Commonly known as:  VOLTAREN Apply 2 g topically 4 (four) times daily as needed. What changed:  reasons to take this   dicyclomine 20 MG tablet Commonly known as:  BENTYL TAKE 1 TABLET BY MOUTH THREE TIMES DAILY AS NEEDED What changed:  when to take this   enoxaparin 80 MG/0.8ML injection Commonly known as:  LOVENOX Inject 0.7 mLs (70 mg total) into the skin daily. What changed:    medication strength  how much to take   gabapentin 300 MG capsule Commonly known as:  NEURONTIN TAKE 1 CAPSULE BY MOUTH EVERYDAY AT BEDTIME What changed:  See the new instructions.   glipiZIDE 10 MG 24 hr tablet Commonly known as:  GLUCOTROL XL TAKE 1 TABLET DAILY WITH BREAKFAST What changed:  when to take this   levothyroxine 50 MCG tablet Commonly known as:  SYNTHROID, LEVOTHROID Take 1 tablet (50 mcg total) by mouth daily.   metFORMIN 500 MG 24 hr tablet Commonly known as:  GLUCOPHAGE-XR TAKE 3 TABLETS IN THE MORNING What changed:  See the new instructions.   oxyCODONE-acetaminophen 5-325 MG tablet Commonly known as:  PERCOCET/ROXICET Take 1 tablet by mouth every 4  (four) hours as needed for moderate pain. Replaces:  oxyCODONE-acetaminophen 10-325 MG tablet   polyethylene glycol packet Commonly known as:  MIRALAX / GLYCOLAX Take 17 g by mouth daily. Start taking on:  January 16, 2019   senna-docusate 8.6-50 MG tablet Commonly known as:  Senokot-S Take 2 tablets by mouth 2 (two) times daily.   sildenafil 100 MG tablet Commonly known as:  VIAGRA Take 0.5-1 tablets (50-100 mg total) by mouth daily as needed. What changed:  reasons to take this   venlafaxine XR 75 MG 24 hr capsule Commonly known as:  EFFEXOR-XR Take 1 capsule (75 mg total) by mouth daily.            Durable Medical Equipment  (From admission, onward)         Start     Ordered   01/10/19 1634  For home use only DME Bedside commode  Once    Comments:  Bariatric size  Question:  Patient needs a bedside commode to treat with the following condition  Answer:  Ambulatory dysfunction   01/10/19 1634           Signed: Bonnell Public 01/15/2019, 11:51 AM

## 2019-01-16 DIAGNOSIS — M25571 Pain in right ankle and joints of right foot: Secondary | ICD-10-CM | POA: Diagnosis not present

## 2019-01-20 ENCOUNTER — Ambulatory Visit (INDEPENDENT_AMBULATORY_CARE_PROVIDER_SITE_OTHER): Payer: BLUE CROSS/BLUE SHIELD

## 2019-01-20 ENCOUNTER — Ambulatory Visit (INDEPENDENT_AMBULATORY_CARE_PROVIDER_SITE_OTHER): Payer: BLUE CROSS/BLUE SHIELD | Admitting: Podiatry

## 2019-01-20 VITALS — Temp 98.7°F

## 2019-01-20 DIAGNOSIS — M19072 Primary osteoarthritis, left ankle and foot: Secondary | ICD-10-CM

## 2019-01-20 DIAGNOSIS — Z09 Encounter for follow-up examination after completed treatment for conditions other than malignant neoplasm: Secondary | ICD-10-CM

## 2019-01-20 NOTE — Progress Notes (Signed)
Subjective: Leon Nelson is a 63 y.o. is seen today in office s/p left foot triple arthrodesis preformed on 7/93/9030 thus complicated with postop rhabdomyolysis.  Overall he states he is doing well and his pain is controlled.  He is getting Lovenox injections at the facility.  He states that his strength overall is coming back.  Denies any systemic complaints such as fevers, chills, nausea, vomiting. No calf pain, chest pain, shortness of breath.   Objective: General: No acute distress, AAOx3  DP/PT pulses palpable 2/4, CRT < 3 sec to all digits.  Protective sensation intact. Motor function intact.  LEFT foot: Incision is well coapted without any evidence of dehiscence and staples are intact without any dehiscence.. There is no surrounding erythema, ascending cellulitis, fluctuance, crepitus, malodor, drainage/purulence. There is mild but improved edema around the surgical site. There is minimal to no pain along the surgical site.  Area on the dorsal aspect of the foot where he had the blisters is starting to dry and there is no fluctuation crepitation or any new blister formation present. No other areas of tenderness to bilateral lower extremities.  No other open lesions or pre-ulcerative lesions.  No pain with calf compression, swelling, warmth, erythema.   Assessment and Plan:  Status post left foot surgery, doing well at this time with no complications   -Treatment options discussed including all alternatives, risks, and complications -X-rays were obtained reviewed.  The x-ray views were difficult to get today given his weakness.  However it appears that the hardware is intact. -Antibiotic ointment and a bandage was applied to the incisions followed by dry sterile dressing.  A well-padded below-knee fiberglass cast was applied making sure to pad all bony prominences. -Ice/elevation -Pain medication as needed. -Continue Lovenox injections -Monitor for any clinical signs or symptoms of  infection and DVT/PE and directed to call the office immediately should any occur or go to the ER. -Follow-up in 1 week for cast change.  We will leave the staples intact.  Or sooner if any problems arise. In the meantime, encouraged to call the office with any questions, concerns, change in symptoms.   Celesta Gentile, DPM

## 2019-01-27 ENCOUNTER — Ambulatory Visit (INDEPENDENT_AMBULATORY_CARE_PROVIDER_SITE_OTHER): Payer: BLUE CROSS/BLUE SHIELD | Admitting: Sports Medicine

## 2019-01-27 ENCOUNTER — Telehealth: Payer: Self-pay | Admitting: *Deleted

## 2019-01-27 VITALS — BP 117/73 | HR 62 | Temp 97.7°F

## 2019-01-27 DIAGNOSIS — M19072 Primary osteoarthritis, left ankle and foot: Secondary | ICD-10-CM | POA: Diagnosis not present

## 2019-01-27 DIAGNOSIS — Z09 Encounter for follow-up examination after completed treatment for conditions other than malignant neoplasm: Secondary | ICD-10-CM

## 2019-01-27 DIAGNOSIS — M19079 Primary osteoarthritis, unspecified ankle and foot: Secondary | ICD-10-CM

## 2019-01-27 NOTE — Progress Notes (Signed)
Subjective: Leon Nelson is a 63 y.o. male patient seen today in office for POV dos 01.15.2020 Triple Arthrodesis Left with Dr. Jacqualyn Posey. Patient denies pain at surgical site or any issues with cast, denies calf pain, denies headache, chest pain, shortness of breath, nausea, vomiting, fever, or chills. Patient states that he is still at rehab and they are helping with giving Lovenox injections. No other issues noted.   Patient Active Problem List   Diagnosis Date Noted  . Rhabdomyolysis 01/08/2019  . SIRS (systemic inflammatory response syndrome) ([place]) 01/08/2019  . Elevated transaminase level 01/08/2019  . Chronic pain of left ankle 11/01/2018  . Acute gouty arthritis 04/17/2018  . Left cervical radiculopathy 01/27/2018  . Bilateral shoulder pain 01/27/2018  . Left hand paresthesia 01/27/2018  . Hypokalemia 03/27/2017  . Posterior neck pain 09/13/2015  . Heart murmur previously undiagnosed 08/14/2011  . Preventative health care 08/12/2011  . CHEST PAIN 02/06/2011  . ABDOMINAL PAIN OTHER SPECIFIED SITE 07/20/2008  . KNEE PAIN, RIGHT, ACUTE 07/01/2008  . Hypothyroidism 05/26/2008  . Diabetes mellitus type 2 with neurological manifestations ([place]) 05/26/2008  . Hyperlipidemia 05/26/2008  . Depression 05/26/2008  . Essential hypertension 05/26/2008  . History of colonic polyps 05/26/2008  . UTI'S, HX OF 05/26/2008  . Personal history of urinary calculi 05/26/2008    Current Outpatient Medications on File Prior to Visit  Medication Sig Dispense Refill  . allopurinol (ZYLOPRIM) 300 MG tablet Take 1 tablet (300 mg total) by mouth daily. 30 tablet 0  . atenolol-chlorthalidone (TENORETIC) 50-25 MG tablet Take 0.5 tablets by mouth daily. TAKE ONE-HALF (1/2) TABLET DAILY 30 tablet 0  . bisacodyl (DULCOLAX) 10 MG suppository Place 1 suppository (10 mg total) rectally daily as needed for moderate constipation. 12 suppository 0  . diclofenac sodium (VOLTAREN) 1 % GEL Apply 2 g topically 4  (four) times daily as needed. (Patient taking differently: Apply 2 g topically 4 (four) times daily as needed (pain). ) 200 g 5  . dicyclomine (BENTYL) 20 MG tablet TAKE 1 TABLET BY MOUTH THREE TIMES DAILY AS NEEDED (Patient taking differently: Take 20 mg by mouth daily. ) 90 tablet 3  . enoxaparin (LOVENOX) 80 MG/0.8ML injection Inject 0.7 mLs (70 mg total) into the skin daily. 24 mL 0  . gabapentin (NEURONTIN) 300 MG capsule TAKE 1 CAPSULE BY MOUTH EVERYDAY AT BEDTIME (Patient taking differently: Take 300 mg by mouth at bedtime. ) 30 capsule 3  . glipiZIDE (GLUCOTROL XL) 10 MG 24 hr tablet TAKE 1 TABLET DAILY WITH BREAKFAST (Patient taking differently: Take 10 mg by mouth daily. ) 90 tablet 3  . levothyroxine (SYNTHROID, LEVOTHROID) 50 MCG tablet Take 1 tablet (50 mcg total) by mouth daily. 7 tablet 0  . metFORMIN (GLUCOPHAGE-XR) 500 MG 24 hr tablet TAKE 3 TABLETS IN THE MORNING (Patient taking differently: Take 1,500 mg by mouth daily. ) 270 tablet 1  . oxyCODONE-acetaminophen (PERCOCET/ROXICET) 5-325 MG tablet Take 1 tablet by mouth every 4 (four) hours as needed for moderate pain. 30 tablet 0  . polyethylene glycol (MIRALAX / GLYCOLAX) packet Take 17 g by mouth daily. 14 each 0  . senna-docusate (SENOKOT-S) 8.6-50 MG tablet Take 2 tablets by mouth 2 (two) times daily. 120 tablet 0  . sildenafil (VIAGRA) 100 MG tablet Take 0.5-1 tablets (50-100 mg total) by mouth daily as needed. (Patient taking differently: Take 50-100 mg by mouth daily as needed for erectile dysfunction. ) 10 tablet 0  . venlafaxine XR (EFFEXOR-XR) 75 MG 24  hr capsule Take 1 capsule (75 mg total) by mouth daily. 30 capsule 0   No current facility-administered medications on file prior to visit.     Allergies  Allergen Reactions  . Penicillins Other (See Comments)    Blisters on hands  . Sulfonamide Derivatives Other (See Comments)    Blisters in groin area    Objective: There were no vitals filed for this  visit.  General: No acute distress, AAOx3  Left foot: staples intact with no gapping or dehiscence at surgical sites, mild swelling to left foot/ankle, Dry blood blister at top of left foot just proximal to bases of toes 2-4 , no erythema, no warmth, no drainage, no signs of infection noted, Capillary fill time <3 seconds in all digits, gross sensation present via light touch to left foot.Mild guarding and weakness to left lower extremity.  No pain with calf compression.   Assessment and Plan:  Problem List Items Addressed This Visit    None    Visit Diagnoses    Osteoarthritis of ankle and foot, unspecified laterality    -  Primary   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       Surgery follow-up           -Patient seen and evaluated -Staples left intact -Antibiotic cream was applied to surgical sites and to dry blood blister at the top of the left foot.  A well-padded below-knee fiberglass cast was then applied to the left lower extremity. -Advised patient to continue with DVT prophylaxis with Lovenox and pain medication as needed.  Continue with rest ice elevation. -Continue with physical therapy at rehab center to patient's tolerance -Advised patient to call office there are any acute problems or symptoms or to go to ER immediately -Will plan for postop follow-up with Dr. Audry Pili at next office visit. In the meantime, patient to call office if any issues or problems arise.   Landis Martins, DPM

## 2019-01-27 NOTE — Telephone Encounter (Signed)
Pt request wheelchair, walker with knee rest, and bedside commode for post op use. Faxed to [facility] and emailed to [employer].

## 2019-01-28 DIAGNOSIS — E039 Hypothyroidism, unspecified: Secondary | ICD-10-CM | POA: Diagnosis not present

## 2019-01-28 DIAGNOSIS — M6282 Rhabdomyolysis: Secondary | ICD-10-CM | POA: Diagnosis not present

## 2019-01-28 DIAGNOSIS — E1142 Type 2 diabetes mellitus with diabetic polyneuropathy: Secondary | ICD-10-CM | POA: Diagnosis not present

## 2019-01-28 DIAGNOSIS — R6510 Systemic inflammatory response syndrome (SIRS) of non-infectious origin without acute organ dysfunction: Secondary | ICD-10-CM | POA: Diagnosis not present

## 2019-01-30 DIAGNOSIS — M25571 Pain in right ankle and joints of right foot: Secondary | ICD-10-CM | POA: Diagnosis not present

## 2019-01-30 DIAGNOSIS — G8929 Other chronic pain: Secondary | ICD-10-CM | POA: Diagnosis not present

## 2019-01-30 DIAGNOSIS — M19079 Primary osteoarthritis, unspecified ankle and foot: Secondary | ICD-10-CM | POA: Diagnosis not present

## 2019-02-02 ENCOUNTER — Telehealth: Payer: Self-pay | Admitting: Podiatry

## 2019-02-02 ENCOUNTER — Telehealth: Payer: Self-pay | Admitting: Internal Medicine

## 2019-02-02 NOTE — Telephone Encounter (Signed)
Emailed notice to [employer] to contact pt concerning his equipment.

## 2019-02-02 NOTE — Telephone Encounter (Signed)
I was in last Tuesday and provided a list of things the rehab facility said I needed which included a walker, a wheelchair, and a bedside toilet. They put the order in but they put in for bariatric, but they are too big. Well they said they didn't get the order for the walker which is something I have to have before I can go home. Also, can the bedside toilet because this one will not fit in my bathroom at home. If you could please call me back.

## 2019-02-02 NOTE — Telephone Encounter (Signed)
Copied from [facility] [phone]. Topic: Quick Communication - See Telephone Encounter >> Feb 02, 2019  4:11 PM Blase Mess A wrote: CRM for notification. See Telephone encounter for: 02/02/19.  Patient was hospitalized and in rehab Suzan nurse case manager with [facility] is calling to notify. Patient is in [place] [phone] x [phone]

## 2019-02-03 ENCOUNTER — Ambulatory Visit (INDEPENDENT_AMBULATORY_CARE_PROVIDER_SITE_OTHER): Payer: BLUE CROSS/BLUE SHIELD | Admitting: Podiatry

## 2019-02-03 DIAGNOSIS — E559 Vitamin D deficiency, unspecified: Secondary | ICD-10-CM

## 2019-02-03 DIAGNOSIS — M19079 Primary osteoarthritis, unspecified ankle and foot: Secondary | ICD-10-CM

## 2019-02-03 DIAGNOSIS — M6282 Rhabdomyolysis: Secondary | ICD-10-CM

## 2019-02-03 DIAGNOSIS — M19072 Primary osteoarthritis, left ankle and foot: Secondary | ICD-10-CM

## 2019-02-04 ENCOUNTER — Encounter: Payer: Self-pay | Admitting: Podiatry

## 2019-02-04 ENCOUNTER — Telehealth: Payer: Self-pay | Admitting: Podiatry

## 2019-02-04 DIAGNOSIS — M19079 Primary osteoarthritis, unspecified ankle and foot: Secondary | ICD-10-CM

## 2019-02-04 DIAGNOSIS — M6282 Rhabdomyolysis: Secondary | ICD-10-CM | POA: Diagnosis not present

## 2019-02-04 DIAGNOSIS — E559 Vitamin D deficiency, unspecified: Secondary | ICD-10-CM | POA: Diagnosis not present

## 2019-02-04 DIAGNOSIS — Z09 Encounter for follow-up examination after completed treatment for conditions other than malignant neoplasm: Secondary | ICD-10-CM

## 2019-02-04 NOTE — Addendum Note (Signed)
Addended by: Harriett Sine D on: 02/04/2019 02:45 PM   Modules accepted: Orders

## 2019-02-04 NOTE — Telephone Encounter (Signed)
Misty with [facility] called requesting office visit notes from 11 February faxed to [place] [phone] or you can call my cell at [phone] if you have any questions.

## 2019-02-04 NOTE — Telephone Encounter (Signed)
I informed pt, I had received notification from [facility] pt would need to have clinical reports of Height and Weight. Pt states he would have Rehab center fax to [facility] [phone] and [facility] and [facility] [phone].

## 2019-02-04 NOTE — Telephone Encounter (Signed)
I need to speak to the nurse because I still have not gotten all of the equipment sent to my home and I'm supposed to be leaving rehab on Saturday. Please call me back.

## 2019-02-04 NOTE — Telephone Encounter (Addendum)
I spoke with pt he states he weighs 295lbs and does not need the wheelchair with the 24" seat, he needs a 18"seat and a commode that goes over the house commode. Pt states he purchased a walker with a knee rest. I told pt [facility] had said the bariatric wheelchair for 300lbs was insurance requirement for his weight. I told pt I would fax orders for the commode and wheelchair again to [facility] and email their DME agent. Faxed and emailed to [employer] orders for wheelchair for pt weighing 295lb, to include ELRs, cushion, non-tippers and brake extensions, and a commode that will fit over the pt's home commode.

## 2019-02-05 NOTE — Progress Notes (Signed)
Subjective: Leon Nelson is a 63 y.o. is seen today in office s/p left foot triple arthrodesis preformed on 2/87/6811 thus complicated with postop rhabdomyolysis. He states that he is doing well and his pain is controlled. He is still in the rehab facility but hoping to be discharged on Saturday.  We are trying to get him the DME needs.  He denies any fevers, chills, nausea, vomiting.  Denies any calf pain, chest pain, shortness of breath.  Overall his weakness is much improved he states.  He has no other concerns.  Objective: General: No acute distress, AAOx3  DP/PT pulses palpable 2/4, CRT < 3 sec to all digits.  Protective sensation intact. Motor function intact.  LEFT foot: Incision is well coapted without any evidence of dehiscence and staples are intact without any dehiscence.  There is no surrounding erythema, ascending cellulitis.  There is no fluctuation or crepitation malodor there is no clinical signs of infection noted today.  There were the blisters that were present on the dorsal aspect the forefoot have scabbed over.  There is no new blister formation present.  There is no tenderness palpation of the surgical site identified today and there is no pain with ankle joint range of motion is no pain with ankle joint ankle gutters. No other open lesions or pre-ulcerative lesions.  No pain with calf compression, swelling, warmth, erythema.   Assessment and Plan:  Status post left foot surgery, doing well at this time with no complications   -Treatment options discussed including all alternatives, risks, and complications -Overall incision appears to be doing well.  I removed every other staple today.  Antibiotic ointment and a bandage was applied to the incisions followed by dry sterile dressing.  A well-padded below-knee fiberglass cast was applied making sure to pad all bony prominences.  Continue with nonweightbearing for now.  We are working on getting him his DME needs as provided  recommendations by the rehab facility.  Patient to continue Lovenox as well. Will also continue vitamin D morning recheck this.  Order was given.  Also we will check a CMP.  Trula Slade DPM   -X-rays were obtained reviewed.  The x-ray views were difficult to get today given his weakness.  However it appears that the hardware is intact. -Antibiotic ointment and a bandage was applied to the incisions followed by dry sterile dressing.  A well-padded below-knee fiberglass cast was applied making sure to pad all bony prominences. -Ice/elevation -Pain medication as needed. -Continue Lovenox injections -Monitor for any clinical signs or symptoms of infection and DVT/PE and directed to call the office immediately should any occur or go to the ER. -Follow-up in 1 week for cast change.  We will leave the staples intact.  Or sooner if any problems arise. In the meantime, encouraged to call the office with any questions, concerns, change in symptoms.   Celesta Gentile, DPM

## 2019-02-06 DIAGNOSIS — M19072 Primary osteoarthritis, left ankle and foot: Secondary | ICD-10-CM | POA: Insufficient documentation

## 2019-02-08 DIAGNOSIS — R740 Nonspecific elevation of levels of transaminase and lactic acid dehydrogenase [LDH]: Secondary | ICD-10-CM | POA: Diagnosis not present

## 2019-02-08 DIAGNOSIS — R6510 Systemic inflammatory response syndrome (SIRS) of non-infectious origin without acute organ dysfunction: Secondary | ICD-10-CM | POA: Diagnosis not present

## 2019-02-08 DIAGNOSIS — I10 Essential (primary) hypertension: Secondary | ICD-10-CM | POA: Diagnosis not present

## 2019-02-08 DIAGNOSIS — E1142 Type 2 diabetes mellitus with diabetic polyneuropathy: Secondary | ICD-10-CM | POA: Diagnosis not present

## 2019-02-09 ENCOUNTER — Other Ambulatory Visit: Payer: Self-pay | Admitting: Podiatry

## 2019-02-10 ENCOUNTER — Ambulatory Visit (INDEPENDENT_AMBULATORY_CARE_PROVIDER_SITE_OTHER): Payer: BLUE CROSS/BLUE SHIELD | Admitting: Podiatry

## 2019-02-10 ENCOUNTER — Telehealth: Payer: Self-pay | Admitting: Podiatry

## 2019-02-10 VITALS — Temp 99.4°F

## 2019-02-10 DIAGNOSIS — Z09 Encounter for follow-up examination after completed treatment for conditions other than malignant neoplasm: Secondary | ICD-10-CM

## 2019-02-10 DIAGNOSIS — M19079 Primary osteoarthritis, unspecified ankle and foot: Secondary | ICD-10-CM | POA: Diagnosis not present

## 2019-02-10 NOTE — Telephone Encounter (Signed)
[facility] medical records department called saying they do not have any labs on Mr. Richison nor do they see any orders for labs in his chart. Stated if we had any other questions/concerns to call back at [phone].

## 2019-02-10 NOTE — Telephone Encounter (Signed)
Called Claudia back and gave her the fax number of [phone] to fax Mr. Caselli labs to [place].

## 2019-02-10 NOTE — Telephone Encounter (Signed)
Rosemarie Ax called back from [facility] called back saying there was no labs or lab orders on Leon Nelson.

## 2019-02-10 NOTE — Telephone Encounter (Signed)
Called and left a message on the medical records department voicemail to fax over patients lab results. Asked them to be faxed to [phone] and to call me directly at [phone] with any questions.

## 2019-02-10 NOTE — Telephone Encounter (Signed)
Claudia with [facility] called back saying the second shift nurse found Mr. Melhorn labs, that they had not been linked into their system. Requested our fax number to fax them to [place].

## 2019-02-10 NOTE — Telephone Encounter (Signed)
[facility] department stated they did not see anything under labs or orders. Stated she would have a nurse look behind her and then call me back.

## 2019-02-11 ENCOUNTER — Telehealth: Payer: Self-pay | Admitting: *Deleted

## 2019-02-11 NOTE — Telephone Encounter (Signed)
No. I'm about to call Leon Nelson back to follow up with her and have her fax those to me. Once I get them, I'll place them in your dictation office.

## 2019-02-11 NOTE — Telephone Encounter (Signed)
Dr. Jacqualyn Posey states pt's vitamin D is low and the Drisdol has been called to the pharmacy, his liver function is high but lower than in the hospital and he should follow up with his PCP. Faxed [facility] of 02/08/2019.

## 2019-02-11 NOTE — Progress Notes (Signed)
Subjective: Leon Nelson is a 63 y.o. is seen today in office s/p left foot triple arthrodesis preformed on 0/92/3300 thus complicated with postop rhabdomyolysis.  Patient presents today for cast change and possible suture, staple removal.  Overall he states that he is doing well and is having no pain and not taking any pain medication.  He is back at home now and is feeling well.  He has been off of his foot.  He is continue to ice and elevate.  He denies any fevers, chills, nausea, vomiting.  No calf pain, chest pain, shortness of breath.  He did get blood work he states performed at the rehab facility but never received the results.  Try to contact them and they state that they do not have any results.  We will continue to follow-up on this.  Objective: General: No acute distress, AAOx3  DP/PT pulses palpable 2/4, CRT < 3 sec to all digits.  Protective sensation intact. Motor function intact.  LEFT foot: Incision is well coapted without any evidence of dehiscence and staples are intact without any dehiscence.  There is no tenderness palpation the surgical site there is no tenderness identified of the foot.  There is no pain with ankle joint.  There is decrease edema there is no erythema or increase in warmth.  There is no clinical signs of infection.  Overall he is doing well.  The area of the blisters have resolved on the dorsal foot. No other open lesions or pre-ulcerative lesions.  No pain with calf compression, swelling, warmth, erythema.   Assessment and Plan:  Status post left foot surgery, doing well at this time with no complications   -Treatment options discussed including all alternatives, risks, and complications -If you have the staples were removed today with any complications.  There is still some mild motion across the incision and some swelling still of the remainder of the staples intact.  Antibiotic ointment and a bandage was applied.  A well-padded below-knee fiberglass cast  was applied making sure to pad all bony prominences.  Continue nonweightbearing.  I will follow-up on the blood work for him as well.  Continue to monitor any signs or symptoms of infection and DVT/PE.  Trula Slade DPM

## 2019-02-11 NOTE — Telephone Encounter (Signed)
Called and left a second message for Rosemarie Ax to fax Mr. Mozley labs to me at either [phone] or [phone]. Told her to call me if she had any questions.

## 2019-02-11 NOTE — Telephone Encounter (Signed)
Leon Nelson, have you received the results yet? Just wanted to follow up.

## 2019-02-16 ENCOUNTER — Ambulatory Visit (INDEPENDENT_AMBULATORY_CARE_PROVIDER_SITE_OTHER): Payer: BLUE CROSS/BLUE SHIELD | Admitting: Internal Medicine

## 2019-02-16 ENCOUNTER — Other Ambulatory Visit (INDEPENDENT_AMBULATORY_CARE_PROVIDER_SITE_OTHER): Payer: BLUE CROSS/BLUE SHIELD

## 2019-02-16 ENCOUNTER — Encounter: Payer: Self-pay | Admitting: Internal Medicine

## 2019-02-16 VITALS — BP 128/82 | HR 65 | Temp 98.6°F | Ht 72.0 in

## 2019-02-16 DIAGNOSIS — T796XXA Traumatic ischemia of muscle, initial encounter: Secondary | ICD-10-CM | POA: Diagnosis not present

## 2019-02-16 DIAGNOSIS — R740 Nonspecific elevation of levels of transaminase and lactic acid dehydrogenase [LDH]: Secondary | ICD-10-CM

## 2019-02-16 DIAGNOSIS — R7401 Elevation of levels of liver transaminase levels: Secondary | ICD-10-CM

## 2019-02-16 DIAGNOSIS — I10 Essential (primary) hypertension: Secondary | ICD-10-CM | POA: Diagnosis not present

## 2019-02-16 DIAGNOSIS — E1149 Type 2 diabetes mellitus with other diabetic neurological complication: Secondary | ICD-10-CM

## 2019-02-16 LAB — HEPATIC FUNCTION PANEL
ALK PHOS: 48 U/L (ref 39–117)
ALT: 39 U/L (ref 0–53)
AST: 23 U/L (ref 0–37)
Albumin: 4.2 g/dL (ref 3.5–5.2)
Bilirubin, Direct: 0.1 mg/dL (ref 0.0–0.3)
Total Bilirubin: 0.3 mg/dL (ref 0.2–1.2)
Total Protein: 7 g/dL (ref 6.0–8.3)

## 2019-02-16 LAB — CBC WITH DIFFERENTIAL/PLATELET
Basophils Absolute: 0.1 10*3/uL (ref 0.0–0.1)
Basophils Relative: 0.8 % (ref 0.0–3.0)
Eosinophils Absolute: 0.4 10*3/uL (ref 0.0–0.7)
Eosinophils Relative: 4.5 % (ref 0.0–5.0)
HCT: 42.6 % (ref 39.0–52.0)
Hemoglobin: 14.4 g/dL (ref 13.0–17.0)
LYMPHS ABS: 2.1 10*3/uL (ref 0.7–4.0)
Lymphocytes Relative: 25.7 % (ref 12.0–46.0)
MCHC: 33.8 g/dL (ref 30.0–36.0)
MCV: 86.6 fl (ref 78.0–100.0)
MONO ABS: 0.7 10*3/uL (ref 0.1–1.0)
Monocytes Relative: 7.8 % (ref 3.0–12.0)
NEUTROS ABS: 5.1 10*3/uL (ref 1.4–7.7)
NEUTROS PCT: 61.2 % (ref 43.0–77.0)
PLATELETS: 277 10*3/uL (ref 150.0–400.0)
RBC: 4.92 Mil/uL (ref 4.22–5.81)
RDW: 14.1 % (ref 11.5–15.5)
WBC: 8.3 10*3/uL (ref 4.0–10.5)

## 2019-02-16 LAB — BASIC METABOLIC PANEL
BUN: 19 mg/dL (ref 6–23)
CO2: 30 mEq/L (ref 19–32)
CREATININE: 0.76 mg/dL (ref 0.40–1.50)
Calcium: 9.5 mg/dL (ref 8.4–10.5)
Chloride: 103 mEq/L (ref 96–112)
GFR: 103.2 mL/min (ref >60.00)
Glucose, Bld: 128 mg/dL — ABNORMAL HIGH (ref 70–99)
Potassium: 3.3 mEq/L — ABNORMAL LOW (ref 3.5–5.1)
Sodium: 142 mEq/L (ref 135–145)

## 2019-02-16 LAB — CK: Total CK: 75 U/L (ref 7–232)

## 2019-02-16 NOTE — Assessment & Plan Note (Signed)
For f/u lab, asympt 

## 2019-02-16 NOTE — Assessment & Plan Note (Signed)
Likely improved, for f/u CK

## 2019-02-16 NOTE — Assessment & Plan Note (Signed)
stable overall by history and exam, recent data reviewed with pt, and pt to continue medical treatment as before,  to f/u any worsening symptoms or concerns  

## 2019-02-16 NOTE — Progress Notes (Signed)
Subjective:    Patient ID: Leon Nelson, male    DOB: 1956/08/13, 63 y.o.   MRN: [id number]  HPI Here for hospital f/u with admit jan 15 - 23 (and rehab from 1/23 -feb 15)  after triple arthrodesis left foot surgury per Dr Jacqualyn Posey, but in post op recovery could not stand with finding of severe right buttock pain and overall extremity weakness to UE and LE's, despite valium and pain med .    Sent home post op by being carried to the car, but pain recurred while sitting in a line of the car, and wife took him to [facility], with finding of rhabdomyolysis, required admit with IVF's, fortunately without finding of renal dysfxn.  Allowed for d/c with holding the Potassium, simvastatin, and lisinopril.  In retrospect pt has found that he was in one position during surgury for 4.5 hours and believes that anesthesia failed to turn him during the [place]. Wife is Psychiatric nurse, and has already checked that the signed the complete medical waiver prior to surgury.   Still taking the lovenox once daily now at 0.4 ml (40 units) - plans to continue this until mar 15.  Gets stitchs out tomorrow.   BP Readings from Last 3 Encounters:  02/16/19 128/82  01/27/19 117/73  01/15/19 116/70  Last wt per pt 299 at rehab (adjusted 4 lbs for LLE cast).  Did not weight today Wt Readings from Last 3 Encounters:  01/12/19 (!) 319 lb 10.7 oz (145 kg)  10/29/18 (!) 307 lb (139.3 kg)  04/17/18 (!) 310 lb (140.6 kg)  Currently at home, walking with walker with knee brace, uses wheelchair in the home during showering and toileting.  Balance much improved overall but still slightly a problem.  No falls.  Overall stamina is much improved, really only held back by his current LLE predicament.  He has and 'iWalker 2.0" to go up and down steps.    Past Medical History:  Diagnosis Date  . ABDOMINAL PAIN OTHER SPECIFIED SITE 07/20/2008  . CHEST PAIN 02/06/2011  . COLONIC POLYPS, HX OF 05/26/2008  . DEPRESSION 05/26/2008  .  DIABETES MELLITUS, TYPE II 05/26/2008  . HYPERLIPIDEMIA 05/26/2008  . HYPERTENSION 05/26/2008  . HYPOTHYROIDISM 05/26/2008  . KNEE PAIN, RIGHT, ACUTE 07/01/2008  . Personal history of urinary calculi 05/26/2008  . UTI'S, HX OF 05/26/2008   Past Surgical History:  Procedure Laterality Date  . s/p right knee arthroplasty      complicated by patellar tendon rupture Jan 2011 Dr. Gladstone Lighter  . TONSILLECTOMY      reports that he has never smoked. He has never used smokeless tobacco. He reports that he does not drink alcohol or use drugs. family history includes Alcohol abuse in an other family member; Cancer in his mother; Diabetes in an other family member; Heart disease in his father and mother; Hyperlipidemia in his father and mother. Allergies  Allergen Reactions  . Penicillins Other (See Comments)    Blisters on hands  . Sulfonamide Derivatives Other (See Comments)    Blisters in groin area   Current Outpatient Medications on File Prior to Visit  Medication Sig Dispense Refill  . allopurinol (ZYLOPRIM) 300 MG tablet Take 1 tablet (300 mg total) by mouth daily. 30 tablet 0  . atenolol-chlorthalidone (TENORETIC) 50-25 MG tablet Take 0.5 tablets by mouth daily. TAKE ONE-HALF (1/2) TABLET DAILY 30 tablet 0  . bisacodyl (DULCOLAX) 10 MG suppository Place 1 suppository (10 mg total) rectally daily as  needed for moderate constipation. 12 suppository 0  . diclofenac sodium (VOLTAREN) 1 % GEL Apply 2 g topically 4 (four) times daily as needed. (Patient taking differently: Apply 2 g topically 4 (four) times daily as needed (pain). ) 200 g 5  . dicyclomine (BENTYL) 20 MG tablet TAKE 1 TABLET BY MOUTH THREE TIMES DAILY AS NEEDED (Patient taking differently: Take 20 mg by mouth daily. ) 90 tablet 3  . enoxaparin (LOVENOX) 80 MG/0.8ML injection Inject 0.7 mLs (70 mg total) into the skin daily. 24 mL 0  . gabapentin (NEURONTIN) 300 MG capsule TAKE 1 CAPSULE BY MOUTH EVERYDAY AT BEDTIME (Patient taking differently:  Take 300 mg by mouth at bedtime. ) 30 capsule 3  . glipiZIDE (GLUCOTROL XL) 10 MG 24 hr tablet TAKE 1 TABLET DAILY WITH BREAKFAST (Patient taking differently: Take 10 mg by mouth daily. ) 90 tablet 3  . levothyroxine (SYNTHROID, LEVOTHROID) 50 MCG tablet Take 1 tablet (50 mcg total) by mouth daily. 7 tablet 0  . metFORMIN (GLUCOPHAGE-XR) 500 MG 24 hr tablet TAKE 3 TABLETS IN THE MORNING (Patient taking differently: Take 1,500 mg by mouth daily. ) 270 tablet 1  . oxyCODONE-acetaminophen (PERCOCET/ROXICET) 5-325 MG tablet Take 1 tablet by mouth every 4 (four) hours as needed for moderate pain. 30 tablet 0  . polyethylene glycol (MIRALAX / GLYCOLAX) packet Take 17 g by mouth daily. 14 each 0  . senna-docusate (SENOKOT-S) 8.6-50 MG tablet Take 2 tablets by mouth 2 (two) times daily. 120 tablet 0  . sildenafil (VIAGRA) 100 MG tablet Take 0.5-1 tablets (50-100 mg total) by mouth daily as needed. (Patient taking differently: Take 50-100 mg by mouth daily as needed for erectile dysfunction. ) 10 tablet 0  . venlafaxine XR (EFFEXOR-XR) 75 MG 24 hr capsule Take 1 capsule (75 mg total) by mouth daily. 30 capsule 0  . Vitamin D, Ergocalciferol, (DRISDOL) 1.25 MG (50000 UT) CAPS capsule TAKE 1 CAPSULE BY MOUTH EVERY 7 DAYS 5 capsule 0   No current facility-administered medications on file prior to visit.    Review of Systems  Constitutional: Negative for other unusual diaphoresis or sweats HENT: Negative for ear discharge or swelling Eyes: Negative for other worsening visual disturbances Respiratory: Negative for stridor or other swelling  Gastrointestinal: Negative for worsening distension or other blood Genitourinary: Negative for retention or other urinary change Musculoskeletal: Negative for other MSK pain or swelling Skin: Negative for color change or other new lesions Neurological: Negative for worsening tremors and other numbness  Psychiatric/Behavioral: Negative for worsening agitation or other  fatigue All other system neg per pt    Objective:   Physical Exam BP 128/82   Pulse 65   Temp 98.6 F (37 C) (Oral)   Ht 6' (1.829 m)   SpO2 98%   BMI 43.35 kg/m  VS noted,  Constitutional: Pt appears in NAD HENT: Head: NCAT.  Right Ear: External ear normal.  Left Ear: External ear normal.  Eyes: . Pupils are equal, round, and reactive to light. Conjunctivae and EOM are normal Nose: without d/c or deformity Neck: Neck supple. Gross normal ROM Cardiovascular: Normal rate and regular rhythm.   Pulmonary/Chest: Effort normal and breath sounds without rales or wheezing.  Abd:  Soft, NT, ND, + BS, no organomegaly Neurological: Pt is alert. At baseline orientation, motor grossly intact, LLE casted below the knee to toes Skin: Skin is warm. No rashes, other new lesions, no LE edema Psychiatric: Pt behavior is normal without agitation  No  other exam findings  Lab Results  Component Value Date   WBC 9.5 01/13/2019   HGB 12.5 (L) 01/13/2019   HCT 37.3 (L) 01/13/2019   PLT 259 01/13/2019   GLUCOSE 160 (H) 01/14/2019   CHOL 125 10/27/2018   TRIG 134.0 10/27/2018   HDL 33.10 (L) 10/27/2018   LDLDIRECT 78.0 04/17/2018   LDLCALC 65 10/27/2018   ALT 48 (H) 01/14/2019   AST 39 01/14/2019   NA 140 01/14/2019   K 3.7 01/14/2019   CL 100 01/14/2019   CREATININE 0.86 01/14/2019   BUN 10 01/14/2019   CO2 31 01/14/2019   TSH 2.63 10/27/2018   PSA 0.11 10/27/2018   INR 2.0 ratio (H) 01/16/2010   HGBA1C 6.7 (H) 10/27/2018   MICROALBUR 1.4 10/27/2018          Assessment & Plan:

## 2019-02-16 NOTE — Patient Instructions (Signed)
Please continue all other medications as before, and we will need to let you know about restarting the Lisinopril, Potassium and Statin  Please have the pharmacy call with any other refills you may need.  Please continue your efforts at being more active, low cholesterol diet, and weight control.  Please keep your appointments with your specialists as you may have planned  Please go to the LAB in the Basement (turn left off the elevator) for the tests to be done today  You will be contacted by phone if any changes need to be made immediately.  Otherwise, you will receive a letter about your results with an explanation, but please check with MyChart first.  Please remember to sign up for MyChart if you have not done so, as this will be important to you in the future with finding out test results, communicating by private email, and scheduling acute appointments online when needed.

## 2019-02-17 ENCOUNTER — Ambulatory Visit (INDEPENDENT_AMBULATORY_CARE_PROVIDER_SITE_OTHER): Payer: BLUE CROSS/BLUE SHIELD

## 2019-02-17 ENCOUNTER — Telehealth: Payer: Self-pay

## 2019-02-17 ENCOUNTER — Ambulatory Visit (INDEPENDENT_AMBULATORY_CARE_PROVIDER_SITE_OTHER): Payer: Self-pay | Admitting: Podiatry

## 2019-02-17 VITALS — BP 106/65 | HR 66 | Temp 99.3°F

## 2019-02-17 DIAGNOSIS — M19079 Primary osteoarthritis, unspecified ankle and foot: Secondary | ICD-10-CM

## 2019-02-17 DIAGNOSIS — Z09 Encounter for follow-up examination after completed treatment for conditions other than malignant neoplasm: Secondary | ICD-10-CM

## 2019-02-17 DIAGNOSIS — M19072 Primary osteoarthritis, left ankle and foot: Secondary | ICD-10-CM | POA: Diagnosis not present

## 2019-02-17 NOTE — Telephone Encounter (Signed)
-----   Message from Biagio Borg, MD sent at 02/16/2019  5:28 PM EST ----- Letter sent, cont same tx except  The test results show that your current treatment is OK, except the potassium is mildly low again.  It is OK to restart the Lisinopril, Potassium and Simvastatin as the other tests are good.  Leon Nelson to please inform pt

## 2019-02-17 NOTE — Telephone Encounter (Signed)
Pt has been informed of results and expressed understanding.  °

## 2019-02-18 ENCOUNTER — Other Ambulatory Visit: Payer: Self-pay

## 2019-02-18 MED ORDER — ENOXAPARIN SODIUM 40 MG/0.4ML ~~LOC~~ SOLN: 40.0000 mg | SUBCUTANEOUS | 0 refills | Status: DC

## 2019-02-18 NOTE — Progress Notes (Signed)
Spoke with patient advising him of Dr Leigh Aurora orders for him to stay on Lovenox until we take him out of the cast on his next post op visit 3.6.2020. Refill sent to pharmacy.

## 2019-02-18 NOTE — Progress Notes (Signed)
Subjective: Leon Nelson is a 63 y.o. is seen today in office s/p left foot triple arthrodesis preformed on 0/62/3762 thus complicated with postop rhabdomyolysis.  Patient presents today for cast change as well as for staple removal.  Overall he states he feels well he is not taking any pain medication.  He states he is ready to get out of the cast.  He is still on Lovenox and he has 4 more days of this current prescription.  He is not taking any antibiotics currently.  He is to ice and elevate as much as he can.  He did follow-up with Dr. Jenny Reichmann yesterday.   Objective: General: No acute distress, AAOx3  DP/PT pulses palpable 2/4, CRT < 3 sec to all digits.  Protective sensation intact. Motor function intact.  LEFT foot: Incision is well coapted without any evidence of dehiscence and staples are intact without any dehiscence.  There is mild erythema to the medial incision but this appears more from inflammation.  Is no drainage or pus coming to the incision there is no ascending cellulitis.  No fluctuation or crepitation.  Overall incisions appear to be healing well.  After remove the cast he was moving his ankle up and down he states he is having no pain he states he is very excited because he was not able to do this before surgery without pain he states that his foot is already feeling better.  Discussed with him that although it is feeling better to test will be when he puts weight on his foot.  There is no pain with calf compression, swelling, warmth, erythema. No other open lesions or pre-ulcerative lesions.  No pain with calf compression, swelling, warmth, erythema.   Assessment and Plan:  Status post left foot surgery, doing well at this time with no complications   -Treatment options discussed including all alternatives, risks, and complications -X-ray was obtained and reviewed.  Hardware intact.  Some gapping still present in the calcaneocuboid joint but otherwise there is increased  consolidation across the arthrodesis site. -Today the cast was removed.  Remove some of the staples today.  I did leave some intact to ensure that the incision continues to heal well.  A new fiberglass cast was applied making sure to pad all bony prominences and antibiotic ointment and a bandage was applied to the incision sites. -Continue to ice and elevate continue nonweightbearing. -Finish this course of Lovenox.  I will keep him on Lovenox until he is out of the cast hopefully next week. -Vitamin D level still low.  Continue weekly vitamin D will recheck in 1 month.   -Continue to monitor any signs or symptoms of infection and DVT/PE.  Return in about 10 days (around 02/27/2019) for  patient.  Trula Slade DPM

## 2019-02-26 ENCOUNTER — Telehealth: Payer: Self-pay | Admitting: Internal Medicine

## 2019-02-26 NOTE — Telephone Encounter (Signed)
Copied from [place] [phone]. Topic: General - Other >> Feb 26, 2019  3:11 PM Leward Quan A wrote: Reason for CRM: Patient called to request an Rx sent to [place], [place] [phone] (Phone) [phone] (Fax)  For him to get a new Glucometer kit so that he can keep tract of his blood sugar. Stated that he was told by the hospital to get one. Please advise

## 2019-02-27 ENCOUNTER — Ambulatory Visit (INDEPENDENT_AMBULATORY_CARE_PROVIDER_SITE_OTHER): Payer: BLUE CROSS/BLUE SHIELD | Admitting: Podiatry

## 2019-02-27 ENCOUNTER — Encounter: Payer: Self-pay | Admitting: Podiatry

## 2019-02-27 VITALS — BP 109/60 | HR 56 | Temp 99.1°F | Resp 16

## 2019-02-27 DIAGNOSIS — M19079 Primary osteoarthritis, unspecified ankle and foot: Secondary | ICD-10-CM

## 2019-02-27 DIAGNOSIS — M19072 Primary osteoarthritis, left ankle and foot: Secondary | ICD-10-CM

## 2019-02-27 DIAGNOSIS — E559 Vitamin D deficiency, unspecified: Secondary | ICD-10-CM

## 2019-02-27 DIAGNOSIS — Z09 Encounter for follow-up examination after completed treatment for conditions other than malignant neoplasm: Secondary | ICD-10-CM

## 2019-02-27 MED ORDER — ONETOUCH ULTRA 2 W/DEVICE KIT: PACK | 0 refills | Status: DC

## 2019-02-27 MED ORDER — LANCETS MISC: 3 refills | Status: DC

## 2019-02-27 MED ORDER — GLUCOSE BLOOD VI STRP: ORAL_STRIP | 12 refills | Status: DC

## 2019-02-27 NOTE — Telephone Encounter (Signed)
rx sent [place], and he may wish to call to see if onetouch is covered by his insurance

## 2019-02-27 NOTE — Addendum Note (Signed)
Addended by: Biagio Borg on: 02/27/2019 04:41 PM   Modules accepted: Orders

## 2019-02-28 DIAGNOSIS — M19079 Primary osteoarthritis, unspecified ankle and foot: Secondary | ICD-10-CM | POA: Diagnosis not present

## 2019-02-28 DIAGNOSIS — G8929 Other chronic pain: Secondary | ICD-10-CM | POA: Diagnosis not present

## 2019-02-28 DIAGNOSIS — M25571 Pain in right ankle and joints of right foot: Secondary | ICD-10-CM | POA: Diagnosis not present

## 2019-03-02 ENCOUNTER — Telehealth: Payer: Self-pay | Admitting: Podiatry

## 2019-03-02 ENCOUNTER — Encounter: Payer: Self-pay | Admitting: Podiatry

## 2019-03-02 NOTE — Progress Notes (Signed)
Subjective: Leon Nelson is a 63 y.o. is seen today in office s/p left foot triple arthrodesis preformed on 3/00/9233 thus complicated with postop rhabdomyolysis.  He presents today for cast removal as well as to remove the remainder the staples.  He states he been doing well is having no pain.  He is just eager about getting into a boot.  He is not taking any pain medicine and is not on any antibiotics.  He denies any fevers, chills, nausea, vomiting.  No calf pain, chest pain, shortness of breath.  No other concerns.  Objective: General: No acute distress, AAOx3  DP/PT pulses palpable 2/4, CRT < 3 sec to all digits.  Protective sensation intact. Motor function intact.  LEFT foot: Incision is well coapted without any evidence of dehiscence and staples are intact without any dehiscence.  There is no surrounding erythema, ascending cellulitis.  Is no fluctuation crepitation any malodor.  There is some scab present more on the lateral incision.  There is no pain with range of motion of the ankle joint or along the surgical sites.  Dry skin is present.  No open sores. There is no pain with calf compression, swelling, warmth, erythema. No other open lesions or pre-ulcerative lesions.  No pain with calf compression, swelling, warmth, erythema.   Assessment and Plan:  Status post left foot surgery, doing well at this time with no complications   -Treatment options discussed including all alternatives, risks, and complications -I remove the remainder of the staples today without complication the incision remained well coapted.  Antibiotic ointment and a bandage was applied.  Plan to continue with this dressing for now.  He can use moisturizer on the other areas of the foot. -He can start to shower in about 2 days as long as the incision appears to be doing well.  If there is any question about the incision or any openings or upon showering to let me know. -Today he was placed into a cam boot.  I want  to treat this like a cast but he can start some range of motion exercises as well as ice to the area as needed. -Continue nonweightbearing for now. -States is going to transition to a boot, will hold further lovenox - I would like to recheck a vitamin D level in the next 2 weeks. Continue current dose.  -Monitor for any clinical signs or symptoms of infection and directed to call the office immediately should any occur or go to the ER.  RTC 2 weeks; x-ray next appointment  Trula Slade DPM

## 2019-03-02 NOTE — Telephone Encounter (Signed)
I would like to speak to [place] today before 3 pm if possible. If you could please her call me as soon as she can. Thank you.

## 2019-03-02 NOTE — Telephone Encounter (Signed)
Patient called and stated that his wife would be by tomorrow to get some bandages and also the patient stated that [facility] stated that they have no paper work for the patient as far as going to the hospital or going to rehab and per Dr Jacqualyn Posey will get with Marcie Bal [facility] Coord[facility]) and we will call patient back as soon as we hear from [place]. Leon Nelson

## 2019-03-06 ENCOUNTER — Other Ambulatory Visit: Payer: Self-pay | Admitting: Internal Medicine

## 2019-03-10 ENCOUNTER — Telehealth: Payer: Self-pay | Admitting: *Deleted

## 2019-03-10 NOTE — Telephone Encounter (Signed)
Called and spoke with the patient and patient stated that he was doing good and was red at times and when he is in the wheelchair and the leg is hanging down the leg gets numb and tingles some and when I get up in the morning it looks good and there is no draining and there are no fever or chills and no nausea and the scabs on the right are gone and the left side has one scab and I am still not putting no weight on it and I stated to the patient I would check with Dr Jacqualyn Posey and let patient know and to call the office if any concerns or questions at [phone]. Lattie Haw

## 2019-03-16 ENCOUNTER — Other Ambulatory Visit: Payer: Self-pay

## 2019-03-16 ENCOUNTER — Encounter: Payer: Self-pay | Admitting: Podiatry

## 2019-03-16 ENCOUNTER — Ambulatory Visit (INDEPENDENT_AMBULATORY_CARE_PROVIDER_SITE_OTHER): Payer: BLUE CROSS/BLUE SHIELD

## 2019-03-16 ENCOUNTER — Ambulatory Visit (INDEPENDENT_AMBULATORY_CARE_PROVIDER_SITE_OTHER): Payer: BLUE CROSS/BLUE SHIELD | Admitting: Podiatry

## 2019-03-16 ENCOUNTER — Other Ambulatory Visit: Payer: Self-pay | Admitting: Internal Medicine

## 2019-03-16 DIAGNOSIS — M19079 Primary osteoarthritis, unspecified ankle and foot: Secondary | ICD-10-CM

## 2019-03-16 DIAGNOSIS — M19072 Primary osteoarthritis, left ankle and foot: Secondary | ICD-10-CM | POA: Diagnosis not present

## 2019-03-16 DIAGNOSIS — S92902K Unspecified fracture of left foot, subsequent encounter for fracture with nonunion: Secondary | ICD-10-CM

## 2019-03-17 NOTE — Progress Notes (Signed)
Subjective: Leon Nelson is a 63 y.o. is seen today in office s/p left foot triple arthrodesis preformed on 3/81/0175 thus complicated with postop rhabdomyolysis.  He states overall he has been doing well he is having no pain.  He still has a small scab on the lateral incision is been keeping a Band-Aid on it.  Denies any drainage or pus.  He has had some faint redness of the incision site but no red streaks or any increase in swelling.  Overall is been doing well he is eager to start walking.  He states that he does not wear the cam boot often. He denies any fevers, chills, nausea, vomiting.  No calf pain, chest pain, shortness of breath.  No other concerns.  Objective: General: No acute distress, AAOx3  DP/PT pulses palpable 2/4, CRT < 3 sec to all digits.  Protective sensation intact. Motor function intact.  LEFT foot: Incision is well coapted without any evidence of dehiscence along the medial incision.  On the lateral incision there is 1 superficial area that scab is starting to form measure about 0.7 x 0.7 cm.  There is a faint rim of erythema on the incisions on the left side of the this is more from inflammation as opposed to infection.  Is no drainage or pus.  There is decreased edema.  There is no ascending cellulitis.  There is no fluctuation crepitation or any malodor.  There is no tenderness palpation at surgical site.  Is no pain with ankle joint range of motion. No other open lesions or pre-ulcerative lesions.  No pain with calf compression, swelling, warmth, erythema.   Assessment and Plan:  Status post left foot surgery, doing well   -Treatment options discussed including all alternatives, risks, and complications -X-rays were obtained and reviewed with the patient.  Hardware intact.  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 -At this time we will order a bone stimulator. -I did apply dispensed  medihoney to apply to the superficial wound on the left incision site.  He has a fever and erythema this is more from inflammation but he has antibiotics at home every night start to take those as a precaution. -He is very eager to put weight on the foot.  Discussed that he can put partial weight on the foot around the house for short distances utilizing his walker.  If there is any increase in pain, swelling or any issues he is to remain nonweightbearing call me immediately and he verbally understood this. -He did not get his vitamin D level check. He forgot to do this. He will do it this week. We are going to continue vitamin D for now.  -Monitor for any clinical signs or symptoms of infection and directed to call the office immediately should any occur or go to the ER.  RTC 2 weeks or sooner if needed. He is to call if any issues arise.   Trula Slade DPM

## 2019-03-26 DIAGNOSIS — E559 Vitamin D deficiency, unspecified: Secondary | ICD-10-CM | POA: Diagnosis not present

## 2019-03-27 ENCOUNTER — Telehealth: Payer: Self-pay | Admitting: *Deleted

## 2019-03-27 LAB — VITAMIN D 25 HYDROXY (VIT D DEFICIENCY, FRACTURES): Vit D, 25-Hydroxy: 24 ng/mL — ABNORMAL LOW (ref 30–100)

## 2019-03-27 NOTE — Telephone Encounter (Signed)
Left message for pt to call for lab results and instructions.

## 2019-03-27 NOTE — Telephone Encounter (Signed)
-----   Message from Trula Slade, DPM sent at 03/27/2019 10:43 AM EDT ----- Tivis Ringer- please let him know that the vitamin D is better than what it was but we need to continue with it. If he needs a refill, it is OK to refill the 50,000 weekly.  Thanks.

## 2019-03-30 ENCOUNTER — Other Ambulatory Visit: Payer: Self-pay

## 2019-03-30 ENCOUNTER — Encounter: Payer: Self-pay | Admitting: Podiatry

## 2019-03-30 ENCOUNTER — Ambulatory Visit (INDEPENDENT_AMBULATORY_CARE_PROVIDER_SITE_OTHER): Payer: BLUE CROSS/BLUE SHIELD | Admitting: Podiatry

## 2019-03-30 VITALS — Temp 97.9°F

## 2019-03-30 DIAGNOSIS — M19079 Primary osteoarthritis, unspecified ankle and foot: Secondary | ICD-10-CM

## 2019-03-30 DIAGNOSIS — Z09 Encounter for follow-up examination after completed treatment for conditions other than malignant neoplasm: Secondary | ICD-10-CM

## 2019-03-30 DIAGNOSIS — S92902K Unspecified fracture of left foot, subsequent encounter for fracture with nonunion: Secondary | ICD-10-CM

## 2019-03-30 MED ORDER — VITAMIN D (ERGOCALCIFEROL) 1.25 MG (50000 UNIT) PO CAPS: ORAL_CAPSULE | ORAL | 0 refills | Status: DC

## 2019-03-30 NOTE — Telephone Encounter (Signed)
I informed pt of Dr. Leigh Aurora review of results and orders. Pt states understanding.

## 2019-03-31 DIAGNOSIS — M25571 Pain in right ankle and joints of right foot: Secondary | ICD-10-CM | POA: Diagnosis not present

## 2019-03-31 DIAGNOSIS — G8929 Other chronic pain: Secondary | ICD-10-CM | POA: Diagnosis not present

## 2019-03-31 DIAGNOSIS — M19079 Primary osteoarthritis, unspecified ankle and foot: Secondary | ICD-10-CM | POA: Diagnosis not present

## 2019-03-31 NOTE — Progress Notes (Signed)
Subjective: Leon Nelson is a 63 y.o. is seen today in office s/p left foot triple arthrodesis preformed on 8/85/0277 thus complicated with postop rhabdomyolysis.  Overall he states he been doing well.  He did not put any weight on his foot since I saw him last out of caution. He has been keeping a bandage on the incision site along the lateral aspect.  He denies any drainage or pus.  He did finish his antibiotics.  He denies any redness on the incisions of any drainage or pus or any red streaks. He denies any fevers, chills, nausea, vomiting.  No calf pain, chest pain, shortness of breath.  No other concerns.  Objective: General: No acute distress, AAOx3  DP/PT pulses palpable 2/4, CRT < 3 sec to all digits.  Protective sensation intact. Motor function intact.  LEFT foot: Incision is well coapted without any evidence of dehiscence along the medial incision.  On the lateral incision there is 1 superficial area that scab is starting to form measure about 0.7 x 0.7 cm.  Overall is doing about the same.  Is more superficial and starting to scab over.  There is no surrounding erythema, ascending cellulitis.  There is no fluctuation crepitation any malodor.  There is no tenderness palpation at surgical sites or with ankle joint range of motion.  No other open lesions or pre-ulcerative lesions.  No pain with calf compression, swelling, warmth, erythema.   Assessment and Plan:  Status post left foot surgery, doing well   -Treatment options discussed including all alternatives, risks, and complications -I did debride the wound on the distal portion of the incision went to healthy, bleeding edges.  Continue with daily dressing changes and gave him medhoney.  Monitoring signs or symptoms of infection.  Continue cam boot for now.  Once the wound completely heals he can start partial weightbearing.  I will see him back in 2 weeks and hopefully at that is healed we can start some physical therapy. -He is not  taking any pain medication. -Refilled vitamin D -He has not heard back about the bone stimulator. Will follow up on this.  -Monitor for any clinical signs or symptoms of infection and directed to call the office immediately should any occur or go to the ER.  RTC 2 weeks or sooner if needed. He is to call if any issues arise.   Trula Slade DPM

## 2019-04-13 ENCOUNTER — Other Ambulatory Visit: Payer: Self-pay

## 2019-04-13 ENCOUNTER — Ambulatory Visit (INDEPENDENT_AMBULATORY_CARE_PROVIDER_SITE_OTHER): Payer: BLUE CROSS/BLUE SHIELD | Admitting: Podiatry

## 2019-04-13 ENCOUNTER — Encounter: Payer: Self-pay | Admitting: Podiatry

## 2019-04-13 ENCOUNTER — Ambulatory Visit (INDEPENDENT_AMBULATORY_CARE_PROVIDER_SITE_OTHER): Payer: BLUE CROSS/BLUE SHIELD

## 2019-04-13 VITALS — Temp 96.3°F

## 2019-04-13 DIAGNOSIS — M19079 Primary osteoarthritis, unspecified ankle and foot: Secondary | ICD-10-CM

## 2019-04-13 DIAGNOSIS — M2141 Flat foot [pes planus] (acquired), right foot: Secondary | ICD-10-CM

## 2019-04-13 DIAGNOSIS — M19072 Primary osteoarthritis, left ankle and foot: Secondary | ICD-10-CM

## 2019-04-13 DIAGNOSIS — E559 Vitamin D deficiency, unspecified: Secondary | ICD-10-CM

## 2019-04-13 DIAGNOSIS — M2142 Flat foot [pes planus] (acquired), left foot: Secondary | ICD-10-CM

## 2019-04-13 DIAGNOSIS — S92901K Unspecified fracture of right foot, subsequent encounter for fracture with nonunion: Secondary | ICD-10-CM

## 2019-04-13 NOTE — Progress Notes (Signed)
Subjective: Leon Nelson is a 63 y.o. is seen today in office s/p left foot triple arthrodesis preformed on 5/46/2703 thus complicated with postop rhabdomyolysis.  He states he has been doing well.  He is put some weight on his foot he states that he does he has not had any increasing pain or swelling.  He stopped keeping a bandage on the lateral incision because it was draining too moist and since doing this the area has scabbed over.  Denies any drainage or pus.  No increase in swelling, redness or any pain to his foot.  He states his range of motion is still good and he has no new concerns.  He is eager to start to put weight on his foot.  He denies any fevers, chills, nausea, vomiting.  No calf pain, chest pain, shortness of breath.  No other concerns.  Objective: General: No acute distress, AAOx3  DP/PT pulses palpable 2/4, CRT < 3 sec to all digits.  Protective sensation intact. Motor function intact.  LEFT foot: Incision is well coapted without any evidence of dehiscence along the medial incision.  On the lateral incision the area that was opened has scabbed over.  There is no surrounding erythema, ascending cellulitis.  No fluctuation crepitation any malodor.  There is no drainage or pus identified today.  There is no tenderness palpation of the surgical sites.  No pain with ankle range of motion or restriction. No pain with calf compression, swelling, warmth, erythema.   Assessment and Plan:  Status post left foot surgery, doing well   -Treatment options discussed including all alternatives, risks, and complications -X-rays obtained reviewed.  Hardware intact.  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  Otherwise hardware intact.  There is still a radiolucency on the talonavicular joint along the lateral aspect of the medial aspect has consolidation.  Increased consolidation across the subtalar joint arthrodesis site. -I am going to order  a bone stimulator for him today.  I contacted Leon Nelson for this.  Due to the nonunion fracture He benefit from this as well as history of diabetes with vitamin D deficiency. -Continue 50,000 units weekly vitamin D.  Consider recheck this in a couple weeks. -We discussed partial weightbearing.  Today on the way out but should not use a walker with minimal weightbearing to the left foot.  Also start physical therapy and I discussed the case with Leon Nelson from PT.  -Try to get him an [place] made in order to get him back to work in June.  He is no follow-up with Leon Nelson for this.  The goal of this is to help with healing, gait instability and joint stability. -Follow-up in 3 weeks or sooner if needed.  Leon Nelson DPM   -I did debride the wound on the distal portion of the incision went to healthy, bleeding edges.  Continue with daily dressing changes and gave him medhoney.  Monitoring signs or symptoms of infection.  Continue cam boot for now.  Once the wound completely heals he can start partial weightbearing.  I will see him back in 2 weeks and hopefully at that is healed we can start some physical therapy. -He is not taking any pain medication. -Refilled vitamin D -He has not heard back about the bone stimulator. Will follow up on this.  -Monitor for any clinical signs or symptoms of infection and directed to call the office immediately should any occur or go to the  ER.  RTC 2 weeks or sooner if needed. He is to call if any issues arise.   Leon Nelson DPM

## 2019-04-14 ENCOUNTER — Other Ambulatory Visit: Payer: Self-pay

## 2019-04-14 ENCOUNTER — Telehealth: Payer: Self-pay | Admitting: *Deleted

## 2019-04-14 ENCOUNTER — Ambulatory Visit: Payer: BLUE CROSS/BLUE SHIELD | Admitting: Orthotics

## 2019-04-14 DIAGNOSIS — Z09 Encounter for follow-up examination after completed treatment for conditions other than malignant neoplasm: Secondary | ICD-10-CM

## 2019-04-14 DIAGNOSIS — M2142 Flat foot [pes planus] (acquired), left foot: Secondary | ICD-10-CM

## 2019-04-14 DIAGNOSIS — E559 Vitamin D deficiency, unspecified: Secondary | ICD-10-CM

## 2019-04-14 DIAGNOSIS — M19079 Primary osteoarthritis, unspecified ankle and foot: Secondary | ICD-10-CM

## 2019-04-14 DIAGNOSIS — S92902K Unspecified fracture of left foot, subsequent encounter for fracture with nonunion: Secondary | ICD-10-CM

## 2019-04-14 DIAGNOSIS — M2141 Flat foot [pes planus] (acquired), right foot: Secondary | ICD-10-CM

## 2019-04-14 DIAGNOSIS — S92901K Unspecified fracture of right foot, subsequent encounter for fracture with nonunion: Secondary | ICD-10-CM

## 2019-04-14 NOTE — Telephone Encounter (Signed)
Faxed the 04/13/2019 clinicals and demographics and rx to [facility].

## 2019-04-14 NOTE — Progress Notes (Signed)
Patient presents today for evaluation/casting for AFO brace (L).   Patient has hx of the following conditions: Gait instability,  Ankle instabilty,  Gait analysis done and patient displays abnormality of gait in both sagittial and frontal planes, and could benefit in aggressive ankle support.  Patient chose [place] brace w/ lace/speed laces.  

## 2019-04-17 DIAGNOSIS — R269 Unspecified abnormalities of gait and mobility: Secondary | ICD-10-CM | POA: Diagnosis not present

## 2019-04-17 DIAGNOSIS — M25472 Effusion, left ankle: Secondary | ICD-10-CM | POA: Diagnosis not present

## 2019-04-17 DIAGNOSIS — M25672 Stiffness of left ankle, not elsewhere classified: Secondary | ICD-10-CM | POA: Diagnosis not present

## 2019-04-17 DIAGNOSIS — M25572 Pain in left ankle and joints of left foot: Secondary | ICD-10-CM | POA: Diagnosis not present

## 2019-04-21 DIAGNOSIS — M25672 Stiffness of left ankle, not elsewhere classified: Secondary | ICD-10-CM | POA: Diagnosis not present

## 2019-04-21 DIAGNOSIS — R269 Unspecified abnormalities of gait and mobility: Secondary | ICD-10-CM | POA: Diagnosis not present

## 2019-04-21 DIAGNOSIS — M25572 Pain in left ankle and joints of left foot: Secondary | ICD-10-CM | POA: Diagnosis not present

## 2019-04-21 DIAGNOSIS — M25472 Effusion, left ankle: Secondary | ICD-10-CM | POA: Diagnosis not present

## 2019-04-22 ENCOUNTER — Telehealth: Payer: Self-pay | Admitting: *Deleted

## 2019-04-22 NOTE — Telephone Encounter (Signed)
Pt request a call from Dr. Leigh Aurora assistant Lattie Haw.

## 2019-04-24 DIAGNOSIS — M25672 Stiffness of left ankle, not elsewhere classified: Secondary | ICD-10-CM | POA: Diagnosis not present

## 2019-04-24 DIAGNOSIS — M25472 Effusion, left ankle: Secondary | ICD-10-CM | POA: Diagnosis not present

## 2019-04-24 DIAGNOSIS — R269 Unspecified abnormalities of gait and mobility: Secondary | ICD-10-CM | POA: Diagnosis not present

## 2019-04-24 DIAGNOSIS — M25572 Pain in left ankle and joints of left foot: Secondary | ICD-10-CM | POA: Diagnosis not present

## 2019-04-27 ENCOUNTER — Telehealth: Payer: Self-pay | Admitting: *Deleted

## 2019-04-27 NOTE — Telephone Encounter (Signed)
Dr. Jacqualyn Posey states pt received a denial letter for the [place], and request information to perform an appeal. T. Nicole Kindred - [employer] states pt's insurance does not approve bone growth stimulator, but he will email information to Dr. Jacqualyn Posey.

## 2019-04-28 DIAGNOSIS — M25672 Stiffness of left ankle, not elsewhere classified: Secondary | ICD-10-CM | POA: Diagnosis not present

## 2019-04-28 DIAGNOSIS — R269 Unspecified abnormalities of gait and mobility: Secondary | ICD-10-CM | POA: Diagnosis not present

## 2019-04-28 DIAGNOSIS — M25572 Pain in left ankle and joints of left foot: Secondary | ICD-10-CM | POA: Diagnosis not present

## 2019-04-28 DIAGNOSIS — M25472 Effusion, left ankle: Secondary | ICD-10-CM | POA: Diagnosis not present

## 2019-04-29 NOTE — Telephone Encounter (Signed)
I tried to call and do an appeal but it is not covered under [place] [employer] as it is consider "investigative". He can do an appeal and there is apparently a form he can sign that I can act on his behalf. Do you know how to get him that form. I called [employer] and he states it did not come with his letter so not sure how to go about doing this.

## 2019-04-30 ENCOUNTER — Ambulatory Visit: Payer: BLUE CROSS/BLUE SHIELD | Admitting: Internal Medicine

## 2019-04-30 DIAGNOSIS — M19079 Primary osteoarthritis, unspecified ankle and foot: Secondary | ICD-10-CM | POA: Diagnosis not present

## 2019-04-30 DIAGNOSIS — M25571 Pain in right ankle and joints of right foot: Secondary | ICD-10-CM | POA: Diagnosis not present

## 2019-04-30 DIAGNOSIS — G8929 Other chronic pain: Secondary | ICD-10-CM | POA: Diagnosis not present

## 2019-04-30 NOTE — Telephone Encounter (Signed)
I informed pt [employer] - Leon Nelson stated pt would have to contact [employer] for form to allow Dr. Jacqualyn Posey to speak on his behalf. Pt states understanding.

## 2019-05-01 DIAGNOSIS — M25472 Effusion, left ankle: Secondary | ICD-10-CM | POA: Diagnosis not present

## 2019-05-01 DIAGNOSIS — R269 Unspecified abnormalities of gait and mobility: Secondary | ICD-10-CM | POA: Diagnosis not present

## 2019-05-01 DIAGNOSIS — M25572 Pain in left ankle and joints of left foot: Secondary | ICD-10-CM | POA: Diagnosis not present

## 2019-05-01 DIAGNOSIS — M25672 Stiffness of left ankle, not elsewhere classified: Secondary | ICD-10-CM | POA: Diagnosis not present

## 2019-05-04 ENCOUNTER — Ambulatory Visit (INDEPENDENT_AMBULATORY_CARE_PROVIDER_SITE_OTHER): Payer: BLUE CROSS/BLUE SHIELD

## 2019-05-04 ENCOUNTER — Ambulatory Visit (INDEPENDENT_AMBULATORY_CARE_PROVIDER_SITE_OTHER): Payer: BLUE CROSS/BLUE SHIELD | Admitting: Podiatry

## 2019-05-04 ENCOUNTER — Other Ambulatory Visit: Payer: Self-pay

## 2019-05-04 ENCOUNTER — Ambulatory Visit (INDEPENDENT_AMBULATORY_CARE_PROVIDER_SITE_OTHER): Payer: BLUE CROSS/BLUE SHIELD | Admitting: Orthotics

## 2019-05-04 ENCOUNTER — Encounter: Payer: Self-pay | Admitting: Podiatry

## 2019-05-04 VITALS — Temp 95.2°F

## 2019-05-04 DIAGNOSIS — M19079 Primary osteoarthritis, unspecified ankle and foot: Secondary | ICD-10-CM | POA: Diagnosis not present

## 2019-05-04 DIAGNOSIS — M2142 Flat foot [pes planus] (acquired), left foot: Secondary | ICD-10-CM

## 2019-05-04 DIAGNOSIS — M19072 Primary osteoarthritis, left ankle and foot: Secondary | ICD-10-CM

## 2019-05-04 DIAGNOSIS — S92902K Unspecified fracture of left foot, subsequent encounter for fracture with nonunion: Secondary | ICD-10-CM | POA: Diagnosis not present

## 2019-05-04 DIAGNOSIS — M2141 Flat foot [pes planus] (acquired), right foot: Secondary | ICD-10-CM

## 2019-05-04 DIAGNOSIS — L820 Inflamed seborrheic keratosis: Secondary | ICD-10-CM | POA: Diagnosis not present

## 2019-05-04 DIAGNOSIS — L821 Other seborrheic keratosis: Secondary | ICD-10-CM | POA: Diagnosis not present

## 2019-05-04 DIAGNOSIS — D1801 Hemangioma of skin and subcutaneous tissue: Secondary | ICD-10-CM | POA: Diagnosis not present

## 2019-05-04 NOTE — Progress Notes (Signed)
Patient came in today to pick up standard Afo brace.  Patient was evaluated for fit and function.   The brace fit very well and there were any complaints of the way it felt once donned.  The brace offered ankle stability in both saggital and coroneal planes.  Patient advised to always wear proper fitting shoes with brace. 

## 2019-05-05 DIAGNOSIS — M25572 Pain in left ankle and joints of left foot: Secondary | ICD-10-CM | POA: Diagnosis not present

## 2019-05-05 DIAGNOSIS — R269 Unspecified abnormalities of gait and mobility: Secondary | ICD-10-CM | POA: Diagnosis not present

## 2019-05-05 DIAGNOSIS — M25472 Effusion, left ankle: Secondary | ICD-10-CM | POA: Diagnosis not present

## 2019-05-05 DIAGNOSIS — M25672 Stiffness of left ankle, not elsewhere classified: Secondary | ICD-10-CM | POA: Diagnosis not present

## 2019-05-06 NOTE — Progress Notes (Signed)
Subjective: Leon Nelson is a 63 y.o. is seen today in office s/p left foot triple arthrodesis preformed on 6/80/3212 thus complicated with postop rhabdomyolysis.  He has been going to PT and he states that this has been helpful and he is getting stronger. He presents today to also pick up an [place] with Leon Nelson in anticipation for him to get back to work and wear a shoe. Unfortunatly the bone stimulator has been denied by the insurance and he is trying to appeal. I had completed an appeal last week but unsuccessful so he is going to imitate it.  He denies any fevers, chills, nausea, vomiting.  No calf pain, chest pain, shortness of breath.  No other concerns.  Objective: General: No acute distress, AAOx3  DP/PT pulses palpable 2/4, CRT < 3 sec to all digits.  Protective sensation intact. Motor function intact.  LEFT foot: Incision is well coapted without any evidence of dehiscence along the medial incision.  On the lateral incision the area that was opened has scabbed over and has healed. There is no surrounding erythema, ascending cellulitis.  No fluctuation crepitation any malodor.  There is no drainage or pus identified today.  There is mild tenderness palpation of the surgical site over the anterior foot along the lateral TN joint. No pain to the CCJ or STJ.  No pain with ankle range of motion or restriction. No pain with calf compression, swelling, warmth, erythema.   Assessment and Plan:  Status post left foot surgery, doing well   -Treatment options discussed including all alternatives, risks, and complications -X-rays obtained reviewed.  Hardware intact.  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  There is increased consolidation along the TNJ and STJ.  -He brought in paperwork for me to complete to act on his behalf to appeal the bone stimulator. This was completed today and faxed to his insurance company.  -Continue with PT.  Leon Nelson  dispensed the brace today and we discussed gradual break in and also gradual transition out of the boot and to eventually wear the brace full time.  -Continue with compression sock -Ice/elevation  Return in about 3 weeks (around 05/25/2019).  Leon Nelson DPM

## 2019-05-08 DIAGNOSIS — R269 Unspecified abnormalities of gait and mobility: Secondary | ICD-10-CM | POA: Diagnosis not present

## 2019-05-08 DIAGNOSIS — M25572 Pain in left ankle and joints of left foot: Secondary | ICD-10-CM | POA: Diagnosis not present

## 2019-05-08 DIAGNOSIS — M25672 Stiffness of left ankle, not elsewhere classified: Secondary | ICD-10-CM | POA: Diagnosis not present

## 2019-05-08 DIAGNOSIS — M25472 Effusion, left ankle: Secondary | ICD-10-CM | POA: Diagnosis not present

## 2019-05-12 DIAGNOSIS — M25672 Stiffness of left ankle, not elsewhere classified: Secondary | ICD-10-CM | POA: Diagnosis not present

## 2019-05-12 DIAGNOSIS — M25572 Pain in left ankle and joints of left foot: Secondary | ICD-10-CM | POA: Diagnosis not present

## 2019-05-12 DIAGNOSIS — R269 Unspecified abnormalities of gait and mobility: Secondary | ICD-10-CM | POA: Diagnosis not present

## 2019-05-12 DIAGNOSIS — M25472 Effusion, left ankle: Secondary | ICD-10-CM | POA: Diagnosis not present

## 2019-05-14 ENCOUNTER — Other Ambulatory Visit: Payer: BLUE CROSS/BLUE SHIELD | Admitting: Orthotics

## 2019-05-14 ENCOUNTER — Other Ambulatory Visit: Payer: Self-pay

## 2019-05-14 ENCOUNTER — Ambulatory Visit (INDEPENDENT_AMBULATORY_CARE_PROVIDER_SITE_OTHER): Payer: BLUE CROSS/BLUE SHIELD | Admitting: Orthotics

## 2019-05-14 DIAGNOSIS — M2141 Flat foot [pes planus] (acquired), right foot: Secondary | ICD-10-CM

## 2019-05-14 DIAGNOSIS — M19079 Primary osteoarthritis, unspecified ankle and foot: Secondary | ICD-10-CM

## 2019-05-14 DIAGNOSIS — S92902K Unspecified fracture of left foot, subsequent encounter for fracture with nonunion: Secondary | ICD-10-CM

## 2019-05-14 DIAGNOSIS — M19072 Primary osteoarthritis, left ankle and foot: Secondary | ICD-10-CM

## 2019-05-14 NOTE — Progress Notes (Signed)
Patient picked  Up shoe to accommodate brace; another shoe ordered [id number]

## 2019-05-15 ENCOUNTER — Telehealth: Payer: Self-pay | Admitting: *Deleted

## 2019-05-15 DIAGNOSIS — M25472 Effusion, left ankle: Secondary | ICD-10-CM | POA: Diagnosis not present

## 2019-05-15 DIAGNOSIS — M25672 Stiffness of left ankle, not elsewhere classified: Secondary | ICD-10-CM | POA: Diagnosis not present

## 2019-05-15 DIAGNOSIS — R269 Unspecified abnormalities of gait and mobility: Secondary | ICD-10-CM | POA: Diagnosis not present

## 2019-05-15 DIAGNOSIS — M25572 Pain in left ankle and joints of left foot: Secondary | ICD-10-CM | POA: Diagnosis not present

## 2019-05-15 MED ORDER — GABAPENTIN 300 MG PO CAPS: 300.0000 mg | ORAL_CAPSULE | Freq: Every day | ORAL | 3 refills | Status: DC

## 2019-05-15 NOTE — Telephone Encounter (Signed)
Pt states his [employer] has been trying to get the Gabapentin 300mg  changed to 90 days. Dr. Berton Lan the change and I changed in Meds & Orders and sent to [employer].

## 2019-05-17 ENCOUNTER — Other Ambulatory Visit: Payer: Self-pay | Admitting: Internal Medicine

## 2019-05-19 DIAGNOSIS — M25472 Effusion, left ankle: Secondary | ICD-10-CM | POA: Diagnosis not present

## 2019-05-19 DIAGNOSIS — M25672 Stiffness of left ankle, not elsewhere classified: Secondary | ICD-10-CM | POA: Diagnosis not present

## 2019-05-19 DIAGNOSIS — M25572 Pain in left ankle and joints of left foot: Secondary | ICD-10-CM | POA: Diagnosis not present

## 2019-05-19 DIAGNOSIS — R269 Unspecified abnormalities of gait and mobility: Secondary | ICD-10-CM | POA: Diagnosis not present

## 2019-05-20 ENCOUNTER — Other Ambulatory Visit: Payer: Self-pay

## 2019-05-20 ENCOUNTER — Ambulatory Visit: Payer: BLUE CROSS/BLUE SHIELD | Admitting: Orthotics

## 2019-05-20 DIAGNOSIS — M2142 Flat foot [pes planus] (acquired), left foot: Secondary | ICD-10-CM

## 2019-05-20 DIAGNOSIS — M2141 Flat foot [pes planus] (acquired), right foot: Secondary | ICD-10-CM

## 2019-05-20 NOTE — Progress Notes (Signed)
Re order shoes for az brrace 13 xw

## 2019-05-22 DIAGNOSIS — R269 Unspecified abnormalities of gait and mobility: Secondary | ICD-10-CM | POA: Diagnosis not present

## 2019-05-22 DIAGNOSIS — M25672 Stiffness of left ankle, not elsewhere classified: Secondary | ICD-10-CM | POA: Diagnosis not present

## 2019-05-22 DIAGNOSIS — M25472 Effusion, left ankle: Secondary | ICD-10-CM | POA: Diagnosis not present

## 2019-05-22 DIAGNOSIS — M25572 Pain in left ankle and joints of left foot: Secondary | ICD-10-CM | POA: Diagnosis not present

## 2019-05-25 ENCOUNTER — Other Ambulatory Visit: Payer: Self-pay | Admitting: Podiatry

## 2019-05-25 ENCOUNTER — Encounter: Payer: Self-pay | Admitting: Podiatry

## 2019-05-25 ENCOUNTER — Telehealth: Payer: Self-pay | Admitting: *Deleted

## 2019-05-25 ENCOUNTER — Other Ambulatory Visit: Payer: Self-pay

## 2019-05-25 ENCOUNTER — Ambulatory Visit (INDEPENDENT_AMBULATORY_CARE_PROVIDER_SITE_OTHER): Payer: BLUE CROSS/BLUE SHIELD

## 2019-05-25 ENCOUNTER — Ambulatory Visit (INDEPENDENT_AMBULATORY_CARE_PROVIDER_SITE_OTHER): Payer: BLUE CROSS/BLUE SHIELD | Admitting: Podiatry

## 2019-05-25 VITALS — Temp 97.0°F

## 2019-05-25 DIAGNOSIS — M19072 Primary osteoarthritis, left ankle and foot: Secondary | ICD-10-CM

## 2019-05-25 DIAGNOSIS — E1149 Type 2 diabetes mellitus with other diabetic neurological complication: Secondary | ICD-10-CM

## 2019-05-25 DIAGNOSIS — M25571 Pain in right ankle and joints of right foot: Secondary | ICD-10-CM

## 2019-05-25 DIAGNOSIS — Z981 Arthrodesis status: Secondary | ICD-10-CM | POA: Diagnosis not present

## 2019-05-25 DIAGNOSIS — M2142 Flat foot [pes planus] (acquired), left foot: Secondary | ICD-10-CM

## 2019-05-25 DIAGNOSIS — M19079 Primary osteoarthritis, unspecified ankle and foot: Secondary | ICD-10-CM | POA: Diagnosis not present

## 2019-05-25 DIAGNOSIS — M25572 Pain in left ankle and joints of left foot: Secondary | ICD-10-CM

## 2019-05-25 DIAGNOSIS — G8929 Other chronic pain: Secondary | ICD-10-CM

## 2019-05-25 DIAGNOSIS — M2141 Flat foot [pes planus] (acquired), right foot: Secondary | ICD-10-CM

## 2019-05-25 DIAGNOSIS — M79672 Pain in left foot: Secondary | ICD-10-CM

## 2019-05-25 MED ORDER — VITAMIN D (ERGOCALCIFEROL) 1.25 MG (50000 UNIT) PO CAPS: ORAL_CAPSULE | ORAL | 2 refills | Status: DC

## 2019-05-25 NOTE — Progress Notes (Signed)
Subjective: Leon Nelson is a 63 y.o. is seen today in office s/p left foot triple arthrodesis preformed on 6/96/7893 thus complicated with postop rhabdomyolysis.  Overall he states he is improving.  He has been going to physical therapy which is been helpful.  He is also been wearing the [place] brace.  He gets some occasional discomfort but he states is more just general aches and pains that he gets.  His pain level at max is 3/10.  There is no swelling or redness.  He is also asking for a brace on the right side.  He does not want to do surgery side but he has the same pain on the right foot that he was having left foot.  His right foot is actually been causing more discomfort since he has been putting more pressure on the right foot.  He gets pain more to the anterior aspect of the ankle.  He denies any fevers, chills, nausea, vomiting.  No calf pain, chest pain, shortness of breath.  No other concerns.  Objective: General: No acute distress, AAOx3  DP/PT pulses palpable 2/4, CRT < 3 sec to all digits.  Protective sensation intact. Motor function intact.  LEFT foot: Incision is well coapted without any evidence of dehiscence along the opening incisions.  Scars are well formed.  There is mild discomfort on the dorsal aspect of foot, anterior ankle there is no specific area of pinpoint tenderness.  This is where he was having significant discomfort prior to surgery.  No significant edema there is no erythema or warmth. RIGHT foot: Positive for his present with decreased appetite and range of motion.  Tenderness on the anterior ankle joint. No pain with calf compression, swelling, warmth, erythema.   Assessment and Plan:  Status post left foot surgery, doing well   -Treatment options discussed including all alternatives, risks, and complications -X-rays obtained reviewed.  Hardware intact.  There is increased consolidation of the arthrodesis site particularly the calcaneocuboid joint.  Still  radiolucent line is evident however. -On the left foot continue with [place] brace and supportive shoes.  Continue physical therapy.  He was scheduled for back to work today.  After long discussion regards options he does feel that he is ready to go to work however on a limited basis.  Will return to work on Thursday, June 4 with next standing of 3 hours a day.  He states he had spoken to work about this matter able to accommodate him. -Given the deformity on the right side as well as the pain will continue with conservative care.  I do think he benefit from [place] he is requesting this as well.  Record saw him today and he was measured for this.   Return in about 4 weeks (around 06/22/2019).  I will also check on him while he is in physical therapy  Trula Slade DPM

## 2019-05-25 NOTE — Telephone Encounter (Signed)
Refill of vitamin D and  was ok'd by Dr Jacqualyn Posey and sent over 10 qty and 2 more refills. Leon Nelson

## 2019-05-26 ENCOUNTER — Ambulatory Visit: Payer: BLUE CROSS/BLUE SHIELD | Admitting: Orthotics

## 2019-05-26 DIAGNOSIS — M2141 Flat foot [pes planus] (acquired), right foot: Secondary | ICD-10-CM

## 2019-05-26 DIAGNOSIS — M25472 Effusion, left ankle: Secondary | ICD-10-CM | POA: Diagnosis not present

## 2019-05-26 DIAGNOSIS — G8929 Other chronic pain: Secondary | ICD-10-CM

## 2019-05-26 DIAGNOSIS — M25572 Pain in left ankle and joints of left foot: Secondary | ICD-10-CM | POA: Diagnosis not present

## 2019-05-26 DIAGNOSIS — M25571 Pain in right ankle and joints of right foot: Secondary | ICD-10-CM

## 2019-05-26 DIAGNOSIS — M25672 Stiffness of left ankle, not elsewhere classified: Secondary | ICD-10-CM | POA: Diagnosis not present

## 2019-05-26 DIAGNOSIS — Z981 Arthrodesis status: Secondary | ICD-10-CM

## 2019-05-26 DIAGNOSIS — R269 Unspecified abnormalities of gait and mobility: Secondary | ICD-10-CM | POA: Diagnosis not present

## 2019-05-26 DIAGNOSIS — M19079 Primary osteoarthritis, unspecified ankle and foot: Secondary | ICD-10-CM

## 2019-05-26 NOTE — Progress Notes (Signed)
Picked up shoes and recast for brace RIGHT foot.

## 2019-05-31 DIAGNOSIS — M25571 Pain in right ankle and joints of right foot: Secondary | ICD-10-CM | POA: Diagnosis not present

## 2019-05-31 DIAGNOSIS — G8929 Other chronic pain: Secondary | ICD-10-CM | POA: Diagnosis not present

## 2019-05-31 DIAGNOSIS — M19079 Primary osteoarthritis, unspecified ankle and foot: Secondary | ICD-10-CM | POA: Diagnosis not present

## 2019-06-02 DIAGNOSIS — M25572 Pain in left ankle and joints of left foot: Secondary | ICD-10-CM | POA: Diagnosis not present

## 2019-06-02 DIAGNOSIS — M25672 Stiffness of left ankle, not elsewhere classified: Secondary | ICD-10-CM | POA: Diagnosis not present

## 2019-06-02 DIAGNOSIS — R269 Unspecified abnormalities of gait and mobility: Secondary | ICD-10-CM | POA: Diagnosis not present

## 2019-06-02 DIAGNOSIS — M25472 Effusion, left ankle: Secondary | ICD-10-CM | POA: Diagnosis not present

## 2019-06-04 ENCOUNTER — Other Ambulatory Visit (INDEPENDENT_AMBULATORY_CARE_PROVIDER_SITE_OTHER): Payer: BC Managed Care – PPO

## 2019-06-04 ENCOUNTER — Encounter: Payer: Self-pay | Admitting: Internal Medicine

## 2019-06-04 ENCOUNTER — Ambulatory Visit (INDEPENDENT_AMBULATORY_CARE_PROVIDER_SITE_OTHER): Payer: BC Managed Care – PPO | Admitting: Internal Medicine

## 2019-06-04 ENCOUNTER — Other Ambulatory Visit: Payer: Self-pay

## 2019-06-04 ENCOUNTER — Ambulatory Visit: Payer: Self-pay

## 2019-06-04 VITALS — BP 126/78 | HR 70 | Temp 97.8°F | Ht 72.0 in | Wt 311.0 lb

## 2019-06-04 DIAGNOSIS — E559 Vitamin D deficiency, unspecified: Secondary | ICD-10-CM | POA: Diagnosis not present

## 2019-06-04 DIAGNOSIS — R55 Syncope and collapse: Secondary | ICD-10-CM | POA: Diagnosis not present

## 2019-06-04 DIAGNOSIS — I10 Essential (primary) hypertension: Secondary | ICD-10-CM

## 2019-06-04 DIAGNOSIS — E611 Iron deficiency: Secondary | ICD-10-CM

## 2019-06-04 DIAGNOSIS — E1149 Type 2 diabetes mellitus with other diabetic neurological complication: Secondary | ICD-10-CM

## 2019-06-04 DIAGNOSIS — E538 Deficiency of other specified B group vitamins: Secondary | ICD-10-CM

## 2019-06-04 DIAGNOSIS — R1031 Right lower quadrant pain: Secondary | ICD-10-CM | POA: Insufficient documentation

## 2019-06-04 LAB — CBC WITH DIFFERENTIAL/PLATELET
Basophils Absolute: 0 10*3/uL (ref 0.0–0.1)
Basophils Relative: 0.3 % (ref 0.0–3.0)
Eosinophils Absolute: 0.5 10*3/uL (ref 0.0–0.7)
Eosinophils Relative: 5.3 % — ABNORMAL HIGH (ref 0.0–5.0)
HCT: 40.5 % (ref 39.0–52.0)
Hemoglobin: 13.5 g/dL (ref 13.0–17.0)
Lymphocytes Relative: 29.9 % (ref 12.0–46.0)
Lymphs Abs: 2.7 10*3/uL (ref 0.7–4.0)
MCHC: 33.2 g/dL (ref 30.0–36.0)
MCV: 88.3 fl (ref 78.0–100.0)
Monocytes Absolute: 0.8 10*3/uL (ref 0.1–1.0)
Monocytes Relative: 9.5 % (ref 3.0–12.0)
Neutro Abs: 4.9 10*3/uL (ref 1.4–7.7)
Neutrophils Relative %: 55 % (ref 43.0–77.0)
Platelets: 225 10*3/uL (ref 150.0–400.0)
RBC: 4.58 Mil/uL (ref 4.22–5.81)
RDW: 14.2 % (ref 11.5–15.5)
WBC: 8.9 10*3/uL (ref 4.0–10.5)

## 2019-06-04 LAB — HEMOGLOBIN A1C: Hgb A1c MFr Bld: 7.3 % — ABNORMAL HIGH (ref 4.6–6.5)

## 2019-06-04 LAB — LIPID PANEL
Cholesterol: 116 mg/dL (ref 0–200)
HDL: 29.4 mg/dL — ABNORMAL LOW (ref >39.00)
NonHDL: 86.45
Total CHOL/HDL Ratio: 4
Triglycerides: 262 mg/dL — ABNORMAL HIGH (ref 0.0–149.0)
VLDL: 52.4 mg/dL — ABNORMAL HIGH (ref 0.0–40.0)

## 2019-06-04 LAB — VITAMIN D 25 HYDROXY (VIT D DEFICIENCY, FRACTURES): VITD: 37.63 ng/mL (ref 30.00–100.00)

## 2019-06-04 LAB — HEPATIC FUNCTION PANEL
ALT: 31 U/L (ref 0–53)
AST: 28 U/L (ref 0–37)
Albumin: 4 g/dL (ref 3.5–5.2)
Alkaline Phosphatase: 58 U/L (ref 39–117)
Bilirubin, Direct: 0.1 mg/dL (ref 0.0–0.3)
Total Bilirubin: 0.4 mg/dL (ref 0.2–1.2)
Total Protein: 6.7 g/dL (ref 6.0–8.3)

## 2019-06-04 LAB — IBC PANEL
Iron: 86 ug/dL (ref 42–165)
Saturation Ratios: 27.5 % (ref 20.0–50.0)
Transferrin: 223 mg/dL (ref 212.0–360.0)

## 2019-06-04 LAB — BASIC METABOLIC PANEL
BUN: 20 mg/dL (ref 6–23)
CO2: 29 mEq/L (ref 19–32)
Calcium: 8.9 mg/dL (ref 8.4–10.5)
Chloride: 107 mEq/L (ref 96–112)
Creatinine, Ser: 0.77 mg/dL (ref 0.40–1.50)
GFR: 101.56 mL/min (ref >60.00)
Glucose, Bld: 119 mg/dL — ABNORMAL HIGH (ref 70–99)
Potassium: 3.5 mEq/L (ref 3.5–5.1)
Sodium: 144 mEq/L (ref 135–145)

## 2019-06-04 LAB — LDL CHOLESTEROL, DIRECT: Direct LDL: 65 mg/dL

## 2019-06-04 LAB — TSH: TSH: 2.14 u[IU]/mL (ref 0.35–4.50)

## 2019-06-04 LAB — LIPASE: Lipase: 31 U/L (ref 11.0–59.0)

## 2019-06-04 LAB — VITAMIN B12: Vitamin B-12: 253 pg/mL (ref 211–911)

## 2019-06-04 NOTE — Patient Instructions (Addendum)
Your EKG was normal today  Please continue all other medications as before, and refills have been done if requested.  Please have the pharmacy call with any other refills you may need.  Please continue your efforts at being more active, low cholesterol diet, and weight control.  You are otherwise up to date with prevention measures today.  Please keep your appointments with your specialists as you may have planned  You will be contacted regarding the referral for: Cardiology, Echocardiogram, and the CT scan for the abdomen/pelvis (to see [facility] now)  Please go to the LAB in the Basement (turn left off the elevator) for the tests to be done today  You will be contacted by phone if any changes need to be made immediately.  Otherwise, you will receive a letter about your results with an explanation, but please check with MyChart first.  Please remember to sign up for MyChart if you have not done so, as this will be important to you in the future with finding out test results, communicating by private email, and scheduling acute appointments online when needed.  Please return in 3 months, or sooner if needed  You are given the work note today

## 2019-06-04 NOTE — Assessment & Plan Note (Signed)
Etiology unclear, cant r/o renal stone vs divertiiculitis vs other - for stat CT abd/pelvis, labs as ordered  Note:  Total time for pt hx, exam, review of record with pt in the room, determination of diagnoses and plan for further eval and tx is > 40 min, with over 50% spent in coordination and counseling of patient including the differential dx, tx, further evaluation and other management of abd pain RLQ and syncope, HTN, Dm

## 2019-06-04 NOTE — Assessment & Plan Note (Signed)
stable overall by history and exam, recent data reviewed with pt, and pt to continue medical treatment as before,  to f/u any worsening symptoms or concerns  

## 2019-06-04 NOTE — Telephone Encounter (Signed)
[place] for OV if he desires, thanks

## 2019-06-04 NOTE — Assessment & Plan Note (Signed)
Etiology unclear, ecg reviewed, for echo, cardiology referral and labs as ordered

## 2019-06-04 NOTE — Progress Notes (Signed)
Subjective:    Patient ID: Leon Nelson, male    DOB: 18-May-1956, 63 y.o.   MRN: [id number]  HPI  Here to f/u with acute onset RLQ pain, mod to severe, crampy, constant, not really better to sit, just had gone back to work 2 days prior after being out sincde dec 2019 due to leg issues; Pain seemed to resolve, but then came back the next day, seemed to radiate to the right back as well.  Also had some bilateral leg cramps.  Denies urinary symptoms such as dysuria, frequency, urgency, flank pain, hematuria or n/v, fever, chills.  Denies worsening reflux, abd pain, dysphagia, n/v, bowel change or blood, though does have loose stools and constipation off and on due to IBS.   Also with total 3 "spells" of sudden onset weakness, and almost passing out, "going blank" and dizziness.  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  Almost fell off the tugger and scared him.   Pt denies chest pain, increased sob or doe, wheezing, orthopnea, PND, increased LE swelling, palpitations, or syncope.   Pt denies fever, wt loss, night sweats, loss of appetite, or other constitutional symptoms.  Past Medical History:  Diagnosis Date  . ABDOMINAL PAIN OTHER SPECIFIED SITE 07/20/2008  . CHEST PAIN 02/06/2011  . COLONIC POLYPS, HX OF 05/26/2008  . DEPRESSION 05/26/2008  . DIABETES MELLITUS, TYPE II 05/26/2008  . HYPERLIPIDEMIA 05/26/2008  . HYPERTENSION 05/26/2008  . HYPOTHYROIDISM 05/26/2008  . KNEE PAIN, RIGHT, ACUTE 07/01/2008  . Personal history of urinary calculi 05/26/2008  . UTI'S, HX OF 05/26/2008   Past Surgical History:  Procedure Laterality Date  . s/p right knee arthroplasty      complicated by patellar tendon rupture Jan 2011 Dr. Gladstone Lighter  . TONSILLECTOMY      reports that he has never smoked. He has never used smokeless tobacco. He reports that he does not drink alcohol or use drugs. family history  includes Alcohol abuse in an other family member; Cancer in his mother; Diabetes in an other family member; Heart disease in his father and mother; Hyperlipidemia in his father and mother. Allergies  Allergen Reactions  . Penicillins Other (See Comments)    Blisters on hands  . Sulfonamide Derivatives Other (See Comments)    Blisters in groin area   Current Outpatient Medications on File Prior to Visit  Medication Sig Dispense Refill  . allopurinol (ZYLOPRIM) 300 MG tablet Take 1 tablet (300 mg total) by mouth daily. 30 tablet 0  . atenolol-chlorthalidone (TENORETIC) 50-25 MG tablet Take 0.5 tablets by mouth daily. TAKE ONE-HALF (1/2) TABLET DAILY 30 tablet 0  . bisacodyl (DULCOLAX) 10 MG suppository Place 1 suppository (10 mg total) rectally daily as needed for moderate constipation. 12 suppository 0  . Blood Glucose Monitoring Suppl (ONE TOUCH ULTRA 2) w/Device KIT Use as directed once daily E11.9 1 each 0  . diclofenac sodium (VOLTAREN) 1 % GEL Apply 2 g topically 4 (four) times daily as needed. (Patient taking differently: Apply 2 g topically 4 (four) times daily as needed (pain). ) 200 g 5  . dicyclomine (BENTYL) 20 MG tablet TAKE 1 TABLET BY MOUTH THREE TIMES DAILY AS NEEDED (Patient taking differently: Take 20 mg by mouth daily. ) 90 tablet 3  . enoxaparin (LOVENOX) 40 MG/0.4ML injection Inject 0.4 mLs (40 mg total) into the skin daily. 7  Syringe 0  . enoxaparin (LOVENOX) 80 MG/0.8ML injection Inject 0.7 mLs (70 mg total) into the skin daily. 24 mL 0  . gabapentin (NEURONTIN) 300 MG capsule Take 1 capsule (300 mg total) by mouth at bedtime. 90 capsule 3  . glipiZIDE (GLUCOTROL XL) 10 MG 24 hr tablet TAKE 1 TABLET DAILY WITH BREAKFAST (Patient taking differently: Take 10 mg by mouth daily. ) 90 tablet 3  . glucose blood (ONE TOUCH ULTRA TEST) test strip Use as instructed once daily E11.9 100 each 12  . Lancets MISC Use as directed once daily E11.9 100 each 3  . levothyroxine (SYNTHROID,  LEVOTHROID) 50 MCG tablet TAKE 1 TABLET DAILY 90 tablet 0  . lisinopril (ZESTRIL) 20 MG tablet TAKE 1 TABLET DAILY 90 tablet 1  . metFORMIN (GLUCOPHAGE-XR) 500 MG 24 hr tablet TAKE 3 TABLETS IN THE MORNING (Patient taking differently: Take 1,500 mg by mouth daily. ) 270 tablet 1  . oxyCODONE-acetaminophen (PERCOCET/ROXICET) 5-325 MG tablet Take 1 tablet by mouth every 4 (four) hours as needed for moderate pain. 30 tablet 0  . polyethylene glycol (MIRALAX / GLYCOLAX) packet Take 17 g by mouth daily. 14 each 0  . potassium chloride SA (K-DUR) 20 MEQ tablet     . senna-docusate (SENOKOT-S) 8.6-50 MG tablet Take 2 tablets by mouth 2 (two) times daily. 120 tablet 0  . sildenafil (VIAGRA) 100 MG tablet Take 0.5-1 tablets (50-100 mg total) by mouth daily as needed. (Patient taking differently: Take 50-100 mg by mouth daily as needed for erectile dysfunction. ) 10 tablet 0  . simvastatin (ZOCOR) 80 MG tablet TAKE 1 TABLET AT BEDTIME 90 tablet 1  . venlafaxine XR (EFFEXOR-XR) 75 MG 24 hr capsule Take 1 capsule (75 mg total) by mouth daily. 30 capsule 0  . Vitamin D, Ergocalciferol, (DRISDOL) 1.25 MG (50000 UT) CAPS capsule TAKE 1 CAPSULE BY MOUTH EVERY 7 DAYS 10 capsule 2   No current facility-administered medications on file prior to visit.    Review of Systems  Constitutional: Negative for other unusual diaphoresis or sweats HENT: Negative for ear discharge or swelling Eyes: Negative for other worsening visual disturbances Respiratory: Negative for stridor or other swelling  Gastrointestinal: Negative for worsening distension or other blood Genitourinary: Negative for retention or other urinary change Musculoskeletal: Negative for other MSK pain or swelling Skin: Negative for color change or other new lesions Neurological: Negative for worsening tremors and other numbness  Psychiatric/Behavioral: Negative for worsening agitation or other fatigue All other system neg per pt    Objective:    Physical Exam BP 126/78   Pulse 70   Temp 97.8 F (36.6 C) (Oral)   Ht 6' (1.829 m)   Wt (!) 311 lb (141.1 kg)   SpO2 96%   BMI 42.18 kg/m  VS noted, non toxic or ill appearing Constitutional: Pt appears in NAD HENT: Head: NCAT.  Right Ear: External ear normal.  Left Ear: External ear normal.  Eyes: . Pupils are equal, round, and reactive to light. Conjunctivae and EOM are normal Nose: without d/c or deformity Neck: Neck supple. Gross normal ROM Cardiovascular: Normal rate and regular rhythm.   Pulmonary/Chest: Effort normal and breath sounds without rales or wheezing.  Abd:  Soft, ND, + BS, no organomegaly but has mild "soreness" to RLQ, no guarding or rebound Neurological: Pt is alert. At baseline orientation, motor grossly intact, cn 2-12 intact, motor 5/5 Skin: Skin is warm. No rashes, other new lesions, no LE edema Psychiatric: Pt  behavior is normal without agitation , mild nervous No other exam findings  Lab Results  Component Value Date   WBC 8.3 02/16/2019   HGB 14.4 02/16/2019   HCT 42.6 02/16/2019   PLT 277.0 02/16/2019   GLUCOSE 128 (H) 02/16/2019   CHOL 125 10/27/2018   TRIG 134.0 10/27/2018   HDL 33.10 (L) 10/27/2018   LDLDIRECT 78.0 04/17/2018   LDLCALC 65 10/27/2018   ALT 39 02/16/2019   AST 23 02/16/2019   NA 142 02/16/2019   K 3.3 (L) 02/16/2019   CL 103 02/16/2019   CREATININE 0.76 02/16/2019   BUN 19 02/16/2019   CO2 30 02/16/2019   TSH 2.63 10/27/2018   PSA 0.11 10/27/2018   INR 2.0 ratio (H) 01/16/2010   HGBA1C 6.7 (H) 10/27/2018   MICROALBUR 1.4 10/27/2018       Assessment & Plan:

## 2019-06-04 NOTE — Telephone Encounter (Signed)
Patient called and says since Saturday, he's been having right lower abdominal cramping that moves to the lower back. He says the pain is a 10 and he just bears through the pain until it goes away. He says he had the pain last night and no pain today. He denies any other symptoms with the pain. I called and spoke to Sam, [facility] who asked to speak to the patient, the call was connected successfully.  Answer Assessment - Initial Assessment Questions 1. LOCATION: "Where does it hurt?"      Lower right abdomen 2. RADIATION: "Does the pain shoot anywhere else?" (e.g., chest, back)     Lower back, left leg 3. ONSET: "When did the pain begin?" (Minutes, hours or days ago)      Over the weekend, coming and going; last night it was there 4. SUDDEN: "Gradual or sudden onset?"     Gradual 5. PATTERN "Does the pain come and go, or is it constant?"    - If constant: "Is it getting better, staying the same, or worsening?"      (Note: Constant means the pain never goes away completely; most serious pain is constant and it progresses)     - If intermittent: "How long does it last?" "Do you have pain now?"     (Note: Intermittent means the pain goes away completely between bouts)     Comes and goes 6. SEVERITY: "How bad is the pain?"  (e.g., Scale 1-10; mild, moderate, or severe)    - MILD (1-3): doesn't interfere with normal activities, abdomen soft and not tender to touch     - MODERATE (4-7): interferes with normal activities or awakens from sleep, tender to touch     - SEVERE (8-10): excruciating pain, doubled over, unable to do any normal activities       10 when the pain is there, no pain at this time 7. RECURRENT SYMPTOM: "Have you ever had this type of abdominal pain before?" If so, ask: "When was the last time?" and "What happened that time?"      No 8. CAUSE: "What do you think is causing the abdominal pain?"     No 9. RELIEVING/AGGRAVATING FACTORS: "What makes it better or worse?" (e.g., movement,  antacids, bowel movement)     Nothing, just bearing through out 10. OTHER SYMPTOMS: "Has there been any vomiting, diarrhea, constipation, or urine problems?"      No  Protocols used: ABDOMINAL PAIN - MALE-A-AH

## 2019-06-05 DIAGNOSIS — M25572 Pain in left ankle and joints of left foot: Secondary | ICD-10-CM | POA: Diagnosis not present

## 2019-06-05 DIAGNOSIS — M25672 Stiffness of left ankle, not elsewhere classified: Secondary | ICD-10-CM | POA: Diagnosis not present

## 2019-06-05 DIAGNOSIS — R269 Unspecified abnormalities of gait and mobility: Secondary | ICD-10-CM | POA: Diagnosis not present

## 2019-06-05 DIAGNOSIS — M25472 Effusion, left ankle: Secondary | ICD-10-CM | POA: Diagnosis not present

## 2019-06-06 ENCOUNTER — Other Ambulatory Visit: Payer: Self-pay | Admitting: Internal Medicine

## 2019-06-09 ENCOUNTER — Other Ambulatory Visit: Payer: Self-pay | Admitting: Internal Medicine

## 2019-06-09 ENCOUNTER — Telehealth: Payer: Self-pay

## 2019-06-09 ENCOUNTER — Encounter: Payer: Self-pay | Admitting: Internal Medicine

## 2019-06-09 ENCOUNTER — Other Ambulatory Visit: Payer: Self-pay

## 2019-06-09 ENCOUNTER — Ambulatory Visit
Admission: RE | Admit: 2019-06-09 | Discharge: 2019-06-09 | Disposition: A | Discharge status: Home / Self Care | Payer: BC Managed Care – PPO | Source: Ambulatory Visit | Attending: Internal Medicine | Admitting: Internal Medicine

## 2019-06-09 DIAGNOSIS — M25472 Effusion, left ankle: Secondary | ICD-10-CM | POA: Diagnosis not present

## 2019-06-09 DIAGNOSIS — M25672 Stiffness of left ankle, not elsewhere classified: Secondary | ICD-10-CM | POA: Diagnosis not present

## 2019-06-09 DIAGNOSIS — M25572 Pain in left ankle and joints of left foot: Secondary | ICD-10-CM | POA: Diagnosis not present

## 2019-06-09 DIAGNOSIS — R269 Unspecified abnormalities of gait and mobility: Secondary | ICD-10-CM | POA: Diagnosis not present

## 2019-06-09 DIAGNOSIS — K7689 Other specified diseases of liver: Secondary | ICD-10-CM | POA: Diagnosis not present

## 2019-06-09 DIAGNOSIS — K760 Fatty (change of) liver, not elsewhere classified: Secondary | ICD-10-CM | POA: Diagnosis not present

## 2019-06-09 DIAGNOSIS — E278 Other specified disorders of adrenal gland: Secondary | ICD-10-CM | POA: Diagnosis not present

## 2019-06-09 DIAGNOSIS — R1031 Right lower quadrant pain: Secondary | ICD-10-CM

## 2019-06-09 DIAGNOSIS — K4090 Unilateral inguinal hernia, without obstruction or gangrene, not specified as recurrent: Secondary | ICD-10-CM | POA: Diagnosis not present

## 2019-06-09 DIAGNOSIS — K769 Liver disease, unspecified: Secondary | ICD-10-CM

## 2019-06-09 MED ORDER — IOPAMIDOL (ISOVUE-300) INJECTION 61%
125.0000 mL | Freq: Once | INTRAVENOUS | Status: AC | PRN
  Administered 2019-06-09: 14:00:00 125 mL via INTRAVENOUS

## 2019-06-09 NOTE — Telephone Encounter (Signed)
Pt has been informed and letter has been sent out in the mail

## 2019-06-09 NOTE — Telephone Encounter (Signed)
Note done hardcopy to shirron  I am not allowed to be specific regarding which tests for what reasons or any diagnosis as this is against the law

## 2019-06-09 NOTE — Telephone Encounter (Signed)
Pt needs an updated letter for his employer stating that he is having test done with a return to work date. Pt would like letter to be mailed to him. Please advise.    Copied from [place] [id number]. Topic: General - Inquiry >> Jun 08, 2019  4:00 PM Berneta Levins wrote: Reason for CRM:   Pt calling to speak with Denham Mose.  States that he needs a more detailed work note per his employer. Pt can be reached at [phone].

## 2019-06-10 ENCOUNTER — Telehealth: Payer: Self-pay

## 2019-06-10 ENCOUNTER — Telehealth: Payer: Self-pay | Admitting: Cardiovascular Disease

## 2019-06-10 NOTE — Telephone Encounter (Signed)
-----   Message from Biagio Borg, MD sent at 06/09/2019  5:17 PM EDT ----- Left message on MyChart, pt to cont same tx except  The test results show that your current treatment is OK, as there are no definite CT abnormalities.  There is the suggestion of probable benign nodules to the adrenal glands, as well as possible large blood vessels in the liver, but we will need an MRI of the abdomen to be sure these things seem benign.  I will place the order, and you should hear from the office as well.  Shirron to please inform pt, I will do order

## 2019-06-10 NOTE — Telephone Encounter (Signed)
smartphone/ my chart via emailed/consent/ pre reg completed

## 2019-06-10 NOTE — Telephone Encounter (Signed)
Pt has been informed of results and expressed understanding.  °

## 2019-06-11 ENCOUNTER — Telehealth (INDEPENDENT_AMBULATORY_CARE_PROVIDER_SITE_OTHER): Payer: BC Managed Care – PPO | Admitting: Cardiovascular Disease

## 2019-06-11 ENCOUNTER — Telehealth: Payer: Self-pay

## 2019-06-11 ENCOUNTER — Other Ambulatory Visit: Payer: Self-pay

## 2019-06-11 ENCOUNTER — Telehealth (HOSPITAL_COMMUNITY): Payer: Self-pay

## 2019-06-11 VITALS — Ht 72.0 in | Wt 311.0 lb

## 2019-06-11 DIAGNOSIS — R55 Syncope and collapse: Secondary | ICD-10-CM | POA: Diagnosis not present

## 2019-06-11 DIAGNOSIS — M5412 Radiculopathy, cervical region: Secondary | ICD-10-CM | POA: Diagnosis not present

## 2019-06-11 DIAGNOSIS — E782 Mixed hyperlipidemia: Secondary | ICD-10-CM | POA: Diagnosis not present

## 2019-06-11 DIAGNOSIS — R200 Anesthesia of skin: Secondary | ICD-10-CM | POA: Diagnosis not present

## 2019-06-11 DIAGNOSIS — R011 Cardiac murmur, unspecified: Secondary | ICD-10-CM | POA: Diagnosis not present

## 2019-06-11 DIAGNOSIS — I10 Essential (primary) hypertension: Secondary | ICD-10-CM | POA: Diagnosis not present

## 2019-06-11 NOTE — Patient Instructions (Addendum)
Medication Instructions:  Your physician recommends that you continue on your current medications as directed. Please refer to the Current Medication list given to you today.  If you need a refill on your cardiac medications before your next appointment, please call your pharmacy.   Lab work: NONE If you have labs (blood work) drawn today and your tests are completely normal, you will receive your results only by: Marland Kitchen MyChart Message (if you have MyChart) OR . A paper copy in the mail If you have any lab test that is abnormal or we need to change your treatment, we will call you to review the results.  Testing/Procedures: Your physician has recommended that you wear a 30-DAY event monitor. Event monitors are medical devices that record the heart's electrical activity. Doctors most often [place] these monitors to diagnose arrhythmias. Arrhythmias are problems with the speed or rhythm of the heartbeat. The monitor is a small, portable device. You can wear one while you do your normal daily activities. This is usually used to diagnose what is causing palpitations/syncope (passing out). YOU WILL BE CONTACTED BY A SCHEDULER TO SET UP AN APPOINTMENT.  Your physician has requested that you have an echocardiogram. Echocardiography is a painless test that uses sound waves to create images of your heart. It provides your doctor with information about the size and shape of your heart and how well your heart's chambers and valves are working. This procedure takes approximately one hour. There are no restrictions for this procedure. SCHEDULED FOR 06/12/2019 AT 1:05PM   Follow-Up: At [facility], you and your health needs are our priority.  As part of our continuing mission to provide you with exceptional heart care, we have created designated Provider Care Teams.  These Care Teams include your primary Cardiologist (physician) and Advanced Practice Providers (APPs -  Physician Assistants and Nurse Practitioners)  who all work together to provide you with the care you need, when you need it. . You will need a follow up appointment in 4-6 weeks with Dr. Gwenlyn Found. YOU WILL RECEIVE A REMINDER TO SET UP THIS APPOINTMENT.

## 2019-06-11 NOTE — Telephone Encounter (Signed)

## 2019-06-11 NOTE — Progress Notes (Signed)
Virtual Visit via Telephone Note   This visit type was conducted due to national recommendations for restrictions regarding the COVID-19 Pandemic (e.g. social distancing) in an effort to limit this patient's exposure and mitigate transmission in our community.  Due to his co-morbid illnesses, this patient is at least at moderate risk for complications without adequate follow up.  This format is felt to be most appropriate for this patient at this time.  The patient did not have access to video technology/had technical difficulties with video requiring transitioning to audio format only (telephone).  All issues noted in this document were discussed and addressed.  No physical exam could be performed with this format.  Please refer to the patient's chart for his  consent to telehealth for [facility].   Date:  06/11/2019   ID:  Leon Nelson, DOB 01-24-1956, MRN [id number]  Patient Location: Home Provider Location: Home  PCP:  Biagio Borg, MD  Cardiologist: Dr. Quay Burow Electrophysiologist:  None   Evaluation Performed:  New Patient Evaluation  Chief Complaint: Presyncope  History of Present Illness:    Leon Nelson is a 63 y.o. severely overweight married Caucasian male father of 2 older daughters and 2 daughters that still live at home, grandfather to 5 grandchildren who was referred by Dr. [name] for cardiovascular valuation because of recent onset of presyncope.  His cardiac risk factors are notable for treated hypertension and hyperlipidemia.  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  His mother apparently had hypertrophic cardiomyopathy and underwent septal myectomy at [facility] in 1997.  Mr. Bissonnette was apparently genetically tested and did not have the gene for this.  His father died in a motor vehicle accident 1999.  He is never had a heart attack or stroke.  He denies chest pain or shortness of breath.  He did have foot surgery  and has been out of work for 6 months, recently went back and is under increased stress.  He began having presyncopal episodes approximately 1 month ago and has had 3 episodes without complete loss of consciousness.  He does have a 2D echocardiogram scheduled for tomorrow.  The patient does not have symptoms concerning for COVID-19 infection (fever, chills, cough, or new shortness of breath).    Past Medical History:  Diagnosis Date   ABDOMINAL PAIN OTHER SPECIFIED SITE 07/20/2008   CHEST PAIN 02/06/2011   COLONIC POLYPS, HX OF 05/26/2008   DEPRESSION 05/26/2008   DIABETES MELLITUS, TYPE II 05/26/2008   HYPERLIPIDEMIA 05/26/2008   HYPERTENSION 05/26/2008   HYPOTHYROIDISM 05/26/2008   KNEE PAIN, RIGHT, ACUTE 07/01/2008   Personal history of urinary calculi 05/26/2008   UTI'S, HX OF 05/26/2008   Past Surgical History:  Procedure Laterality Date   s/p right knee arthroplasty      complicated by patellar tendon rupture Jan 2011 Dr. Gladstone Lighter   TONSILLECTOMY       Current Meds  Medication Sig   allopurinol (ZYLOPRIM) 300 MG tablet Take 1 tablet (300 mg total) by mouth daily.   atenolol-chlorthalidone (TENORETIC) 50-25 MG tablet TAKE ONE-HALF (1/2) TABLET DAILY   Blood Glucose Monitoring Suppl (ONE TOUCH ULTRA 2) w/Device KIT Use as directed once daily E11.9   diclofenac sodium (VOLTAREN) 1 % GEL Apply 2 g topically 4 (four) times daily as needed. (Patient taking differently: Apply 2 g topically 4 (four) times daily as needed (pain). )   dicyclomine (BENTYL) 20 MG tablet TAKE 1 TABLET BY  MOUTH THREE TIMES DAILY AS NEEDED (Patient taking differently: Take 20 mg by mouth daily. )   gabapentin (NEURONTIN) 300 MG capsule Take 1 capsule (300 mg total) by mouth at bedtime.   glipiZIDE (GLUCOTROL XL) 10 MG 24 hr tablet TAKE 1 TABLET DAILY WITH BREAKFAST (Patient taking differently: Take 10 mg by mouth daily. )   glucose blood (ONE TOUCH ULTRA TEST) test strip Use as instructed once daily E11.9     KLOR-CON M20 20 MEQ tablet TAKE 2 TABLETS DAILY (Patient taking differently: Take 30 mEq by mouth daily. )   Lancets MISC Use as directed once daily E11.9   levothyroxine (SYNTHROID, LEVOTHROID) 50 MCG tablet TAKE 1 TABLET DAILY   lisinopril (ZESTRIL) 20 MG tablet TAKE 1 TABLET DAILY   metFORMIN (GLUCOPHAGE-XR) 500 MG 24 hr tablet TAKE 3 TABLETS IN THE MORNING   simvastatin (ZOCOR) 80 MG tablet TAKE 1 TABLET AT BEDTIME   venlafaxine XR (EFFEXOR-XR) 75 MG 24 hr capsule TAKE 1 CAPSULE DAILY   Vitamin D, Ergocalciferol, (DRISDOL) 1.25 MG (50000 UT) CAPS capsule TAKE 1 CAPSULE BY MOUTH EVERY 7 DAYS   [DISCONTINUED] bisacodyl (DULCOLAX) 10 MG suppository Place 1 suppository (10 mg total) rectally daily as needed for moderate constipation.   [DISCONTINUED] enoxaparin (LOVENOX) 40 MG/0.4ML injection Inject 0.4 mLs (40 mg total) into the skin daily.   [DISCONTINUED] enoxaparin (LOVENOX) 80 MG/0.8ML injection Inject 0.7 mLs (70 mg total) into the skin daily.   [DISCONTINUED] oxyCODONE-acetaminophen (PERCOCET/ROXICET) 5-325 MG tablet Take 1 tablet by mouth every 4 (four) hours as needed for moderate pain.   [DISCONTINUED] polyethylene glycol (MIRALAX / GLYCOLAX) packet Take 17 g by mouth daily.   [DISCONTINUED] senna-docusate (SENOKOT-S) 8.6-50 MG tablet Take 2 tablets by mouth 2 (two) times daily.   [DISCONTINUED] sildenafil (VIAGRA) 100 MG tablet Take 0.5-1 tablets (50-100 mg total) by mouth daily as needed. (Patient taking differently: Take 50-100 mg by mouth daily as needed for erectile dysfunction. )     Allergies:   Penicillins and Sulfonamide derivatives   Social History   Tobacco Use   Smoking status: Never Smoker   Smokeless tobacco: Never Used  Substance Use Topics   Alcohol use: No   Drug use: No     Family Hx: The patient's family history includes Alcohol abuse in an other family member; Cancer in his mother; Diabetes in an other family member; Heart disease in  his father and mother; Hyperlipidemia in his father and mother.  ROS:   Please see the history of present illness.     All other systems reviewed and are negative.   Prior CV studies:   The following studies were reviewed today:  None  Labs/Other Tests and Data Reviewed:    EKG:  An ECG dated 06/04/2019 was personally reviewed today and demonstrated:  Normal sinus rhythm at 71 without ST or T wave changes  Recent Labs: 06/04/2019: ALT 31; BUN 20; Creatinine, Ser 0.77; Hemoglobin 13.5; Platelets 225.0; Potassium 3.5; Sodium 144; TSH 2.14   Recent Lipid Panel Lab Results  Component Value Date/Time   CHOL 116 06/04/2019 03:06 PM   TRIG 262.0 (H) 06/04/2019 03:06 PM   HDL 29.40 (L) 06/04/2019 03:06 PM   CHOLHDL 4 06/04/2019 03:06 PM   LDLCALC 65 10/27/2018 08:38 AM   LDLDIRECT 65.0 06/04/2019 03:06 PM    Wt Readings from Last 3 Encounters:  06/11/19 (!) 311 lb (141.1 kg)  06/04/19 (!) 311 lb (141.1 kg)  01/12/19 (!) 319 lb 10.7 oz (  145 kg)     Objective:    Vital Signs:  Ht 6' (1.829 m)    Wt (!) 311 lb (141.1 kg)    BMI 42.18 kg/m    VITAL SIGNS:  reviewed a complete physical exam was not performed today since this was a virtual telemedicine phone visit  ASSESSMENT & PLAN:    1. Presyncope- recent onset of presyncopal spells beginning 1 month ago.  He had a total of 3 spells.  There is no aura or symptoms leading up to these.  They occur randomly and spontaneously last for only a few few seconds at a time.  I am not sure he is totally lost consciousness.  He is under a lot of stress at work.  There is a history of hypertrophic cardiomyopathy in his mother who had a septal myectomy at [facility] in 1997 although apparently he was genetically tested for this and was negative.  He does have a 2D echo scheduled for tomorrow.  I am going to order a 30-day event monitor to further evaluate and rule out an arrhythmogenic cause.  I have counseled him against driving. 2. Essential  hypertension- history of essential hypertension on lisinopril, atenolol and chlorthalidone.  He was not able to check his vital signs at home today. 3. Hyperlipidemia- history of hyperlipidemia on simvastatin with lipid profile performed 06/04/2019 revealing total cholesterol 116, LDL 65 4. Morbid obesity- weight greater than 300 pounds  COVID-19 Education: The signs and symptoms of COVID-19 were discussed with the patient and how to seek care for testing (follow up with PCP or arrange E-visit).  The importance of social distancing was discussed today.  Time:   Today, I have spent 9 minutes with the patient with telehealth technology discussing the above problems.     Medication Adjustments/Labs and Tests Ordered: Current medicines are reviewed at length with the patient today.  Concerns regarding medicines are outlined above.   Tests Ordered: No orders of the defined types were placed in this encounter.   Medication Changes: No orders of the defined types were placed in this encounter.   Follow Up:  In Person in 6 week(s)  Signed, Quay Burow, MD  06/11/2019 8:26 AM    [place]

## 2019-06-11 NOTE — Telephone Encounter (Signed)
Patient and/or DPR-approved person aware of 6/18 AVS instructions and verbalized understanding.  AVS TO [facility]

## 2019-06-12 ENCOUNTER — Ambulatory Visit (HOSPITAL_COMMUNITY): Payer: BC Managed Care – PPO | Attending: Internal Medicine

## 2019-06-12 ENCOUNTER — Telehealth: Payer: Self-pay | Admitting: Internal Medicine

## 2019-06-12 DIAGNOSIS — R55 Syncope and collapse: Secondary | ICD-10-CM | POA: Diagnosis not present

## 2019-06-12 DIAGNOSIS — Z0279 Encounter for issue of other medical certificate: Secondary | ICD-10-CM

## 2019-06-12 NOTE — Telephone Encounter (Signed)
I received [place] Form via fax.  Patient is out of work starting 06/04/19 to 06/22/19, Planning to return to work on 06/23/19.  Forms have been completed & Placed in providers box to review and sign.

## 2019-06-12 NOTE — Telephone Encounter (Signed)
Forms have been signed, Faxed to [place] [phone], Copy sent to scan &charged for.  Patient informed and original will be mailed to patient.

## 2019-06-13 IMAGING — DX DG CHEST 2V
2 series · 2 of 2 positions shown · non-contrast
Comparison: 02/06/2011

CLINICAL DATA: Ankle reconstruction today. Subsequent shortness of
breath and right buttock pain.

EXAM:
CHEST - 2 VIEW

[chest lat]
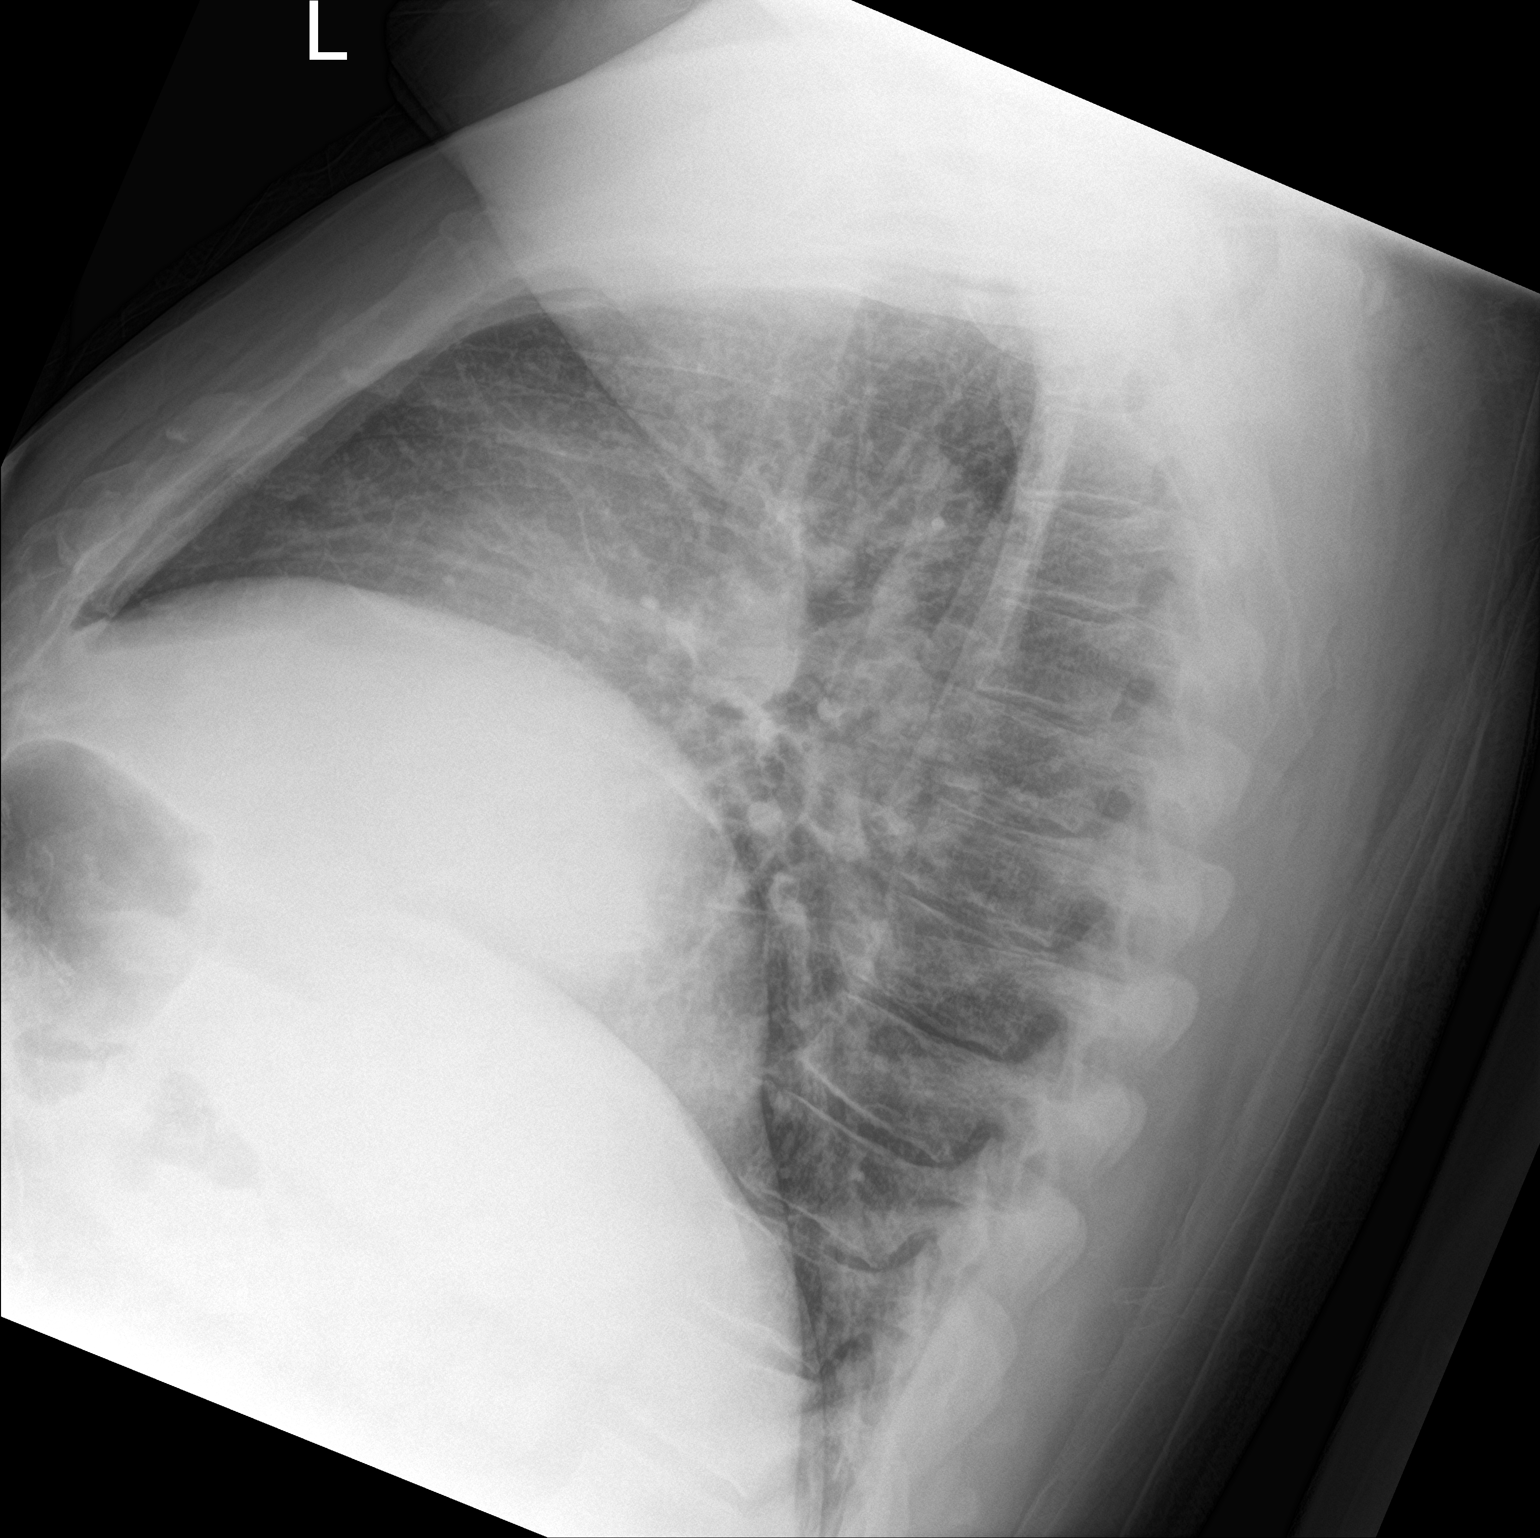

[chest ap]
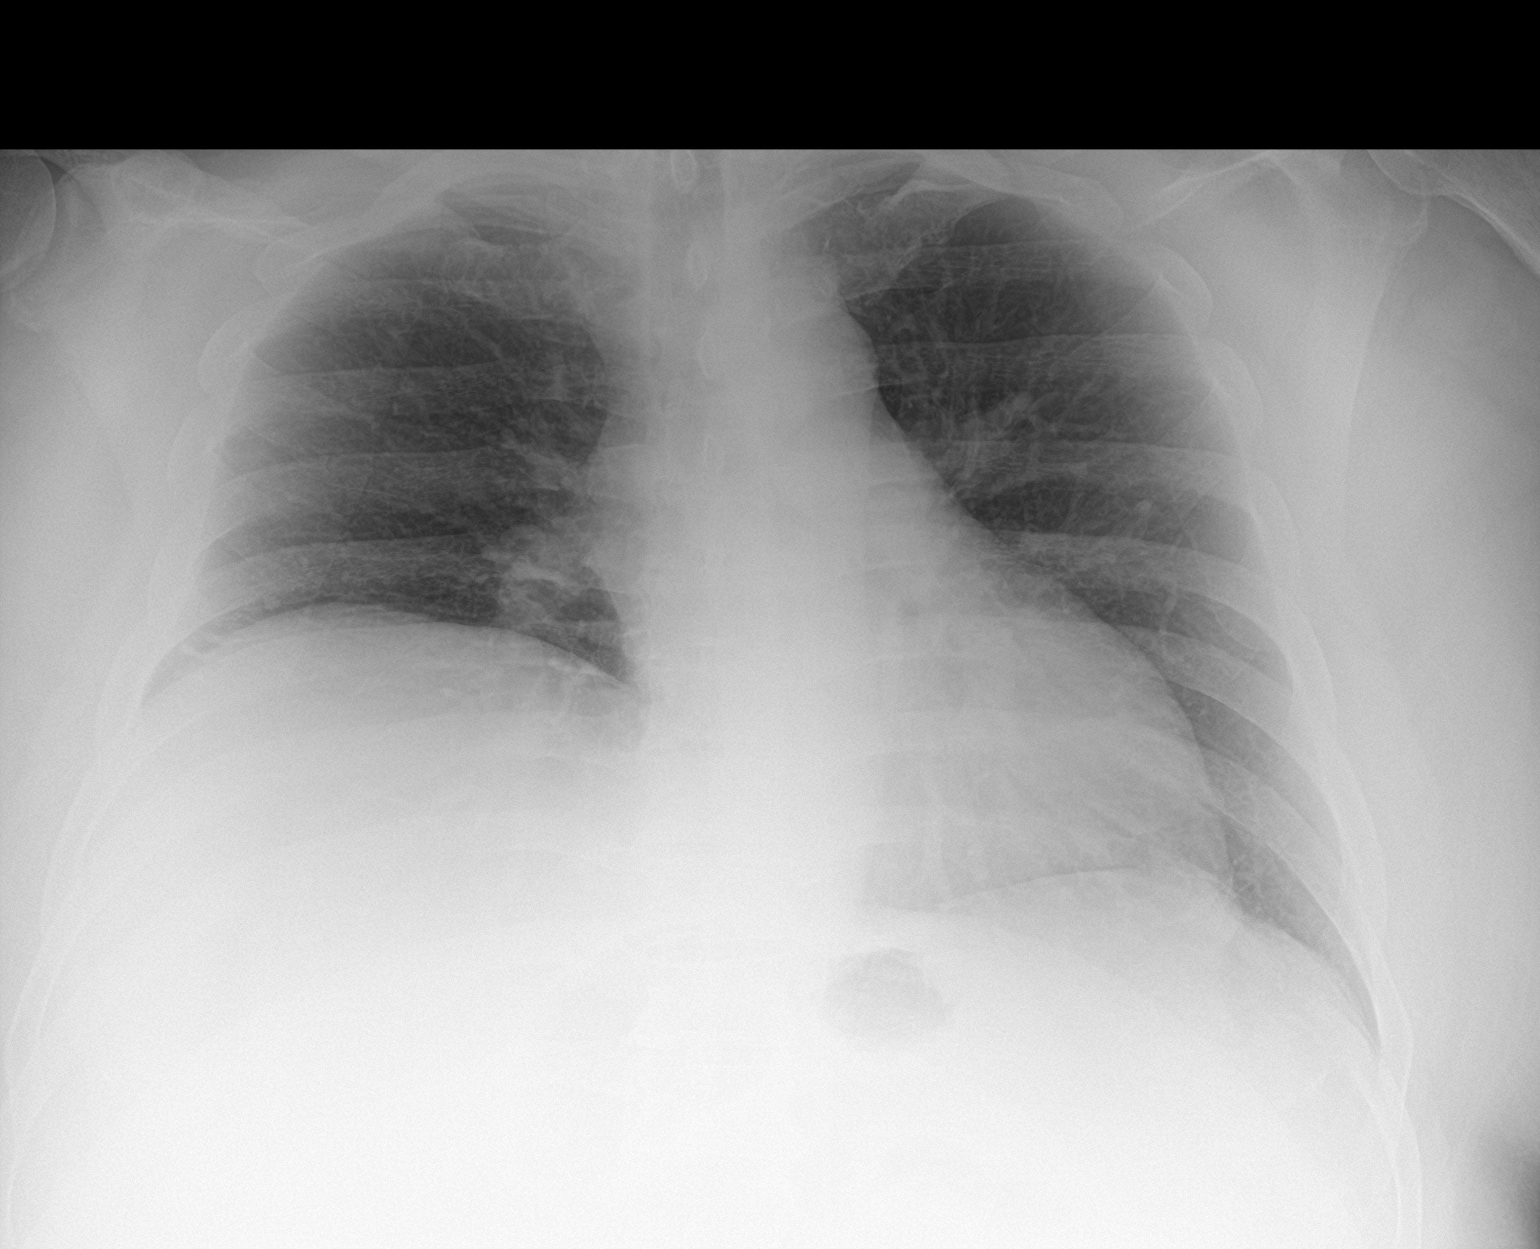

[2 of 2 positions shown; findings below may reference images not displayed]

FINDINGS: Shallow inspiration. The heart size and mediastinal contours are
within normal limits. Both lungs are clear. The visualized skeletal
structures are unremarkable.
IMPRESSION: No active cardiopulmonary disease.

## 2019-06-15 ENCOUNTER — Ambulatory Visit (INDEPENDENT_AMBULATORY_CARE_PROVIDER_SITE_OTHER): Payer: BC Managed Care – PPO | Admitting: Orthotics

## 2019-06-15 ENCOUNTER — Other Ambulatory Visit: Payer: Self-pay

## 2019-06-15 DIAGNOSIS — M19071 Primary osteoarthritis, right ankle and foot: Secondary | ICD-10-CM | POA: Diagnosis not present

## 2019-06-15 DIAGNOSIS — Z981 Arthrodesis status: Secondary | ICD-10-CM | POA: Diagnosis not present

## 2019-06-15 DIAGNOSIS — M2141 Flat foot [pes planus] (acquired), right foot: Secondary | ICD-10-CM

## 2019-06-15 DIAGNOSIS — M19079 Primary osteoarthritis, unspecified ankle and foot: Secondary | ICD-10-CM

## 2019-06-15 NOTE — Progress Notes (Signed)
Patient came in today to pick up standard Afo brace.  Patient was evaluated for fit and function.   The brace fit very well and there were any complaints of the way it felt once donned.  The brace offered ankle stability in both saggital and coroneal planes.  Patient advised to always wear proper fitting shoes with brace. 

## 2019-06-16 ENCOUNTER — Telehealth: Payer: Self-pay | Admitting: *Deleted

## 2019-06-16 ENCOUNTER — Other Ambulatory Visit: Payer: Self-pay | Admitting: Student

## 2019-06-16 DIAGNOSIS — M25572 Pain in left ankle and joints of left foot: Secondary | ICD-10-CM | POA: Diagnosis not present

## 2019-06-16 DIAGNOSIS — M25472 Effusion, left ankle: Secondary | ICD-10-CM | POA: Diagnosis not present

## 2019-06-16 DIAGNOSIS — R269 Unspecified abnormalities of gait and mobility: Secondary | ICD-10-CM | POA: Diagnosis not present

## 2019-06-16 DIAGNOSIS — M5412 Radiculopathy, cervical region: Secondary | ICD-10-CM

## 2019-06-16 DIAGNOSIS — M25672 Stiffness of left ankle, not elsewhere classified: Secondary | ICD-10-CM | POA: Diagnosis not present

## 2019-06-16 NOTE — Telephone Encounter (Signed)
Patient called to request a letter for work regarding his medical restrictions.   He was told he cannot drive for the next 30 days. His work is 33 miles  from his home.  He does not think he imposition someone to drive 002 miles per day to take him back and forth to work. Please call.

## 2019-06-16 NOTE — Telephone Encounter (Signed)
2D echo was essentially nl. No evid of hypertrophic cardiomyopathy like his Mother. Given his 3 episodes of pre syncope, he probably should NOT drive.

## 2019-06-16 NOTE — Telephone Encounter (Signed)
[employer] to ship a 30 day cardiac event monitor to the patients home.  Instructions reviewed briefly as they are included in the monitor kit. 

## 2019-06-17 ENCOUNTER — Telehealth: Payer: Self-pay | Admitting: Cardiovascular Disease

## 2019-06-17 NOTE — Telephone Encounter (Signed)
Per pt Dr Gwenlyn Found told pt no driving  until monitor is complete and pt drives 50 miles one way to work the patient will need a work excuse stating unable to work due to wearing monitor and transportation issues  Will forward to Jory Sims NP for completion and email copy to pt ./cy

## 2019-06-17 NOTE — Telephone Encounter (Signed)
New Message.           Patient has a question about the monitor, pls call to advise

## 2019-06-18 NOTE — Telephone Encounter (Signed)
Letter printed and given to pt.

## 2019-06-18 NOTE — Telephone Encounter (Signed)
Pt received letter with driving restrictions on 6/25

## 2019-06-19 ENCOUNTER — Telehealth: Payer: Self-pay | Admitting: Podiatry

## 2019-06-19 NOTE — Telephone Encounter (Signed)
Pt had to r/s his appointment from this Monday 06/22/2019. He wanted to know if he needed to come back into the office to follow up with Dr. Jacqualyn Posey for his sx that he had in January. Please call patient

## 2019-06-22 ENCOUNTER — Ambulatory Visit: Payer: BLUE CROSS/BLUE SHIELD | Admitting: Podiatry

## 2019-06-22 NOTE — Telephone Encounter (Signed)
Left message for the patient to call me back tomorrow at [phone]. Leon Nelson

## 2019-06-23 ENCOUNTER — Encounter (INDEPENDENT_AMBULATORY_CARE_PROVIDER_SITE_OTHER): Payer: BC Managed Care – PPO

## 2019-06-23 DIAGNOSIS — R55 Syncope and collapse: Secondary | ICD-10-CM

## 2019-06-25 ENCOUNTER — Other Ambulatory Visit: Payer: Self-pay | Admitting: Internal Medicine

## 2019-06-30 DIAGNOSIS — G8929 Other chronic pain: Secondary | ICD-10-CM | POA: Diagnosis not present

## 2019-06-30 DIAGNOSIS — M19079 Primary osteoarthritis, unspecified ankle and foot: Secondary | ICD-10-CM | POA: Diagnosis not present

## 2019-06-30 DIAGNOSIS — M25571 Pain in right ankle and joints of right foot: Secondary | ICD-10-CM | POA: Diagnosis not present

## 2019-07-02 ENCOUNTER — Ambulatory Visit: Payer: BLUE CROSS/BLUE SHIELD | Admitting: Internal Medicine

## 2019-07-02 DIAGNOSIS — R269 Unspecified abnormalities of gait and mobility: Secondary | ICD-10-CM | POA: Diagnosis not present

## 2019-07-02 DIAGNOSIS — M25672 Stiffness of left ankle, not elsewhere classified: Secondary | ICD-10-CM | POA: Diagnosis not present

## 2019-07-02 DIAGNOSIS — M25472 Effusion, left ankle: Secondary | ICD-10-CM | POA: Diagnosis not present

## 2019-07-02 DIAGNOSIS — M25572 Pain in left ankle and joints of left foot: Secondary | ICD-10-CM | POA: Diagnosis not present

## 2019-07-03 DIAGNOSIS — M25672 Stiffness of left ankle, not elsewhere classified: Secondary | ICD-10-CM | POA: Diagnosis not present

## 2019-07-03 DIAGNOSIS — R269 Unspecified abnormalities of gait and mobility: Secondary | ICD-10-CM | POA: Diagnosis not present

## 2019-07-03 DIAGNOSIS — M25472 Effusion, left ankle: Secondary | ICD-10-CM | POA: Diagnosis not present

## 2019-07-03 DIAGNOSIS — M25572 Pain in left ankle and joints of left foot: Secondary | ICD-10-CM | POA: Diagnosis not present

## 2019-07-07 ENCOUNTER — Telehealth: Payer: Self-pay | Admitting: Internal Medicine

## 2019-07-07 ENCOUNTER — Telehealth: Payer: Self-pay | Admitting: Cardiovascular Disease

## 2019-07-07 DIAGNOSIS — R269 Unspecified abnormalities of gait and mobility: Secondary | ICD-10-CM | POA: Diagnosis not present

## 2019-07-07 DIAGNOSIS — M25672 Stiffness of left ankle, not elsewhere classified: Secondary | ICD-10-CM | POA: Diagnosis not present

## 2019-07-07 DIAGNOSIS — M25472 Effusion, left ankle: Secondary | ICD-10-CM | POA: Diagnosis not present

## 2019-07-07 DIAGNOSIS — M25572 Pain in left ankle and joints of left foot: Secondary | ICD-10-CM | POA: Diagnosis not present

## 2019-07-07 NOTE — Telephone Encounter (Signed)
Contacted Comptroller at [employer]. Informed her that nurse did not receive fax. Requested that she fax form to [phone]. She states she will do so

## 2019-07-07 NOTE — Telephone Encounter (Signed)
spoke with emily- she states she faxed form on 06/30/19   aware will  discuss with  Dr Kennon Holter  Nurse and contact her back with information

## 2019-07-07 NOTE — Telephone Encounter (Signed)
New Message   Raquel Sarna from [employer] where the patient works states they faxed over for to be filled out by doctor evaluating the patient's place of work to see whether its ok for patient to be coming into work with his conditions.

## 2019-07-07 NOTE — Telephone Encounter (Signed)
Copied from [place] [id number]. Topic: General - Other >> Jul 07, 2019  3:33 PM Leward Quan A wrote: Reason for CRM: Raquel Sarna from patient employer called to inquire about paperwork that was faxed over to the office on 06/30/2019 asking for a call back with some information. She is asking can Dr Jenny Reichmann please review this paperwork complete and return as soon as possible please patient employment status depend on this. Raquel Sarna can be reached with questions at Ph# [phone]

## 2019-07-07 NOTE — Telephone Encounter (Signed)
7/14 Checked [phone] fax machine and Provider mailbox at the end of work day. No fax fro this pt located

## 2019-07-08 NOTE — Telephone Encounter (Signed)
Patient has an appointment on 07/23/19. He is going to wait until then. He also doesn't think Dr.John is the one who needs to complete this, his other doctor maybe the one. He is going to find out and let is know.

## 2019-07-08 NOTE — Telephone Encounter (Signed)
Spoke with Leon Nelson, Informed her we did not received these forms. She is going to e-mail the forms to me today.

## 2019-07-08 NOTE — Telephone Encounter (Signed)
I decline as the forms list at least 50 different questions to answer regarding his physical ability  This is quite over the top and unreasonable with just a "yes or no" to "can he do the job"  Pt needs OV, as I requested the first time I saw this form, thanks

## 2019-07-09 DIAGNOSIS — M25672 Stiffness of left ankle, not elsewhere classified: Secondary | ICD-10-CM | POA: Diagnosis not present

## 2019-07-09 DIAGNOSIS — M25572 Pain in left ankle and joints of left foot: Secondary | ICD-10-CM | POA: Diagnosis not present

## 2019-07-09 DIAGNOSIS — M25472 Effusion, left ankle: Secondary | ICD-10-CM | POA: Diagnosis not present

## 2019-07-09 DIAGNOSIS — R269 Unspecified abnormalities of gait and mobility: Secondary | ICD-10-CM | POA: Diagnosis not present

## 2019-07-14 ENCOUNTER — Other Ambulatory Visit: Payer: Self-pay

## 2019-07-14 ENCOUNTER — Ambulatory Visit
Admission: RE | Admit: 2019-07-14 | Discharge: 2019-07-14 | Disposition: A | Discharge status: Home / Self Care | Payer: BC Managed Care – PPO | Source: Ambulatory Visit | Attending: Student | Admitting: Student

## 2019-07-14 ENCOUNTER — Ambulatory Visit
Admission: RE | Admit: 2019-07-14 | Discharge: 2019-07-14 | Disposition: A | Discharge status: Home / Self Care | Payer: BC Managed Care – PPO | Source: Ambulatory Visit | Attending: Internal Medicine | Admitting: Internal Medicine

## 2019-07-14 DIAGNOSIS — D3501 Benign neoplasm of right adrenal gland: Secondary | ICD-10-CM | POA: Diagnosis not present

## 2019-07-14 DIAGNOSIS — D3502 Benign neoplasm of left adrenal gland: Secondary | ICD-10-CM | POA: Diagnosis not present

## 2019-07-14 DIAGNOSIS — M5412 Radiculopathy, cervical region: Secondary | ICD-10-CM

## 2019-07-14 DIAGNOSIS — K769 Liver disease, unspecified: Secondary | ICD-10-CM

## 2019-07-14 DIAGNOSIS — M4802 Spinal stenosis, cervical region: Secondary | ICD-10-CM | POA: Diagnosis not present

## 2019-07-14 MED ORDER — GADOBENATE DIMEGLUMINE 529 MG/ML IV SOLN
20.0000 mL | Freq: Once | INTRAVENOUS | Status: AC | PRN
  Administered 2019-07-14: 20 mL via INTRAVENOUS

## 2019-07-17 ENCOUNTER — Telehealth: Payer: Self-pay | Admitting: Cardiovascular Disease

## 2019-07-17 NOTE — Telephone Encounter (Signed)
Spoke with pt, aware left message for [facility] to call. He is also asking about paperwork that was waiting to be signed to go back to [employer]. Will forward to chima, dr berry's nurse to follow up with the patient.

## 2019-07-17 NOTE — Telephone Encounter (Signed)
°  The patient will need Dr. Gwenlyn Found to extend his disability leave.  The patient was told to not work until he receives clearance from Dr. Gwenlyn Found. His leave will end on 07/23/19  The patient has an appointment set up with Dr. Gwenlyn Found on 08/05/19 to go over the results from his heart monitor he is currently wearing. He is set to take the monitor off on 07/30.  Please contact Catha Nottingham at [facility] [phone]. She may also be reached at [email or web address]

## 2019-07-20 NOTE — Telephone Encounter (Signed)
Contacted pt and informed that disability form given to med records who will send to [facility]. Also informed pt that medical recommendation letter will be faxed to the number included on the letter faxed to [facility] office from [place] [phone]), his place of employment.  Pt verbalized understanding. He states he would like nurse to reach out to Catha Nottingham at [employer] [phone] regarding extending his disability leave to a date after his f/u appt with Dr. Gwenlyn Found on 08/05/2019. He states he will be done wearing monitor on 7/29 and will mail it back to company and needs Dr. Gwenlyn Found to determine his disability leave extension after reviewing monitor results. Informed pt that nurse would get in contact with Catha Nottingham and can call him back to update. Pt agreeable with this.  Called Catha Nottingham at [employer] [phone] and left message to call back to discuss pt disability leave extension

## 2019-07-21 DIAGNOSIS — G5603 Carpal tunnel syndrome, bilateral upper limbs: Secondary | ICD-10-CM | POA: Diagnosis not present

## 2019-07-21 DIAGNOSIS — G5623 Lesion of ulnar nerve, bilateral upper limbs: Secondary | ICD-10-CM | POA: Diagnosis not present

## 2019-07-23 ENCOUNTER — Other Ambulatory Visit: Payer: Self-pay

## 2019-07-23 ENCOUNTER — Ambulatory Visit (INDEPENDENT_AMBULATORY_CARE_PROVIDER_SITE_OTHER): Payer: BC Managed Care – PPO | Admitting: Internal Medicine

## 2019-07-23 ENCOUNTER — Encounter: Payer: Self-pay | Admitting: Internal Medicine

## 2019-07-23 DIAGNOSIS — E039 Hypothyroidism, unspecified: Secondary | ICD-10-CM

## 2019-07-23 DIAGNOSIS — E1149 Type 2 diabetes mellitus with other diabetic neurological complication: Secondary | ICD-10-CM | POA: Diagnosis not present

## 2019-07-23 DIAGNOSIS — I10 Essential (primary) hypertension: Secondary | ICD-10-CM | POA: Diagnosis not present

## 2019-07-23 NOTE — Progress Notes (Signed)
Subjective:    Patient ID: Leon Nelson, male    DOB: [birth year], 63 y.o.   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  Still has some right foot pain that may require surgury.  Has also seen Dr Gwenlyn Found who has him out of work to aug 12, when he sees him again.  Has Dr Hassel Neth appt Aug 4 first seen a few yrs ago with left CTS, then found to have cervical radiculitis as well.  Has been much more stressed and anxious. Now states with all the back and forth at work, he has decided to consider retirement in Dec 2020.  Pt denies chest pain, increased sob or doe, wheezing, orthopnea, PND, increased LE swelling, palpitations, dizziness or syncope.  Has random muscle cramping however to legs and abdomen.  Has been walking some to get exercise.   Pt denies polydipsia, polyuria.  Denies worsening depressive symptoms, suicidal ideation, or panic; has ongoing anxiety.  Denies hyper or hypo thyroid symptoms such as voice, skin or hair change.  Note has only worked 6 days this calendar year.   Past Medical History:  Diagnosis Date  . ABDOMINAL PAIN OTHER SPECIFIED SITE 07/20/2008  . CHEST PAIN 02/06/2011  . COLONIC POLYPS, HX OF 05/26/2008  . DEPRESSION 05/26/2008  . DIABETES MELLITUS, TYPE II 05/26/2008  . HYPERLIPIDEMIA 05/26/2008  . HYPERTENSION 05/26/2008  . HYPOTHYROIDISM 05/26/2008  . KNEE PAIN, RIGHT, ACUTE 07/01/2008  . Personal history of urinary calculi 05/26/2008  . UTI'S, HX OF 05/26/2008   Past Surgical History:  Procedure Laterality  Date  . s/p right knee arthroplasty      complicated by patellar tendon rupture Jan 2011 Dr. Gladstone Lighter  . TONSILLECTOMY      reports that he has never smoked. He has never used smokeless tobacco. He reports that he does not drink alcohol or use drugs. family history includes Alcohol abuse in an other family member; Cancer in his mother; Diabetes in an other family member; Heart disease in his father and mother; Hyperlipidemia in his father and mother. Allergies  Allergen Reactions  . Penicillins Other (See Comments)    Blisters on hands  . Sulfonamide Derivatives Other (See Comments)    Blisters in groin area   Current Outpatient Medications on File Prior to Visit  Medication Sig Dispense Refill  . allopurinol (ZYLOPRIM) 300 MG tablet TAKE 1 TABLET DAILY 90 tablet 3  . atenolol-chlorthalidone (TENORETIC) 50-25 MG tablet TAKE ONE-HALF (1/2) TABLET DAILY 45 tablet 3  . Blood Glucose Monitoring Suppl (ONE TOUCH ULTRA 2) w/Device KIT Use as directed once daily E11.9 1 each 0  . diclofenac sodium (VOLTAREN) 1 % GEL Apply 2 g topically 4 (four) times daily as needed. (Patient taking differently: Apply 2 g topically 4 (four) times daily as needed (pain). ) 200 g 5  . dicyclomine (BENTYL) 20 MG tablet TAKE 1 TABLET BY MOUTH THREE TIMES DAILY AS NEEDED (Patient taking differently: Take 20 mg by mouth daily. )  90 tablet 3  . gabapentin (NEURONTIN) 300 MG capsule Take 1 capsule (300 mg total) by mouth at bedtime. 90 capsule 3  . glipiZIDE (GLUCOTROL XL) 10 MG 24 hr tablet TAKE 1 TABLET DAILY WITH BREAKFAST (Patient taking differently: Take 10 mg by mouth daily. ) 90 tablet 3  . glucose blood (ONE TOUCH ULTRA TEST) test strip Use as instructed once daily E11.9 100 each 12  . KLOR-CON M20 20 MEQ tablet TAKE 2 TABLETS DAILY (Patient taking differently: Take 30 mEq by mouth daily. ) 180 tablet 3  . Lancets MISC Use as directed once daily E11.9 100 each 3  . levothyroxine (SYNTHROID) 50 MCG tablet TAKE 1  TABLET DAILY 90 tablet 3  . lisinopril (ZESTRIL) 20 MG tablet TAKE 1 TABLET DAILY 90 tablet 1  . metFORMIN (GLUCOPHAGE-XR) 500 MG 24 hr tablet TAKE 3 TABLETS IN THE MORNING 270 tablet 3  . simvastatin (ZOCOR) 80 MG tablet TAKE 1 TABLET AT BEDTIME 90 tablet 1  . venlafaxine XR (EFFEXOR-XR) 75 MG 24 hr capsule TAKE 1 CAPSULE DAILY 90 capsule 3  . Vitamin D, Ergocalciferol, (DRISDOL) 1.25 MG (50000 UT) CAPS capsule TAKE 1 CAPSULE BY MOUTH EVERY 7 DAYS 10 capsule 2   No current facility-administered medications on file prior to visit.    Review of Systems  Constitutional: Negative for other unusual diaphoresis or sweats HENT: Negative for ear discharge or swelling Eyes: Negative for other worsening visual disturbances Respiratory: Negative for stridor or other swelling  Gastrointestinal: Negative for worsening distension or other blood Genitourinary: Negative for retention or other urinary change Musculoskeletal: Negative for other MSK pain or swelling Skin: Negative for color change or other new lesions Neurological: Negative for worsening tremors and other numbness  Psychiatric/Behavioral: Negative for worsening agitation or other fatigue All other system neg pe rpt    Objective:   Physical Exam BP 128/82   Pulse 73   Temp 98.3 F (36.8 C) (Oral)   Ht 6' (1.829 m)   Wt (!) 313 lb (142 kg)   SpO2 96%   BMI 42.45 kg/m  VS noted,  Constitutional: Pt appears in NAD HENT: Head: NCAT.  Right Ear: External ear normal.  Left Ear: External ear normal.  Eyes: . Pupils are equal, round, and reactive to light. Conjunctivae and EOM are normal Nose: without d/c or deformity Neck: Neck supple. Gross normal ROM Cardiovascular: Normal rate and regular rhythm.   Pulmonary/Chest: Effort normal and breath sounds without rales or wheezing.  Abd:  Soft, NT, ND, + BS, no organomegaly Neurological: Pt is alert. At baseline orientation, motor grossly intact Skin: Skin is warm. No rashes, other new  lesions, no LE edema Psychiatric: Pt behavior is normal without agitation  No other exam findings Lab Results  Component Value Date   WBC 8.9 06/04/2019   HGB 13.5 06/04/2019   HCT 40.5 06/04/2019   PLT 225.0 06/04/2019   GLUCOSE 119 (H) 06/04/2019   CHOL 116 06/04/2019   TRIG 262.0 (H) 06/04/2019   HDL 29.40 (L) 06/04/2019   LDLDIRECT 65.0 06/04/2019   LDLCALC 65 10/27/2018   ALT 31 06/04/2019   AST 28 06/04/2019   NA 144 06/04/2019   K 3.5 06/04/2019   CL 107 06/04/2019   CREATININE 0.77 06/04/2019   BUN 20 06/04/2019   CO2 29 06/04/2019   TSH 2.14 06/04/2019   PSA 0.11 10/27/2018   INR 2.0 ratio (H) 01/16/2010   HGBA1C 7.3 (H) 06/04/2019   MICROALBUR 1.4 10/27/2018  Assessment & Plan:

## 2019-07-23 NOTE — Assessment & Plan Note (Signed)
stable overall by history and exam, recent data reviewed with pt, and pt to continue medical treatment as before,  to f/u any worsening symptoms or concerns  

## 2019-07-23 NOTE — Patient Instructions (Signed)
We'll hold off for now on a letter to your employer regarding work ability  Please make sure to f/u with Dr Gwenlyn Found, Dr Ronnald Ramp and Dr Jacqualyn Posey regarding need for possible further surgury  Please continue all other medications as before, and refills have been done if requested.  Please have the pharmacy call with any other refills you may need.  Please continue your efforts at being more active, low diabetic cholesterol diet, and weight control.  Please keep your appointments with your specialists as you may have planned

## 2019-07-24 ENCOUNTER — Ambulatory Visit (INDEPENDENT_AMBULATORY_CARE_PROVIDER_SITE_OTHER): Payer: BC Managed Care – PPO | Admitting: Podiatry

## 2019-07-24 ENCOUNTER — Encounter: Payer: Self-pay | Admitting: Podiatry

## 2019-07-24 VITALS — Temp 97.5°F

## 2019-07-24 DIAGNOSIS — M79675 Pain in left toe(s): Secondary | ICD-10-CM | POA: Diagnosis not present

## 2019-07-24 DIAGNOSIS — M79674 Pain in right toe(s): Secondary | ICD-10-CM

## 2019-07-24 DIAGNOSIS — M19079 Primary osteoarthritis, unspecified ankle and foot: Secondary | ICD-10-CM

## 2019-07-24 DIAGNOSIS — M7751 Other enthesopathy of right foot: Secondary | ICD-10-CM | POA: Diagnosis not present

## 2019-07-24 DIAGNOSIS — B351 Tinea unguium: Secondary | ICD-10-CM | POA: Diagnosis not present

## 2019-07-24 NOTE — Progress Notes (Signed)
Subjective: 63 year old male presents the office today for follow-up evaluation of bilateral foot pain and is also requesting a nail trim today.  He states his left foot feels great he feels it is healed.  He does get discomfort on the right side.  The [place] brace was helpful for speaking pain to the ankle joint he points to.  He has not returned to work because of cardiac issues. He tries to trim his nails but will cut the skin.  Denies any systemic complaints such as fevers, chills, nausea, vomiting. No acute changes since last appointment, and no other complaints at this time.   Objective: AAO x3, NAD DP/PT pulses palpable bilaterally, CRT less than 3 seconds Incisions of the left foot are all well-healed.  Not able to elicit any tenderness in the left foot or ankle.  On the right side there is tenderness on the anterior lateral ankle joint.  Significant flatfoot deformities present.  Decreased range of motion of subtalar joint.  No significant discomfort in the foot range of motion.   Nails are hypertrophic, dystrophic, brittle, discolored, elongated 10. No surrounding redness or drainage. Tenderness nails 1-5 bilaterally. No open lesions or pre-ulcerative lesions are identified today. No pain with calf compression, swelling, warmth, erythema  Assessment: Right ankle capsulitis with dorsal deformity/arthritis; healed left surgical foot; symptomatic onychomycosis  Plan: -All treatment options discussed with the patient including all alternatives, risks, complications.  -Steroid injection for the right ankle today.  See procedure note below.  Continue [place] brace.  Consider surgical intervention in the future but for his other issues for now we will hold off. -He feels that the surgeon the left foot is been successful and is having no issues.  I do not continue the [place] brace. -Nails debrided x10 without completion debriding -Patient encouraged to call the office with any questions,  concerns, change in symptoms.   Return in about 9 weeks (around 09/25/2019).Diabetic foot exam and right ankle/foot pain  Trula Slade DPM

## 2019-07-28 ENCOUNTER — Telehealth: Payer: Self-pay | Admitting: Cardiovascular Disease

## 2019-07-28 DIAGNOSIS — G5623 Lesion of ulnar nerve, bilateral upper limbs: Secondary | ICD-10-CM | POA: Diagnosis not present

## 2019-07-28 DIAGNOSIS — G5602 Carpal tunnel syndrome, left upper limb: Secondary | ICD-10-CM | POA: Diagnosis not present

## 2019-07-28 DIAGNOSIS — M5412 Radiculopathy, cervical region: Secondary | ICD-10-CM | POA: Diagnosis not present

## 2019-07-28 NOTE — Telephone Encounter (Signed)
    [facility] Pre-operative Risk Assessment    Request for surgical clearance:  1. What type of surgery is being performed? ACDF C5-C6, C6-C7, left ulnar nerve release/carpal tunnel release   2. When is this surgery scheduled? 08/19/2019   3. What type of clearance is required (medical clearance vs. Pharmacy clearance to hold med vs. Both)? Medical   4. Are there any medications that need to be held prior to surgery and how long? None specified    5. Practice name and name of physician performing surgery?  Dr. Sherley Bounds @ [facility]   6. What is your office phone number - not noted on form   7.   What is your office fax number [phone]  8.   Anesthesia type (None, local, MAC, general) ? General    Leon Nelson M 07/28/2019, 2:46 PM  _________________________________________________________________   (provider comments below)

## 2019-07-28 NOTE — Telephone Encounter (Signed)
Pt in process of near syncope work up and should be wearing a monitor.  He follows up with Dr. Gwenlyn Found on 08/05/19 and at that time Pre-op eval will be done depending on results of monitor.  --Please let the surgeon's office know.  Thanks.

## 2019-07-29 ENCOUNTER — Other Ambulatory Visit: Payer: Self-pay

## 2019-07-31 DIAGNOSIS — G8929 Other chronic pain: Secondary | ICD-10-CM | POA: Diagnosis not present

## 2019-07-31 DIAGNOSIS — M19079 Primary osteoarthritis, unspecified ankle and foot: Secondary | ICD-10-CM | POA: Diagnosis not present

## 2019-07-31 DIAGNOSIS — M25571 Pain in right ankle and joints of right foot: Secondary | ICD-10-CM | POA: Diagnosis not present

## 2019-08-04 ENCOUNTER — Telehealth: Payer: Self-pay

## 2019-08-04 NOTE — Telephone Encounter (Signed)
Signed fmla/disability form in red folder given to medical records Lucilla Lame) on 8/11

## 2019-08-05 ENCOUNTER — Encounter: Payer: Self-pay | Admitting: Cardiovascular Disease

## 2019-08-05 ENCOUNTER — Ambulatory Visit (INDEPENDENT_AMBULATORY_CARE_PROVIDER_SITE_OTHER): Payer: BC Managed Care – PPO | Admitting: Cardiovascular Disease

## 2019-08-05 ENCOUNTER — Other Ambulatory Visit: Payer: Self-pay

## 2019-08-05 DIAGNOSIS — I10 Essential (primary) hypertension: Secondary | ICD-10-CM

## 2019-08-05 DIAGNOSIS — R55 Syncope and collapse: Secondary | ICD-10-CM | POA: Diagnosis not present

## 2019-08-05 DIAGNOSIS — E782 Mixed hyperlipidemia: Secondary | ICD-10-CM | POA: Diagnosis not present

## 2019-08-05 NOTE — Assessment & Plan Note (Signed)
History of hyperlipidemia on statin therapy with lipid profile performed 06/04/2019 revealing total cholesterol 116, LDL 65 and HDL 29.

## 2019-08-05 NOTE — Assessment & Plan Note (Signed)
History of syncope in May when he had 3 episodes.  An event monitor showed no arrhythmia and a 2D echo was entirely normal.  He said no recurrent symptoms.

## 2019-08-05 NOTE — Patient Instructions (Signed)
Medication Instructions:  Your physician recommends that you continue on your current medications as directed. Please refer to the Current Medication list given to you today.  If you need a refill on your cardiac medications before your next appointment, please call your pharmacy.   Lab work: none If you have labs (blood work) drawn today and your tests are completely normal, you will receive your results only by: Marland Kitchen MyChart Message (if you have MyChart) OR . A paper copy in the mail If you have any lab test that is abnormal or we need to change your treatment, we will call you to review the results.  Testing/Procedures: none  Follow-Up: At [facility], you and your health needs are our priority.  As part of our continuing mission to provide you with exceptional heart care, we have created designated Provider Care Teams.  These Care Teams include your primary Cardiologist (physician) and Advanced Practice Providers (APPs -  Physician Assistants and Nurse Practitioners) who all work together to provide you with the care you need, when you need it. . You will need a follow up appointment in 12 months with Dr. Gwenlyn Found.  Please call our office 2 months in advance to schedule this appointment.     Any Other Special Instructions Will Be Listed Below (If Applicable). You have been cleared at low risk, from a cardiac standpoint, for your upcoming procedure.

## 2019-08-05 NOTE — Assessment & Plan Note (Signed)
History of essential hypertension her blood pressure measured today 128/72.  He is on atenolol, chlorthalidone and lisinopril.

## 2019-08-05 NOTE — Progress Notes (Signed)
08/05/2019 RAMELLO CORDIAL   01-May-1956  [id number]  Primary Physician Biagio Borg, MD Primary Cardiologist: Lorretta Harp MD Lupe Carney, [place]  HPI:  Leon Nelson is a 63 y.o.  severely overweight married Caucasian male father of 2 older daughters and 2 daughters that still live at home, grandfather to 5 grandchildren who was referred by Dr. [name] for cardiovascular valuation because of recent onset of presyncope.    I last saw him virtually on 06/11/2019.  His cardiac risk factors are notable for treated hypertension and hyperlipidemia.  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  His mother apparently had hypertrophic cardiomyopathy and underwent septal myectomy at [facility] in 1997.  Mr. Salvetti was apparently genetically tested and did not have the gene for this.  His father died in a motor vehicle accident 1999.  He is never had a heart attack or stroke.  He denies chest pain or shortness of breath.  He did have foot surgery and has been out of work for 6 months, recently went back and is under increased stress.  He began having presyncopal episodes approximately 1 month ago and has had 3 episodes without complete loss of consciousness.   He had a 2D echocardiogram performed 06/12/2019 which was entirely normal except for some diastolic dysfunction and mild LVH.  An event monitor showed no arrhythmias that would have contributed to a syncopal episode.  He Does get some dyspnea but denies chest pain.  He apparently needs to have a neurosurgical procedure performed in the near future by Dr. Ronnald Ramp.  Current Meds  Medication Sig  . allopurinol (ZYLOPRIM) 300 MG tablet TAKE 1 TABLET DAILY  . atenolol-chlorthalidone (TENORETIC) 50-25 MG tablet TAKE ONE-HALF (1/2) TABLET DAILY  . Blood Glucose Monitoring Suppl (ONE TOUCH ULTRA 2) w/Device KIT Use as directed once daily E11.9  . diclofenac sodium (VOLTAREN) 1 % GEL Apply 2 g topically 4 (four)  times daily as needed. (Patient taking differently: Apply 2 g topically 4 (four) times daily as needed (pain). )  . dicyclomine (BENTYL) 20 MG tablet TAKE 1 TABLET BY MOUTH THREE TIMES DAILY AS NEEDED (Patient taking differently: Take 20 mg by mouth daily. )  . gabapentin (NEURONTIN) 300 MG capsule Take 1 capsule (300 mg total) by mouth at bedtime.  Marland Kitchen glipiZIDE (GLUCOTROL XL) 10 MG 24 hr tablet TAKE 1 TABLET DAILY WITH BREAKFAST (Patient taking differently: Take 10 mg by mouth daily. )  . glucose blood (ONE TOUCH ULTRA TEST) test strip Use as instructed once daily E11.9  . KLOR-CON M20 20 MEQ tablet TAKE 2 TABLETS DAILY (Patient taking differently: Take 30 mEq by mouth daily. )  . Lancets MISC Use as directed once daily E11.9  . levothyroxine (SYNTHROID) 50 MCG tablet TAKE 1 TABLET DAILY  . lisinopril (ZESTRIL) 20 MG tablet TAKE 1 TABLET DAILY  . metFORMIN (GLUCOPHAGE-XR) 500 MG 24 hr tablet TAKE 3 TABLETS IN THE MORNING  . simvastatin (ZOCOR) 80 MG tablet TAKE 1 TABLET AT BEDTIME  . venlafaxine XR (EFFEXOR-XR) 75 MG 24 hr capsule TAKE 1 CAPSULE DAILY  . Vitamin D, Ergocalciferol, (DRISDOL) 1.25 MG (50000 UT) CAPS capsule TAKE 1 CAPSULE BY MOUTH EVERY 7 DAYS     Allergies  Allergen Reactions  . Penicillins Other (See Comments)    Blisters on hands  . Sulfonamide Derivatives Other (See Comments)    Blisters in groin area    Social History  Socioeconomic History  . Marital status: Married    Spouse name: Not on file  . Number of children: Not on file  . Years of education: Not on file  . Highest education level: Not on file  Occupational History  . Not on file  Social Needs  . Financial resource strain: Not on file  . Food insecurity    Worry: Not on file    Inability: Not on file  . Transportation needs    Medical: Not on file    Non-medical: Not on file  Tobacco Use  . Smoking status: Never Smoker  . Smokeless tobacco: Never Used  Substance and Sexual Activity  . Alcohol  use: No  . Drug use: No  . Sexual activity: Not on file  Lifestyle  . Physical activity    Days per week: Not on file    Minutes per session: Not on file  . Stress: Not on file  Relationships  . Social Herbalist on phone: Not on file    Gets together: Not on file    Attends religious service: Not on file    Active member of club or organization: Not on file    Attends meetings of clubs or organizations: Not on file    Relationship status: Not on file  . Intimate partner violence    Fear of current or ex partner: Not on file    Emotionally abused: Not on file    Physically abused: Not on file    Forced sexual activity: Not on file  Other Topics Concern  . Not on file  Social History Narrative  . Not on file     Review of Systems: General: negative for chills, fever, night sweats or weight changes.  Cardiovascular: negative for chest pain, dyspnea on exertion, edema, orthopnea, palpitations, paroxysmal nocturnal dyspnea or shortness of breath Dermatological: negative for rash Respiratory: negative for cough or wheezing Urologic: negative for hematuria Abdominal: negative for nausea, vomiting, diarrhea, bright red blood per rectum, melena, or hematemesis Neurologic: negative for visual changes, syncope, or dizziness All other systems reviewed and are otherwise negative except as noted above.    Blood pressure 128/72, pulse 71, temperature (!) 97.3 F (36.3 C), temperature source Temporal, height 6' (1.829 m), weight (!) 315 lb (142.9 kg).  General appearance: alert and no distress Neck: no adenopathy, no carotid bruit, no JVD, supple, symmetrical, trachea midline and thyroid not enlarged, symmetric, no tenderness/mass/nodules Lungs: clear to auscultation bilaterally Heart: regular rate and rhythm, S1, S2 normal, no murmur, click, rub or gallop Extremities: extremities normal, atraumatic, no cyanosis or edema Pulses: 2+ and symmetric Skin: Skin color, texture,  turgor normal. No rashes or lesions Neurologic: Alert and oriented X 3, normal strength and tone. Normal symmetric reflexes. Normal coordination and gait  EKG not performed today  ASSESSMENT AND PLAN:   Hyperlipidemia History of hyperlipidemia on statin therapy with lipid profile performed 06/04/2019 revealing total cholesterol 116, LDL 65 and HDL 29.  Essential hypertension History of essential hypertension her blood pressure measured today 128/72.  He is on atenolol, chlorthalidone and lisinopril.  Syncope History of syncope in May when he had 3 episodes.  An event monitor showed no arrhythmia and a 2D echo was entirely normal.  He said no recurrent symptoms.      Lorretta Harp MD FACP,FACC,FAHA, [facility] 08/05/2019 11:13 AM

## 2019-08-12 ENCOUNTER — Other Ambulatory Visit: Payer: Self-pay | Admitting: Neurological Surgery

## 2019-08-13 DIAGNOSIS — Z1159 Encounter for screening for other viral diseases: Secondary | ICD-10-CM | POA: Diagnosis not present

## 2019-08-19 DIAGNOSIS — G5602 Carpal tunnel syndrome, left upper limb: Secondary | ICD-10-CM | POA: Diagnosis not present

## 2019-08-19 DIAGNOSIS — M5412 Radiculopathy, cervical region: Secondary | ICD-10-CM | POA: Diagnosis not present

## 2019-08-19 DIAGNOSIS — G5622 Lesion of ulnar nerve, left upper limb: Secondary | ICD-10-CM | POA: Diagnosis not present

## 2019-08-19 DIAGNOSIS — M4802 Spinal stenosis, cervical region: Secondary | ICD-10-CM | POA: Diagnosis not present

## 2019-08-19 DIAGNOSIS — M2578 Osteophyte, vertebrae: Secondary | ICD-10-CM | POA: Diagnosis not present

## 2019-08-31 DIAGNOSIS — G8929 Other chronic pain: Secondary | ICD-10-CM | POA: Diagnosis not present

## 2019-08-31 DIAGNOSIS — M25571 Pain in right ankle and joints of right foot: Secondary | ICD-10-CM | POA: Diagnosis not present

## 2019-08-31 DIAGNOSIS — M19079 Primary osteoarthritis, unspecified ankle and foot: Secondary | ICD-10-CM | POA: Diagnosis not present

## 2019-09-07 ENCOUNTER — Other Ambulatory Visit: Payer: Self-pay | Admitting: Internal Medicine

## 2019-09-24 ENCOUNTER — Other Ambulatory Visit: Payer: Self-pay

## 2019-09-24 ENCOUNTER — Ambulatory Visit (INDEPENDENT_AMBULATORY_CARE_PROVIDER_SITE_OTHER): Payer: BC Managed Care – PPO | Admitting: Podiatry

## 2019-09-24 DIAGNOSIS — M2142 Flat foot [pes planus] (acquired), left foot: Secondary | ICD-10-CM | POA: Diagnosis not present

## 2019-09-24 DIAGNOSIS — M19079 Primary osteoarthritis, unspecified ankle and foot: Secondary | ICD-10-CM

## 2019-09-24 DIAGNOSIS — M7751 Other enthesopathy of right foot: Secondary | ICD-10-CM

## 2019-09-24 DIAGNOSIS — M2141 Flat foot [pes planus] (acquired), right foot: Secondary | ICD-10-CM

## 2019-09-24 DIAGNOSIS — M79674 Pain in right toe(s): Secondary | ICD-10-CM | POA: Diagnosis not present

## 2019-09-24 DIAGNOSIS — M79675 Pain in left toe(s): Secondary | ICD-10-CM | POA: Diagnosis not present

## 2019-09-24 DIAGNOSIS — E1149 Type 2 diabetes mellitus with other diabetic neurological complication: Secondary | ICD-10-CM

## 2019-09-24 DIAGNOSIS — B351 Tinea unguium: Secondary | ICD-10-CM

## 2019-09-28 NOTE — Progress Notes (Signed)
Subjective: 63 year old male presents the office today for diabetic foot evaluation.  He states that his left foot is been doing great.  Still with some discomfort in the right side but is been wearing the [place] brace.  He does relate that he is likely going to retire from his job.  He states that he is likely to retire now because of his feet but the actual job as well as other medical conditions.  He is likely going to be working from home.   Denies any open sores.  Nails are elongated and painful.  Denies any systemic complaints such as fevers, chills, nausea, vomiting. No acute changes since last appointment, and no other complaints at this time.   Objective: AAO x3, NAD DP/PT pulses palpable bilaterally, CRT less than 3 seconds Surgical sites in the left foot are well-healed.  There is no pain with it.  On the right foot there is tenderness on the sinus tarsi although minimal.   Nails are hypertrophic, dystrophic, brittle, discolored, elongated 10. No surrounding redness or drainage. Tenderness nails 1-5 bilaterally. No open lesions or pre-ulcerative lesions are identified today.No pain with calf compression, swelling, warmth, erythema  Assessment: 63 year old male with symptomatic onychomycosis,  Plan: -All treatment options discussed with the patient including all alternatives, risks, complications.  -Surgical site the left foot is doing great.  Continue [place] brace on the right side.  Discussed that we can do a steroid injection if needed in the future but for now we will hold off as he is having minimal discomfort. -Nails debrided x10 without any complications or bleeding -Discussed the importance of daily foot inspection. -Patient encouraged to call the office with any questions, concerns, change in symptoms.   Return in about 3 months (around 12/25/2019).  Trula Slade DPM

## 2019-09-29 DIAGNOSIS — M5412 Radiculopathy, cervical region: Secondary | ICD-10-CM | POA: Diagnosis not present

## 2019-09-30 DIAGNOSIS — M25571 Pain in right ankle and joints of right foot: Secondary | ICD-10-CM | POA: Diagnosis not present

## 2019-09-30 DIAGNOSIS — M19079 Primary osteoarthritis, unspecified ankle and foot: Secondary | ICD-10-CM | POA: Diagnosis not present

## 2019-09-30 DIAGNOSIS — G8929 Other chronic pain: Secondary | ICD-10-CM | POA: Diagnosis not present

## 2019-10-08 ENCOUNTER — Telehealth: Payer: Self-pay

## 2019-10-08 NOTE — Telephone Encounter (Signed)
Labs will be done same day due to the pandemic.   Copied from [place] [id number]. Topic: Appointment Scheduling - Scheduling Inquiry for Clinic >> Oct 08, 2019  1:32 PM Lennox Solders wrote: Reason for CRM: pt has a cpe schedule with dr Jenny Reichmann on 11-06-2019 and would like to have cpe labs in advance

## 2019-10-11 DIAGNOSIS — R05 Cough: Secondary | ICD-10-CM | POA: Diagnosis not present

## 2019-10-25 ENCOUNTER — Other Ambulatory Visit: Payer: Self-pay | Admitting: Internal Medicine

## 2019-11-02 ENCOUNTER — Other Ambulatory Visit (INDEPENDENT_AMBULATORY_CARE_PROVIDER_SITE_OTHER): Payer: BC Managed Care – PPO

## 2019-11-02 ENCOUNTER — Telehealth: Payer: Self-pay

## 2019-11-02 ENCOUNTER — Telehealth: Payer: Self-pay | Admitting: Internal Medicine

## 2019-11-02 DIAGNOSIS — Z125 Encounter for screening for malignant neoplasm of prostate: Secondary | ICD-10-CM | POA: Diagnosis not present

## 2019-11-02 DIAGNOSIS — Z0000 Encounter for general adult medical examination without abnormal findings: Secondary | ICD-10-CM

## 2019-11-02 DIAGNOSIS — E1149 Type 2 diabetes mellitus with other diabetic neurological complication: Secondary | ICD-10-CM

## 2019-11-02 LAB — CBC WITH DIFFERENTIAL/PLATELET
Basophils Absolute: 0.1 10*3/uL (ref 0.0–0.1)
Basophils Relative: 1.2 % (ref 0.0–3.0)
Eosinophils Absolute: 0.5 10*3/uL (ref 0.0–0.7)
Eosinophils Relative: 5.7 % — ABNORMAL HIGH (ref 0.0–5.0)
HCT: 42.9 % (ref 39.0–52.0)
Hemoglobin: 14.5 g/dL (ref 13.0–17.0)
Lymphocytes Relative: 29.5 % (ref 12.0–46.0)
Lymphs Abs: 2.6 10*3/uL (ref 0.7–4.0)
MCHC: 33.7 g/dL (ref 30.0–36.0)
MCV: 88.1 fl (ref 78.0–100.0)
Monocytes Absolute: 0.8 10*3/uL (ref 0.1–1.0)
Monocytes Relative: 9 % (ref 3.0–12.0)
Neutro Abs: 4.9 10*3/uL (ref 1.4–7.7)
Neutrophils Relative %: 54.6 % (ref 43.0–77.0)
Platelets: 227 10*3/uL (ref 150.0–400.0)
RBC: 4.87 Mil/uL (ref 4.22–5.81)
RDW: 14 % (ref 11.5–15.5)
WBC: 9 10*3/uL (ref 4.0–10.5)

## 2019-11-02 LAB — URINALYSIS, ROUTINE W REFLEX MICROSCOPIC
Bilirubin Urine: NEGATIVE
Hgb urine dipstick: NEGATIVE
Ketones, ur: NEGATIVE
Leukocytes,Ua: NEGATIVE
Nitrite: NEGATIVE
RBC / HPF: NONE SEEN (ref 0–?)
Specific Gravity, Urine: >=1.03 — AB (ref 1.000–1.030)
Total Protein, Urine: NEGATIVE
Urine Glucose: 100 — AB
Urobilinogen, UA: 0.2 (ref 0.0–1.0)
pH: 6 (ref 5.0–8.0)

## 2019-11-02 LAB — LIPID PANEL
Cholesterol: 139 mg/dL (ref 0–200)
HDL: 34.6 mg/dL — ABNORMAL LOW (ref >39.00)
LDL Cholesterol: 78 mg/dL (ref 0–99)
NonHDL: 104.21
Total CHOL/HDL Ratio: 4
Triglycerides: 132 mg/dL (ref 0.0–149.0)
VLDL: 26.4 mg/dL (ref 0.0–40.0)

## 2019-11-02 LAB — BASIC METABOLIC PANEL
BUN: 20 mg/dL (ref 6–23)
CO2: 27 mEq/L (ref 19–32)
Calcium: 9.3 mg/dL (ref 8.4–10.5)
Chloride: 103 mEq/L (ref 96–112)
Creatinine, Ser: 0.66 mg/dL (ref 0.40–1.50)
GFR: 121.17 mL/min (ref >60.00)
Glucose, Bld: 223 mg/dL — ABNORMAL HIGH (ref 70–99)
Potassium: 4.2 mEq/L (ref 3.5–5.1)
Sodium: 138 mEq/L (ref 135–145)

## 2019-11-02 LAB — PSA: PSA: 0.09 ng/mL — ABNORMAL LOW (ref 0.10–4.00)

## 2019-11-02 LAB — HEPATIC FUNCTION PANEL
ALT: 38 U/L (ref 0–53)
AST: 32 U/L (ref 0–37)
Albumin: 4.2 g/dL (ref 3.5–5.2)
Alkaline Phosphatase: 55 U/L (ref 39–117)
Bilirubin, Direct: 0.1 mg/dL (ref 0.0–0.3)
Total Bilirubin: 0.3 mg/dL (ref 0.2–1.2)
Total Protein: 6.8 g/dL (ref 6.0–8.3)

## 2019-11-02 LAB — TSH: TSH: 3.66 u[IU]/mL (ref 0.35–4.50)

## 2019-11-02 LAB — HEMOGLOBIN A1C: Hgb A1c MFr Bld: 8.2 % — ABNORMAL HIGH (ref 4.6–6.5)

## 2019-11-02 NOTE — Telephone Encounter (Signed)
Orders for upcoming appt put in

## 2019-11-02 NOTE — Telephone Encounter (Signed)
Copied from [place] [id number]. Topic: General - Inquiry >> Nov 02, 2019 10:27 AM Berneta Levins wrote: Reason for CRM:   Pt has a jury summons for tomorrow.  Pt states that he needs a note from the PCP to be out of jury duty.  Pt states that he needs to be out of this because of his physical condition and his wife's status as a cancer survivor.  Pt states that his mother in law also lives with them and this will put her at risk as well. Pt can be reached at [phone]

## 2019-11-02 NOTE — Telephone Encounter (Signed)
Letter has been mailed.

## 2019-11-02 NOTE — Telephone Encounter (Signed)
Pt called back and would like this letter mailed to his home address

## 2019-11-02 NOTE — Telephone Encounter (Signed)
Done erx 

## 2019-11-06 ENCOUNTER — Other Ambulatory Visit: Payer: Self-pay

## 2019-11-06 ENCOUNTER — Ambulatory Visit (INDEPENDENT_AMBULATORY_CARE_PROVIDER_SITE_OTHER): Payer: BC Managed Care – PPO | Admitting: Internal Medicine

## 2019-11-06 ENCOUNTER — Encounter: Payer: Self-pay | Admitting: Internal Medicine

## 2019-11-06 VITALS — BP 134/86 | HR 71 | Temp 98.3°F | Ht 72.0 in | Wt 321.0 lb

## 2019-11-06 DIAGNOSIS — E559 Vitamin D deficiency, unspecified: Secondary | ICD-10-CM | POA: Diagnosis not present

## 2019-11-06 DIAGNOSIS — K769 Liver disease, unspecified: Secondary | ICD-10-CM

## 2019-11-06 DIAGNOSIS — Z23 Encounter for immunization: Secondary | ICD-10-CM

## 2019-11-06 DIAGNOSIS — J309 Allergic rhinitis, unspecified: Secondary | ICD-10-CM

## 2019-11-06 DIAGNOSIS — E538 Deficiency of other specified B group vitamins: Secondary | ICD-10-CM

## 2019-11-06 DIAGNOSIS — E611 Iron deficiency: Secondary | ICD-10-CM

## 2019-11-06 DIAGNOSIS — Z0000 Encounter for general adult medical examination without abnormal findings: Secondary | ICD-10-CM

## 2019-11-06 DIAGNOSIS — E1149 Type 2 diabetes mellitus with other diabetic neurological complication: Secondary | ICD-10-CM

## 2019-11-06 MED ORDER — METFORMIN HCL 1000 MG PO TABS: 1000.0000 mg | ORAL_TABLET | Freq: Two times a day (BID) | ORAL | 3 refills | Status: DC

## 2019-11-06 MED ORDER — TRULICITY 0.75 MG/0.5ML ~~LOC~~ SOAJ: 0.7500 mg | SUBCUTANEOUS | 3 refills | Status: DC

## 2019-11-06 NOTE — Patient Instructions (Addendum)
You had the flu shot today and the Tdap tetanus shot today, and the Prevnar 13 pneumonia shot today  We can plan on the last Pneumovax pneumonia shot next yr after 63yo.  Please take all new medication as prescribed - the trulicity once weekly for sugar and wt loss  OK to change the metformin to 1000 mg twice per day  Please also take OTC Allegra and Nasacort for allergies  Please continue all other medications as before, and refills have been done if requested.  Please have the pharmacy call with any other refills you may need.  Please continue your efforts at being more active, low cholesterol diet, and weight control.  You are otherwise up to date with prevention measures today.  Please keep your appointments with your specialists as you may have planned  Please return in 6 months, or sooner if needed, with Lab testing done 3-5 days before

## 2019-11-06 NOTE — Progress Notes (Signed)
Subjective:    Patient ID: Leon Nelson, male    DOB: 1956/11/13, 63 y.o.   MRN: [id number]  HPI  Here for wellness and f/u;  Overall doing ok;  Pt denies Chest pain, worsening SOB, DOE, wheezing, orthopnea, PND, worsening LE edema, palpitations, dizziness or syncope.  Pt denies neurological change such as new headache, facial or extremity weakness.  Pt denies polydipsia, polyuria, or low sugar symptoms. Pt states overall good compliance with treatment and medications, good tolerability, and has been trying to follow appropriate diet.  Pt denies worsening depressive symptoms, suicidal ideation or panic. No fever, night sweats, wt loss, loss of appetite, or other constitutional symptoms.  Pt states good ability with ADL's, has low fall risk, home safety reviewed and adequate, no other significant changes in hearing or vision, and only occasionally active with exercise. S/p recent surgury x 2 and pandemic + wt gain.  Wt Readings from Last 3 Encounters:  11/06/19 (!) 321 lb (145.6 kg)  08/05/19 (!) 315 lb (142.9 kg)  07/23/19 (!) 313 lb (142 kg)  States overall balance overall getting worse, but no falls and non specific.   Does have several wks ongoing nasal allergy symptoms with clearish congestion, itch and sneezing, without fever, pain, ST, cough, swelling or wheezing.   Past Medical History:  Diagnosis Date  . ABDOMINAL PAIN OTHER SPECIFIED SITE 07/20/2008  . CHEST PAIN 02/06/2011  . COLONIC POLYPS, HX OF 05/26/2008  . DEPRESSION 05/26/2008  . DIABETES MELLITUS, TYPE II 05/26/2008  . HYPERLIPIDEMIA 05/26/2008  . HYPERTENSION 05/26/2008  . HYPOTHYROIDISM 05/26/2008  . KNEE PAIN, RIGHT, ACUTE 07/01/2008  . Personal history of urinary calculi 05/26/2008  . UTI'S, HX OF 05/26/2008   Past Surgical History:  Procedure Laterality Date  . s/p right knee arthroplasty      complicated by patellar tendon rupture Jan 2011 Dr. Gladstone Lighter  . TONSILLECTOMY      reports that he has never smoked. He has never used  smokeless tobacco. He reports that he does not drink alcohol or use drugs. family history includes Alcohol abuse in an other family member; Cancer in his mother; Diabetes in an other family member; Heart disease in his father and mother; Hyperlipidemia in his father and mother. Allergies  Allergen Reactions  . Penicillins Other (See Comments)    Blisters on hands  . Sulfonamide Derivatives Other (See Comments)    Blisters in groin area   Current Outpatient Medications on File Prior to Visit  Medication Sig Dispense Refill  . allopurinol (ZYLOPRIM) 300 MG tablet TAKE 1 TABLET DAILY 90 tablet 3  . atenolol-chlorthalidone (TENORETIC) 50-25 MG tablet TAKE ONE-HALF (1/2) TABLET DAILY 45 tablet 3  . Blood Glucose Monitoring Suppl (ONE TOUCH ULTRA 2) w/Device KIT Use as directed once daily E11.9 1 each 0  . diclofenac sodium (VOLTAREN) 1 % GEL Apply 2 g topically 4 (four) times daily as needed. (Patient taking differently: Apply 2 g topically 4 (four) times daily as needed (pain). ) 200 g 5  . dicyclomine (BENTYL) 20 MG tablet TAKE 1 TABLET BY MOUTH THREE TIMES DAILY AS NEEDED (Patient taking differently: Take 20 mg by mouth daily. ) 90 tablet 3  . gabapentin (NEURONTIN) 300 MG capsule Take 1 capsule (300 mg total) by mouth at bedtime. 90 capsule 3  . glipiZIDE (GLUCOTROL XL) 10 MG 24 hr tablet TAKE 1 TABLET DAILY WITH BREAKFAST 90 tablet 3  . glucose blood (ONE TOUCH ULTRA TEST) test strip Use as instructed  once daily E11.9 100 each 12  . KLOR-CON M20 20 MEQ tablet TAKE 2 TABLETS DAILY (Patient taking differently: Take 30 mEq by mouth daily. ) 180 tablet 3  . Lancets MISC Use as directed once daily E11.9 100 each 3  . levothyroxine (SYNTHROID) 50 MCG tablet TAKE 1 TABLET DAILY 90 tablet 3  . lisinopril (ZESTRIL) 20 MG tablet TAKE 1 TABLET DAILY 90 tablet 3  . simvastatin (ZOCOR) 80 MG tablet TAKE 1 TABLET AT BEDTIME 90 tablet 3  . venlafaxine XR (EFFEXOR-XR) 75 MG 24 hr capsule TAKE 1 CAPSULE DAILY  90 capsule 3  . Vitamin D, Ergocalciferol, (DRISDOL) 1.25 MG (50000 UT) CAPS capsule TAKE 1 CAPSULE BY MOUTH EVERY 7 DAYS 10 capsule 2   No current facility-administered medications on file prior to visit.    Review of Systems Constitutional: Negative for other unusual diaphoresis, sweats, appetite or weight changes HENT: Negative for other worsening hearing loss, ear pain, facial swelling, mouth sores or neck stiffness.   Eyes: Negative for other worsening pain, redness or other visual disturbance.  Respiratory: Negative for other stridor or swelling Cardiovascular: Negative for other palpitations or other chest pain  Gastrointestinal: Negative for worsening diarrhea or loose stools, blood in stool, distention or other pain Genitourinary: Negative for hematuria, flank pain or other change in urine volume.  Musculoskeletal: Negative for myalgias or other joint swelling.  Skin: Negative for other color change, or other wound or worsening drainage.  Neurological: Negative for other syncope or numbness. Hematological: Negative for other adenopathy or swelling Psychiatric/Behavioral: Negative for hallucinations, other worsening agitation, SI, self-injury, or new decreased concentration All otherwise neg per pt    Objective:   Physical Exam BP 134/86   Pulse 71   Temp 98.3 F (36.8 C) (Oral)   Ht 6' (1.829 m)   Wt (!) 321 lb (145.6 kg)   SpO2 95%   BMI 43.54 kg/m  VS noted,  Constitutional: Pt is oriented to person, place, and time. Appears well-developed and well-nourished, in no significant distress and comfortable Head: Normocephalic and atraumatic  Eyes: Conjunctivae and EOM are normal. Pupils are equal, round, and reactive to light Bilat tm's with mild erythema.  Max sinus areas non tender.  Pharynx with mild erythema, no exudate Right Ear: External ear normal without discharge Left Ear: External ear normal without discharge Nose: Nose without discharge or deformity  Mouth/Throat: Oropharynx is without other ulcerations and moist  Neck: Normal range of motion. Neck supple. No JVD present. No tracheal deviation present or significant neck LA or mass Cardiovascular: Normal rate, regular rhythm, normal heart sounds and intact distal pulses.   Pulmonary/Chest: WOB normal and breath sounds without rales or wheezing  Abdominal: Soft. Bowel sounds are normal. NT. No HSM  Musculoskeletal: Normal range of motion. Exhibits no edema Lymphadenopathy: Has no other cervical adenopathy.  Neurological: Pt is alert and oriented to person, place, and time. Pt has normal reflexes. No cranial nerve deficit. Motor grossly intact, Gait intact Skin: Skin is warm and dry. No rash noted or new ulcerations Psychiatric:  Has normal mood and affect. Behavior is normal without agitation All otherwise neg per pt Lab Results  Component Value Date   WBC 9.0 11/02/2019   HGB 14.5 11/02/2019   HCT 42.9 11/02/2019   PLT 227.0 11/02/2019   GLUCOSE 223 (H) 11/02/2019   CHOL 139 11/02/2019   TRIG 132.0 11/02/2019   HDL 34.60 (L) 11/02/2019   LDLDIRECT 65.0 06/04/2019   LDLCALC 78 11/02/2019  ALT 38 11/02/2019   AST 32 11/02/2019   NA 138 11/02/2019   K 4.2 11/02/2019   CL 103 11/02/2019   CREATININE 0.66 11/02/2019   BUN 20 11/02/2019   CO2 27 11/02/2019   TSH 3.66 11/02/2019   PSA 0.09 (L) 11/02/2019   INR 2.0 ratio (H) 01/16/2010   HGBA1C 8.2 (H) 11/02/2019   MICROALBUR 1.4 10/27/2018      Assessment & Plan:

## 2019-11-07 ENCOUNTER — Encounter: Payer: Self-pay | Admitting: Internal Medicine

## 2019-11-07 DIAGNOSIS — J309 Allergic rhinitis, unspecified: Secondary | ICD-10-CM | POA: Insufficient documentation

## 2019-11-07 NOTE — Assessment & Plan Note (Signed)
Uncontrolled, for increased metformin to 1000 bid, and add trulicity weekly

## 2019-11-07 NOTE — Assessment & Plan Note (Signed)
Mild to mod, for allegra and nasacort asd,   to f/u any worsening symptoms or concerns 

## 2019-11-07 NOTE — Assessment & Plan Note (Signed)

## 2019-11-09 ENCOUNTER — Telehealth: Payer: Self-pay | Admitting: *Deleted

## 2019-11-09 NOTE — Telephone Encounter (Signed)
Patient wants to know if he should still be taking his glipiZIDE (GLUCOTROL XL) 10 MG 24 hr tablet. Please advise

## 2019-11-09 NOTE — Telephone Encounter (Signed)
Notified pt w/MD response.../lmb 

## 2019-11-09 NOTE — Telephone Encounter (Signed)
Yes that is true

## 2019-12-18 IMAGING — MR MRI ABDOMEN WITH AND WITHOUT CONTRAST
11 of 18 series · 27 of 48 positions shown · IV contrast (20ml Multihance)
Comparison: CT the abdomen and pelvis 06/09/2019.

CLINICAL DATA: 62-year-old male with history of adrenal lesions
noted on recent CT examination. Right lower quadrant abdominal pain.

EXAM:
MRI ABDOMEN WITHOUT AND WITH CONTRAST
TECHNIQUE: Multiplanar multisequence MR imaging of the abdomen was performed
both before and after the administration of intravenous contrast.
CONTRAST:  20mL MULTIHANCE GADOBENATE DIMEGLUMINE 529 MG/ML IV SOLN

[Series 3: T2 · coronal · 5.0mm · 1.72mm/px · 2 of 42 slices shown (1 of 3)]
[im 1/42]
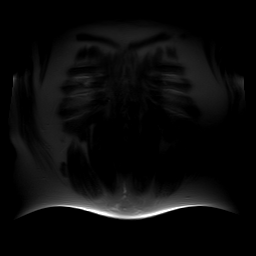
[im 42/42]
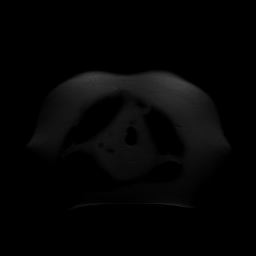

[Series 4: T2 · axial · 5.0mm · 1.72mm/px · z∈[-176,+143]mm · 2 of 52 slices shown (2 of 3)]
[im 1/52]
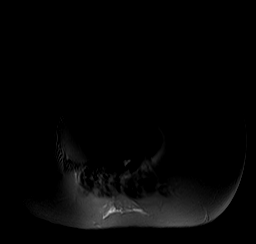
[im 52/52]
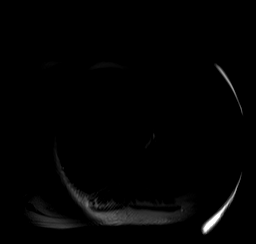

[Series 5: axial in out · axial · 5.5mm · 0.86mm/px · z∈[-142,+130]mm · 3 of 88 slices shown]
[im 1/88]
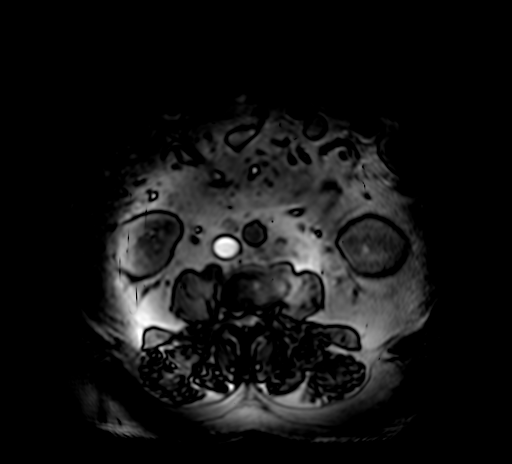
[im 44/88]
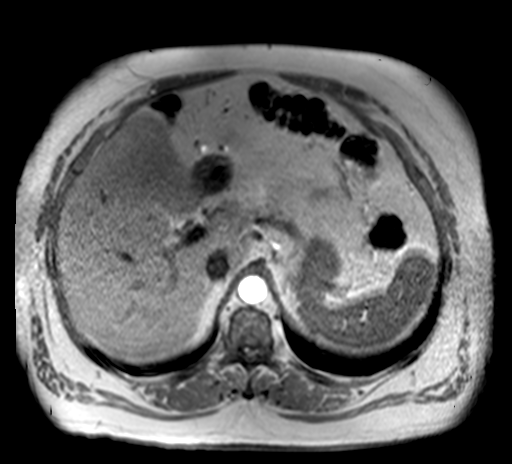
[im 88/88]
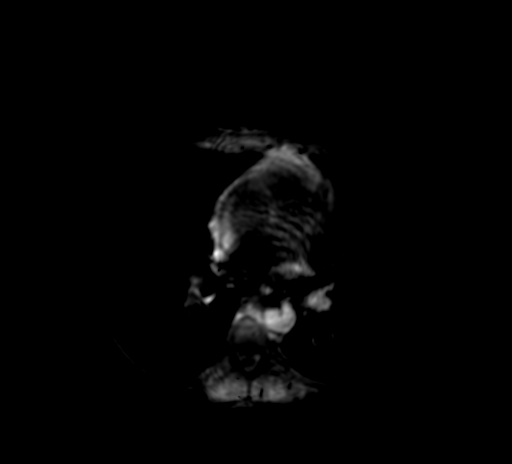

[Series 6: cor in out · coronal · 5.5mm · 0.86mm/px · 3 of 64 slices shown]
[im 1/64]
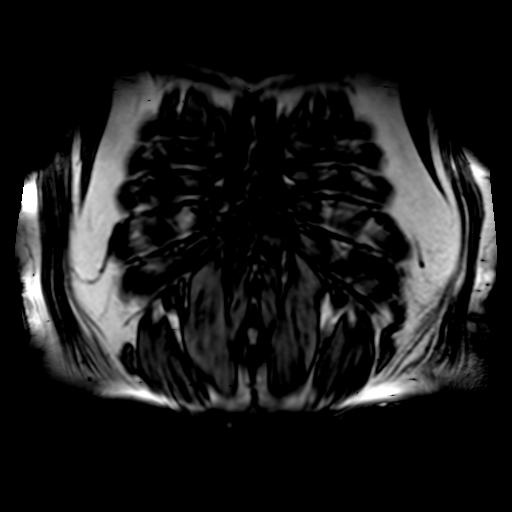
[im 32/64]
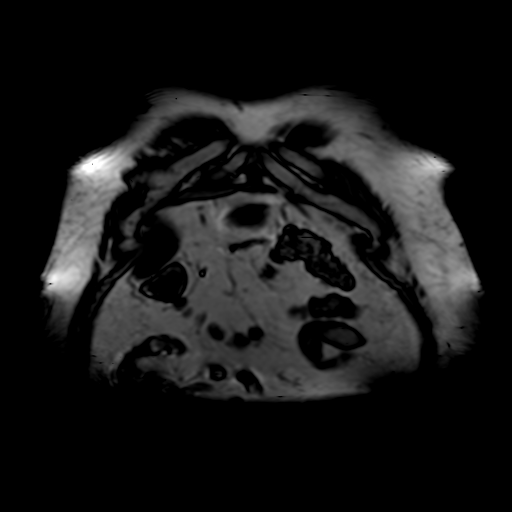
[im 64/64]
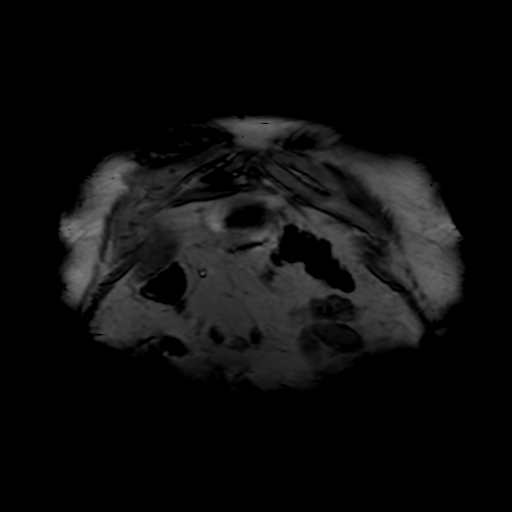

[Series 7: axial tru fisp · axial · 5.0mm · 1.76mm/px · z∈[-186,+153]mm · 2 of 60 slices shown]
[im 1/60]
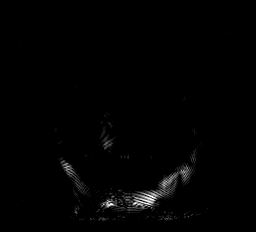
[im 60/60]
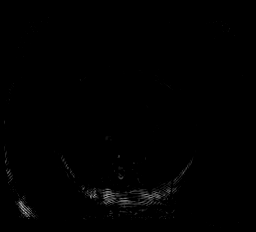

[Series 8: ep2d_[id number]_500_800_p2_trig · axial · 5.0mm · 2.29mm/px · z∈[-123,+177]mm · 4 of 147 slices shown]
[im 1/147]
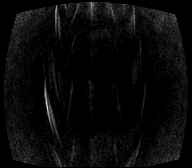
[im 49/147]
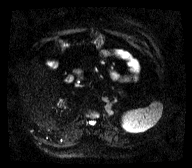
[im 98/147]
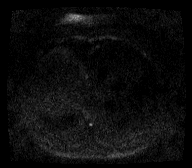
[im 147/147]
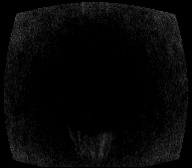

[Series 9: ep2d_[id number]_800_p2_trig_adc · axial · 5.0mm · 2.29mm/px · 1 of 48 slices shown]
[im 1/48]
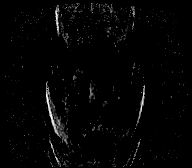

[Series 10: T2 · axial · 5.0mm · 0.82mm/px · 1 of 50 slices shown (3 of 3)]
[im 1/50]
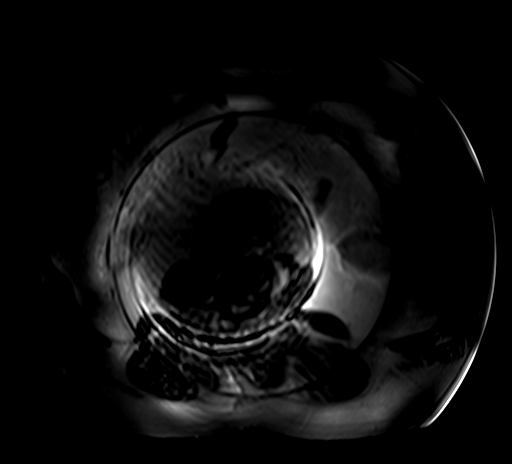

[Series 11: T1 dynamic · axial · non-contrast · 2.5mm · 0.86mm/px · z∈[-159,+139]mm · 3 of 120 slices shown]
[im 1/120]
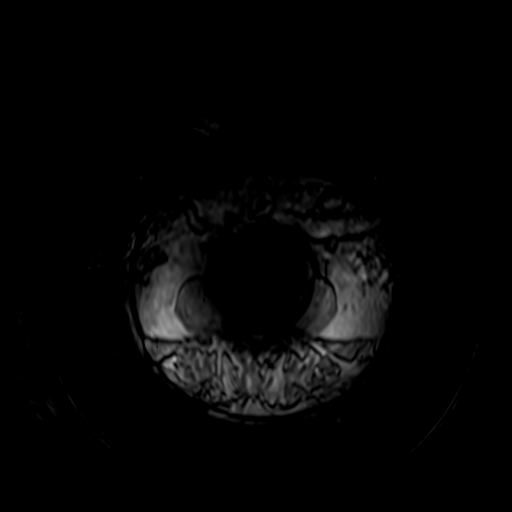
[im 60/120]
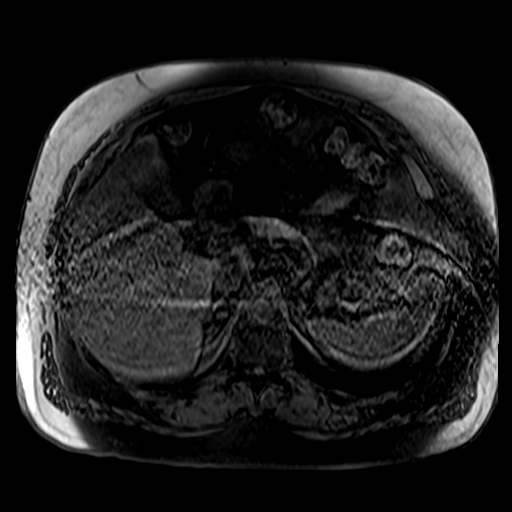
[im 120/120]
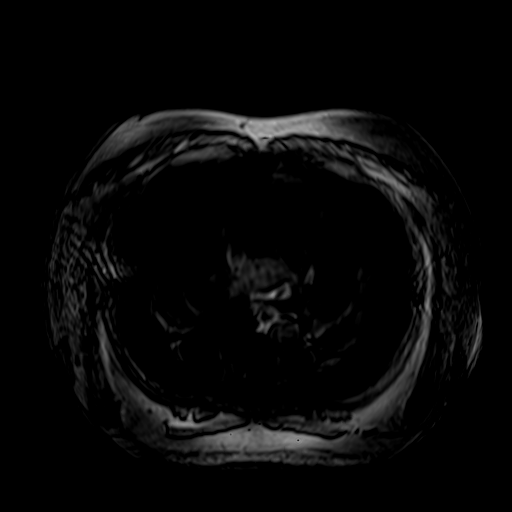

[Series 12: post 25 sec · axial · 2.5mm · 0.86mm/px · z∈[-159,+139]mm · 3 of 120 slices shown]
[im 1/120]
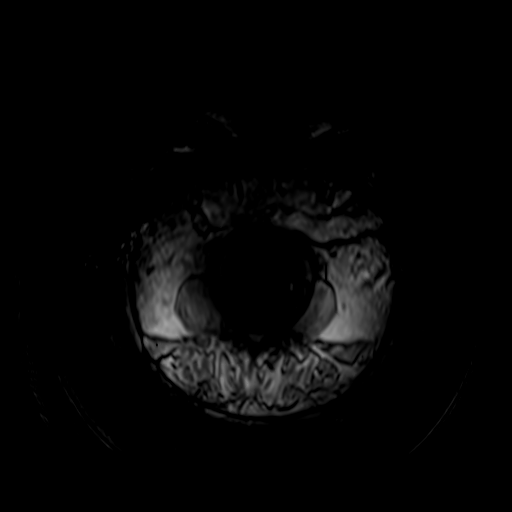
[im 60/120]
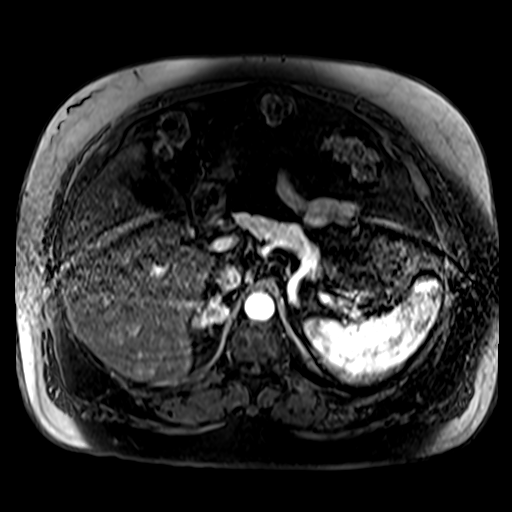
[im 120/120]
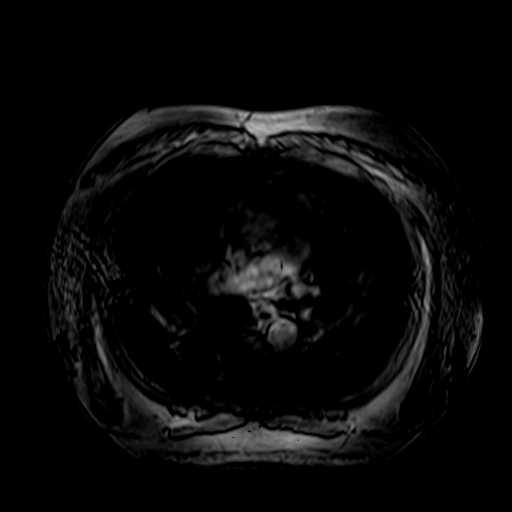

[Series 13: post 25 sec_sub · axial · 2.5mm · 0.86mm/px · z∈[-159,+139]mm · 3 of 120 slices shown]
[im 1/120]
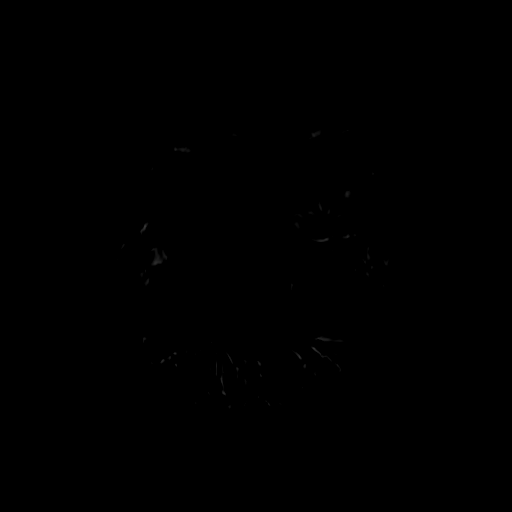
[im 60/120]
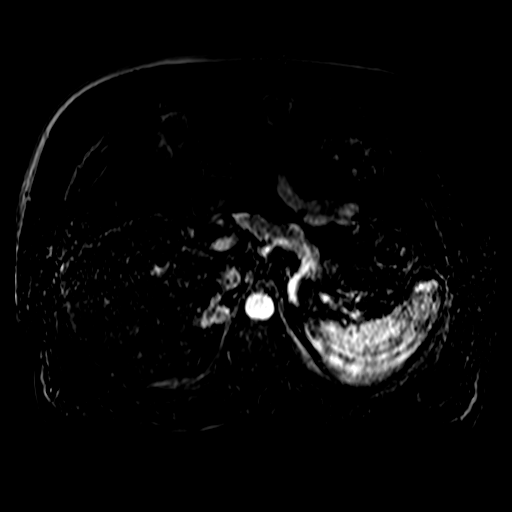
[im 120/120]
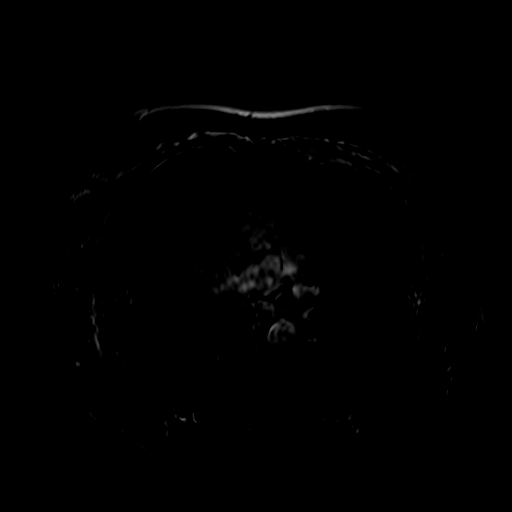

[27 of 48 positions shown; findings below may reference images not displayed]

FINDINGS: Lower chest: Unremarkable.

Hepatobiliary: Diffuse loss of signal intensity throughout the
hepatic parenchyma on out of phase dual echo images, indicative of a
background of severe hepatic steatosis. Several hepatic lesions are
noted, demonstrating T1 hypointensity, T2 hyperintensity, peripheral
nodular hyperenhancement with progressive centripetal filling,
diagnostic of cavernous hemangiomas. The largest of these is at the
junction of segment 4A and 8 (axial image 14 of series 10) measuring
2.7 by 1.9 cm. In addition, there is atrophy of segments 2 and 3 of
the liver, which appear replaced by a large lesion measuring
approximately 8.4 x 4.4 cm (axial image 41 of series 14). This
lesion is heterogeneous in signal intensity on T1 and T2 weighted
images, and demonstrates heterogeneous enhancement on post
gadolinium imaging, favored to represent an atypical hemangioma. No
intra or extrahepatic biliary ductal dilatation. Gallbladder is
normal in appearance.

Pancreas: No pancreatic mass. No pancreatic ductal dilatation. No
pancreatic or peripancreatic fluid collections or inflammatory
changes.

Spleen:  Unremarkable.

Adrenals/Urinary Tract: Bilateral kidneys are normal in appearance.
No hydroureteronephrosis in the visualized portions of the abdomen.
Multiple small adrenal nodules are noted bilaterally, largest of
which is on the right side measuring 1.6 cm in diameter (axial image
26 of series 5). These all demonstrate loss of signal intensity on
out of phase dual echo images, diagnostic of adenomas.

Stomach/Bowel: Visualized portions are unremarkable.

Vascular/Lymphatic: No aneurysm identified in the visualized
abdominal vasculature. No lymphadenopathy noted in the visualized
abdomen.

Other: No significant volume of ascites in the visualized portions
of the peritoneal cavity.

Musculoskeletal: No aggressive appearing osseous lesions are noted
in the visualized portions of the skeleton.
IMPRESSION: 1. Small bilateral adrenal adenomas.
2. Numerous hepatic lesions, as above. The majority of these are
compatible with benign cavernous hemangiomas. The largest lesion
which occupies much of segments 2 and 3 of the liver which are
severely atrophic has atypical imaging characteristics. This is
favored to represent an atypical hemangioma, however, close
attention on short-term follow-up study with repeat abdominal MRI
with and without IV gadolinium is recommended in 3 months to ensure
the stability of this finding.
3. Hepatic steatosis.

## 2019-12-22 ENCOUNTER — Other Ambulatory Visit: Payer: Self-pay

## 2019-12-22 ENCOUNTER — Ambulatory Visit: Payer: Medicare Other | Admitting: Podiatry

## 2019-12-22 DIAGNOSIS — M79675 Pain in left toe(s): Secondary | ICD-10-CM

## 2019-12-22 DIAGNOSIS — E1149 Type 2 diabetes mellitus with other diabetic neurological complication: Secondary | ICD-10-CM

## 2019-12-22 DIAGNOSIS — M79674 Pain in right toe(s): Secondary | ICD-10-CM

## 2019-12-22 DIAGNOSIS — B351 Tinea unguium: Secondary | ICD-10-CM | POA: Diagnosis not present

## 2019-12-22 DIAGNOSIS — Q828 Other specified congenital malformations of skin: Secondary | ICD-10-CM | POA: Diagnosis not present

## 2019-12-22 NOTE — Progress Notes (Signed)
Subjective: 63 year old male presents the office today for diabetic foot evaluation and for calluses to both of his great toes as well as the toenails he cannot trim himself.  Denies any ulcerations.  He has been some worsening numbness and tingling to his feet.  Takes finger milligrams of gabapentin at nighttime but any side effects.  Denies any systemic complaints such as fevers, chills, nausea, vomiting. No acute changes since last appointment, and no other complaints at this time.   Objective: AAO x3, NAD DP/PT pulses palpable bilaterally, CRT less than 3 seconds Nails are hypertrophic, dystrophic, brittle, discolored, elongated 10. No surrounding redness or drainage. Tenderness nails 1-5 bilaterally.  Hyperkeratotic lesions to bilateral hallux.  Upon debridement no underlying ulceration, drainage or any signs of infection.  No other open lesions or pre-ulcerative lesions are identified today.No pain with calf compression, swelling, warmth, erythema  Assessment: 63 year old male with symptomatic onychomycosis, hyperkeratotic lesions; neuropathy  Plan: -All treatment options discussed with the patient including all alternatives, risks, complications.  -Nails debrided x10 without any complications or bleeding -Hyperkeratotic lesion sharply treated x2 without complications -Increase gabapentin to 2 pills at nighttime.  Patient able to tolerate describes taking 300 mg twice a day. -Discussed the importance of daily foot inspection. -Patient encouraged to call the office with any questions, concerns, change in symptoms.   Return in about 3 months (around 12/25/2019).  Trula Slade DPM

## 2020-02-04 ENCOUNTER — Telehealth: Payer: Self-pay | Admitting: Podiatry

## 2020-02-04 ENCOUNTER — Other Ambulatory Visit: Payer: Self-pay | Admitting: Podiatry

## 2020-02-04 MED ORDER — GABAPENTIN 300 MG PO CAPS: 300.0000 mg | ORAL_CAPSULE | Freq: Two times a day (BID) | ORAL | 2 refills | Status: DC

## 2020-02-04 NOTE — Telephone Encounter (Signed)
Pt called requesting refill of Gabapentin 300mg  twice daily. Pt states he has been trying to get it refilled for several weeks now and hasn't had any luck getting it sent to the pharmacy.  Pharmacy is [facility]

## 2020-02-04 NOTE — Telephone Encounter (Signed)
The last note states increase to 2 pills at bedtime, but prescription still reads 1 pill at bedtime. Please review and advise. Thanks

## 2020-02-04 NOTE — Telephone Encounter (Signed)
I have refilled and called the patient.

## 2020-02-09 ENCOUNTER — Other Ambulatory Visit: Payer: Self-pay | Admitting: Internal Medicine

## 2020-02-09 MED ORDER — ALLOPURINOL 300 MG PO TABS: 300.0000 mg | ORAL_TABLET | Freq: Every day | ORAL | 1 refills | Status: DC

## 2020-02-09 MED ORDER — SIMVASTATIN 80 MG PO TABS: 80.0000 mg | ORAL_TABLET | Freq: Every day | ORAL | 1 refills | Status: DC

## 2020-02-09 MED ORDER — VENLAFAXINE HCL ER 75 MG PO CP24: 75.0000 mg | ORAL_CAPSULE | Freq: Every day | ORAL | 1 refills | Status: DC

## 2020-02-09 MED ORDER — DICYCLOMINE HCL 20 MG PO TABS: 20.0000 mg | ORAL_TABLET | Freq: Three times a day (TID) | ORAL | 1 refills | Status: DC | PRN

## 2020-02-09 MED ORDER — LEVOTHYROXINE SODIUM 50 MCG PO TABS: 50.0000 ug | ORAL_TABLET | Freq: Every day | ORAL | 1 refills | Status: DC

## 2020-02-09 MED ORDER — METFORMIN HCL 1000 MG PO TABS: 1000.0000 mg | ORAL_TABLET | Freq: Two times a day (BID) | ORAL | 3 refills | Status: DC

## 2020-02-09 MED ORDER — GLIPIZIDE ER 10 MG PO TB24: 10.0000 mg | ORAL_TABLET | Freq: Every day | ORAL | 1 refills | Status: DC

## 2020-02-09 NOTE — Telephone Encounter (Signed)
Medications sent to pharmacy as requested

## 2020-02-09 NOTE — Telephone Encounter (Signed)
       1. Which medications need to be refilled? (please list name of each medication and dose if known)  dicyclomine (BENTYL) 20 MG tablet metFORMIN (GLUCOPHAGE) 1000 MG tablet allopurinol (ZYLOPRIM) 300 MG tablet glipiZIDE (GLUCOTROL XL) 10 MG 24 hr tablet simvastatin (ZOCOR) 80 MG tablet levothyroxine (SYNTHROID) 50 MCG tablet venlafaxine XR (EFFEXOR-XR) 75 MG 24 hr capsule  2. Which pharmacy/location (including street and city if local pharmacy) is medication to be sent to? [facility], member ID  [id number]. Do they need a 30 day or 90 day supply? [place]

## 2020-02-17 ENCOUNTER — Telehealth: Payer: Self-pay | Admitting: Internal Medicine

## 2020-02-17 NOTE — Telephone Encounter (Addendum)
Prescription was sent to [facility] on 02-09-2020

## 2020-02-17 NOTE — Telephone Encounter (Signed)
   Pt c/o medication issue:  1. Name of Medication: simvastatin (ZOCOR) 80 MG tablet  2. How are you currently taking this medication (dosage and times per day)? As written  3. Are you having a reaction (difficulty breathing--STAT)? no  4. What is your medication issue? [facility] [phone], ref # [id number], calling to clarify dose

## 2020-02-18 ENCOUNTER — Telehealth: Payer: Self-pay

## 2020-02-18 NOTE — Telephone Encounter (Signed)
New message    [facility] needs the nurse to call to clarification on simvastatin (ZOCOR) 80 MG tablet

## 2020-02-19 NOTE — Telephone Encounter (Signed)
Dose recommendation of 5 - 40 mg daily unless therapy has been tolerated for more than 1 year. Reviewed medication list and pt has been on simvastatin 80 mg tablets since 2012.   Pharmacist informed of same.

## 2020-02-22 ENCOUNTER — Other Ambulatory Visit: Payer: Self-pay | Admitting: Internal Medicine

## 2020-03-21 ENCOUNTER — Ambulatory Visit: Payer: Medicare Other | Admitting: Podiatry

## 2020-04-05 ENCOUNTER — Telehealth: Payer: Self-pay | Admitting: Internal Medicine

## 2020-04-05 NOTE — Telephone Encounter (Signed)
    Pt c/o medication issue:  1. Name of Medication: glipiZIDE (GLUCOTROL XL) 10 MG 24 hr tablet  2. How are you currently taking this medication (dosage and times per day)?as written  3. Are you having a reaction (difficulty breathing--STAT)? no  4. What is your medication issue? Patient wants know if the XL and extended release are the same. He received medication and he states his bottle usually says ER, not XL

## 2020-04-05 NOTE — Telephone Encounter (Signed)
Called left voicemail explain difference in medications

## 2020-04-11 ENCOUNTER — Telehealth: Payer: Self-pay

## 2020-04-11 MED ORDER — LISINOPRIL 20 MG PO TABS: 20.0000 mg | ORAL_TABLET | Freq: Every day | ORAL | 2 refills | Status: DC

## 2020-04-11 NOTE — Telephone Encounter (Signed)
Reviewed chart pt is up-to-date sent refills to [facility].../lmb  

## 2020-04-11 NOTE — Telephone Encounter (Signed)
1.Medication Requested:lisinopril (ZESTRIL) 20 MG tablet  2. Pharmacy (Name, Street, [place]):[place], [place]  The patient is asking to send to [facility] on [address]   3. On Med List: Yes   4. Last Visit with PCP: 11.13.20  5. Next visit date with PCP: 5.13.2021    Agent: Please be advised that RX refills may take up to 3 business days. We ask that you follow-up with your pharmacy.

## 2020-04-20 ENCOUNTER — Other Ambulatory Visit: Payer: Self-pay

## 2020-04-20 DIAGNOSIS — I10 Essential (primary) hypertension: Secondary | ICD-10-CM

## 2020-04-20 MED ORDER — ATENOLOL-CHLORTHALIDONE 50-25 MG PO TABS: 0.5000 | ORAL_TABLET | Freq: Every day | ORAL | 3 refills | Status: DC

## 2020-05-05 ENCOUNTER — Ambulatory Visit: Payer: BC Managed Care – PPO | Admitting: Internal Medicine

## 2020-05-11 ENCOUNTER — Encounter: Payer: Self-pay | Admitting: Internal Medicine

## 2020-05-11 ENCOUNTER — Other Ambulatory Visit: Payer: Self-pay

## 2020-05-11 ENCOUNTER — Ambulatory Visit (INDEPENDENT_AMBULATORY_CARE_PROVIDER_SITE_OTHER): Payer: Medicare Other | Admitting: Internal Medicine

## 2020-05-11 VITALS — BP 130/82 | HR 61 | Temp 99.0°F | Ht 72.0 in | Wt 313.0 lb

## 2020-05-11 DIAGNOSIS — E538 Deficiency of other specified B group vitamins: Secondary | ICD-10-CM | POA: Diagnosis not present

## 2020-05-11 DIAGNOSIS — E559 Vitamin D deficiency, unspecified: Secondary | ICD-10-CM | POA: Diagnosis not present

## 2020-05-11 DIAGNOSIS — Z0000 Encounter for general adult medical examination without abnormal findings: Secondary | ICD-10-CM

## 2020-05-11 DIAGNOSIS — E1149 Type 2 diabetes mellitus with other diabetic neurological complication: Secondary | ICD-10-CM | POA: Diagnosis not present

## 2020-05-11 DIAGNOSIS — Z125 Encounter for screening for malignant neoplasm of prostate: Secondary | ICD-10-CM | POA: Diagnosis not present

## 2020-05-11 LAB — LIPID PANEL
Cholesterol: 137 mg/dL (ref 0–200)
HDL: 34.8 mg/dL — ABNORMAL LOW (ref >39.00)
LDL Cholesterol: 63 mg/dL (ref 0–99)
NonHDL: 102.67
Total CHOL/HDL Ratio: 4
Triglycerides: 197 mg/dL — ABNORMAL HIGH (ref 0.0–149.0)
VLDL: 39.4 mg/dL (ref 0.0–40.0)

## 2020-05-11 LAB — CBC WITH DIFFERENTIAL/PLATELET
Basophils Absolute: 0.1 10*3/uL (ref 0.0–0.1)
Basophils Relative: 1 % (ref 0.0–3.0)
Eosinophils Absolute: 0.5 10*3/uL (ref 0.0–0.7)
Eosinophils Relative: 4.8 % (ref 0.0–5.0)
HCT: 44.7 % (ref 39.0–52.0)
Hemoglobin: 14.7 g/dL (ref 13.0–17.0)
Lymphocytes Relative: 27.9 % (ref 12.0–46.0)
Lymphs Abs: 3.2 10*3/uL (ref 0.7–4.0)
MCHC: 32.9 g/dL (ref 30.0–36.0)
MCV: 89.8 fl (ref 78.0–100.0)
Monocytes Absolute: 0.9 10*3/uL (ref 0.1–1.0)
Monocytes Relative: 8.1 % (ref 3.0–12.0)
Neutro Abs: 6.6 10*3/uL (ref 1.4–7.7)
Neutrophils Relative %: 58.2 % (ref 43.0–77.0)
Platelets: 280 10*3/uL (ref 150.0–400.0)
RBC: 4.98 Mil/uL (ref 4.22–5.81)
RDW: 13.8 % (ref 11.5–15.5)
WBC: 11.4 10*3/uL — ABNORMAL HIGH (ref 4.0–10.5)

## 2020-05-11 LAB — BASIC METABOLIC PANEL
BUN: 12 mg/dL (ref 6–23)
CO2: 33 mEq/L — ABNORMAL HIGH (ref 19–32)
Calcium: 9.5 mg/dL (ref 8.4–10.5)
Chloride: 101 mEq/L (ref 96–112)
Creatinine, Ser: 0.81 mg/dL (ref 0.40–1.50)
GFR: 95.51 mL/min (ref >60.00)
Glucose, Bld: 161 mg/dL — ABNORMAL HIGH (ref 70–99)
Potassium: 4 mEq/L (ref 3.5–5.1)
Sodium: 143 mEq/L (ref 135–145)

## 2020-05-11 LAB — VITAMIN D 25 HYDROXY (VIT D DEFICIENCY, FRACTURES): VITD: 19.62 ng/mL — ABNORMAL LOW (ref 30.00–100.00)

## 2020-05-11 LAB — HEPATIC FUNCTION PANEL
ALT: 44 U/L (ref 0–53)
AST: 50 U/L — ABNORMAL HIGH (ref 0–37)
Albumin: 4.4 g/dL (ref 3.5–5.2)
Alkaline Phosphatase: 48 U/L (ref 39–117)
Bilirubin, Direct: 0.1 mg/dL (ref 0.0–0.3)
Total Bilirubin: 0.6 mg/dL (ref 0.2–1.2)
Total Protein: 6.9 g/dL (ref 6.0–8.3)

## 2020-05-11 LAB — MICROALBUMIN / CREATININE URINE RATIO
Creatinine,U: 130.2 mg/dL
Microalb Creat Ratio: 1.5 mg/g (ref 0.0–30.0)
Microalb, Ur: 2 mg/dL — ABNORMAL HIGH (ref 0.0–1.9)

## 2020-05-11 LAB — PSA: PSA: 0.08 ng/mL — ABNORMAL LOW (ref 0.10–4.00)

## 2020-05-11 LAB — VITAMIN B12: Vitamin B-12: 206 pg/mL — ABNORMAL LOW (ref 211–911)

## 2020-05-11 LAB — TSH: TSH: 3.14 u[IU]/mL (ref 0.35–4.50)

## 2020-05-11 NOTE — Progress Notes (Signed)
Subjective:    Patient ID: Leon Nelson, male    DOB: [birth year], 64 y.o.   MRN: [id number]  HPI  Here for wellness and f/u;  Overall doing ok;  Pt denies Chest pain, worsening SOB, DOE, wheezing, orthopnea, PND, worsening LE edema, palpitations, dizziness or syncope.  Pt denies neurological change such as new headache, facial or extremity weakness.  Pt denies polydipsia, polyuria, or low sugar symptoms. Pt states overall good compliance with treatment and medications, good tolerability, and has been trying to follow appropriate diet.  Pt denies worsening depressive symptoms, suicidal ideation or panic. No fever, night sweats, wt loss, loss of appetite, or other constitutional symptoms.  Pt states good ability with ADL's, has low fall risk, home safety reviewed and adequate, no other significant changes in hearing or vision, and not active with exercise.  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 Stopped the trulicity due to diarrhea.  Has recurrent leg cramps worse at night.  Had an episode of vertigo last wk with fall but resolved.  No other complaints Past Medical History:  Diagnosis Date  . ABDOMINAL PAIN OTHER SPECIFIED SITE 07/20/2008  . CHEST PAIN 02/06/2011  . COLONIC POLYPS, HX OF 05/26/2008  . DEPRESSION 05/26/2008  . DIABETES MELLITUS, TYPE II 05/26/2008  . HYPERLIPIDEMIA 05/26/2008  . HYPERTENSION 05/26/2008  . HYPOTHYROIDISM 05/26/2008  . KNEE PAIN, RIGHT, ACUTE 07/01/2008  . Personal history of urinary calculi 05/26/2008  . UTI'S, HX OF 05/26/2008   Past Surgical History:  Procedure Laterality Date  . s/p right knee arthroplasty      complicated by patellar tendon rupture Jan 2011 Dr. Gladstone Lighter  . TONSILLECTOMY      reports that he has never smoked. He has never used smokeless tobacco. He reports that he does not drink alcohol or use drugs. family  history includes Alcohol abuse in an other family member; Cancer in his mother; Diabetes in an other family member; Heart disease in his father and mother; Hyperlipidemia in his father and mother. Allergies  Allergen Reactions  . Penicillins Other (See Comments)    Blisters on hands  . Sulfonamide Derivatives Other (See Comments)    Blisters in groin area   Current Outpatient Medications on File Prior to Visit  Medication Sig Dispense Refill  . allopurinol (ZYLOPRIM) 300 MG tablet Take 1 tablet (300 mg total) by mouth daily. 90 tablet 1  . atenolol-chlorthalidone (TENORETIC) 50-25 MG tablet Take 0.5 tablets by mouth daily. 45 tablet 3  . Blood Glucose Monitoring Suppl (ONE TOUCH ULTRA 2) w/Device KIT Use as directed once daily E11.9 1 each 0  . diclofenac sodium (VOLTAREN) 1 % GEL Apply 2 g topically 4 (four) times daily as needed. (Patient taking differently: Apply 2 g topically 4 (four) times daily as needed (pain). ) 200 g 5  . dicyclomine (BENTYL) 20 MG tablet Take 1 tablet (20 mg total) by mouth 3 (three) times daily as needed. Keep scheduled appt in April for future refills 180 tablet 0  . gabapentin (NEURONTIN) 300 MG capsule Take 1 capsule (300 mg total) by mouth 2 (two) times daily. 180 capsule 2  . glipiZIDE (GLUCOTROL XL) 10 MG 24 hr tablet Take 1 tablet (10 mg total) by mouth daily with breakfast. 90 tablet 1  . glucose blood (ONE TOUCH ULTRA TEST)  test strip Use as instructed once daily E11.9 100 each 12  . KLOR-CON M20 20 MEQ tablet TAKE 2 TABLETS DAILY (Patient taking differently: Take 30 mEq by mouth daily. ) 180 tablet 3  . Lancets MISC Use as directed once daily E11.9 100 each 3  . levothyroxine (SYNTHROID) 50 MCG tablet Take 1 tablet (50 mcg total) by mouth daily. 90 tablet 1  . lisinopril (ZESTRIL) 20 MG tablet Take 1 tablet (20 mg total) by mouth daily. 90 tablet 2  . metFORMIN (GLUCOPHAGE) 1000 MG tablet Take 1 tablet (1,000 mg total) by mouth 2 (two) times daily with a  meal. 180 tablet 3  . simvastatin (ZOCOR) 80 MG tablet Take 1 tablet (80 mg total) by mouth at bedtime. 90 tablet 1  . venlafaxine XR (EFFEXOR-XR) 75 MG 24 hr capsule Take 1 capsule (75 mg total) by mouth daily. 90 capsule 1   No current facility-administered medications on file prior to visit.   Review of Systems All otherwise neg per pt    Objective:   Physical Exam BP 130/82 (BP Location: Left Arm, Patient Position: Sitting, Cuff Size: Large)   Pulse 61   Temp 99 F (37.2 C) (Oral)   Ht 6' (1.829 m)   Wt (!) 313 lb (142 kg)   SpO2 96%   BMI 42.45 kg/m  VS noted,  Constitutional: Pt appears in NAD HENT: Head: NCAT.  Right Ear: External ear normal.  Left Ear: External ear normal.  Eyes: . Pupils are equal, round, and reactive to light. Conjunctivae and EOM are normal Nose: without d/c or deformity Neck: Neck supple. Gross normal ROM Cardiovascular: Normal rate and regular rhythm.   Pulmonary/Chest: Effort normal and breath sounds without rales or wheezing.  Abd:  Soft, NT, ND, + BS, no organomegaly Neurological: Pt is alert. At baseline orientation, motor grossly intact Skin: Skin is warm. No rashes, other new lesions, no LE edema Psychiatric: Pt behavior is normal without agitation  All otherwise neg per pt Lab Results  Component Value Date   WBC 11.4 (H) 05/11/2020   HGB 14.7 05/11/2020   HCT 44.7 05/11/2020   PLT 280.0 05/11/2020   GLUCOSE 161 (H) 05/11/2020   CHOL 137 05/11/2020   TRIG 197.0 (H) 05/11/2020   HDL 34.80 (L) 05/11/2020   LDLDIRECT 65.0 06/04/2019   LDLCALC 63 05/11/2020   ALT 44 05/11/2020   AST 50 (H) 05/11/2020   NA 143 05/11/2020   K 4.0 05/11/2020   CL 101 05/11/2020   CREATININE 0.81 05/11/2020   BUN 12 05/11/2020   CO2 33 (H) 05/11/2020   TSH 3.14 05/11/2020   PSA 0.08 (L) 05/11/2020   INR 2.0 ratio (H) 01/16/2010   HGBA1C 7.5 (H) 05/11/2020   MICROALBUR 2.0 (H) 05/11/2020      Assessment & Plan:

## 2020-05-11 NOTE — Patient Instructions (Signed)

## 2020-05-12 ENCOUNTER — Other Ambulatory Visit: Payer: Self-pay | Admitting: Internal Medicine

## 2020-05-12 LAB — URINALYSIS, ROUTINE W REFLEX MICROSCOPIC
Bilirubin Urine: NEGATIVE
Hgb urine dipstick: NEGATIVE
Ketones, ur: NEGATIVE
Leukocytes,Ua: NEGATIVE
Nitrite: NEGATIVE
RBC / HPF: NONE SEEN (ref 0–?)
Specific Gravity, Urine: 1.02 (ref 1.000–1.030)
Total Protein, Urine: NEGATIVE
Urine Glucose: NEGATIVE
Urobilinogen, UA: 0.2 (ref 0.0–1.0)
pH: 6 (ref 5.0–8.0)

## 2020-05-12 LAB — HEMOGLOBIN A1C: Hgb A1c MFr Bld: 7.5 % — ABNORMAL HIGH (ref 4.6–6.5)

## 2020-05-12 MED ORDER — VITAMIN B-12 1000 MCG PO TABS
1000.0000 ug | ORAL_TABLET | Freq: Every day | ORAL | 3 refills | Status: DC
Start: 2020-05-12 — End: 2023-07-24

## 2020-05-12 MED ORDER — VITAMIN D (ERGOCALCIFEROL) 1.25 MG (50000 UNIT) PO CAPS: 50000.0000 [IU] | ORAL_CAPSULE | ORAL | 0 refills | Status: DC

## 2020-05-14 ENCOUNTER — Encounter: Payer: Self-pay | Admitting: Internal Medicine

## 2020-05-14 NOTE — Assessment & Plan Note (Signed)
stable overall by history and exam, recent data reviewed with pt, and pt to continue medical treatment as before,  to f/u any worsening symptoms or concerns  

## 2020-05-14 NOTE — Assessment & Plan Note (Signed)

## 2020-06-08 ENCOUNTER — Other Ambulatory Visit: Payer: Self-pay

## 2020-06-08 MED ORDER — ALLOPURINOL 300 MG PO TABS: 300.0000 mg | ORAL_TABLET | Freq: Every day | ORAL | 1 refills | Status: DC

## 2020-06-08 MED ORDER — GLIPIZIDE ER 10 MG PO TB24: 10.0000 mg | ORAL_TABLET | Freq: Every day | ORAL | 1 refills | Status: DC

## 2020-06-08 MED ORDER — VENLAFAXINE HCL ER 75 MG PO CP24: 75.0000 mg | ORAL_CAPSULE | Freq: Every day | ORAL | 1 refills | Status: DC

## 2020-06-08 MED ORDER — LEVOTHYROXINE SODIUM 50 MCG PO TABS: 50.0000 ug | ORAL_TABLET | Freq: Every day | ORAL | 1 refills | Status: DC

## 2020-06-08 MED ORDER — SIMVASTATIN 80 MG PO TABS: 80.0000 mg | ORAL_TABLET | Freq: Every day | ORAL | 1 refills | Status: DC

## 2020-06-09 ENCOUNTER — Telehealth: Payer: Self-pay

## 2020-06-09 NOTE — Telephone Encounter (Signed)
New message    The patient wife voiced   Over the counter B12  how many mg should take , please advise.

## 2020-06-14 NOTE — Telephone Encounter (Signed)
LVM for pts wife informing her that her husband is to take 1000mg  of Vit B12 a day.

## 2020-06-22 ENCOUNTER — Other Ambulatory Visit: Payer: Self-pay

## 2020-06-22 ENCOUNTER — Ambulatory Visit (INDEPENDENT_AMBULATORY_CARE_PROVIDER_SITE_OTHER): Payer: Medicare Other | Admitting: Internal Medicine

## 2020-06-22 ENCOUNTER — Encounter: Payer: Self-pay | Admitting: Internal Medicine

## 2020-06-22 VITALS — BP 140/66 | HR 76 | Temp 99.4°F | Ht 72.0 in | Wt 312.0 lb

## 2020-06-22 DIAGNOSIS — E1149 Type 2 diabetes mellitus with other diabetic neurological complication: Secondary | ICD-10-CM | POA: Diagnosis not present

## 2020-06-22 DIAGNOSIS — R519 Headache, unspecified: Secondary | ICD-10-CM | POA: Diagnosis not present

## 2020-06-22 DIAGNOSIS — R2689 Other abnormalities of gait and mobility: Secondary | ICD-10-CM

## 2020-06-22 DIAGNOSIS — I10 Essential (primary) hypertension: Secondary | ICD-10-CM | POA: Diagnosis not present

## 2020-06-22 DIAGNOSIS — R29818 Other symptoms and signs involving the nervous system: Secondary | ICD-10-CM

## 2020-06-22 DIAGNOSIS — R42 Dizziness and giddiness: Secondary | ICD-10-CM

## 2020-06-22 MED ORDER — DIAZEPAM 5 MG PO TABS: ORAL_TABLET | ORAL | 0 refills | Status: DC

## 2020-06-22 MED ORDER — DIAZEPAM 5 MG PO TABS
ORAL_TABLET | ORAL | 0 refills | Status: DC
Start: 2020-06-22 — End: 2020-06-22

## 2020-06-22 NOTE — Patient Instructions (Addendum)
Please take all new medication as prescribed  - the valium medication (1 pill) for before the MRI (sent to [facility])  You will be contacted regarding the referral for: MRI for the brain  Please continue all other medications as before, and refills have been done if requested.  Please have the pharmacy call with any other refills you may need.  Please continue your efforts at being more active, low cholesterol diet, and weight control.  Please keep your appointments with your specialists as you may have planned

## 2020-06-22 NOTE — Progress Notes (Signed)
Subjective:    Patient ID: Leon Nelson, male    DOB: 01-Nov-1956, 64 y.o.   MRN: [id number]  HPI  Here with worsening balance issue sort of dizziness but not really mild intermittent for several weeks, seems to start sometimes with lying on the right side , and very unusual fell out of bed.  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  Pt denies new neurological symptoms such as new facial or extremity weakness or numbness  Pt denies chest pain, increased sob or doe, wheezing, orthopnea, PND, increased LE swelling, palpitations, dizziness or syncope.  . Pt denies polydipsia, polyuria,  Pt denies fever, wt loss, night sweats, loss of appetite, or other constitutional symptoms   Past Medical History:  Diagnosis Date  . ABDOMINAL PAIN OTHER SPECIFIED SITE 07/20/2008  . CHEST PAIN 02/06/2011  . COLONIC POLYPS, HX OF 05/26/2008  . DEPRESSION 05/26/2008  . DIABETES MELLITUS, TYPE II 05/26/2008  . HYPERLIPIDEMIA 05/26/2008  . HYPERTENSION 05/26/2008  . HYPOTHYROIDISM 05/26/2008  . KNEE PAIN, RIGHT, ACUTE 07/01/2008  . Personal history of urinary calculi 05/26/2008  . UTI'S, HX OF 05/26/2008   Past Surgical History:  Procedure Laterality Date  . s/p right knee arthroplasty      complicated by patellar tendon rupture Jan 2011 Dr. Gladstone Lighter  . TONSILLECTOMY      reports that he has never smoked. He has never used smokeless tobacco. He reports that he does not drink alcohol and does not use drugs. family history includes Alcohol abuse in an other family member; Cancer in his mother; Diabetes in an other family member; Heart disease in his father and mother; Hyperlipidemia in his father and mother. Allergies  Allergen Reactions  . Penicillins Other (See Comments)    Blisters on hands  . Sulfonamide Derivatives Other (See Comments)     Blisters in groin area   Current Outpatient Medications on File Prior to Visit  Medication Sig Dispense Refill  . allopurinol (ZYLOPRIM) 300 MG tablet Take 1 tablet (300 mg total) by mouth daily. 90 tablet 1  . atenolol-chlorthalidone (TENORETIC) 50-25 MG tablet Take 0.5 tablets by mouth daily. 45 tablet 3  . Blood Glucose Monitoring Suppl (ONE TOUCH ULTRA 2) w/Device KIT Use as directed once daily E11.9 1 each 0  . diclofenac sodium (VOLTAREN) 1 % GEL Apply 2 g topically 4 (four) times daily as needed. (Patient taking differently: Apply 2 g topically 4 (four) times daily as needed (pain). ) 200 g 5  . dicyclomine (BENTYL) 20 MG tablet Take 1 tablet (20 mg total) by mouth 3 (three) times daily as needed. Keep scheduled appt in April for future refills 180 tablet 0  . gabapentin (NEURONTIN) 300 MG capsule Take 1 capsule (300 mg total) by mouth 2 (two) times daily. 180 capsule 2  . glipiZIDE (GLUCOTROL XL) 10 MG 24 hr tablet Take 1 tablet (10 mg total) by mouth daily with breakfast. 90 tablet 1  . glucose blood (ONE TOUCH ULTRA TEST) test strip Use as instructed once daily E11.9 100 each 12  . KLOR-CON M20 20 MEQ tablet TAKE 2 TABLETS DAILY (Patient taking differently: Take 30 mEq by mouth daily. ) 180 tablet 3  . Lancets MISC Use as directed once daily E11.9 100 each 3  . levothyroxine (SYNTHROID) 50 MCG  tablet Take 1 tablet (50 mcg total) by mouth daily. 90 tablet 1  . lisinopril (ZESTRIL) 20 MG tablet Take 1 tablet (20 mg total) by mouth daily. 90 tablet 2  . metFORMIN (GLUCOPHAGE) 1000 MG tablet Take 1 tablet (1,000 mg total) by mouth 2 (two) times daily with a meal. 180 tablet 3  . simvastatin (ZOCOR) 80 MG tablet Take 1 tablet (80 mg total) by mouth at bedtime. 90 tablet 1  . venlafaxine XR (EFFEXOR-XR) 75 MG 24 hr capsule Take 1 capsule (75 mg total) by mouth daily. 90 capsule 1  . vitamin B-12 (CYANOCOBALAMIN) 1000 MCG tablet Take 1 tablet (1,000 mcg total) by mouth daily. 90 tablet 3  .  Vitamin D, Ergocalciferol, (DRISDOL) 1.25 MG (50000 UNIT) CAPS capsule Take 1 capsule (50,000 Units total) by mouth every 7 (seven) days. 12 capsule 0   No current facility-administered medications on file prior to visit.   Review of Systems All otherwise neg per pt     Objective:   Physical Exam BP 140/66 (BP Location: Left Arm, Patient Position: Sitting, Cuff Size: Large)   Pulse 76   Temp 99.4 F (37.4 C) (Oral)   Ht 6' (1.829 m)   Wt (!) 312 lb (141.5 kg)   SpO2 97%   BMI 42.31 kg/m  VS noted,  Constitutional: Pt appears in NAD HENT: Head: NCAT.  Right Ear: External ear normal.  Left Ear: External ear normal.  Eyes: . Pupils are equal, round, and reactive to light. Conjunctivae and EOM are normal Nose: without d/c or deformity Neck: Neck supple. Gross normal ROM Cardiovascular: Normal rate and regular rhythm.   Pulmonary/Chest: Effort normal and breath sounds without rales or wheezing.  Abd:  Soft, NT, ND, + BS, no organomegaly Neurological: Pt is alert. At baseline orientation, motor grossly intact, has some swaying to getting up out of chair and first few steps Skin: Skin is warm. No rashes, other new lesions, no LE edema Psychiatric: Pt behavior is normal without agitation  All otherwise neg per pt Lab Results  Component Value Date   WBC 11.4 (H) 05/11/2020   HGB 14.7 05/11/2020   HCT 44.7 05/11/2020   PLT 280.0 05/11/2020   GLUCOSE 161 (H) 05/11/2020   CHOL 137 05/11/2020   TRIG 197.0 (H) 05/11/2020   HDL 34.80 (L) 05/11/2020   LDLDIRECT 65.0 06/04/2019   LDLCALC 63 05/11/2020   ALT 44 05/11/2020   AST 50 (H) 05/11/2020   NA 143 05/11/2020   K 4.0 05/11/2020   CL 101 05/11/2020   CREATININE 0.81 05/11/2020   BUN 12 05/11/2020   CO2 33 (H) 05/11/2020   TSH 3.14 05/11/2020   PSA 0.08 (L) 05/11/2020   INR 2.0 ratio (H) 01/16/2010   HGBA1C 7.5 (H) 05/11/2020   MICROALBUR 2.0 (H) 05/11/2020        Assessment & Plan:

## 2020-06-26 ENCOUNTER — Encounter: Payer: Self-pay | Admitting: Internal Medicine

## 2020-06-26 NOTE — Assessment & Plan Note (Signed)
As above.

## 2020-06-26 NOTE — Assessment & Plan Note (Signed)
Possibly element of vertigo, mentions occipital pain, declines ED referral

## 2020-06-26 NOTE — Assessment & Plan Note (Signed)
stable overall by history and exam, recent data reviewed with pt, and pt to continue medical treatment as before,  to f/u any worsening symptoms or concerns  

## 2020-06-26 NOTE — Assessment & Plan Note (Addendum)
Etiology unclear, possibly vertigo related, for MRI brain, consider neurology referral  I spent 31 minutes in preparing to see the patient by review of recent labs, imaging and procedures, obtaining and reviewing separately obtained history, communicating with the patient and family or caregiver, ordering medications, tests or procedures, and documenting clinical information in the EHR including the differential Dx, treatment, and any further evaluation and other management of balance d/o, dizziness, HA, DM, htn

## 2020-07-10 ENCOUNTER — Other Ambulatory Visit: Payer: Self-pay | Admitting: Internal Medicine

## 2020-07-11 NOTE — Telephone Encounter (Signed)
Please change to OTC Vitamin D3 at 2000 units per day, indefinitely.  

## 2020-08-03 ENCOUNTER — Other Ambulatory Visit: Payer: Self-pay

## 2020-08-03 ENCOUNTER — Encounter: Payer: Self-pay | Admitting: Internal Medicine

## 2020-08-03 ENCOUNTER — Ambulatory Visit
Admission: RE | Admit: 2020-08-03 | Discharge: 2020-08-03 | Disposition: A | Discharge status: Home / Self Care | Payer: Medicare Other | Source: Ambulatory Visit | Attending: Internal Medicine | Admitting: Internal Medicine

## 2020-08-03 DIAGNOSIS — J3489 Other specified disorders of nose and nasal sinuses: Secondary | ICD-10-CM | POA: Diagnosis not present

## 2020-08-03 DIAGNOSIS — J321 Chronic frontal sinusitis: Secondary | ICD-10-CM | POA: Diagnosis not present

## 2020-08-03 DIAGNOSIS — R2689 Other abnormalities of gait and mobility: Secondary | ICD-10-CM | POA: Diagnosis not present

## 2020-08-03 DIAGNOSIS — J322 Chronic ethmoidal sinusitis: Secondary | ICD-10-CM | POA: Diagnosis not present

## 2020-08-03 DIAGNOSIS — R29818 Other symptoms and signs involving the nervous system: Secondary | ICD-10-CM

## 2020-08-31 ENCOUNTER — Other Ambulatory Visit: Payer: Self-pay | Admitting: Podiatry

## 2020-09-01 NOTE — Telephone Encounter (Signed)
Please advise 

## 2020-09-21 ENCOUNTER — Other Ambulatory Visit: Payer: Self-pay

## 2020-09-21 ENCOUNTER — Ambulatory Visit: Payer: Medicare Other | Admitting: Podiatry

## 2020-09-21 DIAGNOSIS — B351 Tinea unguium: Secondary | ICD-10-CM

## 2020-09-21 DIAGNOSIS — E1149 Type 2 diabetes mellitus with other diabetic neurological complication: Secondary | ICD-10-CM

## 2020-09-21 DIAGNOSIS — M79675 Pain in left toe(s): Secondary | ICD-10-CM

## 2020-09-21 DIAGNOSIS — M79674 Pain in right toe(s): Secondary | ICD-10-CM

## 2020-09-22 ENCOUNTER — Encounter: Payer: Self-pay | Admitting: Podiatry

## 2020-09-22 NOTE — Progress Notes (Signed)
  Subjective:  Patient ID: Leon Nelson, male    DOB: May 22, 1956,  MRN: [id number]  Chief Complaint  Patient presents with  . Nail Problem    Nail trim 1-5 bilateral   64 y.o. male returns for the above complaint.  Patient presents with thickened elongated dystrophic toenails x10.  Patient would like to have them debrided down.  He denies any other acute complaints.  He has been not been able to walk properly because of the pain when ambulating.  He has not been able to take care of himself.  Objective:  There were no vitals filed for this visit. Podiatric Exam: Vascular: dorsalis pedis and posterior tibial pulses are palpable bilateral. Capillary return is immediate. Temperature gradient is WNL. Skin turgor WNL  Sensorium: Normal Semmes Weinstein monofilament test. Normal tactile sensation bilaterally. Nail Exam: Pt has thick disfigured discolored nails with subungual debris noted bilateral entire nail hallux through fifth toenails.  Pain on palpation to the nails. Ulcer Exam: There is no evidence of ulcer or pre-ulcerative changes or infection. Orthopedic Exam: Muscle tone and strength are WNL. No limitations in general ROM. No crepitus or effusions noted. HAV  B/L.  Hammer toes 2-5  B/L. Skin: No Porokeratosis. No infection or ulcers    Assessment & Plan:   1. Type II diabetes mellitus with neurological manifestations ([place])   2. Pain due to onychomycosis of toenails of both feet     Patient was evaluated and treated and all questions answered.  Onychomycosis with pain  -Nails palliatively debrided as below. -Educated on self-care  Procedure: Nail Debridement Rationale: pain  Type of Debridement: manual, sharp debridement. Instrumentation: Nail nipper, rotary burr. Number of Nails: 10  Procedures and Treatment: Consent by patient was obtained for treatment procedures. The patient understood the discussion of treatment and procedures well. All questions were answered  thoroughly reviewed. Debridement of mycotic and hypertrophic toenails, 1 through 5 bilateral and clearing of subungual debris. No ulceration, no infection noted.  Return Visit-Office Procedure: Patient instructed to return to the office for a follow up visit 3 months for continued evaluation and treatment.  Boneta Lucks, DPM    No follow-ups on file.

## 2020-10-18 ENCOUNTER — Telehealth: Payer: Self-pay | Admitting: Internal Medicine

## 2020-10-18 MED ORDER — POTASSIUM CHLORIDE CRYS ER 20 MEQ PO TBCR: 40.0000 meq | EXTENDED_RELEASE_TABLET | Freq: Every day | ORAL | 2 refills | Status: DC

## 2020-10-18 NOTE — Telephone Encounter (Signed)
potassium chloride ER Patient requesting a refill for medication  [facility], [address], [address] Phone:  [phone]  Fax:  [phone]

## 2020-10-18 NOTE — Telephone Encounter (Signed)
Sent to Dr. Jenny Reichmann as the med is not on pts med list.

## 2020-10-18 NOTE — Telephone Encounter (Signed)
Done erx 

## 2020-10-31 ENCOUNTER — Other Ambulatory Visit: Payer: Self-pay | Admitting: Internal Medicine

## 2020-11-02 ENCOUNTER — Other Ambulatory Visit: Payer: Self-pay

## 2020-11-02 ENCOUNTER — Other Ambulatory Visit (INDEPENDENT_AMBULATORY_CARE_PROVIDER_SITE_OTHER): Payer: Medicare Other

## 2020-11-02 DIAGNOSIS — E1149 Type 2 diabetes mellitus with other diabetic neurological complication: Secondary | ICD-10-CM | POA: Diagnosis not present

## 2020-11-02 LAB — BASIC METABOLIC PANEL
BUN: 18 mg/dL (ref 6–23)
CO2: 31 mEq/L (ref 19–32)
Calcium: 8.9 mg/dL (ref 8.4–10.5)
Chloride: 102 mEq/L (ref 96–112)
Creatinine, Ser: 0.78 mg/dL (ref 0.40–1.50)
GFR: 93.33 mL/min (ref >60.00)
Glucose, Bld: 163 mg/dL — ABNORMAL HIGH (ref 70–99)
Potassium: 4.1 mEq/L (ref 3.5–5.1)
Sodium: 140 mEq/L (ref 135–145)

## 2020-11-02 LAB — LIPID PANEL
Cholesterol: 109 mg/dL (ref 0–200)
HDL: 31.6 mg/dL — ABNORMAL LOW (ref >39.00)
LDL Cholesterol: 51 mg/dL (ref 0–99)
NonHDL: 77.28
Total CHOL/HDL Ratio: 3
Triglycerides: 130 mg/dL (ref 0.0–149.0)
VLDL: 26 mg/dL (ref 0.0–40.0)

## 2020-11-02 LAB — HEPATIC FUNCTION PANEL
ALT: 34 U/L (ref 0–53)
AST: 32 U/L (ref 0–37)
Albumin: 4.1 g/dL (ref 3.5–5.2)
Alkaline Phosphatase: 39 U/L (ref 39–117)
Bilirubin, Direct: 0.1 mg/dL (ref 0.0–0.3)
Total Bilirubin: 0.4 mg/dL (ref 0.2–1.2)
Total Protein: 6.6 g/dL (ref 6.0–8.3)

## 2020-11-02 LAB — HEMOGLOBIN A1C: Hgb A1c MFr Bld: 8 % — ABNORMAL HIGH (ref 4.6–6.5)

## 2020-11-09 ENCOUNTER — Ambulatory Visit: Payer: Medicare Other | Admitting: Internal Medicine

## 2020-11-09 ENCOUNTER — Ambulatory Visit (INDEPENDENT_AMBULATORY_CARE_PROVIDER_SITE_OTHER): Payer: Medicare Other | Admitting: Internal Medicine

## 2020-11-09 ENCOUNTER — Encounter: Payer: Self-pay | Admitting: Internal Medicine

## 2020-11-09 ENCOUNTER — Other Ambulatory Visit: Payer: Self-pay

## 2020-11-09 VITALS — BP 110/70 | HR 87 | Temp 98.4°F | Ht 72.0 in | Wt 306.0 lb

## 2020-11-09 DIAGNOSIS — E039 Hypothyroidism, unspecified: Secondary | ICD-10-CM

## 2020-11-09 DIAGNOSIS — I10 Essential (primary) hypertension: Secondary | ICD-10-CM

## 2020-11-09 DIAGNOSIS — E1149 Type 2 diabetes mellitus with other diabetic neurological complication: Secondary | ICD-10-CM | POA: Diagnosis not present

## 2020-11-09 DIAGNOSIS — Z0000 Encounter for general adult medical examination without abnormal findings: Secondary | ICD-10-CM

## 2020-11-09 DIAGNOSIS — E782 Mixed hyperlipidemia: Secondary | ICD-10-CM

## 2020-11-09 NOTE — Patient Instructions (Signed)
Please make sure to call for your yearly eye exam  Please continue all other medications as before, and refills have been done if requested.  Please have the pharmacy call with any other refills you may need.  Please continue your efforts at being more active, low cholesterol diet, and weight control  Please keep your appointments with your specialists as you may have planned  Please go to the LAB at the blood drawing area for the tests to be done  You will be contacted by phone if any changes need to be made immediately.  Otherwise, you will receive a letter about your results with an explanation, but please check with MyChart first.  Please remember to sign up for MyChart if you have not done so, as this will be important to you in the future with finding out test results, communicating by private email, and scheduling acute appointments online when needed.  Please make an Appointment to return in 6 months, or sooner if needed, also with Lab Appointment for testing done 3-5 days before at the [facility] (so this is for TWO appointments - please see the scheduling desk as you leave)

## 2020-11-09 NOTE — Progress Notes (Signed)
Subjective:    Patient ID: Leon Nelson, male    DOB: 1955-12-28, 64 y.o.   MRN: [id number]  HPI  Here to f/u; overall doing ok,  Pt denies chest pain, increasing sob or doe, wheezing, orthopnea, PND, increased LE swelling, palpitations, dizziness or syncope.  Pt denies new neurological symptoms such as new headache, or facial or extremity weakness or numbness.  Pt denies polydipsia, polyuria, or low sugar episode.  Pt states overall good compliance with meds, mostly trying to follow appropriate diet, with wt overall stable,  but little exercise however.  Plans to call for eye exam soon.  Sugar have been elevated due to increased starches in diet given per wife, and both now realize they need to work on diet.  Denies hyper or hypo thyroid symptoms such as voice, skin or hair change. Past Medical History:  Diagnosis Date  . ABDOMINAL PAIN OTHER SPECIFIED SITE 07/20/2008  . CHEST PAIN 02/06/2011  . COLONIC POLYPS, HX OF 05/26/2008  . DEPRESSION 05/26/2008  . DIABETES MELLITUS, TYPE II 05/26/2008  . HYPERLIPIDEMIA 05/26/2008  . HYPERTENSION 05/26/2008  . HYPOTHYROIDISM 05/26/2008  . KNEE PAIN, RIGHT, ACUTE 07/01/2008  . Personal history of urinary calculi 05/26/2008  . UTI'S, HX OF 05/26/2008   Past Surgical History:  Procedure Laterality Date  . s/p right knee arthroplasty      complicated by patellar tendon rupture Jan 2011 Dr. Gladstone Lighter  . TONSILLECTOMY      reports that he has never smoked. He has never used smokeless tobacco. He reports that he does not drink alcohol and does not use drugs. family history includes Alcohol abuse in an other family member; Cancer in his mother; Diabetes in an other family member; Heart disease in his father and mother; Hyperlipidemia in his father and mother. Allergies  Allergen Reactions  . Penicillins Other (See Comments)    Blisters on hands  . Sulfonamide Derivatives Other (See Comments)    Blisters in groin area   Current Outpatient Medications on File Prior  to Visit  Medication Sig Dispense Refill  . allopurinol (ZYLOPRIM) 300 MG tablet Take 1 tablet (300 mg total) by mouth daily. 90 tablet 1  . atenolol-chlorthalidone (TENORETIC) 50-25 MG tablet Take 0.5 tablets by mouth daily. 45 tablet 3  . Blood Glucose Monitoring Suppl (ONE TOUCH ULTRA 2) w/Device KIT Use as directed once daily E11.9 1 each 0  . diazepam (VALIUM) 5 MG tablet 1 tab by mouth at 45 min prior to procedure 1 tablet 0  . diclofenac sodium (VOLTAREN) 1 % GEL Apply 2 g topically 4 (four) times daily as needed. 200 g 5  . dicyclomine (BENTYL) 20 MG tablet Take 1 tablet (20 mg total) by mouth 3 (three) times daily as needed. Keep scheduled appt in April for future refills 180 tablet 0  . gabapentin (NEURONTIN) 300 MG capsule TAKE 1 CAPSULE BY MOUTH  TWICE DAILY 180 capsule 3  . glipiZIDE (GLUCOTROL XL) 10 MG 24 hr tablet Take 1 tablet (10 mg total) by mouth daily with breakfast. 90 tablet 1  . glucose blood (ONE TOUCH ULTRA TEST) test strip Use as instructed once daily E11.9 100 each 12  . Lancets MISC Use as directed once daily E11.9 100 each 3  . levothyroxine (SYNTHROID) 50 MCG tablet Take 1 tablet (50 mcg total) by mouth daily. 90 tablet 1  . lisinopril (ZESTRIL) 20 MG tablet TAKE 1 TABLET BY MOUTH  DAILY 90 tablet 3  . metFORMIN (GLUCOPHAGE) 1000  MG tablet Take 1 tablet (1,000 mg total) by mouth 2 (two) times daily with a meal. 180 tablet 3  . potassium chloride SA (KLOR-CON M20) 20 MEQ tablet Take 2 tablets (40 mEq total) by mouth daily. 180 tablet 2  . simvastatin (ZOCOR) 80 MG tablet Take 1 tablet (80 mg total) by mouth at bedtime. 90 tablet 1  . venlafaxine XR (EFFEXOR-XR) 75 MG 24 hr capsule Take 1 capsule (75 mg total) by mouth daily. 90 capsule 1  . vitamin B-12 (CYANOCOBALAMIN) 1000 MCG tablet Take 1 tablet (1,000 mcg total) by mouth daily. 90 tablet 3   No current facility-administered medications on file prior to visit.   Review of Systems All otherwise neg per pt      Objective:   Physical Exam BP 110/70 (BP Location: Left Arm, Patient Position: Sitting, Cuff Size: Large)   Pulse 87   Temp 98.4 F (36.9 C) (Oral)   Ht 6' (1.829 m)   Wt (!) 306 lb (138.8 kg)   SpO2 93%   BMI 41.50 kg/m  VS noted,  Constitutional: Pt appears in NAD HENT: Head: NCAT.  Right Ear: External ear normal.  Left Ear: External ear normal.  Eyes: . Pupils are equal, round, and reactive to light. Conjunctivae and EOM are normal Nose: without d/c or deformity Neck: Neck supple. Gross normal ROM Cardiovascular: Normal rate and regular rhythm.   Pulmonary/Chest: Effort normal and breath sounds without rales or wheezing.  Abd:  Soft, NT, ND, + BS, no organomegaly Neurological: Pt is alert. At baseline orientation, motor grossly intact Skin: Skin is warm. No rashes, other new lesions, no LE edema Psychiatric: Pt behavior is normal without agitation  All otherwise neg per pt Lab Results  Component Value Date   WBC 11.4 (H) 05/11/2020   HGB 14.7 05/11/2020   HCT 44.7 05/11/2020   PLT 280.0 05/11/2020   GLUCOSE 163 (H) 11/02/2020   CHOL 109 11/02/2020   TRIG 130.0 11/02/2020   HDL 31.60 (L) 11/02/2020   LDLDIRECT 65.0 06/04/2019   LDLCALC 51 11/02/2020   ALT 34 11/02/2020   AST 32 11/02/2020   NA 140 11/02/2020   K 4.1 11/02/2020   CL 102 11/02/2020   CREATININE 0.78 11/02/2020   BUN 18 11/02/2020   CO2 31 11/02/2020   TSH 3.14 05/11/2020   PSA 0.08 (L) 05/11/2020   INR 2.0 ratio (H) 01/16/2010   HGBA1C 8.0 (H) 11/02/2020   MICROALBUR 2.0 (H) 05/11/2020      Assessment & Plan:

## 2020-11-12 ENCOUNTER — Encounter: Payer: Self-pay | Admitting: Internal Medicine

## 2020-11-12 NOTE — Assessment & Plan Note (Signed)
stable overall by history and exam, recent data reviewed with pt, and pt to continue medical treatment as before,  to f/u any worsening symptoms or concerns  

## 2020-11-12 NOTE — Addendum Note (Signed)
Addended by: Biagio Borg on: 11/12/2020 07:29 PM   Modules accepted: Orders

## 2020-11-12 NOTE — Assessment & Plan Note (Addendum)
Uncontrolled due to diet excess,declines change in med tx, to work on diet, wt loss, exercise, and f/u a1c with labs next visit  I spent 31 minutes in preparing to see the patient by review of recent labs, imaging and procedures, obtaining and reviewing separately obtained history, communicating with the patient and family or caregiver, ordering medications, tests or procedures, and documenting clinical information in the EHR including the differential Dx, treatment, and any further evaluation and other management of dm, htn, hld, low thyroid

## 2020-11-23 ENCOUNTER — Telehealth: Payer: Self-pay | Admitting: Internal Medicine

## 2020-11-23 ENCOUNTER — Ambulatory Visit: Payer: Medicare Other

## 2020-11-23 NOTE — Telephone Encounter (Signed)
LVM for pt to rtn my call at [phone] to r/s his appt with [facility] on 11/23/20.

## 2020-11-24 ENCOUNTER — Other Ambulatory Visit: Payer: Self-pay | Admitting: Internal Medicine

## 2020-12-02 ENCOUNTER — Other Ambulatory Visit: Payer: Self-pay | Admitting: Internal Medicine

## 2020-12-03 NOTE — Telephone Encounter (Signed)
Please refill as per office routine med refill policy (all routine meds refilled for 3 mo or monthly per pt preference up to one year from last visit, then month to month grace period for 3 mo, then further med refills will have to be denied)  

## 2020-12-07 ENCOUNTER — Ambulatory Visit (INDEPENDENT_AMBULATORY_CARE_PROVIDER_SITE_OTHER): Payer: Medicare Other

## 2020-12-07 DIAGNOSIS — Z0000 Encounter for general adult medical examination without abnormal findings: Secondary | ICD-10-CM

## 2020-12-07 NOTE — Progress Notes (Signed)
I connected with Leon Nelson today by telephone and verified that I am speaking with the correct person using two identifiers. Location patient: home Location provider: work Persons participating in the virtual visit: Leon Nelson and Leon Abu, LPN.   I discussed the limitations, risks, security and privacy concerns of performing an evaluation and management service by telephone and the availability of in person appointments. I also discussed with the patient that there may be a patient responsible charge related to this service. The patient expressed understanding and verbally consented to this telephonic visit.    Interactive audio and video telecommunications were attempted between this provider and patient, however failed, due to patient having technical difficulties OR patient did not have access to video capability.  We continued and completed visit with audio only.  Some vital signs may be absent or patient reported.   Time Spent with patient on telephone encounter: 30 minutes  Subjective:   Leon Nelson is a 64 y.o. male who presents for Medicare Annual/Subsequent preventive examination.  Review of Systems    No ROS. Medicare Wellness Visit. Additional risk factors are reflected in social history. Cardiac Risk Factors include: advanced age (>33mn, >>58women);diabetes mellitus;dyslipidemia;family history of premature cardiovascular disease;hypertension;male gender;obesity (BMI >30kg/m2)     Objective:    There were no vitals filed for this visit. There is no height or weight on file to calculate BMI.  Advanced Directives 12/07/2020 01/08/2019 01/07/2019  Does Patient Have a Medical Advance Directive? Yes No No  Type of Advance Directive Living will;Healthcare Power of Attorney - -  Does patient want to make changes to medical advance directive? No - Patient declined - -  Copy of [place] Chart? No - copy requested - -  Would patient like  information on creating a medical advance directive? - No - Patient declined No - Patient declined    Current Medications (verified) Outpatient Encounter Medications as of 12/07/2020  Medication Sig  . allopurinol (ZYLOPRIM) 300 MG tablet TAKE 1 TABLET BY MOUTH  DAILY  . atenolol-chlorthalidone (TENORETIC) 50-25 MG tablet Take 0.5 tablets by mouth daily.  . Blood Glucose Monitoring Suppl (ONE TOUCH ULTRA 2) w/Device KIT Use as directed once daily E11.9  . diazepam (VALIUM) 5 MG tablet 1 tab by mouth at 45 min prior to procedure  . diclofenac sodium (VOLTAREN) 1 % GEL Apply 2 g topically 4 (four) times daily as needed.  . dicyclomine (BENTYL) 20 MG tablet Take 1 tablet (20 mg total) by mouth 3 (three) times daily as needed. Keep scheduled appt in April for future refills  . gabapentin (NEURONTIN) 300 MG capsule TAKE 1 CAPSULE BY MOUTH  TWICE DAILY  . glipiZIDE (GLUCOTROL XL) 10 MG 24 hr tablet TAKE 1 TABLET BY MOUTH  DAILY WITH BREAKFAST  . glucose blood (ONE TOUCH ULTRA TEST) test strip Use as instructed once daily E11.9  . Lancets MISC Use as directed once daily E11.9  . levothyroxine (SYNTHROID) 50 MCG tablet TAKE 1 TABLET BY MOUTH  DAILY  . lisinopril (ZESTRIL) 20 MG tablet TAKE 1 TABLET BY MOUTH  DAILY  . metFORMIN (GLUCOPHAGE) 1000 MG tablet TAKE 1 TABLET BY MOUTH  TWICE DAILY WITH MEALS  . potassium chloride SA (KLOR-CON M20) 20 MEQ tablet Take 2 tablets (40 mEq total) by mouth daily.  . simvastatin (ZOCOR) 80 MG tablet TAKE 1 TABLET BY MOUTH AT  BEDTIME  . venlafaxine XR (EFFEXOR-XR) 75 MG 24 hr capsule TAKE 1 CAPSULE  BY MOUTH  DAILY  . vitamin B-12 (CYANOCOBALAMIN) 1000 MCG tablet Take 1 tablet (1,000 mcg total) by mouth daily.   No facility-administered encounter medications on file as of 12/07/2020.    Allergies (verified) Penicillins and Sulfonamide derivatives   History: Past Medical History:  Diagnosis Date  . ABDOMINAL PAIN OTHER SPECIFIED SITE 07/20/2008  . CHEST PAIN  02/06/2011  . COLONIC POLYPS, HX OF 05/26/2008  . DEPRESSION 05/26/2008  . DIABETES MELLITUS, TYPE II 05/26/2008  . HYPERLIPIDEMIA 05/26/2008  . HYPERTENSION 05/26/2008  . HYPOTHYROIDISM 05/26/2008  . KNEE PAIN, RIGHT, ACUTE 07/01/2008  . Personal history of urinary calculi 05/26/2008  . UTI'S, HX OF 05/26/2008   Past Surgical History:  Procedure Laterality Date  . s/p right knee arthroplasty      complicated by patellar tendon rupture Jan 2011 Dr. Gladstone Lighter  . TONSILLECTOMY     Family History  Problem Relation Age of Onset  . Cancer Mother        breast cancer  . Hyperlipidemia Mother   . Heart disease Mother   . Hyperlipidemia Father   . Heart disease Father   . Alcohol abuse Other   . Diabetes Other    Social History   Socioeconomic History  . Marital status: Married    Spouse name: Not on file  . Number of children: Not on file  . Years of education: Not on file  . Highest education level: Not on file  Occupational History  . Not on file  Tobacco Use  . Smoking status: Never Smoker  . Smokeless tobacco: Never Used  Substance and Sexual Activity  . Alcohol use: No  . Drug use: No  . Sexual activity: Not on file  Other Topics Concern  . Not on file  Social History Narrative  . Not on file   Social Determinants of Health   Financial Resource Strain: Low Risk   . Difficulty of Paying Living Expenses: Not hard at all  Food Insecurity: No Food Insecurity  . Worried About Charity fundraiser in the Last Year: Never true  . Ran Out of Food in the Last Year: Never true  Transportation Needs: No Transportation Needs  . Lack of Transportation (Medical): No  . Lack of Transportation (Non-Medical): No  Physical Activity: Inactive  . Days of Exercise per Week: 0 days  . Minutes of Exercise per Session: 0 min  Stress: No Stress Concern Present  . Feeling of Stress : Not at all  Social Connections: Socially Integrated  . Frequency of Communication with Friends and Family: More than  three times a week  . Frequency of Social Gatherings with Friends and Family: Once a week  . Attends Religious Services: More than 4 times per year  . Active Member of Clubs or Organizations: No  . Attends Archivist Meetings: More than 4 times per year  . Marital Status: Married    Tobacco Counseling Counseling given: Not Answered   Clinical Intake:  Pre-visit preparation completed: Yes  Pain : No/denies pain     Nutritional Risks: None Diabetes: Yes CBG done?: No Did pt. bring in CBG monitor from home?: No  How often do you need to have someone help you when you read instructions, pamphlets, or other written materials from your doctor or pharmacy?: 1 - Never What is the last grade level you completed in school?: [employer] Graduate  Diabetic? yes  Interpreter Needed?: No  Information entered by :: Leon Tamburro N. Lowell Guitar, LPN  Activities of Daily Living In your present state of health, do you have any difficulty performing the following activities: 12/07/2020 11/09/2020  Hearing? N N  Vision? N N  Difficulty concentrating or making decisions? N N  Walking or climbing stairs? N N  Dressing or bathing? N N  Doing errands, shopping? N N  Preparing Food and eating ? N -  Using the Toilet? N -  In the past six months, have you accidently leaked urine? N -  Do you have problems with loss of bowel control? N -  Managing your Medications? N -  Managing your Finances? N -  Housekeeping or managing your Housekeeping? N -  Some recent data might be hidden    Patient Care Team: Biagio Borg, MD as PCP - General  Indicate any recent Medical Services you may have received from other than [facility] providers in the past year (date may be approximate).     Assessment:   This is a routine wellness examination for Leon Nelson.  Hearing/Vision screen No exam data present  Dietary issues and exercise activities discussed: Current Exercise Habits: The patient does not  participate in regular exercise at present, Exercise limited by: orthopedic condition(s);Other - see comments (neuropathy in feet)  Goals   None    Depression Screen PHQ 2/9 Scores 12/07/2020 11/09/2020 05/11/2020 05/11/2020 11/06/2019 03/27/2017 09/25/2016  PHQ - 2 Score 0 0 0 0 0 0 0    Fall Risk Fall Risk  12/07/2020 11/09/2020 05/11/2020 05/11/2020 11/06/2019  Falls in the past year? 0 0 '1 1 1  ' Number falls in past yr: 0 0 0 0 0  Injury with Fall? 0 0 0 0 0  Comment - - - - -  Risk for fall due to : No Fall Risks No Fall Risks - History of fall(s) -  Follow up Falls evaluation completed Falls evaluation completed - Falls evaluation completed -    FALL RISK PREVENTION PERTAINING TO THE HOME:  Any stairs in or around the home? No  If so, are there any without handrails? No  Home free of loose throw rugs in walkways, pet beds, electrical cords, etc? Yes  Adequate lighting in your home to reduce risk of falls? Yes   ASSISTIVE DEVICES UTILIZED TO PREVENT FALLS:  Life alert? No  Use of a cane, walker or w/c? No  Grab bars in the bathroom? No  Shower chair or bench in shower? No  Elevated toilet seat or a handicapped toilet? Yes   TIMED UP AND GO:  Was the test performed? No .  Length of time to ambulate 10 feet: 0 sec.   Gait steady and fast without use of assistive device  Cognitive Function: Patient is cogitatively intact.        Immunizations Immunization History  Administered Date(s) Administered  . Fluad Quad(high Dose 65+) 12/04/2020  . H1N1 01/24/2009  . Influenza Split 09/21/2011  . Influenza Whole 09/23/2008, 09/23/2009  . Influenza, Seasonal, Injecte, Preservative Fre 02/11/2013  . Influenza,inj,Quad PF,6+ Mos 01/20/2014, 09/16/2015, 11/06/2019  . Influenza-Unspecified 09/22/2016, 10/20/2018  . PFIZER SARS-COV-2 Vaccination 01/25/2020, 02/15/2020, 12/04/2020  . Pneumococcal Conjugate-13 11/06/2019  . Pneumococcal Polysaccharide-23 12/24/2005, 07/21/2014  .  Td 01/24/2009  . Tdap 11/06/2019    TDAP status: Up to date  Flu Vaccine status: Up to date  Pneumococcal vaccine status: Up to date  Covid-19 vaccine status: Completed vaccines  Qualifies for Shingles Vaccine? Yes   Zostavax completed No   Shingrix Completed?: No.  Education has been provided regarding the importance of this vaccine. Patient has been advised to call insurance company to determine out of pocket expense if they have not yet received this vaccine. Advised may also receive vaccine at local pharmacy or Health Dept. Verbalized acceptance and understanding.  Screening Tests Health Maintenance  Topic Date Due  . OPHTHALMOLOGY EXAM  12/25/2019  . FOOT EXAM  11/05/2020  . PNA vac Low Risk Adult (2 of 2 - PPSV23) 11/05/2020  . HEMOGLOBIN A1C  05/02/2021  . COLONOSCOPY  01/15/2022  . TETANUS/TDAP  11/05/2029  . INFLUENZA VACCINE  Completed  . COVID-19 Vaccine  Completed  . Hepatitis C Screening  Completed  . HIV Screening  Completed    Health Maintenance  Health Maintenance Due  Topic Date Due  . OPHTHALMOLOGY EXAM  12/25/2019  . FOOT EXAM  11/05/2020  . PNA vac Low Risk Adult (2 of 2 - PPSV23) 11/05/2020    Colorectal cancer screening: Type of screening: Colonoscopy. Completed 01/15/2017. Repeat every 5 years  Lung Cancer Screening: (Low Dose CT Chest recommended if Age 12-80 years, 30 pack-year currently smoking OR have quit w/in 15years.) does not qualify.   Lung Cancer Screening Referral: no  Additional Screening:  Hepatitis C Screening: does qualify; Completed yes  Vision Screening: Recommended annual ophthalmology exams for early detection of glaucoma and other disorders of the eye. Is the patient up to date with their annual eye exam?  No  Who is the provider or what is the name of the office in which the patient attends annual eye exams? N/A If pt is not established with a provider, would they like to be referred to a provider to establish care? No  .   Dental Screening: Recommended annual dental exams for proper oral hygiene  Community Resource Referral / Chronic Care Management: CRR required this visit?  No   CCM required this visit?  No      Plan:     I have personally reviewed and noted the following in the patient's chart:   . Medical and social history . Use of alcohol, tobacco or illicit drugs  . Current medications and supplements . Functional ability and status . Nutritional status . Physical activity . Advanced directives . List of other physicians . Hospitalizations, surgeries, and ER visits in previous 12 months . Vitals . Screenings to include cognitive, depression, and falls . Referrals and appointments  In addition, I have reviewed and discussed with patient certain preventive protocols, quality metrics, and best practice recommendations. A written personalized care plan for preventive services as well as general preventive health recommendations were provided to patient.     Sheral Flow, LPN   [id number]   Nurse Notes:  Patient is cogitatively intact. There were no vitals filed for this visit. There is no height or weight on file to calculate BMI. Patient stated that he has no issues with gait or balance; does not use any assistive devices.

## 2020-12-07 NOTE — Patient Instructions (Signed)
Leon Nelson , Thank you for taking time to come for your Medicare Wellness Visit. I appreciate your ongoing commitment to your health goals. Please review the following plan we discussed and let me know if I can assist you in the future.   Screening recommendations/referrals: Colonoscopy: 01/15/2017; due every 5 years Recommended yearly ophthalmology/optometry visit for glaucoma screening and checkup Recommended yearly dental visit for hygiene and checkup  Vaccinations: Influenza vaccine: 12/04/2020 Pneumococcal vaccine: up to date Tdap vaccine: 11/06/2019; due every 10 years Shingles vaccine: never done   Covid-19: up to date with booster  Advanced directives: Please bring a copy of your health care power of attorney and living will to the office at your convenience.  Conditions/risks identified: Yes; Reviewed health maintenance screenings with patient today and relevant education, vaccines, and/or referrals were provided. Please continue to do your personal lifestyle choices by: daily care of teeth and gums, regular physical activity (goal should be 5 days a week for 30 minutes), eat a healthy diet, avoid tobacco and drug use, limiting any alcohol intake, taking a low-dose aspirin (if not allergic or have been advised by your provider otherwise) and taking vitamins and minerals as recommended by your provider. Continue doing brain stimulating activities (puzzles, reading, adult coloring books, staying active) to keep memory sharp. Continue to eat heart healthy diet (full of fruits, vegetables, whole grains, lean protein, water--limit salt, fat, and sugar intake) and increase physical activity as tolerated.  Next appointment: Please schedule your next Medicare Wellness Visit with your Nurse Health Advisor in 1 year by calling [phone].  Preventive Care 66 Years and Older, Male Preventive care refers to lifestyle choices and visits with your health care provider that can promote health and  wellness. What does preventive care include?  A yearly physical exam. This is also called an annual well check.  Dental exams once or twice a year.  Routine eye exams. Ask your health care provider how often you should have your eyes checked.  Personal lifestyle choices, including:  Daily care of your teeth and gums.  Regular physical activity.  Eating a healthy diet.  Avoiding tobacco and drug use.  Limiting alcohol use.  Practicing safe sex.  Taking low doses of aspirin every day.  Taking vitamin and mineral supplements as recommended by your health care provider. What happens during an annual well check? The services and screenings done by your health care provider during your annual well check will depend on your age, overall health, lifestyle risk factors, and family history of disease. Counseling  Your health care provider may ask you questions about your:  Alcohol use.  Tobacco use.  Drug use.  Emotional well-being.  Home and relationship well-being.  Sexual activity.  Eating habits.  History of falls.  Memory and ability to understand (cognition).  Work and work Statistician. Screening  You may have the following tests or measurements:  Height, weight, and BMI.  Blood pressure.  Lipid and cholesterol levels. These may be checked every 5 years, or more frequently if you are over 66 years old.  Skin check.  Lung cancer screening. You may have this screening every year starting at age 66 if you have a 30-pack-year history of smoking and currently smoke or have quit within the past 15 years.  Fecal occult blood test (FOBT) of the stool. You may have this test every year starting at age 66.  Flexible sigmoidoscopy or colonoscopy. You may have a sigmoidoscopy every 5 years or a colonoscopy every  10 years starting at age 66.  Prostate cancer screening. Recommendations will vary depending on your family history and other risks.  Hepatitis C blood  test.  Hepatitis B blood test.  Sexually transmitted disease (STD) testing.  Diabetes screening. This is done by checking your blood sugar (glucose) after you have not eaten for a while (fasting). You may have this done every 1-3 years.  Abdominal aortic aneurysm (AAA) screening. You may need this if you are a current or former smoker.  Osteoporosis. You may be screened starting at age 66 if you are at high risk. Talk with your health care provider about your test results, treatment options, and if necessary, the need for more tests. Vaccines  Your health care provider may recommend certain vaccines, such as:  Influenza vaccine. This is recommended every year.  Tetanus, diphtheria, and acellular pertussis (Tdap, Td) vaccine. You may need a Td booster every 10 years.  Zoster vaccine. You may need this after age 66.  Pneumococcal 13-valent conjugate (PCV13) vaccine. One dose is recommended after age 66.  Pneumococcal polysaccharide (PPSV23) vaccine. One dose is recommended after age 66. Talk to your health care provider about which screenings and vaccines you need and how often you need them. This information is not intended to replace advice given to you by your health care provider. Make sure you discuss any questions you have with your health care provider. Document Released: 01/06/2016 Document Revised: 08/29/2016 Document Reviewed: 10/11/2015 Elsevier Interactive Patient Education  2017 [place] Prevention in the Home Falls can cause injuries. They can happen to people of all ages. There are many things you can do to make your home safe and to help prevent falls. What can I do on the outside of my home?  Regularly fix the edges of walkways and driveways and fix any cracks.  Remove anything that might make you trip as you walk through a door, such as a raised step or threshold.  Trim any bushes or trees on the path to your home.  Use bright outdoor  lighting.  Clear any walking paths of anything that might make someone trip, such as rocks or tools.  Regularly check to see if handrails are loose or broken. Make sure that both sides of any steps have handrails.  Any raised decks and porches should have guardrails on the edges.  Have any leaves, snow, or ice cleared regularly.  Use sand or salt on walking paths during winter.  Clean up any spills in your garage right away. This includes oil or grease spills. What can I do in the bathroom?  Use night lights.  Install grab bars by the toilet and in the tub and shower. Do not use towel bars as grab bars.  Use non-skid mats or decals in the tub or shower.  If you need to sit down in the shower, use a plastic, non-slip stool.  Keep the floor dry. Clean up any water that spills on the floor as soon as it happens.  Remove soap buildup in the tub or shower regularly.  Attach bath mats securely with double-sided non-slip rug tape.  Do not have throw rugs and other things on the floor that can make you trip. What can I do in the bedroom?  Use night lights.  Make sure that you have a light by your bed that is easy to reach.  Do not use any sheets or blankets that are too big for your bed. They should not  hang down onto the floor.  Have a firm chair that has side arms. You can use this for support while you get dressed.  Do not have throw rugs and other things on the floor that can make you trip. What can I do in the kitchen?  Clean up any spills right away.  Avoid walking on wet floors.  Keep items that you use a lot in easy-to-reach places.  If you need to reach something above you, use a strong step stool that has a grab bar.  Keep electrical cords out of the way.  Do not use floor polish or wax that makes floors slippery. If you must use wax, use non-skid floor wax.  Do not have throw rugs and other things on the floor that can make you trip. What can I do with my  stairs?  Do not leave any items on the stairs.  Make sure that there are handrails on both sides of the stairs and use them. Fix handrails that are broken or loose. Make sure that handrails are as long as the stairways.  Check any carpeting to make sure that it is firmly attached to the stairs. Fix any carpet that is loose or worn.  Avoid having throw rugs at the top or bottom of the stairs. If you do have throw rugs, attach them to the floor with carpet tape.  Make sure that you have a light switch at the top of the stairs and the bottom of the stairs. If you do not have them, ask someone to add them for you. What else can I do to help prevent falls?  Wear shoes that:  Do not have high heels.  Have rubber bottoms.  Are comfortable and fit you well.  Are closed at the toe. Do not wear sandals.  If you use a stepladder:  Make sure that it is fully opened. Do not climb a closed stepladder.  Make sure that both sides of the stepladder are locked into place.  Ask someone to hold it for you, if possible.  Clearly mark and make sure that you can see:  Any grab bars or handrails.  First and last steps.  Where the edge of each step is.  Use tools that help you move around (mobility aids) if they are needed. These include:  Canes.  Walkers.  Scooters.  Crutches.  Turn on the lights when you go into a dark area. Replace any light bulbs as soon as they burn out.  Set up your furniture so you have a clear path. Avoid moving your furniture around.  If any of your floors are uneven, fix them.  If there are any pets around you, be aware of where they are.  Review your medicines with your doctor. Some medicines can make you feel dizzy. This can increase your chance of falling. Ask your doctor what other things that you can do to help prevent falls. This information is not intended to replace advice given to you by your health care provider. Make sure you discuss any  questions you have with your health care provider. Document Released: 10/06/2009 Document Revised: 05/17/2016 Document Reviewed: 01/14/2015 Elsevier Interactive Patient Education  2017 [employer].

## 2020-12-21 ENCOUNTER — Ambulatory Visit: Payer: Medicare Other | Admitting: Podiatry

## 2020-12-21 ENCOUNTER — Encounter: Payer: Self-pay | Admitting: Podiatry

## 2020-12-21 ENCOUNTER — Other Ambulatory Visit: Payer: Self-pay

## 2020-12-21 DIAGNOSIS — B351 Tinea unguium: Secondary | ICD-10-CM

## 2020-12-21 DIAGNOSIS — M79674 Pain in right toe(s): Secondary | ICD-10-CM

## 2020-12-21 DIAGNOSIS — M79675 Pain in left toe(s): Secondary | ICD-10-CM | POA: Diagnosis not present

## 2020-12-21 DIAGNOSIS — M792 Neuralgia and neuritis, unspecified: Secondary | ICD-10-CM | POA: Diagnosis not present

## 2020-12-21 DIAGNOSIS — E1149 Type 2 diabetes mellitus with other diabetic neurological complication: Secondary | ICD-10-CM

## 2020-12-21 NOTE — Progress Notes (Signed)
  Subjective:  Patient ID: Leon Nelson, male    DOB: 1956-07-16,  MRN: [id number]  Chief Complaint  Patient presents with  . routine foot care    Nail trim   64 y.o. male returns for the above complaint.  Patient presents with thickened elongated dystrophic toenails x10.  Patient would like to have them debrided down.  He denies any other acute complaints.  He has been not been able to walk properly because of the pain when ambulating.  He has not been able to take care of himself.  He also has a question about neuropathic pain he would like to know if there is anything that can be done for it.  He is A1c is around 8%.  Objective:  There were no vitals filed for this visit. Podiatric Exam: Vascular: dorsalis pedis and posterior tibial pulses are palpable bilateral. Capillary return is immediate. Temperature gradient is WNL. Skin turgor WNL  Sensorium: Diminished Semmes Weinstein monofilament test. Normal tactile sensation bilaterally. Nail Exam: Pt has thick disfigured discolored nails with subungual debris noted bilateral entire nail hallux through fifth toenails.  Pain on palpation to the nails. Ulcer Exam: There is no evidence of ulcer or pre-ulcerative changes or infection. Orthopedic Exam: Muscle tone and strength are WNL. No limitations in general ROM. No crepitus or effusions noted. HAV  B/L.  Hammer toes 2-5  B/L. Skin: No Porokeratosis. No infection or ulcers    Assessment & Plan:   No diagnosis found.  Patient was evaluated and treated and all questions answered. Neuropathic pain -I explained patient the etiology of neuropathic pain versus treatment options were discussed.  I discussed with the patient that there is neuropathic cream that are available as well as Voltaren gel can help some with the neuropathic pain.  Patient agrees with the plan would like to start to obtain some of those things.  He has been taking gabapentin I told him that he can take it up to 3 times a  day.  He states that he will try to do that.  Also discussed with him that glucose management of sugar control is the best way to bring down neuropathic pain.  Patient states understanding and will do that as well.  Onychomycosis with pain  -Nails palliatively debrided as below. -Educated on self-care  Procedure: Nail Debridement Rationale: pain  Type of Debridement: manual, sharp debridement. Instrumentation: Nail nipper, rotary burr. Number of Nails: 10  Procedures and Treatment: Consent by patient was obtained for treatment procedures. The patient understood the discussion of treatment and procedures well. All questions were answered thoroughly reviewed. Debridement of mycotic and hypertrophic toenails, 1 through 5 bilateral and clearing of subungual debris. No ulceration, no infection noted.  Return Visit-Office Procedure: Patient instructed to return to the office for a follow up visit 3 months for continued evaluation and treatment.  Nicholes Rough, DPM    No follow-ups on file.

## 2021-01-18 ENCOUNTER — Telehealth: Payer: Self-pay | Admitting: Internal Medicine

## 2021-01-18 NOTE — Telephone Encounter (Signed)
   Patient seeking advice for cramping in legs and hands Declined in office appointment for 1/27   Requesting call from [facility]

## 2021-01-18 NOTE — Telephone Encounter (Signed)
Hard to know exact cause, except dehydration can contribute and possible medication like the tenoretic and zocor could possibly be related as well, but also electrolyte issue like low K can as well.   For now, if he could drink plenty of fluids.  That would be good. Also consider OTC Tonic water for having leg cramps at night which can help.  Also a muscle relaxer as needed can sometimes help as well , especially at night while sleeing  So let us know if he would want a muscle relaxer at night, or any other concerns

## 2021-01-19 NOTE — Telephone Encounter (Signed)
Details voicemail left for patient.

## 2021-01-31 ENCOUNTER — Telehealth: Payer: Self-pay | Admitting: Internal Medicine

## 2021-01-31 NOTE — Telephone Encounter (Signed)
Patient dropped off a parking placard to disability.  Form has been completed and Placed in providers box to review and sign if approved.

## 2021-02-01 NOTE — Telephone Encounter (Signed)
Form has been signed, Copy sent to scan.   LVM to inform patient it is ready to be picked up. Also if patient would like it mailed to please call and let me know.

## 2021-02-02 NOTE — Telephone Encounter (Signed)
Patient calling back requesting the form be mailed to him, address on file is correct.

## 2021-02-02 NOTE — Telephone Encounter (Signed)
Forms have been mailed as requested.

## 2021-02-05 ENCOUNTER — Other Ambulatory Visit: Payer: Self-pay | Admitting: Internal Medicine

## 2021-02-05 DIAGNOSIS — I10 Essential (primary) hypertension: Secondary | ICD-10-CM

## 2021-03-22 ENCOUNTER — Other Ambulatory Visit: Payer: Self-pay

## 2021-03-22 ENCOUNTER — Encounter: Payer: Self-pay | Admitting: Podiatry

## 2021-03-22 ENCOUNTER — Ambulatory Visit: Payer: Medicare Other | Admitting: Podiatry

## 2021-03-22 DIAGNOSIS — B351 Tinea unguium: Secondary | ICD-10-CM

## 2021-03-22 DIAGNOSIS — M79675 Pain in left toe(s): Secondary | ICD-10-CM

## 2021-03-22 DIAGNOSIS — E1149 Type 2 diabetes mellitus with other diabetic neurological complication: Secondary | ICD-10-CM

## 2021-03-22 DIAGNOSIS — M79674 Pain in right toe(s): Secondary | ICD-10-CM | POA: Diagnosis not present

## 2021-03-22 NOTE — Progress Notes (Signed)
  Subjective:  Patient ID: Leon Nelson, male    DOB: 07-13-1956,  MRN: [id number]  Chief Complaint  Patient presents with  . Nail Problem    Nail and callus trim    65 y.o. male returns for the above complaint.  Patient presents with thickened elongated dystrophic toenails x10.  Patient would like to have them debrided down.  He denies any other acute complaints.  He has been not been able to walk properly because of the pain when ambulating.  He has not been able to take care of himself.  His A1c is around 8%.  Objective:  There were no vitals filed for this visit. Podiatric Exam: Vascular: dorsalis pedis and posterior tibial pulses are palpable bilateral. Capillary return is immediate. Temperature gradient is WNL. Skin turgor WNL  Sensorium: Diminished Semmes Weinstein monofilament test. Normal tactile sensation bilaterally. Nail Exam: Pt has thick disfigured discolored nails with subungual debris noted bilateral entire nail hallux through fifth toenails.  Pain on palpation to the nails. Ulcer Exam: There is no evidence of ulcer or pre-ulcerative changes or infection. Orthopedic Exam: Muscle tone and strength are WNL. No limitations in general ROM. No crepitus or effusions noted. HAV  B/L.  Hammer toes 2-5  B/L. Skin: No Porokeratosis. No infection or ulcers    Assessment & Plan:   No diagnosis found.  Patient was evaluated and treated and all questions answered. Neuropathic pain -I explained patient the etiology of neuropathic pain versus treatment options were discussed.  I discussed with the patient that there is neuropathic cream that are available as well as Voltaren gel can help some with the neuropathic pain.  Patient agrees with the plan would like to start to obtain some of those things.  He has been taking gabapentin I told him that he can take it up to 3 times a day.  He states that he will try to do that.  Also discussed with him that glucose management of sugar control  is the best way to bring down neuropathic pain.  Patient states understanding and will do that as well.  Onychomycosis with pain  -Nails palliatively debrided as below. -Educated on self-care  Procedure: Nail Debridement Rationale: pain  Type of Debridement: manual, sharp debridement. Instrumentation: Nail nipper, rotary burr. Number of Nails: 10  Procedures and Treatment: Consent by patient was obtained for treatment procedures. The patient understood the discussion of treatment and procedures well. All questions were answered thoroughly reviewed. Debridement of mycotic and hypertrophic toenails, 1 through 5 bilateral and clearing of subungual debris. No ulceration, no infection noted.  Return Visit-Office Procedure: Patient instructed to return to the office for a follow up visit 3 months for continued evaluation and treatment.  Boneta Lucks, DPM    No follow-ups on file.

## 2021-04-20 ENCOUNTER — Telehealth: Payer: Self-pay | Admitting: Internal Medicine

## 2021-04-20 NOTE — Telephone Encounter (Signed)
[facility] FYI   Caller states he began to experiencing right sided pain. Greasy stools noted. Stool changes. When stools are loose or soft, stool appears greasy. Increase in flatus voiced. Abdominal discomfort started over weekend. Has hx/o IBS. Takes dicyclomine 20mg  for IBS. No n/v.  Advised to do Home care.  * You should be able to treat this at home. REASSURANCE AND EDUCATION - STOMACH PAIN: * It doesn't sound like a serious stomachache. CALL BACK IF: * Severe pain present over 1 hour * Constant pain present over 2 hours * Intermittent pain lasts over 48 hours * You become worse AVOID ASPIRIN AND NSAIDS: * These drugs can irritate the stomach lining and make the pain worse. * Avoid taking aspirin and anti-inflammatory medicines (i.e., NSAIDS like ibuprofen/Motrin, naproxen/Aleve) unless you have been told to do so by a doctor (or NP/PA).

## 2021-04-20 NOTE — Telephone Encounter (Signed)
    Patient calling to report abdominal pain and "greasy stool"  Call transferred to [facility]

## 2021-04-24 ENCOUNTER — Encounter: Payer: Self-pay | Admitting: Internal Medicine

## 2021-04-24 ENCOUNTER — Other Ambulatory Visit: Payer: Self-pay | Admitting: Internal Medicine

## 2021-04-24 ENCOUNTER — Ambulatory Visit (INDEPENDENT_AMBULATORY_CARE_PROVIDER_SITE_OTHER): Payer: Medicare Other | Admitting: Internal Medicine

## 2021-04-24 ENCOUNTER — Telehealth: Payer: Self-pay | Admitting: Internal Medicine

## 2021-04-24 ENCOUNTER — Ambulatory Visit
Admission: RE | Admit: 2021-04-24 | Discharge: 2021-04-24 | Disposition: A | Discharge status: Home / Self Care | Payer: Medicare Other | Source: Ambulatory Visit | Attending: Internal Medicine | Admitting: Internal Medicine

## 2021-04-24 ENCOUNTER — Other Ambulatory Visit: Payer: Self-pay

## 2021-04-24 VITALS — BP 128/70 | HR 60 | Temp 98.9°F | Ht 72.0 in | Wt 294.0 lb

## 2021-04-24 DIAGNOSIS — E1149 Type 2 diabetes mellitus with other diabetic neurological complication: Secondary | ICD-10-CM

## 2021-04-24 DIAGNOSIS — Z23 Encounter for immunization: Secondary | ICD-10-CM

## 2021-04-24 DIAGNOSIS — I10 Essential (primary) hypertension: Secondary | ICD-10-CM

## 2021-04-24 DIAGNOSIS — R1011 Right upper quadrant pain: Secondary | ICD-10-CM | POA: Diagnosis not present

## 2021-04-24 DIAGNOSIS — E559 Vitamin D deficiency, unspecified: Secondary | ICD-10-CM | POA: Diagnosis not present

## 2021-04-24 DIAGNOSIS — E782 Mixed hyperlipidemia: Secondary | ICD-10-CM

## 2021-04-24 DIAGNOSIS — R109 Unspecified abdominal pain: Secondary | ICD-10-CM | POA: Diagnosis not present

## 2021-04-24 DIAGNOSIS — E539 Vitamin B deficiency, unspecified: Secondary | ICD-10-CM | POA: Insufficient documentation

## 2021-04-24 DIAGNOSIS — E039 Hypothyroidism, unspecified: Secondary | ICD-10-CM | POA: Diagnosis not present

## 2021-04-24 DIAGNOSIS — Z0001 Encounter for general adult medical examination with abnormal findings: Secondary | ICD-10-CM

## 2021-04-24 DIAGNOSIS — E538 Deficiency of other specified B group vitamins: Secondary | ICD-10-CM | POA: Insufficient documentation

## 2021-04-24 LAB — CBC WITH DIFFERENTIAL/PLATELET
Basophils Absolute: 0.1 10*3/uL (ref 0.0–0.1)
Basophils Relative: 0.6 % (ref 0.0–3.0)
Eosinophils Absolute: 0.6 10*3/uL (ref 0.0–0.7)
Eosinophils Relative: 6.4 % — ABNORMAL HIGH (ref 0.0–5.0)
HCT: 41.8 % (ref 39.0–52.0)
Hemoglobin: 14 g/dL (ref 13.0–17.0)
Lymphocytes Relative: 28.8 % (ref 12.0–46.0)
Lymphs Abs: 2.9 10*3/uL (ref 0.7–4.0)
MCHC: 33.5 g/dL (ref 30.0–36.0)
MCV: 88.9 fl (ref 78.0–100.0)
Monocytes Absolute: 0.8 10*3/uL (ref 0.1–1.0)
Monocytes Relative: 7.7 % (ref 3.0–12.0)
Neutro Abs: 5.6 10*3/uL (ref 1.4–7.7)
Neutrophils Relative %: 56.5 % (ref 43.0–77.0)
Platelets: 235 10*3/uL (ref 150.0–400.0)
RBC: 4.7 Mil/uL (ref 4.22–5.81)
RDW: 13.7 % (ref 11.5–15.5)
WBC: 9.9 10*3/uL (ref 4.0–10.5)

## 2021-04-24 LAB — URINALYSIS, ROUTINE W REFLEX MICROSCOPIC
Bilirubin Urine: NEGATIVE
Hgb urine dipstick: NEGATIVE
Ketones, ur: NEGATIVE
Leukocytes,Ua: NEGATIVE
Nitrite: NEGATIVE
RBC / HPF: NONE SEEN (ref 0–?)
Specific Gravity, Urine: 1.025 (ref 1.000–1.030)
Total Protein, Urine: NEGATIVE
Urine Glucose: NEGATIVE
Urobilinogen, UA: 0.2 (ref 0.0–1.0)
pH: 5.5 (ref 5.0–8.0)

## 2021-04-24 LAB — HEPATIC FUNCTION PANEL
ALT: 23 U/L (ref 0–53)
AST: 17 U/L (ref 0–37)
Albumin: 3.9 g/dL (ref 3.5–5.2)
Alkaline Phosphatase: 43 U/L (ref 39–117)
Bilirubin, Direct: 0.1 mg/dL (ref 0.0–0.3)
Total Bilirubin: 0.5 mg/dL (ref 0.2–1.2)
Total Protein: 6.3 g/dL (ref 6.0–8.3)

## 2021-04-24 LAB — BASIC METABOLIC PANEL
BUN: 15 mg/dL (ref 6–23)
CO2: 29 mEq/L (ref 19–32)
Calcium: 8.9 mg/dL (ref 8.4–10.5)
Chloride: 104 mEq/L (ref 96–112)
Creatinine, Ser: 0.73 mg/dL (ref 0.40–1.50)
GFR: 94.9 mL/min (ref >60.00)
Glucose, Bld: 152 mg/dL — ABNORMAL HIGH (ref 70–99)
Potassium: 4.3 mEq/L (ref 3.5–5.1)
Sodium: 143 mEq/L (ref 135–145)

## 2021-04-24 LAB — LIPID PANEL
Cholesterol: 129 mg/dL (ref 0–200)
HDL: 35.3 mg/dL — ABNORMAL LOW (ref >39.00)
LDL Cholesterol: 70 mg/dL (ref 0–99)
NonHDL: 93.57
Total CHOL/HDL Ratio: 4
Triglycerides: 117 mg/dL (ref 0.0–149.0)
VLDL: 23.4 mg/dL (ref 0.0–40.0)

## 2021-04-24 LAB — HEMOGLOBIN A1C: Hgb A1c MFr Bld: 6.7 % — ABNORMAL HIGH (ref 4.6–6.5)

## 2021-04-24 LAB — MICROALBUMIN / CREATININE URINE RATIO
Creatinine,U: 123.5 mg/dL
Microalb Creat Ratio: 0.7 mg/g (ref 0.0–30.0)
Microalb, Ur: 0.8 mg/dL (ref 0.0–1.9)

## 2021-04-24 LAB — TSH: TSH: 2.34 u[IU]/mL (ref 0.35–4.50)

## 2021-04-24 LAB — VITAMIN D 25 HYDROXY (VIT D DEFICIENCY, FRACTURES): VITD: 37.14 ng/mL (ref 30.00–100.00)

## 2021-04-24 LAB — PSA: PSA: 0.08 ng/mL — ABNORMAL LOW (ref 0.10–4.00)

## 2021-04-24 LAB — VITAMIN B12: Vitamin B-12: 1365 pg/mL — ABNORMAL HIGH (ref 211–911)

## 2021-04-24 LAB — LIPASE: Lipase: 10 U/L — ABNORMAL LOW (ref 11.0–59.0)

## 2021-04-24 NOTE — Telephone Encounter (Signed)
Called twice and left one voicemail

## 2021-04-24 NOTE — Assessment & Plan Note (Signed)
Lab Results  Component Value Date   LDLCALC 51 11/02/2020   Stable, pt to continue current statin zocor

## 2021-04-24 NOTE — Telephone Encounter (Signed)
Called transfer [facility] want to change to w/o contrast Dr. Jenny Reichmann stated it was fine.

## 2021-04-24 NOTE — Progress Notes (Signed)
Patient ID: Leon Nelson, male   DOB: March 07, 1956, 65 y.o.   MRN: [id number]         Chief Complaint:: wellness exam and Diarrhea (Pt state his bowel movement was green and runny after eating greens. Pt states last last til now he is having right side flank pain.)  and dm, htn, hld, hypothyroidism       HPI:  Leon Nelson is a 65 y.o. male here for wellness exam; due for pneumovax, has optho appt in June 2022 o/w up to date with preventive referrals and immunzations.                        Also c/o 3 days onset persistent mild RUQ /side/flank pain dull and uncomfortable, constant, not better or worse with anything and not sore to touch in the area.  No hx of renal stones but is wondering about this, though most of the discomfort seems to be located more in the front and wraps around to the back.  Also c/o cylcical change in bowel habits for the last 3 mo where he would have constipation, then normal then loose and greasy stools, seems to take 4-6 days then just happens the same thing again cycling around and around with plenty of gas as well, but  without fever, n/v, blood.  He was not aware but has lost wt recent from 306 to 294 as well. Not taking the Vit D or B12 currently.     Wt Readings from Last 3 Encounters:  04/24/21 294 lb (133.4 kg)  11/09/20 (!) 306 lb (138.8 kg)  06/22/20 (!) 312 lb (141.5 kg)   BP Readings from Last 3 Encounters:  04/24/21 128/70  11/09/20 110/70  06/22/20 140/66   Immunization History  Administered Date(s) Administered  . Fluad Quad(high Dose 65+) 12/04/2020  . H1N1 01/24/2009  . Influenza Split 09/21/2011  . Influenza Whole 09/23/2008, 09/23/2009  . Influenza, Seasonal, Injecte, Preservative Fre 02/11/2013  . Influenza,inj,Quad PF,6+ Mos 01/20/2014, 09/16/2015, 11/06/2019  . Influenza-Unspecified 09/22/2016, 10/20/2018  . PFIZER(Purple Top)SARS-COV-2 Vaccination 01/25/2020, 02/15/2020, 12/04/2020  . Pneumococcal Conjugate-13 11/06/2019  .  Pneumococcal Polysaccharide-23 12/24/2005, 07/21/2014, 04/24/2021  . Td 01/24/2009  . Tdap 11/06/2019   Health Maintenance Due  Topic Date Due  . OPHTHALMOLOGY EXAM  12/25/2019      Past Medical History:  Diagnosis Date  . ABDOMINAL PAIN OTHER SPECIFIED SITE 07/20/2008  . CHEST PAIN 02/06/2011  . COLONIC POLYPS, HX OF 05/26/2008  . DEPRESSION 05/26/2008  . DIABETES MELLITUS, TYPE II 05/26/2008  . HYPERLIPIDEMIA 05/26/2008  . HYPERTENSION 05/26/2008  . HYPOTHYROIDISM 05/26/2008  . KNEE PAIN, RIGHT, ACUTE 07/01/2008  . Personal history of urinary calculi 05/26/2008  . UTI'S, HX OF 05/26/2008   Past Surgical History:  Procedure Laterality Date  . s/p right knee arthroplasty      complicated by patellar tendon rupture Jan 2011 Dr. Gladstone Lighter  . TONSILLECTOMY      reports that he has never smoked. He has never used smokeless tobacco. He reports that he does not drink alcohol and does not use drugs. family history includes Alcohol abuse in an other family member; Cancer in his mother; Diabetes in an other family member; Heart disease in his father and mother; Hyperlipidemia in his father and mother. Allergies  Allergen Reactions  . Penicillins Other (See Comments)    Blisters on hands  . Sulfonamide Derivatives Other (See Comments)    Blisters in groin area  Current Outpatient Medications on File Prior to Visit  Medication Sig Dispense Refill  . allopurinol (ZYLOPRIM) 300 MG tablet TAKE 1 TABLET BY MOUTH  DAILY 90 tablet 3  . atenolol-chlorthalidone (TENORETIC) 50-25 MG tablet TAKE ONE-HALF TABLET BY  MOUTH DAILY 45 tablet 2  . Blood Glucose Monitoring Suppl (ONE TOUCH ULTRA 2) w/Device KIT Use as directed once daily E11.9 1 each 0  . diazepam (VALIUM) 5 MG tablet 1 tab by mouth at 45 min prior to procedure 1 tablet 0  . diclofenac sodium (VOLTAREN) 1 % GEL Apply 2 g topically 4 (four) times daily as needed. 200 g 5  . dicyclomine (BENTYL) 20 MG tablet TAKE 1 TABLET BY MOUTH 3  TIMES DAILY AS  NEEDED 180 tablet 2  . gabapentin (NEURONTIN) 300 MG capsule TAKE 1 CAPSULE BY MOUTH  TWICE DAILY 180 capsule 3  . glipiZIDE (GLUCOTROL XL) 10 MG 24 hr tablet TAKE 1 TABLET BY MOUTH  DAILY WITH BREAKFAST 90 tablet 3  . glucose blood (ONE TOUCH ULTRA TEST) test strip Use as instructed once daily E11.9 100 each 12  . Lancets MISC Use as directed once daily E11.9 100 each 3  . levothyroxine (SYNTHROID) 50 MCG tablet TAKE 1 TABLET BY MOUTH  DAILY 90 tablet 3  . lisinopril (ZESTRIL) 20 MG tablet TAKE 1 TABLET BY MOUTH  DAILY 90 tablet 3  . metFORMIN (GLUCOPHAGE) 1000 MG tablet TAKE 1 TABLET BY MOUTH  TWICE DAILY WITH MEALS 180 tablet 3  . potassium chloride SA (KLOR-CON M20) 20 MEQ tablet Take 2 tablets (40 mEq total) by mouth daily. 180 tablet 2  . simvastatin (ZOCOR) 80 MG tablet TAKE 1 TABLET BY MOUTH AT  BEDTIME 90 tablet 3  . venlafaxine XR (EFFEXOR-XR) 75 MG 24 hr capsule TAKE 1 CAPSULE BY MOUTH  DAILY 90 capsule 3  . vitamin B-12 (CYANOCOBALAMIN) 1000 MCG tablet Take 1 tablet (1,000 mcg total) by mouth daily. 90 tablet 3   No current facility-administered medications on file prior to visit.        ROS:  All others reviewed and negative.  Objective        PE:  BP 128/70 (BP Location: Right Arm, Patient Position: Sitting, Cuff Size: Normal)   Pulse 60   Temp 98.9 F (37.2 C) (Oral)   Ht 6' (1.829 m)   Wt 294 lb (133.4 kg)   SpO2 98%   BMI 39.87 kg/m                 Constitutional: Pt appears in NAD               HENT: Head: NCAT.                Right Ear: External ear normal.                 Left Ear: External ear normal.                Eyes: . Pupils are equal, round, and reactive to light. Conjunctivae and EOM are normal               Nose: without d/c or deformity               Neck: Neck supple. Gross normal ROM               Cardiovascular: Normal rate and regular rhythm.  Pulmonary/Chest: Effort normal and breath sounds without rales or wheezing.                 Abd:  Soft, NT, ND, + BS, no organomegaly               Neurological: Pt is alert. At baseline orientation, motor grossly intact               Skin: Skin is warm. No rashes, no other new lesions, LE edema - none              Psychiatric: Pt behavior is normal without agitation   Micro: none  Cardiac tracings I have personally interpreted today:  none  Pertinent Radiological findings (summarize): none   Lab Results  Component Value Date   WBC 9.9 04/24/2021   HGB 14.0 04/24/2021   HCT 41.8 04/24/2021   PLT 235.0 04/24/2021   GLUCOSE 152 (H) 04/24/2021   CHOL 129 04/24/2021   TRIG 117.0 04/24/2021   HDL 35.30 (L) 04/24/2021   LDLDIRECT 65.0 06/04/2019   LDLCALC 70 04/24/2021   ALT 23 04/24/2021   AST 17 04/24/2021   NA 143 04/24/2021   K 4.3 04/24/2021   CL 104 04/24/2021   CREATININE 0.73 04/24/2021   BUN 15 04/24/2021   CO2 29 04/24/2021   TSH 2.34 04/24/2021   PSA 0.08 (L) 04/24/2021   INR 2.0 ratio (H) 01/16/2010   HGBA1C 6.7 (H) 04/24/2021   MICROALBUR 0.8 04/24/2021   Assessment/Plan:  Leon Nelson is a 65 y.o. White or Caucasian [1] male with  has a past medical history of ABDOMINAL PAIN OTHER SPECIFIED SITE (07/20/2008), CHEST PAIN (02/06/2011), COLONIC POLYPS, HX OF (05/26/2008), DEPRESSION (05/26/2008), DIABETES MELLITUS, TYPE II (05/26/2008), HYPERLIPIDEMIA (05/26/2008), HYPERTENSION (05/26/2008), HYPOTHYROIDISM (05/26/2008), KNEE PAIN, RIGHT, ACUTE (07/01/2008), Personal history of urinary calculi (05/26/2008), and UTI'S, HX OF (05/26/2008).  Encounter for well adult exam with abnormal findings Age and sex appropriate education and counseling updated with regular exercise and diet Referrals for preventative services - none needed - has eye appt for June 2022 Immunizations addressed - for pneumovax today Smoking counseling  - none needed Evidence for depression or other mood disorder - none significant Most recent labs reviewed. I have personally reviewed and have noted: 1)  the patient's medical and social history 2) The patient's current medications and supplements 3) The patient's height, weight, and BMI have been recorded in the chart   Vitamin D deficiency Last vitamin D Lab Results  Component Value Date   VD25OH 19.62 (L) 05/11/2020   Low to start oral replacement   RUQ pain Etiology unclear, for labs as ordered, but also CT scan, and work note given  Hypothyroidism Lab Results  Component Value Date   TSH 3.14 05/11/2020   Stable, pt to continue levothyroxine  Current Outpatient Medications (Endocrine & Metabolic):  .  glipiZIDE (GLUCOTROL XL) 10 MG 24 hr tablet, TAKE 1 TABLET BY MOUTH  DAILY WITH BREAKFAST .  levothyroxine (SYNTHROID) 50 MCG tablet, TAKE 1 TABLET BY MOUTH  DAILY .  metFORMIN (GLUCOPHAGE) 1000 MG tablet, TAKE 1 TABLET BY MOUTH  TWICE DAILY WITH MEALS  Current Outpatient Medications (Cardiovascular):  .  atenolol-chlorthalidone (TENORETIC) 50-25 MG tablet, TAKE ONE-HALF TABLET BY  MOUTH DAILY .  lisinopril (ZESTRIL) 20 MG tablet, TAKE 1 TABLET BY MOUTH  DAILY .  simvastatin (ZOCOR) 80 MG tablet, TAKE 1 TABLET BY MOUTH AT  BEDTIME   Current Outpatient Medications (Analgesics):  .  allopurinol (ZYLOPRIM) 300 MG tablet, TAKE 1 TABLET BY MOUTH  DAILY  Current Outpatient Medications (Hematological):  .  vitamin B-12 (CYANOCOBALAMIN) 1000 MCG tablet, Take 1 tablet (1,000 mcg total) by mouth daily.  Current Outpatient Medications (Other):  .  Blood Glucose Monitoring Suppl (ONE TOUCH ULTRA 2) w/Device KIT, Use as directed once daily E11.9 .  diazepam (VALIUM) 5 MG tablet, 1 tab by mouth at 45 min prior to procedure .  diclofenac sodium (VOLTAREN) 1 % GEL, Apply 2 g topically 4 (four) times daily as needed. .  dicyclomine (BENTYL) 20 MG tablet, TAKE 1 TABLET BY MOUTH 3  TIMES DAILY AS NEEDED .  gabapentin (NEURONTIN) 300 MG capsule, TAKE 1 CAPSULE BY MOUTH  TWICE DAILY .  glucose blood (ONE TOUCH ULTRA TEST) test strip, Use as  instructed once daily E11.9 .  Lancets MISC, Use as directed once daily E11.9 .  potassium chloride SA (KLOR-CON M20) 20 MEQ tablet, Take 2 tablets (40 mEq total) by mouth daily. Marland Kitchen  venlafaxine XR (EFFEXOR-XR) 75 MG 24 hr capsule, TAKE 1 CAPSULE BY MOUTH  DAILY   Hyperlipidemia Lab Results  Component Value Date   LDLCALC 51 11/02/2020   Stable, pt to continue current statin zocor    Essential hypertension BP Readings from Last 3 Encounters:  04/24/21 128/70  11/09/20 110/70  06/22/20 140/66   Stable, pt to continue medical treatment tenoretic, zestril   Diabetes mellitus type 2 with neurological manifestations ([place]) Stable by hx, for a1c with labs,  to f/u any worsening symptoms or concerns  B12 deficiency Lab Results  Component Value Date   VITAMINB12 206 (L) 05/11/2020   Low, to start oral replacement - b12 1000 mcg qd   Followup: Return in about 16 days (around 05/10/2021).  [name], MD 05/01/2021 11:02 PM [facility]

## 2021-04-24 NOTE — Assessment & Plan Note (Signed)
BP Readings from Last 3 Encounters:  04/24/21 128/70  11/09/20 110/70  06/22/20 140/66   Stable, pt to continue medical treatment tenoretic, zestril

## 2021-04-24 NOTE — Assessment & Plan Note (Signed)
Age and sex appropriate education and counseling updated with regular exercise and diet Referrals for preventative services - none needed - has eye appt for June 2022 Immunizations addressed - for pneumovax today Smoking counseling  - none needed Evidence for depression or other mood disorder - none significant Most recent labs reviewed. I have personally reviewed and have noted: 1) the patient's medical and social history 2) The patient's current medications and supplements 3) The patient's height, weight, and BMI have been recorded in the chart

## 2021-04-24 NOTE — Assessment & Plan Note (Signed)
Lab Results  Component Value Date   VITAMINB12 206 (L) 05/11/2020   Low, to start oral replacement - b12 1000 mcg qd

## 2021-04-24 NOTE — Assessment & Plan Note (Signed)
Stable by hx, for a1c with labs,  to f/u any worsening symptoms or concerns

## 2021-04-24 NOTE — Assessment & Plan Note (Signed)
Etiology unclear, for labs as ordered, but also CT scan, and work note given

## 2021-04-24 NOTE — Patient Instructions (Addendum)
You had the pneumovax pneumonia shot today  Please remember to keep your eye doctor appt in June  Please check with insurance about the Shingrix shot coverage  Please continue all other medications as before, and refills have been done if requested.  Please have the pharmacy call with any other refills you may need.  Please continue your efforts at being more active, low cholesterol diet, and weight control.  You are otherwise up to date with prevention measures today.  Please keep your appointments with your specialists as you may have planned  You will be contacted regarding the referral for: CT scan  Please go to the LAB at the blood drawing area for the tests to be done  You will be contacted by phone if any changes need to be made immediately.  Otherwise, you will receive a letter about your results with an explanation, but please check with MyChart first.  Please remember to sign up for MyChart if you have not done so, as this will be important to you in the future with finding out test results, communicating by private email, and scheduling acute appointments online when needed.  Please also keep your appt on May 10, 2021 to followup

## 2021-04-24 NOTE — Assessment & Plan Note (Signed)
Last vitamin D Lab Results  Component Value Date   VD25OH 19.62 (L) 05/11/2020   Low to start oral replacement

## 2021-04-24 NOTE — Telephone Encounter (Signed)
[facility] called and is requesting a call back in regards to CT pelvis and abdomin. She said that it needs to be with out contrast. She can be reached at [phone] ext [phone]. Please advise

## 2021-04-24 NOTE — Assessment & Plan Note (Signed)
Lab Results  Component Value Date   TSH 3.14 05/11/2020   Stable, pt to continue levothyroxine  Current Outpatient Medications (Endocrine & Metabolic):  .  glipiZIDE (GLUCOTROL XL) 10 MG 24 hr tablet, TAKE 1 TABLET BY MOUTH  DAILY WITH BREAKFAST .  levothyroxine (SYNTHROID) 50 MCG tablet, TAKE 1 TABLET BY MOUTH  DAILY .  metFORMIN (GLUCOPHAGE) 1000 MG tablet, TAKE 1 TABLET BY MOUTH  TWICE DAILY WITH MEALS  Current Outpatient Medications (Cardiovascular):  .  atenolol-chlorthalidone (TENORETIC) 50-25 MG tablet, TAKE ONE-HALF TABLET BY  MOUTH DAILY .  lisinopril (ZESTRIL) 20 MG tablet, TAKE 1 TABLET BY MOUTH  DAILY .  simvastatin (ZOCOR) 80 MG tablet, TAKE 1 TABLET BY MOUTH AT  BEDTIME   Current Outpatient Medications (Analgesics):  .  allopurinol (ZYLOPRIM) 300 MG tablet, TAKE 1 TABLET BY MOUTH  DAILY  Current Outpatient Medications (Hematological):  .  vitamin B-12 (CYANOCOBALAMIN) 1000 MCG tablet, Take 1 tablet (1,000 mcg total) by mouth daily.  Current Outpatient Medications (Other):  .  Blood Glucose Monitoring Suppl (ONE TOUCH ULTRA 2) w/Device KIT, Use as directed once daily E11.9 .  diazepam (VALIUM) 5 MG tablet, 1 tab by mouth at 45 min prior to procedure .  diclofenac sodium (VOLTAREN) 1 % GEL, Apply 2 g topically 4 (four) times daily as needed. .  dicyclomine (BENTYL) 20 MG tablet, TAKE 1 TABLET BY MOUTH 3  TIMES DAILY AS NEEDED .  gabapentin (NEURONTIN) 300 MG capsule, TAKE 1 CAPSULE BY MOUTH  TWICE DAILY .  glucose blood (ONE TOUCH ULTRA TEST) test strip, Use as instructed once daily E11.9 .  Lancets MISC, Use as directed once daily E11.9 .  potassium chloride SA (KLOR-CON M20) 20 MEQ tablet, Take 2 tablets (40 mEq total) by mouth daily. Marland Kitchen  venlafaxine XR (EFFEXOR-XR) 75 MG 24 hr capsule, TAKE 1 CAPSULE BY MOUTH  DAILY

## 2021-05-01 ENCOUNTER — Encounter: Payer: Self-pay | Admitting: Internal Medicine

## 2021-05-02 ENCOUNTER — Other Ambulatory Visit: Payer: Self-pay | Admitting: Internal Medicine

## 2021-05-02 ENCOUNTER — Ambulatory Visit
Admission: RE | Admit: 2021-05-02 | Discharge: 2021-05-02 | Disposition: A | Discharge status: Home / Self Care | Payer: Medicare Other | Source: Ambulatory Visit | Attending: Internal Medicine | Admitting: Internal Medicine

## 2021-05-02 ENCOUNTER — Encounter: Payer: Self-pay | Admitting: Internal Medicine

## 2021-05-02 DIAGNOSIS — R1011 Right upper quadrant pain: Secondary | ICD-10-CM

## 2021-05-02 DIAGNOSIS — K769 Liver disease, unspecified: Secondary | ICD-10-CM

## 2021-05-03 ENCOUNTER — Other Ambulatory Visit: Payer: Medicare Other

## 2021-05-05 ENCOUNTER — Telehealth: Payer: Self-pay | Admitting: Internal Medicine

## 2021-05-05 NOTE — Telephone Encounter (Signed)
Ok to try otc immodium ad if not already tried

## 2021-05-05 NOTE — Telephone Encounter (Signed)
   Spouse calling on behalf of patient ,requesting medication be prescribed for diarrhea   Phone [phone]

## 2021-05-05 NOTE — Telephone Encounter (Signed)
Mrs. Leon Nelson notified for mr Leon Nelson to take the otc medication

## 2021-05-10 ENCOUNTER — Ambulatory Visit (INDEPENDENT_AMBULATORY_CARE_PROVIDER_SITE_OTHER): Payer: Medicare Other | Admitting: Internal Medicine

## 2021-05-10 ENCOUNTER — Encounter: Payer: Self-pay | Admitting: Internal Medicine

## 2021-05-10 ENCOUNTER — Other Ambulatory Visit: Payer: Self-pay

## 2021-05-10 VITALS — BP 112/70 | HR 58 | Temp 98.1°F | Ht 72.0 in | Wt 292.0 lb

## 2021-05-10 DIAGNOSIS — E559 Vitamin D deficiency, unspecified: Secondary | ICD-10-CM | POA: Diagnosis not present

## 2021-05-10 DIAGNOSIS — E538 Deficiency of other specified B group vitamins: Secondary | ICD-10-CM | POA: Diagnosis not present

## 2021-05-10 DIAGNOSIS — R1011 Right upper quadrant pain: Secondary | ICD-10-CM

## 2021-05-10 DIAGNOSIS — E1149 Type 2 diabetes mellitus with other diabetic neurological complication: Secondary | ICD-10-CM

## 2021-05-10 NOTE — Progress Notes (Signed)
Patient ID: Leon Nelson, male   DOB: 03/12/1956, 65 y.o.   MRN: [id number]         Chief Complaint:: wellness exam and        HPI:  Leon Nelson is a 65 y.o. male here for wellness exam; has eye exam due in jute 2022. Ow up to date with preventive referrals and immunizations                         Also has persistent vague ruq discomfort no change, has known left liver lesion, MRI has been ordered, and GI referred.  Pt denies chest pain, increased sob or doe, wheezing, orthopnea, PND, increased LE swelling, palpitations, dizziness or syncope.   Pt denies polydipsia, polyuria, or new focal neuro s/s.  Taking B12 daily.  Taking Vit D.   Pt denies fever, wt loss, night sweats, loss of appetite, or other constitutional symptoms  Denies worsening reflux, abd pain, dysphagia, n/v, bowel change or blood.   Pt denies fever, night sweats, loss of appetite, or other constitutional symptoms except for several lbs wt loss over the last 6 mo.    Wt Readings from Last 3 Encounters:  05/10/21 292 lb (132.5 kg)  04/24/21 294 lb (133.4 kg)  11/09/20 (!) 306 lb (138.8 kg)   BP Readings from Last 3 Encounters:  05/10/21 112/70  04/24/21 128/70  11/09/20 110/70   Immunization History  Administered Date(s) Administered  . Fluad Quad(high Dose 65+) 12/04/2020  . H1N1 01/24/2009  . Influenza Split 09/21/2011  . Influenza Whole 09/23/2008, 09/23/2009  . Influenza, Seasonal, Injecte, Preservative Fre 02/11/2013  . Influenza,inj,Quad PF,6+ Mos 01/20/2014, 09/16/2015, 11/06/2019  . Influenza-Unspecified 09/22/2016, 10/20/2018  . PFIZER(Purple Top)SARS-COV-2 Vaccination 01/25/2020, 02/15/2020, 12/04/2020  . Pneumococcal Conjugate-13 11/06/2019  . Pneumococcal Polysaccharide-23 12/24/2005, 07/21/2014, 04/24/2021  . Td 01/24/2009  . Tdap 11/06/2019  There are no preventive care reminders to display for this patient.    Past Medical History:  Diagnosis Date  . ABDOMINAL PAIN OTHER SPECIFIED SITE  07/20/2008  . CHEST PAIN 02/06/2011  . COLONIC POLYPS, HX OF 05/26/2008  . DEPRESSION 05/26/2008  . DIABETES MELLITUS, TYPE II 05/26/2008  . HYPERLIPIDEMIA 05/26/2008  . HYPERTENSION 05/26/2008  . HYPOTHYROIDISM 05/26/2008  . KNEE PAIN, RIGHT, ACUTE 07/01/2008  . Personal history of urinary calculi 05/26/2008  . UTI'S, HX OF 05/26/2008   Past Surgical History:  Procedure Laterality Date  . s/p right knee arthroplasty      complicated by patellar tendon rupture Jan 2011 Dr. Gladstone Lighter  . TONSILLECTOMY      reports that he has never smoked. He has never used smokeless tobacco. He reports that he does not drink alcohol and does not use drugs. family history includes Alcohol abuse in an other family member; Cancer in his mother; Diabetes in an other family member; Heart disease in his father and mother; Hyperlipidemia in his father and mother. Allergies  Allergen Reactions  . Penicillins Other (See Comments)    Blisters on hands  . Sulfonamide Derivatives Other (See Comments)    Blisters in groin area   Current Outpatient Medications on File Prior to Visit  Medication Sig Dispense Refill  . allopurinol (ZYLOPRIM) 300 MG tablet TAKE 1 TABLET BY MOUTH  DAILY 90 tablet 3  . atenolol-chlorthalidone (TENORETIC) 50-25 MG tablet TAKE ONE-HALF TABLET BY  MOUTH DAILY 45 tablet 2  . Blood Glucose Monitoring Suppl (ONE TOUCH ULTRA 2) w/Device KIT Use as directed  once daily E11.9 1 each 0  . diazepam (VALIUM) 5 MG tablet 1 tab by mouth at 45 min prior to procedure 1 tablet 0  . diclofenac sodium (VOLTAREN) 1 % GEL Apply 2 g topically 4 (four) times daily as needed. 200 g 5  . gabapentin (NEURONTIN) 300 MG capsule TAKE 1 CAPSULE BY MOUTH  TWICE DAILY 180 capsule 3  . glipiZIDE (GLUCOTROL XL) 10 MG 24 hr tablet TAKE 1 TABLET BY MOUTH  DAILY WITH BREAKFAST 90 tablet 3  . glucose blood (ONE TOUCH ULTRA TEST) test strip Use as instructed once daily E11.9 100 each 12  . Lancets MISC Use as directed once daily E11.9 100 each  3  . levothyroxine (SYNTHROID) 50 MCG tablet TAKE 1 TABLET BY MOUTH  DAILY 90 tablet 3  . lisinopril (ZESTRIL) 20 MG tablet TAKE 1 TABLET BY MOUTH  DAILY 90 tablet 3  . metFORMIN (GLUCOPHAGE) 1000 MG tablet TAKE 1 TABLET BY MOUTH  TWICE DAILY WITH MEALS 180 tablet 3  . simvastatin (ZOCOR) 80 MG tablet TAKE 1 TABLET BY MOUTH AT  BEDTIME 90 tablet 3  . venlafaxine XR (EFFEXOR-XR) 75 MG 24 hr capsule TAKE 1 CAPSULE BY MOUTH  DAILY 90 capsule 3  . vitamin B-12 (CYANOCOBALAMIN) 1000 MCG tablet Take 1 tablet (1,000 mcg total) by mouth daily. 90 tablet 3   No current facility-administered medications on file prior to visit.        ROS:  All others reviewed and negative.  Objective        PE:  BP 112/70 (BP Location: Right Arm, Patient Position: Sitting, Cuff Size: Large)   Pulse (!) 58   Temp 98.1 F (36.7 C) (Oral)   Ht 6' (1.829 m)   Wt 292 lb (132.5 kg)   SpO2 97%   BMI 39.60 kg/m                 Constitutional: Pt appears in NAD               HENT: Head: NCAT.                Right Ear: External ear normal.                 Left Ear: External ear normal.                Eyes: . Pupils are equal, round, and reactive to light. Conjunctivae and EOM are normal               Nose: without d/c or deformity               Neck: Neck supple. Gross normal ROM               Cardiovascular: Normal rate and regular rhythm.                 Pulmonary/Chest: Effort normal and breath sounds without rales or wheezing.                Abd:  Soft, NT, ND, + BS, no organomegaly               Neurological: Pt is alert. At baseline orientation, motor grossly intact               Skin: Skin is warm. No rashes, no other new lesions, LE edema - none               Psychiatric:  Pt behavior is normal without agitation   Micro: none  Cardiac tracings I have personally interpreted today:  none  Pertinent Radiological findings (summarize): none   Lab Results  Component Value Date   WBC 9.9 04/24/2021   HGB  14.0 04/24/2021   HCT 41.8 04/24/2021   PLT 235.0 04/24/2021   GLUCOSE 152 (H) 04/24/2021   CHOL 129 04/24/2021   TRIG 117.0 04/24/2021   HDL 35.30 (L) 04/24/2021   LDLDIRECT 65.0 06/04/2019   LDLCALC 70 04/24/2021   ALT 23 04/24/2021   AST 17 04/24/2021   NA 143 04/24/2021   K 4.3 04/24/2021   CL 104 04/24/2021   CREATININE 0.73 04/24/2021   BUN 15 04/24/2021   CO2 29 04/24/2021   TSH 2.34 04/24/2021   PSA 0.08 (L) 04/24/2021   INR 2.0 ratio (H) 01/16/2010   HGBA1C 6.7 (H) 04/24/2021   MICROALBUR 0.8 04/24/2021   Assessment/Plan:  Leon Nelson is a 65 y.o. White or Caucasian [1] male with  has a past medical history of ABDOMINAL PAIN OTHER SPECIFIED SITE (07/20/2008), CHEST PAIN (02/06/2011), COLONIC POLYPS, HX OF (05/26/2008), DEPRESSION (05/26/2008), DIABETES MELLITUS, TYPE II (05/26/2008), HYPERLIPIDEMIA (05/26/2008), HYPERTENSION (05/26/2008), HYPOTHYROIDISM (05/26/2008), KNEE PAIN, RIGHT, ACUTE (07/01/2008), Personal history of urinary calculi (05/26/2008), and UTI'S, HX OF (05/26/2008).  Vitamin D deficiency Last vitamin D Lab Results  Component Value Date   VD25OH 37.14 04/24/2021   Stable, cont oral replacement   Diabetes mellitus type 2 with neurological manifestations ([place]) Lab Results  Component Value Date   HGBA1C 6.7 (H) 04/24/2021   Stable, pt to continue current medical treatment glipziide, metformin   B12 deficiency Lab Results  Component Value Date   VITAMINB12 1,365 (H) 04/24/2021   Overcontrolled; to decrease the b12 to three times weekly  RUQ pain Chronic persistent, for MRI liver and GI referral,  to f/u any worsening symptoms or concerns  Followup: Return in about 6 months (around 11/10/2021).  [name], MD 05/14/2021 11:43 AM [facility]

## 2021-05-10 NOTE — Patient Instructions (Signed)
Please continue all other medications as before, and refills have been done if requested.  Please have the pharmacy call with any other refills you may need.  Please continue your efforts at being more active, low cholesterol diet, and weight control.  You are otherwise up to date with prevention measures today.  Please keep your appointments with your specialists as you may have planned  You will be contacted regarding the referral for: MRI for the abdomen, and the GI referral  Please make an Appointment to return in 6 months, or sooner if needed, also with Lab Appointment for testing done 3-5 days before at the [facility] (so this is for TWO appointments - please see the scheduling desk as you leave)  Due to the ongoing Covid 19 pandemic, our lab now requires an appointment for any labs done at our office.  If you need labs done and do not have an appointment, please call our office ahead of time to schedule before presenting to the lab for your testing.

## 2021-05-14 ENCOUNTER — Encounter: Payer: Self-pay | Admitting: Internal Medicine

## 2021-05-14 ENCOUNTER — Other Ambulatory Visit: Payer: Self-pay | Admitting: Internal Medicine

## 2021-05-14 NOTE — Assessment & Plan Note (Signed)
Lab Results  Component Value Date   VUYEBXID56 8,616 (H) 04/24/2021   Overcontrolled; to decrease the b12 to three times weekly

## 2021-05-14 NOTE — Assessment & Plan Note (Signed)
Last vitamin D Lab Results  Component Value Date   VD25OH 37.14 04/24/2021   Stable, cont oral replacement

## 2021-05-14 NOTE — Assessment & Plan Note (Signed)
Lab Results  Component Value Date   HGBA1C 6.7 (H) 04/24/2021   Stable, pt to continue current medical treatment glipziide, metformin

## 2021-05-14 NOTE — Assessment & Plan Note (Signed)
Chronic persistent, for MRI liver and GI referral,  to f/u any worsening symptoms or concerns

## 2021-05-15 ENCOUNTER — Telehealth: Payer: Self-pay | Admitting: Internal Medicine

## 2021-05-15 MED ORDER — DIAZEPAM 5 MG PO TABS: ORAL_TABLET | ORAL | 0 refills | Status: DC

## 2021-05-15 NOTE — Telephone Encounter (Signed)
   Patient requesting valium prior to MRI on 5/27  [place], [place]

## 2021-05-15 NOTE — Telephone Encounter (Signed)
Ok done erx to [facility] 

## 2021-05-15 NOTE — Telephone Encounter (Signed)
PMP done and last filled 06/23/20

## 2021-05-19 ENCOUNTER — Ambulatory Visit
Admission: RE | Admit: 2021-05-19 | Discharge: 2021-05-19 | Disposition: A | Discharge status: Home / Self Care | Payer: Medicare Other | Source: Ambulatory Visit | Attending: Internal Medicine | Admitting: Internal Medicine

## 2021-05-19 ENCOUNTER — Other Ambulatory Visit: Payer: Self-pay

## 2021-05-19 DIAGNOSIS — K760 Fatty (change of) liver, not elsewhere classified: Secondary | ICD-10-CM | POA: Diagnosis not present

## 2021-05-19 DIAGNOSIS — E278 Other specified disorders of adrenal gland: Secondary | ICD-10-CM | POA: Diagnosis not present

## 2021-05-19 DIAGNOSIS — K862 Cyst of pancreas: Secondary | ICD-10-CM | POA: Diagnosis not present

## 2021-05-19 DIAGNOSIS — K769 Liver disease, unspecified: Secondary | ICD-10-CM

## 2021-05-19 MED ORDER — GADOBENATE DIMEGLUMINE 529 MG/ML IV SOLN
20.0000 mL | Freq: Once | INTRAVENOUS | Status: AC | PRN
  Administered 2021-05-19: 20 mL via INTRAVENOUS

## 2021-05-20 ENCOUNTER — Encounter: Payer: Self-pay | Admitting: Internal Medicine

## 2021-05-26 DIAGNOSIS — E119 Type 2 diabetes mellitus without complications: Secondary | ICD-10-CM | POA: Diagnosis not present

## 2021-05-26 LAB — HM DIABETES EYE EXAM

## 2021-06-19 ENCOUNTER — Telehealth: Payer: Self-pay | Admitting: Internal Medicine

## 2021-06-19 MED ORDER — METFORMIN HCL ER 500 MG PO TB24: 500.0000 mg | ORAL_TABLET | Freq: Every day | ORAL | 3 refills | Status: DC

## 2021-06-19 NOTE — Telephone Encounter (Signed)
Ok that may be true  I sent a rx for metformin ER 500 mg for 3 per day (less than what he is taking now) to mail in pharmacy

## 2021-06-19 NOTE — Telephone Encounter (Signed)
   Patient called and was wondering if metFORMIN (GLUCOPHAGE-XR) 500 MG 24 hr tablet could be called in. He said that he thinks taking the 1000mg  is the cause for his diarrhea. He said that the diarrhea started when he switched medications. He is requesting that it be sent to [facility]  ([place], [place]. Please advise

## 2021-06-26 ENCOUNTER — Other Ambulatory Visit: Payer: Self-pay | Admitting: Podiatry

## 2021-07-03 ENCOUNTER — Other Ambulatory Visit: Payer: Self-pay

## 2021-07-03 ENCOUNTER — Ambulatory Visit: Payer: Medicare Other | Admitting: Podiatry

## 2021-07-03 DIAGNOSIS — Q828 Other specified congenital malformations of skin: Secondary | ICD-10-CM

## 2021-07-03 DIAGNOSIS — M79674 Pain in right toe(s): Secondary | ICD-10-CM | POA: Diagnosis not present

## 2021-07-03 DIAGNOSIS — M79675 Pain in left toe(s): Secondary | ICD-10-CM | POA: Diagnosis not present

## 2021-07-03 DIAGNOSIS — B351 Tinea unguium: Secondary | ICD-10-CM | POA: Diagnosis not present

## 2021-07-03 DIAGNOSIS — E1149 Type 2 diabetes mellitus with other diabetic neurological complication: Secondary | ICD-10-CM | POA: Diagnosis not present

## 2021-07-05 NOTE — Progress Notes (Signed)
Subjective: 65 year old male presents the office today for diabetic foot evaluation and for calluses to both of his great toes as well as the toenails he cannot trim himself. He still has been some numbness to his feet, still on gabapentin. He states the surgery foot has been doing well without any concerns. Denies any systemic complaints such as fevers, chills, nausea, vomiting. No acute changes since last appointment, and no other complaints at this time.   A1c on 04/24/2021 was 6.7  Objective: AAO x3, NAD DP/PT pulses palpable bilaterally, CRT less than 3 seconds Nails are hypertrophic, dystrophic, brittle, discolored, elongated 10. No surrounding redness or drainage. Tenderness nails 1-5 bilaterally.   Hyperkeratotic lesions to bilateral hallux.  Upon debridement no underlying ulceration, drainage or any signs of infection.  No other open lesions or pre-ulcerative lesions are identified today.No pain with calf compression, swelling, warmth, erythema  Assessment: 65 year old male with symptomatic onychomycosis, hyperkeratotic lesions; neuropathy  Plan: -All treatment options discussed with the patient including all alternatives, risks, complications.  -Nails debrided x10 without any complications or bleeding -Hyperkeratotic lesion sharply treated x2 without complications -Continue gabapentin.  -Discussed the importance of daily foot inspection. -Patient encouraged to call the office with any questions, concerns, change in symptoms.   Return in about 3 months  Trula Slade DPM

## 2021-07-07 ENCOUNTER — Encounter: Payer: Self-pay | Admitting: Internal Medicine

## 2021-09-05 ENCOUNTER — Other Ambulatory Visit: Payer: Self-pay | Admitting: Internal Medicine

## 2021-09-05 NOTE — Telephone Encounter (Signed)
Please refill as per office routine med refill policy (all routine meds to be refilled for 3 mo or monthly (per pt preference) up to one year from last visit, then month to month grace period for 3 mo, then further med refills will have to be denied) ? ?

## 2021-10-02 ENCOUNTER — Other Ambulatory Visit: Payer: Self-pay

## 2021-10-02 ENCOUNTER — Ambulatory Visit: Payer: Medicare Other | Admitting: Podiatry

## 2021-10-02 DIAGNOSIS — B351 Tinea unguium: Secondary | ICD-10-CM

## 2021-10-02 DIAGNOSIS — Q828 Other specified congenital malformations of skin: Secondary | ICD-10-CM

## 2021-10-02 DIAGNOSIS — E1149 Type 2 diabetes mellitus with other diabetic neurological complication: Secondary | ICD-10-CM

## 2021-10-02 DIAGNOSIS — M79674 Pain in right toe(s): Secondary | ICD-10-CM

## 2021-10-02 DIAGNOSIS — M79675 Pain in left toe(s): Secondary | ICD-10-CM

## 2021-10-02 NOTE — Patient Instructions (Signed)

## 2021-10-04 NOTE — Progress Notes (Signed)
Subjective: 65 year old male presents the office today for diabetic foot evaluation and for calluses to both of his great toes as well as the toenails he cannot trim himself.  Otherwise he states his feet been doing well.  He has no new concerns today.  No open lesions.   A1c on 04/24/2021 was 6.7  Objective: AAO x3, NAD DP/PT pulses palpable bilaterally, CRT less than 3 seconds Nails are hypertrophic, dystrophic, brittle, discolored, elongated 10. No surrounding redness or drainage. Tenderness nails 1-5 bilaterally.   Hyperkeratotic lesions to bilateral hallux.  Upon debridement no underlying ulceration, drainage or any signs of infection.  No other open lesions or pre-ulcerative lesions are identified today.No pain with calf compression, swelling, warmth, erythema  Assessment: 65 year old male with symptomatic onychomycosis, hyperkeratotic lesions; neuropathy  Plan: -All treatment options discussed with the patient including all alternatives, risks, complications.  -Nails debrided x10 without any complications or bleeding -Hyperkeratotic lesion sharply treated x2 without complications -Continue gabapentin.  -Discussed the importance of daily foot inspection. -Patient encouraged to call the office with any questions, concerns, change in symptoms.   Return in about 3 months  Trula Slade DPM

## 2021-10-30 ENCOUNTER — Other Ambulatory Visit: Payer: Self-pay | Admitting: Internal Medicine

## 2021-11-06 ENCOUNTER — Other Ambulatory Visit: Payer: Self-pay | Admitting: Internal Medicine

## 2021-11-06 DIAGNOSIS — I10 Essential (primary) hypertension: Secondary | ICD-10-CM

## 2021-11-06 NOTE — Telephone Encounter (Signed)
Please refill as per office routine med refill policy (all routine meds to be refilled for 3 mo or monthly (per pt preference) up to one year from last visit, then month to month grace period for 3 mo, then further med refills will have to be denied) ? ?

## 2021-11-10 ENCOUNTER — Other Ambulatory Visit: Payer: Medicare Other

## 2021-11-13 ENCOUNTER — Ambulatory Visit: Payer: Medicare Other | Admitting: Internal Medicine

## 2022-01-02 ENCOUNTER — Ambulatory Visit: Payer: Medicare Other | Admitting: Podiatry

## 2022-01-02 ENCOUNTER — Other Ambulatory Visit: Payer: Self-pay

## 2022-01-02 DIAGNOSIS — Q828 Other specified congenital malformations of skin: Secondary | ICD-10-CM

## 2022-01-02 DIAGNOSIS — E1149 Type 2 diabetes mellitus with other diabetic neurological complication: Secondary | ICD-10-CM | POA: Diagnosis not present

## 2022-01-02 DIAGNOSIS — B351 Tinea unguium: Secondary | ICD-10-CM | POA: Diagnosis not present

## 2022-01-02 DIAGNOSIS — M79675 Pain in left toe(s): Secondary | ICD-10-CM

## 2022-01-02 DIAGNOSIS — M792 Neuralgia and neuritis, unspecified: Secondary | ICD-10-CM

## 2022-01-02 DIAGNOSIS — M79674 Pain in right toe(s): Secondary | ICD-10-CM

## 2022-01-02 NOTE — Progress Notes (Signed)
Subjective: 66 year old male presents the office today for diabetic foot evaluation and for calluses to both of his great toes as well as the toenails he cannot trim himself.  States that the neuropathy has been getting worse. He is still on gabapentin 300mg  2 tablets at night. He states it is more the numbness that bothers him.   He has no new concerns today.  No open lesions.   A1c on 04/24/2021 was 6.7  Objective: AAO x3, NAD DP/PT pulses palpable bilaterally, CRT less than 3 seconds Nails are hypertrophic, dystrophic, brittle, discolored, elongated 10. No surrounding redness or drainage. Tenderness nails 1-5 bilaterally.   Hyperkeratotic lesions to bilateral hallux and left submetatarsal 2.  Upon debridement no underlying ulceration, drainage or any signs of infection.  No other open lesions or pre-ulcerative lesions are identified today.No pain with calf compression, swelling, warmth, erythema  Assessment: 66 year old male with symptomatic onychomycosis, hyperkeratotic lesions; neuropathy  Plan: -All treatment options discussed with the patient including all alternatives, risks, complications.  -Nails debrided x10 without any complications or bleeding -Hyperkeratotic lesion sharply treated x3 without complications -Continue gabapentin.  He has not been able to tolerate increased dose of the gabapentin as it causes side effects.  We discussed topical treatments that he can use as well as needed.  Discussed with him the neuropathy as far as the numbness is very difficult to treat.  He is previously explored alternative treatments which are not helpful as well and advised against that as they are normally not helpful.  -Discussed the importance of daily foot inspection. -Patient encouraged to call the office with any questions, concerns, change in symptoms.   Return in about 3 months  Trula Slade DPM

## 2022-01-08 ENCOUNTER — Other Ambulatory Visit: Payer: Self-pay | Admitting: Internal Medicine

## 2022-01-08 DIAGNOSIS — I10 Essential (primary) hypertension: Secondary | ICD-10-CM

## 2022-01-08 NOTE — Telephone Encounter (Signed)
Please refill as per office routine med refill policy (all routine meds to be refilled for 3 mo or monthly (per pt preference) up to one year from last visit, then month to month grace period for 3 mo, then further med refills will have to be denied) ? ?

## 2022-01-17 ENCOUNTER — Other Ambulatory Visit: Payer: Self-pay

## 2022-01-17 ENCOUNTER — Ambulatory Visit (INDEPENDENT_AMBULATORY_CARE_PROVIDER_SITE_OTHER): Payer: Medicare Other | Admitting: Internal Medicine

## 2022-01-17 ENCOUNTER — Ambulatory Visit (INDEPENDENT_AMBULATORY_CARE_PROVIDER_SITE_OTHER): Payer: Medicare Other

## 2022-01-17 ENCOUNTER — Encounter: Payer: Self-pay | Admitting: Internal Medicine

## 2022-01-17 VITALS — BP 120/84 | HR 52 | Ht 72.0 in | Wt 296.1 lb

## 2022-01-17 VITALS — BP 120/84 | HR 52 | Ht 72.0 in | Wt 296.0 lb

## 2022-01-17 DIAGNOSIS — E039 Hypothyroidism, unspecified: Secondary | ICD-10-CM

## 2022-01-17 DIAGNOSIS — E1149 Type 2 diabetes mellitus with other diabetic neurological complication: Secondary | ICD-10-CM | POA: Diagnosis not present

## 2022-01-17 DIAGNOSIS — I10 Essential (primary) hypertension: Secondary | ICD-10-CM

## 2022-01-17 DIAGNOSIS — E559 Vitamin D deficiency, unspecified: Secondary | ICD-10-CM | POA: Diagnosis not present

## 2022-01-17 DIAGNOSIS — Z23 Encounter for immunization: Secondary | ICD-10-CM | POA: Diagnosis not present

## 2022-01-17 DIAGNOSIS — Z0000 Encounter for general adult medical examination without abnormal findings: Secondary | ICD-10-CM

## 2022-01-17 DIAGNOSIS — Z0001 Encounter for general adult medical examination with abnormal findings: Secondary | ICD-10-CM

## 2022-01-17 DIAGNOSIS — E538 Deficiency of other specified B group vitamins: Secondary | ICD-10-CM | POA: Diagnosis not present

## 2022-01-17 DIAGNOSIS — E782 Mixed hyperlipidemia: Secondary | ICD-10-CM

## 2022-01-17 LAB — BASIC METABOLIC PANEL
BUN: 14 mg/dL (ref 6–23)
CO2: 31 mEq/L (ref 19–32)
Calcium: 9.2 mg/dL (ref 8.4–10.5)
Chloride: 100 mEq/L (ref 96–112)
Creatinine, Ser: 0.9 mg/dL (ref 0.40–1.50)
GFR: 88.63 mL/min (ref >60.00)
Glucose, Bld: 202 mg/dL — ABNORMAL HIGH (ref 70–99)
Potassium: 4.3 mEq/L (ref 3.5–5.1)
Sodium: 138 mEq/L (ref 135–145)

## 2022-01-17 LAB — MICROALBUMIN / CREATININE URINE RATIO
Creatinine,U: 99.4 mg/dL
Microalb Creat Ratio: 1.6 mg/g (ref 0.0–30.0)
Microalb, Ur: 1.6 mg/dL (ref 0.0–1.9)

## 2022-01-17 LAB — CBC WITH DIFFERENTIAL/PLATELET
Basophils Absolute: 0 10*3/uL (ref 0.0–0.1)
Basophils Relative: 0.5 % (ref 0.0–3.0)
Eosinophils Absolute: 0.3 10*3/uL (ref 0.0–0.7)
Eosinophils Relative: 4.5 % (ref 0.0–5.0)
HCT: 43.6 % (ref 39.0–52.0)
Hemoglobin: 14.5 g/dL (ref 13.0–17.0)
Lymphocytes Relative: 31.6 % (ref 12.0–46.0)
Lymphs Abs: 2.4 10*3/uL (ref 0.7–4.0)
MCHC: 33.2 g/dL (ref 30.0–36.0)
MCV: 88.1 fl (ref 78.0–100.0)
Monocytes Absolute: 0.6 10*3/uL (ref 0.1–1.0)
Monocytes Relative: 7.8 % (ref 3.0–12.0)
Neutro Abs: 4.3 10*3/uL (ref 1.4–7.7)
Neutrophils Relative %: 55.6 % (ref 43.0–77.0)
Platelets: 208 10*3/uL (ref 150.0–400.0)
RBC: 4.95 Mil/uL (ref 4.22–5.81)
RDW: 13.1 % (ref 11.5–15.5)
WBC: 7.7 10*3/uL (ref 4.0–10.5)

## 2022-01-17 LAB — TSH: TSH: 3.45 u[IU]/mL (ref 0.35–5.50)

## 2022-01-17 LAB — LIPID PANEL
Cholesterol: 145 mg/dL (ref 0–200)
HDL: 33.1 mg/dL — ABNORMAL LOW (ref >39.00)
LDL Cholesterol: 80 mg/dL (ref 0–99)
NonHDL: 111.77
Total CHOL/HDL Ratio: 4
Triglycerides: 157 mg/dL — ABNORMAL HIGH (ref 0.0–149.0)
VLDL: 31.4 mg/dL (ref 0.0–40.0)

## 2022-01-17 LAB — URINALYSIS, ROUTINE W REFLEX MICROSCOPIC
Bilirubin Urine: NEGATIVE
Hgb urine dipstick: NEGATIVE
Ketones, ur: NEGATIVE
Leukocytes,Ua: NEGATIVE
Nitrite: NEGATIVE
RBC / HPF: NONE SEEN (ref 0–?)
Specific Gravity, Urine: 1.02 (ref 1.000–1.030)
Total Protein, Urine: NEGATIVE
Urine Glucose: NEGATIVE
Urobilinogen, UA: 0.2 (ref 0.0–1.0)
pH: 6 (ref 5.0–8.0)

## 2022-01-17 LAB — VITAMIN B12: Vitamin B-12: 779 pg/mL (ref 211–911)

## 2022-01-17 LAB — HEMOGLOBIN A1C: Hgb A1c MFr Bld: 8.3 % — ABNORMAL HIGH (ref 4.6–6.5)

## 2022-01-17 LAB — HEPATIC FUNCTION PANEL
ALT: 30 U/L (ref 0–53)
AST: 25 U/L (ref 0–37)
Albumin: 4.2 g/dL (ref 3.5–5.2)
Alkaline Phosphatase: 51 U/L (ref 39–117)
Bilirubin, Direct: 0.1 mg/dL (ref 0.0–0.3)
Total Bilirubin: 0.5 mg/dL (ref 0.2–1.2)
Total Protein: 6.8 g/dL (ref 6.0–8.3)

## 2022-01-17 LAB — PSA: PSA: 0.07 ng/mL — ABNORMAL LOW (ref 0.10–4.00)

## 2022-01-17 LAB — VITAMIN D 25 HYDROXY (VIT D DEFICIENCY, FRACTURES): VITD: 45.04 ng/mL (ref 30.00–100.00)

## 2022-01-17 NOTE — Assessment & Plan Note (Signed)
BP Readings from Last 3 Encounters:  01/17/22 120/84  05/10/21 112/70  04/24/21 128/70   Stable, pt to continue medical treatment tenoretic

## 2022-01-17 NOTE — Assessment & Plan Note (Signed)
Last vitamin D Lab Results  Component Value Date   VD25OH 37.14 04/24/2021   Low, to start oral replacement

## 2022-01-17 NOTE — Assessment & Plan Note (Signed)
Age and sex appropriate education and counseling updated with regular exercise and diet Referrals for preventative services - declines colonoscopy Immunizations addressed - declines covid booster, shingrx Smoking counseling  - none needed Evidence for depression or other mood disorder - none significant Most recent labs reviewed. I have personally reviewed and have noted: 1) the patient's medical and social history 2) The patient's current medications and supplements 3) The patient's height, weight, and BMI have been recorded in the chart

## 2022-01-17 NOTE — Assessment & Plan Note (Signed)
Lab Results  Component Value Date   HGBA1C 6.7 (H) 04/24/2021   Stable, pt to continue current medical treatment glucotrol, metformin ER

## 2022-01-17 NOTE — Assessment & Plan Note (Signed)
Lab Results  Component Value Date   TSH 2.34 04/24/2021   Stable, pt to continue levothyroxine

## 2022-01-17 NOTE — Progress Notes (Signed)
Patient ID: Leon Nelson, male   DOB: May 21, 1956, 66 y.o.   MRN: [id number]         Chief Complaint:: wellness exam and dm, htn, hld, low vit d       HPI:  Leon Nelson is a 66 y.o. male here for wellness exam. Declines covid booster, colonoscopy, shingrix o/w up to date                        Also has been more sedentary and has more sob/doe recntly but believes may be wt related and deconditioning.  Wt got down to 282 with better diet last yr, but unfortunately gained wt back again working from home.  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 Has 2 children in college.  Pt denies chest pain, wheezing, orthopnea, PND, increased LE swelling, palpitations, dizziness or syncope.   Pt denies polydipsia, polyuria, or new focal neuro s/s.   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 but this did not work out well  [facility] Readings from Last 3 Encounters:  01/17/22 296 lb (134.3 kg)  05/10/21 292 lb (132.5 kg)  04/24/21 294 lb (133.4 kg)   BP Readings from Last 3 Encounters:  01/17/22 120/84  05/10/21 112/70  04/24/21 128/70   Immunization History  Administered Date(s) Administered   Fluad Quad(high Dose 65+) 12/04/2020, 01/17/2022   H1N1 01/24/2009   Influenza Split 09/21/2011   Influenza Whole 09/23/2008, 09/23/2009   Influenza, Seasonal, Injecte, Preservative Fre 02/11/2013   Influenza,inj,Quad PF,6+ Mos 01/20/2014, 09/16/2015, 11/06/2019   Influenza-Unspecified 09/22/2016, 10/20/2018   PFIZER(Purple Top)SARS-COV-2 Vaccination 01/25/2020, 02/15/2020, 12/04/2020   Pneumococcal Conjugate-13 11/06/2019   Pneumococcal Polysaccharide-23 12/24/2005, 07/21/2014, 04/24/2021   Td 01/24/2009   Tdap 11/06/2019   Health Maintenance Due  Topic Date Due   HEMOGLOBIN A1C  10/25/2021      Past Medical History:   Diagnosis Date   ABDOMINAL PAIN OTHER SPECIFIED SITE 07/20/2008   CHEST PAIN 02/06/2011   COLONIC POLYPS, HX OF 05/26/2008   DEPRESSION 05/26/2008   DIABETES MELLITUS, TYPE II 05/26/2008   HYPERLIPIDEMIA 05/26/2008   HYPERTENSION 05/26/2008   HYPOTHYROIDISM 05/26/2008   KNEE PAIN, RIGHT, ACUTE 07/01/2008   Personal history of urinary calculi 05/26/2008   UTI'S, HX OF 05/26/2008   Past Surgical History:  Procedure Laterality Date   s/p right knee arthroplasty      complicated by patellar tendon rupture Jan 2011 Dr. Gladstone Lighter   TONSILLECTOMY      reports that he has never smoked. He has never used smokeless tobacco. He reports that he does not drink alcohol and does not use drugs. family history includes Alcohol abuse in an other family member; Cancer in his mother; Diabetes in an other family member; Heart disease in his father and mother; Hyperlipidemia in his father and mother. Allergies  Allergen Reactions   Penicillins Other (See Comments)    Blisters on hands   Sulfonamide Derivatives Other (See Comments)    Blisters in groin area   Current Outpatient Medications on File Prior to Visit  Medication Sig Dispense Refill   allopurinol (ZYLOPRIM) 300 MG tablet TAKE 1 TABLET BY MOUTH  DAILY 90 tablet 1   atenolol-chlorthalidone (TENORETIC) 50-25 MG tablet TAKE ONE-HALF TABLET BY MOUTH  DAILY 45  tablet 1   Blood Glucose Monitoring Suppl (ONE TOUCH ULTRA 2) w/Device KIT Use as directed once daily E11.9 1 each 0   diazepam (VALIUM) 5 MG tablet 1 tab by mouth at 45 min prior to procedure 1 tablet 0   diclofenac sodium (VOLTAREN) 1 % GEL Apply 2 g topically 4 (four) times daily as needed. 200 g 5   dicyclomine (BENTYL) 20 MG tablet TAKE 1 TABLET BY MOUTH 3  TIMES DAILY AS NEEDED 270 tablet 2   gabapentin (NEURONTIN) 300 MG capsule TAKE 1 CAPSULE BY MOUTH  TWICE DAILY 180 capsule 3   glipiZIDE (GLUCOTROL XL) 10 MG 24 hr tablet TAKE 1 TABLET BY MOUTH  DAILY WITH BREAKFAST 90 tablet 1   glucose blood (ONE  TOUCH ULTRA TEST) test strip Use as instructed once daily E11.9 100 each 12   Lancets MISC Use as directed once daily E11.9 100 each 3   levothyroxine (SYNTHROID) 50 MCG tablet TAKE 1 TABLET BY MOUTH  DAILY 90 tablet 1   lisinopril (ZESTRIL) 20 MG tablet TAKE 1 TABLET BY MOUTH  DAILY 90 tablet 3   metFORMIN (GLUCOPHAGE-XR) 500 MG 24 hr tablet Take 1 tablet (500 mg total) by mouth daily with breakfast. 270 tablet 3   potassium chloride SA (KLOR-CON) 20 MEQ tablet TAKE 2 TABLETS BY MOUTH  DAILY 180 tablet 3   simvastatin (ZOCOR) 80 MG tablet TAKE 1 TABLET BY MOUTH AT  BEDTIME 90 tablet 1   venlafaxine XR (EFFEXOR-XR) 75 MG 24 hr capsule TAKE 1 CAPSULE BY MOUTH  DAILY 90 capsule 1   vitamin B-12 (CYANOCOBALAMIN) 1000 MCG tablet Take 1 tablet (1,000 mcg total) by mouth daily. 90 tablet 3   No current facility-administered medications on file prior to visit.        ROS:  All others reviewed and negative.  Objective        PE:  BP 120/84 (BP Location: Left Arm, Patient Position: Sitting, Cuff Size: Large)    Pulse (!) 52    Ht 6' (1.829 m)    Wt 296 lb (134.3 kg)    SpO2 97%    BMI 40.14 kg/m                 Constitutional: Pt appears in NAD               HENT: Head: NCAT.                Right Ear: External ear normal.                 Left Ear: External ear normal.                Eyes: . Pupils are equal, round, and reactive to light. Conjunctivae and EOM are normal               Nose: without d/c or deformity               Neck: Neck supple. Gross normal ROM               Cardiovascular: Normal rate and regular rhythm.                 Pulmonary/Chest: Effort normal and breath sounds without rales or wheezing.                Abd:  Soft, NT, ND, + BS, no organomegaly  Neurological: Pt is alert. At baseline orientation, motor grossly intact               Skin: Skin is warm. No rashes, no other new lesions, LE edema - none               Psychiatric: Pt behavior is normal without  agitation   Micro: none  Cardiac tracings I have personally interpreted today:  none  Pertinent Radiological findings (summarize): none   Lab Results  Component Value Date   WBC 9.9 04/24/2021   HGB 14.0 04/24/2021   HCT 41.8 04/24/2021   PLT 235.0 04/24/2021   GLUCOSE 152 (H) 04/24/2021   CHOL 129 04/24/2021   TRIG 117.0 04/24/2021   HDL 35.30 (L) 04/24/2021   LDLDIRECT 65.0 06/04/2019   LDLCALC 70 04/24/2021   ALT 23 04/24/2021   AST 17 04/24/2021   NA 143 04/24/2021   K 4.3 04/24/2021   CL 104 04/24/2021   CREATININE 0.73 04/24/2021   BUN 15 04/24/2021   CO2 29 04/24/2021   TSH 2.34 04/24/2021   PSA 0.08 (L) 04/24/2021   INR 2.0 ratio (H) 01/16/2010   HGBA1C 6.7 (H) 04/24/2021   MICROALBUR 0.8 04/24/2021   Assessment/Plan:  Leon Nelson is a 66 y.o. White or Caucasian [1] male with  has a past medical history of ABDOMINAL PAIN OTHER SPECIFIED SITE (07/20/2008), CHEST PAIN (02/06/2011), COLONIC POLYPS, HX OF (05/26/2008), DEPRESSION (05/26/2008), DIABETES MELLITUS, TYPE II (05/26/2008), HYPERLIPIDEMIA (05/26/2008), HYPERTENSION (05/26/2008), HYPOTHYROIDISM (05/26/2008), KNEE PAIN, RIGHT, ACUTE (07/01/2008), Personal history of urinary calculi (05/26/2008), and UTI'S, HX OF (05/26/2008).  Vitamin D deficiency Last vitamin D Lab Results  Component Value Date   VD25OH 37.14 04/24/2021   Low, to start oral replacement   Encounter for well adult exam with abnormal findings Age and sex appropriate education and counseling updated with regular exercise and diet Referrals for preventative services - declines colonoscopy Immunizations addressed - declines covid booster, shingrx Smoking counseling  - none needed Evidence for depression or other mood disorder - none significant Most recent labs reviewed. I have personally reviewed and have noted: 1) the patient's medical and social history 2) The patient's current medications and supplements 3) The patient's height, weight, and BMI have  been recorded in the chart   B12 deficiency Lab Results  Component Value Date   VITAMINB12 1,365 (H) 04/24/2021   Stable, cont oral replacement - b12 1000 mcg qd   Diabetes mellitus type 2 with neurological manifestations ([place]) Lab Results  Component Value Date   HGBA1C 6.7 (H) 04/24/2021   Stable, pt to continue current medical treatment glucotrol, metformin ER   Essential hypertension BP Readings from Last 3 Encounters:  01/17/22 120/84  05/10/21 112/70  04/24/21 128/70   Stable, pt to continue medical treatment tenoretic   Hyperlipidemia Lab Results  Component Value Date   [place] 70 04/24/2021   Stable, pt to continue current statin zocor 80   Hypothyroidism Lab Results  Component Value Date   TSH 2.34 04/24/2021   Stable, pt to continue levothyroxine  Followup: No follow-ups on file.  [name], MD 01/17/2022 10:11 AM [facility]

## 2022-01-17 NOTE — Patient Instructions (Signed)
Please continue all other medications as before, and refills have been done if requested.  Please have the pharmacy call with any other refills you may need.  Please continue your efforts at being more active, low cholesterol diet, and weight control.  You are otherwise up to date with prevention measures today.  Please keep your appointments with your specialists as you may have planned  Please go to the LAB at the blood drawing area for the tests to be done  Please go to the LAB at the blood drawing area for the tests to be done  You will be contacted by phone if any changes need to be made immediately.  Otherwise, you will receive a letter about your results with an explanation, but please check with MyChart first.  Please remember to sign up for MyChart if you have not done so, as this will be important to you in the future with finding out test results, communicating by private email, and scheduling acute appointments online when needed.  Please make an Appointment to return in 6 months, or sooner if needed, also with Lab Appointment for testing done 3-5 days before at the [facility] (so this is for TWO appointments - please see the scheduling desk as you leave)  Due to the ongoing Covid 19 pandemic, our lab now requires an appointment for any labs done at our office.  If you need labs done and do not have an appointment, please call our office ahead of time to schedule before presenting to the lab for your testing.

## 2022-01-17 NOTE — Assessment & Plan Note (Signed)
Lab Results  Component Value Date   LDLCALC 70 04/24/2021   Stable, pt to continue current statin zocor 80

## 2022-01-17 NOTE — Assessment & Plan Note (Signed)
Lab Results  Component Value Date   VITAMINB12 1,365 (H) 04/24/2021   Stable, cont oral replacement - b12 1000 mcg qd

## 2022-01-17 NOTE — Progress Notes (Signed)
Subjective:   Leon Nelson is a 66 y.o. male who presents for Medicare Annual/Subsequent preventive examination.  Review of Systems     Cardiac Risk Factors include: advanced age (>32mn, >>31women);diabetes mellitus;dyslipidemia;family history of premature cardiovascular disease;hypertension;male gender;obesity (BMI >30kg/m2)     Objective:    Today's Vitals   01/17/22 1019  BP: 120/84  Pulse: (!) 52  SpO2: 97%  Weight: 296 lb 1.2 oz (134.3 kg)  Height: 6' (1.829 m)  PainSc: 0-No pain   Body mass index is 40.16 kg/m.  Advanced Directives 01/17/2022 12/07/2020 01/08/2019 01/07/2019  Does Patient Have a Medical Advance Directive? Yes Yes No No  Type of Advance Directive Living will;Healthcare Power of Attorney Living will;Healthcare Power of Attorney - -  Does patient want to make changes to medical advance directive? No - Patient declined No - Patient declined - -  Copy of [place] Chart? No - copy requested No - copy requested - -  Would patient like information on creating a medical advance directive? - - No - Patient declined No - Patient declined    Current Medications (verified) Outpatient Encounter Medications as of 01/17/2022  Medication Sig   allopurinol (ZYLOPRIM) 300 MG tablet TAKE 1 TABLET BY MOUTH  DAILY   atenolol-chlorthalidone (TENORETIC) 50-25 MG tablet TAKE ONE-HALF TABLET BY MOUTH  DAILY   Blood Glucose Monitoring Suppl (ONE TOUCH ULTRA 2) w/Device KIT Use as directed once daily E11.9   diazepam (VALIUM) 5 MG tablet 1 tab by mouth at 45 min prior to procedure   diclofenac sodium (VOLTAREN) 1 % GEL Apply 2 g topically 4 (four) times daily as needed.   dicyclomine (BENTYL) 20 MG tablet TAKE 1 TABLET BY MOUTH 3  TIMES DAILY AS NEEDED   gabapentin (NEURONTIN) 300 MG capsule TAKE 1 CAPSULE BY MOUTH  TWICE DAILY   glipiZIDE (GLUCOTROL XL) 10 MG 24 hr tablet TAKE 1 TABLET BY MOUTH  DAILY WITH BREAKFAST   glucose blood (ONE TOUCH ULTRA  TEST) test strip Use as instructed once daily E11.9   Lancets MISC Use as directed once daily E11.9   levothyroxine (SYNTHROID) 50 MCG tablet TAKE 1 TABLET BY MOUTH  DAILY   lisinopril (ZESTRIL) 20 MG tablet TAKE 1 TABLET BY MOUTH  DAILY   metFORMIN (GLUCOPHAGE-XR) 500 MG 24 hr tablet Take 1 tablet (500 mg total) by mouth daily with breakfast.   potassium chloride SA (KLOR-CON) 20 MEQ tablet TAKE 2 TABLETS BY MOUTH  DAILY   simvastatin (ZOCOR) 80 MG tablet TAKE 1 TABLET BY MOUTH AT  BEDTIME   venlafaxine XR (EFFEXOR-XR) 75 MG 24 hr capsule TAKE 1 CAPSULE BY MOUTH  DAILY   vitamin B-12 (CYANOCOBALAMIN) 1000 MCG tablet Take 1 tablet (1,000 mcg total) by mouth daily.   No facility-administered encounter medications on file as of 01/17/2022.    Allergies (verified) Penicillins and Sulfonamide derivatives   History: Past Medical History:  Diagnosis Date   ABDOMINAL PAIN OTHER SPECIFIED SITE 07/20/2008   CHEST PAIN 02/06/2011   COLONIC POLYPS, HX OF 05/26/2008   DEPRESSION 05/26/2008   DIABETES MELLITUS, TYPE II 05/26/2008   HYPERLIPIDEMIA 05/26/2008   HYPERTENSION 05/26/2008   HYPOTHYROIDISM 05/26/2008   KNEE PAIN, RIGHT, ACUTE 07/01/2008   Personal history of urinary calculi 05/26/2008   UTI'S, HX OF 05/26/2008   Past Surgical History:  Procedure Laterality Date   s/p right knee arthroplasty      complicated by patellar tendon rupture Jan 2011 Dr.  gioffre   TONSILLECTOMY     Family History  Problem Relation Age of Onset   Cancer Mother        breast cancer   Hyperlipidemia Mother    Heart disease Mother    Hyperlipidemia Father    Heart disease Father    Alcohol abuse Other    Diabetes Other    Social History   Socioeconomic History   Marital status: Married    Spouse name: Not on file   Number of children: Not on file   Years of education: Not on file   Highest education level: Not on file  Occupational History   Not on file  Tobacco Use   Smoking status: Never   Smokeless  tobacco: Never  Substance and Sexual Activity   Alcohol use: No   Drug use: No   Sexual activity: Not on file  Other Topics Concern   Not on file  Social History Narrative   Not on file   Social Determinants of Health   Financial Resource Strain: Low Risk    Difficulty of Paying Living Expenses: Not hard at all  Food Insecurity: No Food Insecurity   Worried About Charity fundraiser in the Last Year: Never true   [place] in the Last Year: Never true  Transportation Needs: No Transportation Needs   Lack of Transportation (Medical): No   Lack of Transportation (Non-Medical): No  Physical Activity: Inactive   Days of Exercise per Week: 0 days   Minutes of Exercise per Session: 0 min  Stress: No Stress Concern Present   Feeling of Stress : Not at all  Social Connections: Socially Integrated   Frequency of Communication with Friends and Family: More than three times a week   Frequency of Social Gatherings with Friends and Family: Once a week   Attends Religious Services: More than 4 times per year   Active Member of [employer] or Organizations: No   Attends Music therapist: More than 4 times per year   Marital Status: Married    Tobacco Counseling Counseling given: Not Answered   Clinical Intake:  Pre-visit preparation completed: Yes  Pain : No/denies pain Pain Score: 0-No pain     BMI - recorded: 40.16 Nutritional Status: BMI > 30  Obese Nutritional Risks: None Diabetes: Yes CBG done?: No Did pt. bring in CBG monitor from home?: No  How often do you need to have someone help you when you read instructions, pamphlets, or other written materials from your doctor or pharmacy?: 1 - Never  Diabetic? yes  Interpreter Needed?: No  Information entered by :: Lisette Abu, LPN   Activities of Daily Living In your present state of health, do you have any difficulty performing the following activities: 01/17/2022  Hearing? N  Vision? N   Difficulty concentrating or making decisions? N  Walking or climbing stairs? N  Dressing or bathing? N  Doing errands, shopping? N  Preparing Food and eating ? N  Using the Toilet? N  In the past six months, have you accidently leaked urine? N  Do you have problems with loss of bowel control? N  Managing your Medications? N  Managing your Finances? N  Housekeeping or managing your Housekeeping? N  Some recent data might be hidden    Patient Care Team: Biagio Borg, MD as PCP - General  Indicate any recent Medical Services you may have received from other than [facility] providers in the past year (  date may be approximate).     Assessment:   This is a routine wellness examination for Watt.  Hearing/Vision screen Hearing Screening - Comments:: Patient denied any hearing difficulty.   No hearing aids.  Vision Screening - Comments:: Patient wears corrective glasses/contacts.  Eye exam done annually by: [facility]  Dietary issues and exercise activities discussed: Current Exercise Habits: The patient does not participate in regular exercise at present, Exercise limited by: Other - see comments (Dyspnea on Exerction)   Goals Addressed               This Visit's Progress     Patient Stated (pt-stated)        Patient declined health goal at this time.      Depression Screen PHQ 2/9 Scores 01/17/2022 01/17/2022 01/17/2022 05/10/2021 05/10/2021 04/24/2021 12/07/2020  PHQ - 2 Score 0 0 0 0 0 0 0    Fall Risk Fall Risk  01/17/2022 01/17/2022 01/17/2022 05/10/2021 05/10/2021  Falls in the past year? 0 0 0 0 0  Number falls in past yr: 0 0 0 0 0  Injury with Fall? 0 0 0 0 0  Comment - - - - -  Risk for fall due to : No Fall Risks - - - -  Follow up Falls evaluation completed - - - -    FALL RISK PREVENTION PERTAINING TO THE HOME:  Any stairs in or around the home? Yes  If so, are there any without handrails? No  Home free of loose throw rugs in walkways, pet beds, electrical cords,  etc? Yes  Adequate lighting in your home to reduce risk of falls? Yes   ASSISTIVE DEVICES UTILIZED TO PREVENT FALLS:  Life alert? No  Use of a cane, walker or w/c? No  Grab bars in the bathroom? No  Shower chair or bench in shower? Yes  Elevated toilet seat or a handicapped toilet? Yes   TIMED UP AND GO:  Was the test performed? Yes .  Length of time to ambulate 10 feet: 6 sec.   Gait steady and fast without use of assistive device  Cognitive Function: Normal cognitive status assessed by direct observation by this Nurse Health Advisor. No abnormalities found.          Immunizations Immunization History  Administered Date(s) Administered   Fluad Quad(high Dose 65+) 12/04/2020, 01/17/2022   H1N1 01/24/2009   Influenza Split 09/21/2011   Influenza Whole 09/23/2008, 09/23/2009   Influenza, Seasonal, Injecte, Preservative Fre 02/11/2013   Influenza,inj,Quad PF,6+ Mos 01/20/2014, 09/16/2015, 11/06/2019   Influenza-Unspecified 09/22/2016, 10/20/2018   PFIZER(Purple Top)SARS-COV-2 Vaccination 01/25/2020, 02/15/2020, 12/04/2020   Pneumococcal Conjugate-13 11/06/2019   Pneumococcal Polysaccharide-23 12/24/2005, 07/21/2014, 04/24/2021   Td 01/24/2009   Tdap 11/06/2019    TDAP status: Up to date  Flu Vaccine status: Up to date  Pneumococcal vaccine status: Up to date  Covid-19 vaccine status: Completed vaccines  Qualifies for Shingles Vaccine? Yes   Zostavax completed No   Shingrix Completed?: No.    Education has been provided regarding the importance of this vaccine. Patient has been advised to call insurance company to determine out of pocket expense if they have not yet received this vaccine. Advised may also receive vaccine at local pharmacy or Health Dept. Verbalized acceptance and understanding.  Screening Tests Health Maintenance  Topic Date Due   HEMOGLOBIN A1C  10/25/2021   COVID-19 Vaccine (4 - Booster for Pfizer series) 02/02/2022 (Originally 01/29/2021)    COLONOSCOPY (Pts 45-71yr Insurance coverage will  need to be confirmed)  03/23/2022 (Originally 01/15/2022)   Zoster Vaccines- Shingrix (1 of 2) 04/17/2022 (Originally 12/10/2004)   FOOT EXAM  03/22/2022   OPHTHALMOLOGY EXAM  05/26/2022   TETANUS/TDAP  11/05/2029   Pneumonia Vaccine 32+ Years old  Completed   INFLUENZA VACCINE  Completed   Hepatitis C Screening  Completed   HPV VACCINES  Aged Out    Health Maintenance  Health Maintenance Due  Topic Date Due   HEMOGLOBIN A1C  10/25/2021    Colorectal cancer screening: Type of screening: Colonoscopy. Completed 01/15/2017. Repeat every 5 years  Lung Cancer Screening: (Low Dose CT Chest recommended if Age 68-80 years, 30 pack-year currently smoking OR have quit w/in 15years.) does not qualify.   Lung Cancer Screening Referral: no  Additional Screening:  Hepatitis C Screening: does qualify; Completed yes  Vision Screening: Recommended annual ophthalmology exams for early detection of glaucoma and other disorders of the eye. Is the patient up to date with their annual eye exam?  Yes  Who is the provider or what is the name of the office in which the patient attends annual eye exams? [facility] If pt is not established with a provider, would they like to be referred to a provider to establish care? No .   Dental Screening: Recommended annual dental exams for proper oral hygiene  Community Resource Referral / Chronic Care Management: CRR required this visit?  No   CCM required this visit?  No      Plan:     I have personally reviewed and noted the following in the patients chart:   Medical and social history Use of alcohol, tobacco or illicit drugs  Current medications and supplements including opioid prescriptions. Patient is not currently taking opioid prescriptions. Functional ability and status Nutritional status Physical activity Advanced directives List of other physicians Hospitalizations, surgeries, and ER visits in  previous 12 months Vitals Screenings to include cognitive, depression, and falls Referrals and appointments  In addition, I have reviewed and discussed with patient certain preventive protocols, quality metrics, and best practice recommendations. A written personalized care plan for preventive services as well as general preventive health recommendations were provided to patient.     Sheral Flow, LPN   02/11/7587   Nurse Notes:  Hearing Screening - Comments:: Patient denied any hearing difficulty.   No hearing aids.  Vision Screening - Comments:: Patient wears corrective glasses/contacts.  Eye exam done annually by: [facility]

## 2022-01-18 ENCOUNTER — Other Ambulatory Visit: Payer: Self-pay | Admitting: Internal Medicine

## 2022-01-18 MED ORDER — DAPAGLIFLOZIN PROPANEDIOL 5 MG PO TABS: 5.0000 mg | ORAL_TABLET | Freq: Every day | ORAL | 3 refills | Status: DC

## 2022-01-22 ENCOUNTER — Other Ambulatory Visit: Payer: Self-pay | Admitting: Internal Medicine

## 2022-01-22 DIAGNOSIS — E1149 Type 2 diabetes mellitus with other diabetic neurological complication: Secondary | ICD-10-CM

## 2022-01-22 DIAGNOSIS — E538 Deficiency of other specified B group vitamins: Secondary | ICD-10-CM

## 2022-01-22 DIAGNOSIS — E559 Vitamin D deficiency, unspecified: Secondary | ICD-10-CM

## 2022-03-25 ENCOUNTER — Other Ambulatory Visit: Payer: Self-pay | Admitting: Internal Medicine

## 2022-03-25 NOTE — Telephone Encounter (Signed)
Please refill as per office routine med refill policy (all routine meds to be refilled for 3 mo or monthly (per pt preference) up to one year from last visit, then month to month grace period for 3 mo, then further med refills will have to be denied) ? ?

## 2022-04-03 ENCOUNTER — Ambulatory Visit: Payer: Medicare Other | Admitting: Podiatry

## 2022-04-03 DIAGNOSIS — E1149 Type 2 diabetes mellitus with other diabetic neurological complication: Secondary | ICD-10-CM

## 2022-04-03 DIAGNOSIS — Q828 Other specified congenital malformations of skin: Secondary | ICD-10-CM

## 2022-04-03 DIAGNOSIS — M79674 Pain in right toe(s): Secondary | ICD-10-CM

## 2022-04-03 DIAGNOSIS — M79675 Pain in left toe(s): Secondary | ICD-10-CM

## 2022-04-03 DIAGNOSIS — B351 Tinea unguium: Secondary | ICD-10-CM

## 2022-04-03 NOTE — Progress Notes (Signed)
Subjective: ?66 year old male presents the office today for diabetic foot evaluation and for calluses to both of his great toes as well as the toenails he cannot trim himself.  From a neuropathy standpoint he states that the numbness may be getting worse but not having the pain that he was not having as much pain at nighttime.  Still on gabapentin. ? ?Last A1c was 8.3 on January 17, 2022 ? ?Objective: ?AAO x3, NAD ?DP/PT pulses palpable bilaterally, CRT less than 3 seconds ?Nails are hypertrophic, dystrophic, brittle, discolored, elongated ?10. No surrounding redness or drainage. Tenderness nails 1-5 bilaterally.   ?Hyperkeratotic lesions to bilateral hallux and left submetatarsal 2.  Upon debridement no underlying ulceration, drainage or any signs of infection.  ?No other open lesions or pre-ulcerative lesions are identified today.No pain with calf compression, swelling, warmth, erythema ? ?Assessment: ?66 year old male with symptomatic onychomycosis, hyperkeratotic lesions; neuropathy ? ?Plan: ?-All treatment options discussed with the patient including all alternatives, risks, complications.  ?-Nails debrided x10 without any complications or bleeding ?-Hyperkeratotic lesion sharply treated x3 without complications ?-Continue gabapentin.  Monitor neuropathy symptoms.  ?-Discussed daily foot inspection, glucose control ? ?Trula Slade DPM ?

## 2022-04-13 ENCOUNTER — Encounter: Payer: Self-pay | Admitting: Internal Medicine

## 2022-04-19 ENCOUNTER — Other Ambulatory Visit: Payer: Self-pay | Admitting: Internal Medicine

## 2022-04-20 NOTE — Telephone Encounter (Signed)
Please refill as per office routine med refill policy (all routine meds to be refilled for 3 mo or monthly (per pt preference) up to one year from last visit, then month to month grace period for 3 mo, then further med refills will have to be denied) ? ?

## 2022-05-18 ENCOUNTER — Other Ambulatory Visit: Payer: Self-pay | Admitting: Internal Medicine

## 2022-05-18 DIAGNOSIS — I10 Essential (primary) hypertension: Secondary | ICD-10-CM

## 2022-05-18 NOTE — Telephone Encounter (Signed)
Please refill as per office routine med refill policy (all routine meds to be refilled for 3 mo or monthly (per pt preference) up to one year from last visit, then month to month grace period for 3 mo, then further med refills will have to be denied) ? ?

## 2022-06-04 ENCOUNTER — Other Ambulatory Visit: Payer: Self-pay | Admitting: Internal Medicine

## 2022-06-04 NOTE — Telephone Encounter (Signed)
Please refill as per office routine med refill policy (all routine meds to be refilled for 3 mo or monthly (per pt preference) up to one year from last visit, then month to month grace period for 3 mo, then further med refills will have to be denied) ? ?

## 2022-06-20 ENCOUNTER — Other Ambulatory Visit: Payer: Self-pay | Admitting: Podiatry

## 2022-07-03 ENCOUNTER — Ambulatory Visit: Payer: Medicare Other | Admitting: Podiatry

## 2022-07-03 DIAGNOSIS — Q828 Other specified congenital malformations of skin: Secondary | ICD-10-CM

## 2022-07-03 DIAGNOSIS — M79675 Pain in left toe(s): Secondary | ICD-10-CM

## 2022-07-03 DIAGNOSIS — M79674 Pain in right toe(s): Secondary | ICD-10-CM | POA: Diagnosis not present

## 2022-07-03 DIAGNOSIS — E1149 Type 2 diabetes mellitus with other diabetic neurological complication: Secondary | ICD-10-CM

## 2022-07-03 DIAGNOSIS — B351 Tinea unguium: Secondary | ICD-10-CM | POA: Diagnosis not present

## 2022-07-03 NOTE — Progress Notes (Signed)
Subjective: 66 year old male presents the office today for diabetic foot evaluation and for calluses to both of his great toes as well as the toenails he cannot trim himself.  Still taking gabapentin for neuropathy.  He is on gabapentin he takes 1 tablet during the week and 2 on the weekends as it does cause him to sleep.  No recent injury or falls.  Last A1c was 8.3 on January 17, 2022  Objective: AAO x3, NAD DP/PT pulses palpable bilaterally, CRT less than 3 seconds Nails are hypertrophic, dystrophic, brittle, discolored, elongated 10. No surrounding redness or drainage. Tenderness nails 1-5 bilaterally.   Hyperkeratotic lesions to bilateral hallux and left submetatarsal 2.  Upon debridement no underlying ulceration, drainage or any signs of infection.  No other open lesions or pre-ulcerative lesions are identified today.No pain with calf compression, swelling, warmth, erythema  Assessment: 66 year old male with symptomatic onychomycosis, hyperkeratotic lesions; neuropathy  Plan: -All treatment options discussed with the patient including all alternatives, risks, complications.  -Nails debrided x10 without any complications or bleeding -Hyperkeratotic lesion sharply treated x3 without complications -Continue gabapentin.  Monitor neuropathy symptoms.  -Discussed daily foot inspection, glucose control  Trula Slade DPM

## 2022-07-17 ENCOUNTER — Other Ambulatory Visit (INDEPENDENT_AMBULATORY_CARE_PROVIDER_SITE_OTHER): Payer: Medicare Other

## 2022-07-17 DIAGNOSIS — E1149 Type 2 diabetes mellitus with other diabetic neurological complication: Secondary | ICD-10-CM

## 2022-07-17 DIAGNOSIS — E538 Deficiency of other specified B group vitamins: Secondary | ICD-10-CM | POA: Diagnosis not present

## 2022-07-17 DIAGNOSIS — E559 Vitamin D deficiency, unspecified: Secondary | ICD-10-CM

## 2022-07-17 LAB — BASIC METABOLIC PANEL
BUN: 18 mg/dL (ref 6–23)
CO2: 28 mEq/L (ref 19–32)
Calcium: 9.6 mg/dL (ref 8.4–10.5)
Chloride: 101 mEq/L (ref 96–112)
Creatinine, Ser: 0.8 mg/dL (ref 0.40–1.50)
GFR: 91.52 mL/min (ref >60.00)
Glucose, Bld: 211 mg/dL — ABNORMAL HIGH (ref 70–99)
Potassium: 4.1 mEq/L (ref 3.5–5.1)
Sodium: 139 mEq/L (ref 135–145)

## 2022-07-17 LAB — HEPATIC FUNCTION PANEL
ALT: 26 U/L (ref 0–53)
AST: 19 U/L (ref 0–37)
Albumin: 4 g/dL (ref 3.5–5.2)
Alkaline Phosphatase: 41 U/L (ref 39–117)
Bilirubin, Direct: 0 mg/dL (ref 0.0–0.3)
Total Bilirubin: 0.3 mg/dL (ref 0.2–1.2)
Total Protein: 6.5 g/dL (ref 6.0–8.3)

## 2022-07-17 LAB — HEMOGLOBIN A1C: Hgb A1c MFr Bld: 8.5 % — ABNORMAL HIGH (ref 4.6–6.5)

## 2022-07-17 LAB — LIPID PANEL
Cholesterol: 133 mg/dL (ref 0–200)
HDL: 30.3 mg/dL — ABNORMAL LOW (ref >39.00)
NonHDL: 102.57
Total CHOL/HDL Ratio: 4
Triglycerides: 217 mg/dL — ABNORMAL HIGH (ref 0.0–149.0)
VLDL: 43.4 mg/dL — ABNORMAL HIGH (ref 0.0–40.0)

## 2022-07-17 LAB — VITAMIN D 25 HYDROXY (VIT D DEFICIENCY, FRACTURES): VITD: 34.67 ng/mL (ref 30.00–100.00)

## 2022-07-17 LAB — LDL CHOLESTEROL, DIRECT: Direct LDL: 72 mg/dL

## 2022-07-17 LAB — VITAMIN B12: Vitamin B-12: 777 pg/mL (ref 211–911)

## 2022-07-20 ENCOUNTER — Ambulatory Visit (INDEPENDENT_AMBULATORY_CARE_PROVIDER_SITE_OTHER): Payer: Medicare Other | Admitting: Internal Medicine

## 2022-07-20 ENCOUNTER — Encounter: Payer: Self-pay | Admitting: Internal Medicine

## 2022-07-20 VITALS — BP 112/60 | HR 62 | Temp 98.2°F | Ht 72.0 in | Wt 293.0 lb

## 2022-07-20 DIAGNOSIS — I10 Essential (primary) hypertension: Secondary | ICD-10-CM

## 2022-07-20 DIAGNOSIS — E782 Mixed hyperlipidemia: Secondary | ICD-10-CM

## 2022-07-20 DIAGNOSIS — Z86010 Personal history of colonic polyps: Secondary | ICD-10-CM | POA: Diagnosis not present

## 2022-07-20 DIAGNOSIS — E538 Deficiency of other specified B group vitamins: Secondary | ICD-10-CM | POA: Diagnosis not present

## 2022-07-20 DIAGNOSIS — E1149 Type 2 diabetes mellitus with other diabetic neurological complication: Secondary | ICD-10-CM | POA: Diagnosis not present

## 2022-07-20 DIAGNOSIS — E559 Vitamin D deficiency, unspecified: Secondary | ICD-10-CM | POA: Diagnosis not present

## 2022-07-20 DIAGNOSIS — Z125 Encounter for screening for malignant neoplasm of prostate: Secondary | ICD-10-CM

## 2022-07-20 MED ORDER — ROSUVASTATIN CALCIUM 40 MG PO TABS: 40.0000 mg | ORAL_TABLET | Freq: Every day | ORAL | 3 refills | Status: DC

## 2022-07-20 MED ORDER — OZEMPIC (0.25 OR 0.5 MG/DOSE) 2 MG/3ML ~~LOC~~ SOPN: PEN_INJECTOR | SUBCUTANEOUS | 3 refills | Status: DC

## 2022-07-20 NOTE — Progress Notes (Unsigned)
Patient ID: IBN STIEF, male   DOB: 01/07/1956, 66 y.o.   MRN: [id number]        Chief Complaint: follow up dm, htn, hld, low vit d       HPI:  Leon Nelson is a 66 y.o. male here overall doing ok, Pt denies chest pain, increased sob or doe, wheezing, orthopnea, PND, increased LE swelling, palpitations, dizziness or syncope.   Pt denies polydipsia, polyuria, or new focal neuro s/s.    Pt denies fever, wt loss, night sweats, loss of appetite, or other constitutional symptoms   Wt had gotten to 279 x 1 yr ago with A1c 6.3 at home per [facility] insurance home exam - all with palio diet, peak wt has been 357, but has gained now to 293.  Not taking Vit D       Wt Readings from Last 3 Encounters:  07/20/22 293 lb (132.9 kg)  01/17/22 296 lb 1.2 oz (134.3 kg)  01/17/22 296 lb (134.3 kg)   BP Readings from Last 3 Encounters:  07/20/22 112/60  01/17/22 120/84  01/17/22 120/84         Past Medical History:  Diagnosis Date   ABDOMINAL PAIN OTHER SPECIFIED SITE 07/20/2008   CHEST PAIN 02/06/2011   COLONIC POLYPS, HX OF 05/26/2008   DEPRESSION 05/26/2008   DIABETES MELLITUS, TYPE II 05/26/2008   HYPERLIPIDEMIA 05/26/2008   HYPERTENSION 05/26/2008   HYPOTHYROIDISM 05/26/2008   KNEE PAIN, RIGHT, ACUTE 07/01/2008   Personal history of urinary calculi 05/26/2008   UTI'S, HX OF 05/26/2008   Past Surgical History:  Procedure Laterality Date   s/p right knee arthroplasty      complicated by patellar tendon rupture Jan 2011 Dr. Gladstone Lighter   TONSILLECTOMY      reports that he has never smoked. He has never used smokeless tobacco. He reports that he does not drink alcohol and does not use drugs. family history includes Alcohol abuse in an other family member; Cancer in his mother; Diabetes in an other family member; Heart disease in his father and mother; Hyperlipidemia in his father and mother. Allergies  Allergen Reactions   Penicillins Other (See Comments)    Blisters on hands   Sulfonamide Derivatives Other (See  Comments)    Blisters in groin area   Current Outpatient Medications on File Prior to Visit  Medication Sig Dispense Refill   allopurinol (ZYLOPRIM) 300 MG tablet TAKE 1 TABLET BY MOUTH DAILY 90 tablet 1   atenolol-chlorthalidone (TENORETIC) 50-25 MG tablet TAKE ONE-HALF TABLET BY MOUTH  DAILY 45 tablet 2   Blood Glucose Monitoring Suppl (ONE TOUCH ULTRA 2) w/Device KIT Use as directed once daily E11.9 1 each 0   dapagliflozin propanediol (FARXIGA) 5 MG TABS tablet Take 1 tablet (5 mg total) by mouth daily before breakfast. 90 tablet 3   diazepam (VALIUM) 5 MG tablet 1 tab by mouth at 45 min prior to procedure 1 tablet 0   diclofenac sodium (VOLTAREN) 1 % GEL Apply 2 g topically 4 (four) times daily as needed. 200 g 5   dicyclomine (BENTYL) 20 MG tablet TAKE 1 TABLET BY MOUTH 3  TIMES DAILY AS NEEDED 270 tablet 2   gabapentin (NEURONTIN) 300 MG capsule TAKE 1 CAPSULE BY MOUTH  TWICE DAILY 180 capsule 3   glipiZIDE (GLUCOTROL XL) 10 MG 24 hr tablet TAKE 1 TABLET BY MOUTH DAILY  WITH BREAKFAST 90 tablet 2   glucose blood (ONE TOUCH ULTRA TEST) test strip Use as  instructed once daily E11.9 100 each 12   Lancets MISC Use as directed once daily E11.9 100 each 3   levothyroxine (SYNTHROID) 50 MCG tablet TAKE 1 TABLET BY MOUTH DAILY 90 tablet 3   lisinopril (ZESTRIL) 20 MG tablet TAKE 1 TABLET BY MOUTH  DAILY 100 tablet 2   metFORMIN (GLUCOPHAGE-XR) 500 MG 24 hr tablet TAKE 1 TABLET BY MOUTH  DAILY WITH BREAKFAST 90 tablet 2   potassium chloride SA (KLOR-CON M) 20 MEQ tablet TAKE 2 TABLETS BY MOUTH  DAILY 180 tablet 2   simvastatin (ZOCOR) 80 MG tablet TAKE 1 TABLET BY MOUTH AT  BEDTIME 90 tablet 2   venlafaxine XR (EFFEXOR-XR) 75 MG 24 hr capsule TAKE 1 CAPSULE BY MOUTH DAILY 90 capsule 2   vitamin B-12 (CYANOCOBALAMIN) 1000 MCG tablet Take 1 tablet (1,000 mcg total) by mouth daily. 90 tablet 3   No current facility-administered medications on file prior to visit.        ROS:  All others reviewed  and negative.  Objective        PE:  BP 112/60 (BP Location: Right Arm, Patient Position: Sitting, Cuff Size: Large)   Pulse 62   Temp 98.2 F (36.8 C) (Oral)   Ht 6' (1.829 m)   Wt 293 lb (132.9 kg)   SpO2 94%   BMI 39.74 kg/m                 Constitutional: Pt appears in NAD               HENT: Head: NCAT.                Right Ear: External ear normal.                 Left Ear: External ear normal.                Eyes: . Pupils are equal, round, and reactive to light. Conjunctivae and EOM are normal               Nose: without d/c or deformity               Neck: Neck supple. Gross normal ROM               Cardiovascular: Normal rate and regular rhythm.                 Pulmonary/Chest: Effort normal and breath sounds without rales or wheezing.                Abd:  Soft, NT, ND, + BS, no organomegaly               Neurological: Pt is alert. At baseline orientation, motor grossly intact               Skin: Skin is warm. No rashes, no other new lesions, LE edema - chronic 1+               Psychiatric: Pt behavior is normal without agitation   Micro: none  Cardiac tracings I have personally interpreted today:  none  Pertinent Radiological findings (summarize): none   Lab Results  Component Value Date   WBC 7.7 01/17/2022   HGB 14.5 01/17/2022   HCT 43.6 01/17/2022   PLT 208.0 01/17/2022   GLUCOSE 211 (H) 07/17/2022   CHOL 133 07/17/2022   TRIG 217.0 (H) 07/17/2022   HDL 30.30 (L) 07/17/2022   LDLDIRECT  72.0 07/17/2022   LDLCALC 80 01/17/2022   ALT 26 07/17/2022   AST 19 07/17/2022   NA 139 07/17/2022   K 4.1 07/17/2022   CL 101 07/17/2022   CREATININE 0.80 07/17/2022   BUN 18 07/17/2022   CO2 28 07/17/2022   TSH 3.45 01/17/2022   PSA 0.07 (L) 01/17/2022   INR 2.0 ratio (H) 01/16/2010   HGBA1C 8.5 (H) 07/17/2022   MICROALBUR 1.6 01/17/2022   Assessment/Plan:  Leon Nelson is a 66 y.o. White or Caucasian [1] male with  has a past medical history of  ABDOMINAL PAIN OTHER SPECIFIED SITE (07/20/2008), CHEST PAIN (02/06/2011), COLONIC POLYPS, HX OF (05/26/2008), DEPRESSION (05/26/2008), DIABETES MELLITUS, TYPE II (05/26/2008), HYPERLIPIDEMIA (05/26/2008), HYPERTENSION (05/26/2008), HYPOTHYROIDISM (05/26/2008), KNEE PAIN, RIGHT, ACUTE (07/01/2008), Personal history of urinary calculi (05/26/2008), and UTI'S, HX OF (05/26/2008).  Vitamin D deficiency Last vitamin D Lab Results  Component Value Date   VD25OH 34.67 07/17/2022   Low, to start oral replacement   Hyperlipidemia Lab Results  Component Value Date   [place] 80 01/17/2022   Uncontrolled, goal ldl < 70, pt to change current zocor to crestor 40 mg qd   Essential hypertension BP Readings from Last 3 Encounters:  07/20/22 112/60  01/17/22 120/84  01/17/22 120/84   Stable, pt to continue medical treatment tenoretic 50-25 mg qd, lisinopril 20 qd   Diabetes mellitus type 2 with neurological manifestations ([place]) Lab Results  Component Value Date   HGBA1C 8.5 (H) 07/17/2022   Uncontrolled with obesity, pt to continue current medical treatment farxiga 5 mg, glipzide er 10 mg, metformin ER 500 - 1qd , and add ozempic 0.5 mg weekly  Followup: Return in about 6 months (around 01/20/2023).  [name], MD 07/21/2022 9:51 PM [facility]

## 2022-07-20 NOTE — Patient Instructions (Signed)
Please take all new medication as prescribed  - the ozempic and the crestor   Ok to stop the zocor when starting the crestor  Please take OTC Vitamin D3 at 2000 units per day, indefinitely  Please continue all other medications as before, and refills have been done if requested.  Please have the pharmacy call with any other refills you may need.  Please continue your efforts at being more active, low cholesterol diet, and weight control.  Please keep your appointments with your specialists as you may have planned  Please make an Appointment to return in 6 months, or sooner if needed, also with Lab Appointment for testing done 3-5 days before at the [facility] (so this is for TWO appointments - please see the scheduling desk as you leave)

## 2022-07-21 ENCOUNTER — Encounter: Payer: Self-pay | Admitting: Internal Medicine

## 2022-07-21 NOTE — Assessment & Plan Note (Signed)
Lab Results  Component Value Date   LDLCALC 80 01/17/2022   Uncontrolled, goal ldl < 70, pt to change current zocor to crestor 40 mg qd

## 2022-07-21 NOTE — Assessment & Plan Note (Signed)
Last vitamin D Lab Results  Component Value Date   VD25OH 34.67 07/17/2022   Low, to start oral replacement

## 2022-07-21 NOTE — Assessment & Plan Note (Signed)
Lab Results  Component Value Date   HGBA1C 8.5 (H) 07/17/2022   Uncontrolled with obesity, pt to continue current medical treatment farxiga 5 mg, glipzide er 10 mg, metformin ER 500 - 1qd , and add ozempic 0.5 mg weekly

## 2022-07-21 NOTE — Assessment & Plan Note (Signed)
BP Readings from Last 3 Encounters:  07/20/22 112/60  01/17/22 120/84  01/17/22 120/84   Stable, pt to continue medical treatment tenoretic 50-25 mg qd, lisinopril 20 qd

## 2022-07-23 ENCOUNTER — Other Ambulatory Visit: Payer: Self-pay | Admitting: Internal Medicine

## 2022-07-23 NOTE — Telephone Encounter (Signed)
Please refill as per office routine med refill policy (all routine meds to be refilled for 3 mo or monthly (per pt preference) up to one year from last visit, then month to month grace period for 3 mo, then further med refills will have to be denied) ? ?

## 2022-08-04 ENCOUNTER — Other Ambulatory Visit: Payer: Self-pay | Admitting: Internal Medicine

## 2022-08-04 NOTE — Telephone Encounter (Signed)
Please refill as per office routine med refill policy (all routine meds to be refilled for 3 mo or monthly (per pt preference) up to one year from last visit, then month to month grace period for 3 mo, then further med refills will have to be denied) ? ?

## 2022-08-29 ENCOUNTER — Ambulatory Visit (INDEPENDENT_AMBULATORY_CARE_PROVIDER_SITE_OTHER): Payer: Medicare Other | Admitting: Family Medicine

## 2022-08-29 ENCOUNTER — Encounter: Payer: Self-pay | Admitting: Family Medicine

## 2022-08-29 VITALS — BP 132/72 | HR 59 | Temp 97.6°F | Ht 72.0 in | Wt 293.0 lb

## 2022-08-29 DIAGNOSIS — M549 Dorsalgia, unspecified: Secondary | ICD-10-CM | POA: Diagnosis not present

## 2022-08-29 DIAGNOSIS — R1010 Upper abdominal pain, unspecified: Secondary | ICD-10-CM

## 2022-08-29 DIAGNOSIS — M25511 Pain in right shoulder: Secondary | ICD-10-CM | POA: Diagnosis not present

## 2022-08-29 DIAGNOSIS — R194 Change in bowel habit: Secondary | ICD-10-CM

## 2022-08-29 DIAGNOSIS — R8290 Unspecified abnormal findings in urine: Secondary | ICD-10-CM

## 2022-08-29 LAB — CBC WITH DIFFERENTIAL/PLATELET
Basophils Absolute: 0.1 10*3/uL (ref 0.0–0.1)
Basophils Relative: 1 % (ref 0.0–3.0)
Eosinophils Absolute: 0.4 10*3/uL (ref 0.0–0.7)
Eosinophils Relative: 5.6 % — ABNORMAL HIGH (ref 0.0–5.0)
HCT: 47 % (ref 39.0–52.0)
Hemoglobin: 15.8 g/dL (ref 13.0–17.0)
Lymphocytes Relative: 31.6 % (ref 12.0–46.0)
Lymphs Abs: 2.5 10*3/uL (ref 0.7–4.0)
MCHC: 33.7 g/dL (ref 30.0–36.0)
MCV: 88.1 fl (ref 78.0–100.0)
Monocytes Absolute: 0.6 10*3/uL (ref 0.1–1.0)
Monocytes Relative: 6.9 % (ref 3.0–12.0)
Neutro Abs: 4.4 10*3/uL (ref 1.4–7.7)
Neutrophils Relative %: 54.9 % (ref 43.0–77.0)
Platelets: 204 10*3/uL (ref 150.0–400.0)
RBC: 5.34 Mil/uL (ref 4.22–5.81)
RDW: 13.5 % (ref 11.5–15.5)
WBC: 8 10*3/uL (ref 4.0–10.5)

## 2022-08-29 LAB — COMPREHENSIVE METABOLIC PANEL
ALT: 31 U/L (ref 0–53)
AST: 27 U/L (ref 0–37)
Albumin: 4 g/dL (ref 3.5–5.2)
Alkaline Phosphatase: 42 U/L (ref 39–117)
BUN: 22 mg/dL (ref 6–23)
CO2: 26 mEq/L (ref 19–32)
Calcium: 9.3 mg/dL (ref 8.4–10.5)
Chloride: 102 mEq/L (ref 96–112)
Creatinine, Ser: 0.77 mg/dL (ref 0.40–1.50)
GFR: 92.5 mL/min (ref >60.00)
Glucose, Bld: 160 mg/dL — ABNORMAL HIGH (ref 70–99)
Potassium: 3.9 mEq/L (ref 3.5–5.1)
Sodium: 139 mEq/L (ref 135–145)
Total Bilirubin: 0.5 mg/dL (ref 0.2–1.2)
Total Protein: 7.1 g/dL (ref 6.0–8.3)

## 2022-08-29 LAB — LIPASE: Lipase: 11 U/L (ref 11.0–59.0)

## 2022-08-29 LAB — POCT URINALYSIS DIPSTICK
Bilirubin, UA: NEGATIVE
Blood, UA: NEGATIVE
Glucose, UA: POSITIVE — AB
Ketones, UA: NEGATIVE
Leukocytes, UA: NEGATIVE
Nitrite, UA: NEGATIVE
Protein, UA: NEGATIVE
Spec Grav, UA: 1.02 (ref 1.010–1.025)
Urobilinogen, UA: 0.2 E.U./dL
pH, UA: 6 (ref 5.0–8.0)

## 2022-08-29 NOTE — Progress Notes (Signed)
Subjective:     Patient ID: Leon Nelson, male    DOB: 11/03/1956, 66 y.o.   MRN: [id number]  Chief Complaint  Patient presents with   Abdominal Pain    Stomach pain has been going on since visit with Dr. Jenny Reichmann but reports this past Friday pain has started to radiate to his midline back. Also reports stool is really light colored, almost white along with some mucus and isn't sure if that is normal.  Is worried about pancreas.     HPI Patient is in today for a 3-5 month history of upper abdominal pain. Pain is dull, rates it a 4-5/10. The pain moves from right to left at times. Pain started in his back over the past week or so and is intermittent. Right shoulder has been hurting off and on as well.  Decreased appetite. No weight loss.   He has taken Advil and it helps.   States his urine smells different. No other urinary symptoms.   Also reports bowel changes recently, occasional loose stools with lightened color. No melena or hematochezia.  Scheduled for colonoscopy in early October.   Denies fever, chills, dizziness, chest pain, palpitations, shortness of breath, nausea, vomiting, or early satiety.     Health Maintenance Due  Topic Date Due   COLONOSCOPY (Pts 45-17yr Insurance coverage will need to be confirmed)  01/15/2022    Past Medical History:  Diagnosis Date   ABDOMINAL PAIN OTHER SPECIFIED SITE 07/20/2008   CHEST PAIN 02/06/2011   COLONIC POLYPS, HX OF 05/26/2008   DEPRESSION 05/26/2008   DIABETES MELLITUS, TYPE II 05/26/2008   HYPERLIPIDEMIA 05/26/2008   HYPERTENSION 05/26/2008   HYPOTHYROIDISM 05/26/2008   KNEE PAIN, RIGHT, ACUTE 07/01/2008   Personal history of urinary calculi 05/26/2008   UTI'S, HX OF 05/26/2008    Past Surgical History:  Procedure Laterality Date   s/p right knee arthroplasty      complicated by patellar tendon rupture Jan 2011 Dr. gGladstone Lighter  TONSILLECTOMY      Family History  Problem Relation Age of Onset   Cancer Mother        breast cancer    Hyperlipidemia Mother    Heart disease Mother    Hyperlipidemia Father    Heart disease Father    Alcohol abuse Other    Diabetes Other     Social History   Socioeconomic History   Marital status: Married    Spouse name: Not on file   Number of children: Not on file   Years of education: Not on file   Highest education level: Not on file  Occupational History   Not on file  Tobacco Use   Smoking status: Never   Smokeless tobacco: Never  Substance and Sexual Activity   Alcohol use: No   Drug use: No   Sexual activity: Not on file  Other Topics Concern   Not on file  Social History Narrative   Not on file   Social Determinants of Health   Financial Resource Strain: Low Risk  (01/17/2022)   Overall Financial Resource Strain (CARDIA)    Difficulty of Paying Living Expenses: Not hard at all  Food Insecurity: No Food Insecurity (01/17/2022)   Hunger Vital Sign    Worried About Running Out of Food in the Last Year: Never true    Ran Out of Food in the Last Year: Never true  Transportation Needs: No Transportation Needs (01/17/2022)   PRAPARE - Transportation    Lack  of Transportation (Medical): No    Lack of Transportation (Non-Medical): No  Physical Activity: Inactive (01/17/2022)   Exercise Vital Sign    Days of Exercise per Week: 0 days    Minutes of Exercise per Session: 0 min  Stress: No Stress Concern Present (01/17/2022)   [place]    Feeling of Stress : Not at all  Social Connections: [place] (01/17/2022)   Social Connection and Isolation Panel [NHANES]    Frequency of Communication with Friends and Family: More than three times a week    Frequency of Social Gatherings with Friends and Family: Once a week    Attends Religious Services: More than 4 times per year    Active Member of [employer] or Organizations: No    Attends Music therapist: More than 4 times per year    Marital  Status: Married  Human resources officer Violence: Not At Risk (01/17/2022)   Humiliation, Afraid, Rape, and Kick questionnaire    Fear of Current or Ex-Partner: No    Emotionally Abused: No    Physically Abused: No    Sexually Abused: No    Outpatient Medications Prior to Visit  Medication Sig Dispense Refill   allopurinol (ZYLOPRIM) 300 MG tablet TAKE 1 TABLET BY MOUTH DAILY 100 tablet 2   atenolol-chlorthalidone (TENORETIC) 50-25 MG tablet TAKE ONE-HALF TABLET BY MOUTH  DAILY 45 tablet 2   Blood Glucose Monitoring Suppl (ONE TOUCH ULTRA 2) w/Device KIT Use as directed once daily E11.9 1 each 0   dapagliflozin propanediol (FARXIGA) 5 MG TABS tablet Take 1 tablet (5 mg total) by mouth daily before breakfast. 90 tablet 3   diazepam (VALIUM) 5 MG tablet 1 tab by mouth at 45 min prior to procedure 1 tablet 0   diclofenac sodium (VOLTAREN) 1 % GEL Apply 2 g topically 4 (four) times daily as needed. 200 g 5   dicyclomine (BENTYL) 20 MG tablet TAKE 1 TABLET BY MOUTH 3  TIMES DAILY AS NEEDED 270 tablet 2   gabapentin (NEURONTIN) 300 MG capsule TAKE 1 CAPSULE BY MOUTH  TWICE DAILY 180 capsule 3   glipiZIDE (GLUCOTROL XL) 10 MG 24 hr tablet TAKE 1 TABLET BY MOUTH DAILY  WITH BREAKFAST 90 tablet 2   glucose blood (ONE TOUCH ULTRA TEST) test strip Use as instructed once daily E11.9 100 each 12   Lancets MISC Use as directed once daily E11.9 100 each 3   levothyroxine (SYNTHROID) 50 MCG tablet TAKE 1 TABLET BY MOUTH DAILY 90 tablet 3   lisinopril (ZESTRIL) 20 MG tablet TAKE 1 TABLET BY MOUTH  DAILY 100 tablet 2   metFORMIN (GLUCOPHAGE-XR) 500 MG 24 hr tablet TAKE 1 TABLET BY MOUTH  DAILY WITH BREAKFAST 90 tablet 2   potassium chloride SA (KLOR-CON M) 20 MEQ tablet TAKE 2 TABLETS BY MOUTH  DAILY 180 tablet 2   Semaglutide,0.25 or 0.5MG/DOS, (OZEMPIC, 0.25 OR 0.5 MG/DOSE,) 2 MG/3ML SOPN Take 0.5 mg subq weekly 9 mL 3   simvastatin (ZOCOR) 80 MG tablet TAKE 1 TABLET BY MOUTH AT  BEDTIME 90 tablet 2    venlafaxine XR (EFFEXOR-XR) 75 MG 24 hr capsule TAKE 1 CAPSULE BY MOUTH DAILY 90 capsule 2   vitamin B-12 (CYANOCOBALAMIN) 1000 MCG tablet Take 1 tablet (1,000 mcg total) by mouth daily. 90 tablet 3   rosuvastatin (CRESTOR) 40 MG tablet Take 1 tablet (40 mg total) by mouth daily. (Patient not taking: Reported on 08/29/2022) 90  tablet 3   No facility-administered medications prior to visit.    Allergies  Allergen Reactions   Penicillins Other (See Comments)    Blisters on hands   Sulfonamide Derivatives Other (See Comments)    Blisters in groin area    ROS     Objective:    Physical Exam Constitutional:      General: He is not in acute distress.    Appearance: He is not ill-appearing.  HENT:     Mouth/Throat:     Mouth: Mucous membranes are moist.  Eyes:     Extraocular Movements: Extraocular movements intact.     Pupils: Pupils are equal, round, and reactive to light.  Cardiovascular:     Rate and Rhythm: Normal rate and regular rhythm.  Pulmonary:     Effort: Pulmonary effort is normal.     Breath sounds: Normal breath sounds.  Abdominal:     General: Bowel sounds are normal.     Palpations: Abdomen is soft.     Tenderness: There is no abdominal tenderness. There is no right CVA tenderness, left CVA tenderness, guarding or rebound. Negative signs include Murphy's sign and McBurney's sign.  Skin:    General: Skin is warm and dry.     Findings: No rash.  Neurological:     General: No focal deficit present.     Mental Status: He is alert and oriented to person, place, and time.  Psychiatric:        Mood and Affect: Mood normal.        Behavior: Behavior normal.     BP 132/72 (BP Location: Left Arm, Patient Position: Sitting, Cuff Size: Large)   Pulse (!) 59   Temp 97.6 F (36.4 C) (Temporal)   Ht 6' (1.829 m)   Wt 293 lb (132.9 kg)   SpO2 96%   BMI 39.74 kg/m  Wt Readings from Last 3 Encounters:  08/29/22 293 lb (132.9 kg)  07/20/22 293 lb (132.9 kg)   01/17/22 296 lb 1.2 oz (134.3 kg)       Assessment & Plan:   Problem List Items Addressed This Visit   None Visit Diagnoses     Intermittent upper abdominal pain    -  Primary   Relevant Orders   CBC with Differential/Platelet (Completed)   Comprehensive metabolic panel (Completed)   EKG 12-Lead   Lipase (Completed)   Bowel habit changes       Other acute back pain       Relevant Orders   POCT urinalysis dipstick (Completed)   Acute pain of right shoulder       Malodorous urine       Relevant Orders   POCT urinalysis dipstick (Completed)      Urinalysis dipstick +glucose (Farxiga) and negative otherwise.  EKG shows sinus bradycardia. No acute changes.  Ongoing intermittent pain without any obvious etiology or red flag symptoms.  Reviewed labs and abdomina US, CT, MRI with patient from 2022. No aneurysm or other abnormalities other than mild fatty liver.  He has upcoming GI appt and colonoscopy scheduled. No sign of infectious process. Back and shoulder pain appear to be MSK related.  Will check labs and follow up.   I am having Malikiah L. Berline Lopes maintain his diclofenac sodium, ONE TOUCH ULTRA 2, glucose blood, Lancets, cyanocobalamin, diazepam, dapagliflozin propanediol, dicyclomine, glipiZIDE, metFORMIN, potassium chloride SA, simvastatin, venlafaxine XR, levothyroxine, atenolol-chlorthalidone, lisinopril, gabapentin, Ozempic (0.25 or 0.5 MG/DOSE), rosuvastatin, and allopurinol.  No orders of the  defined types were placed in this encounter.

## 2022-08-29 NOTE — Patient Instructions (Signed)
Please go downstairs for labs before you leave today.   Please keep an eye on any triggers for your pain or any changes or  new symptoms that we did not discuss today.   You may try over the counter Pepcid once or twice daily to see if this help with your symptoms.   We will be in touch with your lab results.

## 2022-09-12 DIAGNOSIS — D1801 Hemangioma of skin and subcutaneous tissue: Secondary | ICD-10-CM | POA: Diagnosis not present

## 2022-09-12 DIAGNOSIS — L218 Other seborrheic dermatitis: Secondary | ICD-10-CM | POA: Diagnosis not present

## 2022-09-12 DIAGNOSIS — L821 Other seborrheic keratosis: Secondary | ICD-10-CM | POA: Diagnosis not present

## 2022-09-12 DIAGNOSIS — L710 Perioral dermatitis: Secondary | ICD-10-CM | POA: Diagnosis not present

## 2022-09-12 DIAGNOSIS — D485 Neoplasm of uncertain behavior of skin: Secondary | ICD-10-CM | POA: Diagnosis not present

## 2022-09-12 DIAGNOSIS — C44319 Basal cell carcinoma of skin of other parts of face: Secondary | ICD-10-CM | POA: Diagnosis not present

## 2022-10-02 ENCOUNTER — Ambulatory Visit: Payer: Medicare Other | Admitting: Podiatry

## 2022-10-03 ENCOUNTER — Other Ambulatory Visit: Payer: Self-pay | Admitting: Internal Medicine

## 2022-10-03 NOTE — Telephone Encounter (Signed)
Please refill as per office routine med refill policy (all routine meds to be refilled for 3 mo or monthly (per pt preference) up to one year from last visit, then month to month grace period for 3 mo, then further med refills will have to be denied) ? ?

## 2022-10-17 DIAGNOSIS — C44319 Basal cell carcinoma of skin of other parts of face: Secondary | ICD-10-CM | POA: Diagnosis not present

## 2022-10-23 ENCOUNTER — Ambulatory Visit: Payer: Medicare Other | Admitting: Podiatry

## 2022-10-23 DIAGNOSIS — B351 Tinea unguium: Secondary | ICD-10-CM

## 2022-10-23 DIAGNOSIS — M79675 Pain in left toe(s): Secondary | ICD-10-CM

## 2022-10-23 DIAGNOSIS — M79674 Pain in right toe(s): Secondary | ICD-10-CM

## 2022-10-23 DIAGNOSIS — Q828 Other specified congenital malformations of skin: Secondary | ICD-10-CM | POA: Diagnosis not present

## 2022-10-23 DIAGNOSIS — E1149 Type 2 diabetes mellitus with other diabetic neurological complication: Secondary | ICD-10-CM | POA: Diagnosis not present

## 2022-10-24 DIAGNOSIS — L905 Scar conditions and fibrosis of skin: Secondary | ICD-10-CM | POA: Diagnosis not present

## 2022-10-29 NOTE — Progress Notes (Signed)
Subjective: Chief Complaint  Patient presents with   Diabetes    Diabetic foot care, Nail trim and callus, A1c- 8.1 BG- Not taking     66 year old male presents the office today for diabetic foot evaluation and for calluses to both of his great toes as well as the toenails he cannot trim himself.  Still taking gabapentin for neuropathy.  Open wounds that he reports.  No recent injury or changes.  Last A1c was 8.5 on J7/25/2023  Objective: AAO x3, NAD DP/PT pulses palpable bilaterally, CRT less than 3 seconds Nails are hypertrophic, dystrophic, brittle, discolored, elongated 10. No surrounding redness or drainage. Tenderness nails 1-5 bilaterally.   Hyperkeratotic lesions to bilateral hallux and left submetatarsal 2.  Upon debridement no underlying ulceration, drainage or any signs of infection.  No other open lesions or pre-ulcerative lesions are identified today.No pain with calf compression, swelling, warmth, erythema  Assessment: 66 year old male with symptomatic onychomycosis, hyperkeratotic lesions; neuropathy  Plan: -All treatment options discussed with the patient including all alternatives, risks, complications.  -Nails debrided x10 without any complications or bleeding -Hyperkeratotic lesion sharply treated x3 without complications -Continue gabapentin.  Monitor neuropathy symptoms.  -Discussed daily foot inspection, glucose control  Trula Slade DPM

## 2022-10-31 DIAGNOSIS — D124 Benign neoplasm of descending colon: Secondary | ICD-10-CM | POA: Diagnosis not present

## 2022-10-31 DIAGNOSIS — D122 Benign neoplasm of ascending colon: Secondary | ICD-10-CM | POA: Diagnosis not present

## 2022-10-31 DIAGNOSIS — D123 Benign neoplasm of transverse colon: Secondary | ICD-10-CM | POA: Diagnosis not present

## 2022-10-31 DIAGNOSIS — K649 Unspecified hemorrhoids: Secondary | ICD-10-CM | POA: Diagnosis not present

## 2022-10-31 DIAGNOSIS — Z86010 Personal history of colonic polyps: Secondary | ICD-10-CM | POA: Diagnosis not present

## 2022-10-31 DIAGNOSIS — Z09 Encounter for follow-up examination after completed treatment for conditions other than malignant neoplasm: Secondary | ICD-10-CM | POA: Diagnosis not present

## 2022-11-05 DIAGNOSIS — D122 Benign neoplasm of ascending colon: Secondary | ICD-10-CM | POA: Diagnosis not present

## 2022-11-05 DIAGNOSIS — D123 Benign neoplasm of transverse colon: Secondary | ICD-10-CM | POA: Diagnosis not present

## 2022-11-05 DIAGNOSIS — D124 Benign neoplasm of descending colon: Secondary | ICD-10-CM | POA: Diagnosis not present

## 2022-11-19 ENCOUNTER — Other Ambulatory Visit: Payer: Self-pay | Admitting: Internal Medicine

## 2022-11-19 DIAGNOSIS — I10 Essential (primary) hypertension: Secondary | ICD-10-CM

## 2022-11-20 NOTE — Telephone Encounter (Signed)
Please refill as per office routine med refill policy (all routine meds to be refilled for 3 mo or monthly (per pt preference) up to one year from last visit, then month to month grace period for 3 mo, then further med refills will have to be denied) ? ?

## 2022-12-13 DIAGNOSIS — D485 Neoplasm of uncertain behavior of skin: Secondary | ICD-10-CM | POA: Diagnosis not present

## 2022-12-13 DIAGNOSIS — Z85828 Personal history of other malignant neoplasm of skin: Secondary | ICD-10-CM | POA: Diagnosis not present

## 2022-12-13 DIAGNOSIS — L821 Other seborrheic keratosis: Secondary | ICD-10-CM | POA: Diagnosis not present

## 2022-12-13 DIAGNOSIS — D1801 Hemangioma of skin and subcutaneous tissue: Secondary | ICD-10-CM | POA: Diagnosis not present

## 2022-12-13 DIAGNOSIS — Z129 Encounter for screening for malignant neoplasm, site unspecified: Secondary | ICD-10-CM | POA: Diagnosis not present

## 2022-12-15 ENCOUNTER — Other Ambulatory Visit: Payer: Self-pay | Admitting: Internal Medicine

## 2023-01-05 ENCOUNTER — Other Ambulatory Visit: Payer: Self-pay | Admitting: Internal Medicine

## 2023-01-05 NOTE — Telephone Encounter (Signed)
Please refill as per office routine med refill policy (all routine meds to be refilled for 3 mo or monthly (per pt preference) up to one year from last visit, then month to month grace period for 3 mo, then further med refills will have to be denied)

## 2023-01-23 ENCOUNTER — Ambulatory Visit (INDEPENDENT_AMBULATORY_CARE_PROVIDER_SITE_OTHER): Payer: Medicare Other | Admitting: Internal Medicine

## 2023-01-23 ENCOUNTER — Encounter: Payer: Self-pay | Admitting: Internal Medicine

## 2023-01-23 ENCOUNTER — Other Ambulatory Visit: Payer: Self-pay | Admitting: Internal Medicine

## 2023-01-23 VITALS — BP 128/72 | HR 64 | Temp 98.6°F | Ht 72.0 in | Wt 295.0 lb

## 2023-01-23 DIAGNOSIS — E559 Vitamin D deficiency, unspecified: Secondary | ICD-10-CM

## 2023-01-23 DIAGNOSIS — E538 Deficiency of other specified B group vitamins: Secondary | ICD-10-CM

## 2023-01-23 DIAGNOSIS — Z0001 Encounter for general adult medical examination with abnormal findings: Secondary | ICD-10-CM

## 2023-01-23 DIAGNOSIS — K5900 Constipation, unspecified: Secondary | ICD-10-CM | POA: Diagnosis not present

## 2023-01-23 DIAGNOSIS — Z125 Encounter for screening for malignant neoplasm of prostate: Secondary | ICD-10-CM | POA: Diagnosis not present

## 2023-01-23 DIAGNOSIS — E039 Hypothyroidism, unspecified: Secondary | ICD-10-CM

## 2023-01-23 DIAGNOSIS — E782 Mixed hyperlipidemia: Secondary | ICD-10-CM | POA: Diagnosis not present

## 2023-01-23 DIAGNOSIS — R350 Frequency of micturition: Secondary | ICD-10-CM

## 2023-01-23 DIAGNOSIS — I10 Essential (primary) hypertension: Secondary | ICD-10-CM | POA: Diagnosis not present

## 2023-01-23 DIAGNOSIS — E1149 Type 2 diabetes mellitus with other diabetic neurological complication: Secondary | ICD-10-CM

## 2023-01-23 DIAGNOSIS — Z23 Encounter for immunization: Secondary | ICD-10-CM | POA: Diagnosis not present

## 2023-01-23 LAB — MICROALBUMIN / CREATININE URINE RATIO
Creatinine,U: 70.9 mg/dL
Microalb Creat Ratio: 1 mg/g (ref 0.0–30.0)
Microalb, Ur: 0.7 mg/dL (ref 0.0–1.9)

## 2023-01-23 LAB — URINALYSIS, ROUTINE W REFLEX MICROSCOPIC
Bilirubin Urine: NEGATIVE
Ketones, ur: NEGATIVE
Leukocytes,Ua: NEGATIVE
Nitrite: NEGATIVE
RBC / HPF: NONE SEEN (ref 0–?)
Specific Gravity, Urine: 1.02 (ref 1.000–1.030)
Total Protein, Urine: NEGATIVE
Urine Glucose: >=1000 — AB
Urobilinogen, UA: 0.2 (ref 0.0–1.0)
pH: 5.5 (ref 5.0–8.0)

## 2023-01-23 LAB — CBC WITH DIFFERENTIAL/PLATELET
Basophils Absolute: 0 10*3/uL (ref 0.0–0.1)
Basophils Relative: 0.6 % (ref 0.0–3.0)
Eosinophils Absolute: 0.5 10*3/uL (ref 0.0–0.7)
Eosinophils Relative: 6.1 % — ABNORMAL HIGH (ref 0.0–5.0)
HCT: 43.5 % (ref 39.0–52.0)
Hemoglobin: 14.8 g/dL (ref 13.0–17.0)
Lymphocytes Relative: 39.4 % (ref 12.0–46.0)
Lymphs Abs: 3 10*3/uL (ref 0.7–4.0)
MCHC: 34.1 g/dL (ref 30.0–36.0)
MCV: 87.8 fl (ref 78.0–100.0)
Monocytes Absolute: 0.6 10*3/uL (ref 0.1–1.0)
Monocytes Relative: 8.1 % (ref 3.0–12.0)
Neutro Abs: 3.5 10*3/uL (ref 1.4–7.7)
Neutrophils Relative %: 45.8 % (ref 43.0–77.0)
Platelets: 205 10*3/uL (ref 150.0–400.0)
RBC: 4.95 Mil/uL (ref 4.22–5.81)
RDW: 13.1 % (ref 11.5–15.5)
WBC: 7.6 10*3/uL (ref 4.0–10.5)

## 2023-01-23 LAB — PSA: PSA: 0.06 ng/mL — ABNORMAL LOW (ref 0.10–4.00)

## 2023-01-23 LAB — HEMOGLOBIN A1C: Hgb A1c MFr Bld: 9.1 % — ABNORMAL HIGH (ref 4.6–6.5)

## 2023-01-23 LAB — BASIC METABOLIC PANEL
BUN: 20 mg/dL (ref 6–23)
CO2: 30 mEq/L (ref 19–32)
Calcium: 9.7 mg/dL (ref 8.4–10.5)
Chloride: 101 mEq/L (ref 96–112)
Creatinine, Ser: 0.87 mg/dL (ref 0.40–1.50)
GFR: 88.9 mL/min (ref >60.00)
Glucose, Bld: 235 mg/dL — ABNORMAL HIGH (ref 70–99)
Potassium: 4.6 mEq/L (ref 3.5–5.1)
Sodium: 140 mEq/L (ref 135–145)

## 2023-01-23 LAB — HEPATIC FUNCTION PANEL
ALT: 28 U/L (ref 0–53)
AST: 21 U/L (ref 0–37)
Albumin: 4.2 g/dL (ref 3.5–5.2)
Alkaline Phosphatase: 43 U/L (ref 39–117)
Bilirubin, Direct: 0.1 mg/dL (ref 0.0–0.3)
Total Bilirubin: 0.3 mg/dL (ref 0.2–1.2)
Total Protein: 6.7 g/dL (ref 6.0–8.3)

## 2023-01-23 LAB — VITAMIN B12: Vitamin B-12: 723 pg/mL (ref 211–911)

## 2023-01-23 LAB — LIPID PANEL
Cholesterol: 125 mg/dL (ref 0–200)
HDL: 37 mg/dL — ABNORMAL LOW (ref >39.00)
LDL Cholesterol: 49 mg/dL (ref 0–99)
NonHDL: 88.15
Total CHOL/HDL Ratio: 3
Triglycerides: 195 mg/dL — ABNORMAL HIGH (ref 0.0–149.0)
VLDL: 39 mg/dL (ref 0.0–40.0)

## 2023-01-23 LAB — VITAMIN D 25 HYDROXY (VIT D DEFICIENCY, FRACTURES): VITD: 41.91 ng/mL (ref 30.00–100.00)

## 2023-01-23 LAB — TSH: TSH: 5 u[IU]/mL (ref 0.35–5.50)

## 2023-01-23 MED ORDER — ALLOPURINOL 300 MG PO TABS: 300.0000 mg | ORAL_TABLET | Freq: Every day | ORAL | 3 refills | Status: DC

## 2023-01-23 MED ORDER — LISINOPRIL 20 MG PO TABS: 20.0000 mg | ORAL_TABLET | Freq: Every day | ORAL | 3 refills | Status: DC

## 2023-01-23 MED ORDER — LEVOTHYROXINE SODIUM 50 MCG PO TABS: 50.0000 ug | ORAL_TABLET | Freq: Every day | ORAL | 3 refills | Status: DC

## 2023-01-23 MED ORDER — GLIPIZIDE ER 10 MG PO TB24: 10.0000 mg | ORAL_TABLET | Freq: Every day | ORAL | 3 refills | Status: DC

## 2023-01-23 MED ORDER — SOLIFENACIN SUCCINATE 5 MG PO TABS: 5.0000 mg | ORAL_TABLET | Freq: Every day | ORAL | 3 refills | Status: DC

## 2023-01-23 MED ORDER — ATENOLOL-CHLORTHALIDONE 50-25 MG PO TABS: 0.5000 | ORAL_TABLET | Freq: Every day | ORAL | 3 refills | Status: DC

## 2023-01-23 MED ORDER — ROSUVASTATIN CALCIUM 40 MG PO TABS: 40.0000 mg | ORAL_TABLET | Freq: Every day | ORAL | 3 refills | Status: DC

## 2023-01-23 MED ORDER — DAPAGLIFLOZIN PROPANEDIOL 10 MG PO TABS: 10.0000 mg | ORAL_TABLET | Freq: Every day | ORAL | 3 refills | Status: DC

## 2023-01-23 MED ORDER — METFORMIN HCL ER 500 MG PO TB24: 1000.0000 mg | ORAL_TABLET | Freq: Every day | ORAL | 3 refills | Status: DC

## 2023-01-23 MED ORDER — GABAPENTIN 300 MG PO CAPS: 300.0000 mg | ORAL_CAPSULE | Freq: Two times a day (BID) | ORAL | 1 refills | Status: DC

## 2023-01-23 NOTE — Assessment & Plan Note (Signed)
Last vitamin D Lab Results  Component Value Date   VD25OH 41.91 01/23/2023   Stable, cont oral replacement

## 2023-01-23 NOTE — Assessment & Plan Note (Signed)
Age and sex appropriate education and counseling updated with regular exercise and diet Referrals for preventative services - for optho exam soon Immunizations addressed - for flu shot today Smoking counseling  - none needed Evidence for depression or other mood disorder - none significant Most recent labs reviewed. I have personally reviewed and have noted: 1) the patient's medical and social history 2) The patient's current medications and supplements 3) The patient's height, weight, and BMI have been recorded in the chart

## 2023-01-23 NOTE — Assessment & Plan Note (Signed)
Mild to mod, for otc miralax prn,  to f/u any worsening symptoms or concerns

## 2023-01-23 NOTE — Assessment & Plan Note (Signed)
Lab Results  Component Value Date   HGBA1C 9.1 (H) 01/23/2023   Worsening control, ? Releated to urinary frequency, pt to continue current medical treatment for increased farixga to 10 mg, increased metfomrin eR 500 mg to 2 qd, and to start ozempic if affordable, and continue glucotrol xl 10 qd

## 2023-01-23 NOTE — Assessment & Plan Note (Signed)
Lab Results  Component Value Date   LDLCALC 49 01/23/2023   Stable, pt to continue current statin crestor 40 mg qd

## 2023-01-23 NOTE — Patient Instructions (Signed)
You had the flu shot today  Please take all new medication as prescribed - the vesicare for bladder  Please continue all other medications as before, and refills have been done if requested.  Please have the pharmacy call with any other refills you may need.  Please continue your efforts at being more active, low cholesterol diet, and weight control.  You are otherwise up to date with prevention measures today.  Please keep your appointments with your specialists as you may have planned  Please go to the LAB at the blood drawing area for the tests to be done  You will be contacted by phone if any changes need to be made immediately.  Otherwise, you will receive a letter about your results with an explanation, but please check with MyChart first.  Please remember to sign up for MyChart if you have not done so, as this will be important to you in the future with finding out test results, communicating by private email, and scheduling acute appointments online when needed.  Please make an Appointment to return in 6 months, or sooner if needed

## 2023-01-23 NOTE — Assessment & Plan Note (Signed)
Lab Results  Component Value Date   [id number] 060 01/23/2023   Stable, cont oral replacement - b12 1000 mcg qd

## 2023-01-23 NOTE — Assessment & Plan Note (Signed)
Lab Results  Component Value Date   TSH 5.00 01/23/2023   Stable, pt to continue levothyroxine 50 mcg qd

## 2023-01-23 NOTE — Assessment & Plan Note (Signed)
For UA today with labs, also trial vesicare 5 qd

## 2023-01-23 NOTE — Progress Notes (Signed)
Patient ID: Leon Nelson, male   DOB: September 18, 1956, 67 y.o.   MRN: [id number]         Chief Complaint:: wellness exam and 62mofollow up (Constipation since colonoscopy back in November, frequent urination and cannot control)  , dm, low vit d, htn, hld       HPI:  Leon LASHLEYis a 67y.o. male here for wellness exam; has eye appt for Feb 5; due for flu shot today, o/w up to date                        Also Pt denies chest pain, increased sob or doe, wheezing, orthopnea, PND, increased LE swelling, palpitations, dizziness or syncope.   Pt denies polydipsia, polyuria, or new focal neuro s/s.    Pt denies fever, wt loss, night sweats, loss of appetite, or other constitutional symptoms  Peak wt has been 357,now under 300 for over 2 yrs with better diet.  Not taking the ozempic yet from last visit but plans to work on the cost and start soon.  Denies worsening reflux, abd pain, dysphagia, n/v, or blood except has worsening 2-3 wks mild intermittent constipation.  Also c/o > 1 mo urinary frquency and urge incontinence, and several times nocturia per night., has some urgency and incontinence.   Denies urinary symptoms such as dysuria, flank pain, hematuria or n/v, fever, chills.   BP Readings from Last 3 Encounters:  01/23/23 128/72  08/29/22 132/72  07/20/22 112/60   Immunization History  Administered Date(s) Administered   Fluad Quad(high Dose 65+) 12/04/2020, 01/17/2022, 01/23/2023   H1N1 01/24/2009   Influenza Split 09/21/2011   Influenza Whole 09/23/2008, 09/23/2009   Influenza, Seasonal, Injecte, Preservative Fre 02/11/2013   Influenza,inj,Quad PF,6+ Mos 01/20/2014, 09/16/2015, 11/06/2019   Influenza-Unspecified 09/22/2016, 10/20/2018   PFIZER(Purple Top)SARS-COV-2 Vaccination 01/25/2020, 02/15/2020, 12/04/2020   Pneumococcal Conjugate-13 11/06/2019   Pneumococcal Polysaccharide-23 12/24/2005, 07/21/2014, 04/24/2021   Td 01/24/2009   Tdap 11/06/2019   There are no preventive care  reminders to display for this patient.     Past Medical History:  Diagnosis Date   ABDOMINAL PAIN OTHER SPECIFIED SITE 07/20/2008   CHEST PAIN 02/06/2011   COLONIC POLYPS, HX OF 05/26/2008   DEPRESSION 05/26/2008   DIABETES MELLITUS, TYPE II 05/26/2008   HYPERLIPIDEMIA 05/26/2008   HYPERTENSION 05/26/2008   HYPOTHYROIDISM 05/26/2008   KNEE PAIN, RIGHT, ACUTE 07/01/2008   Personal history of urinary calculi 05/26/2008   UTI'S, HX OF 05/26/2008   Past Surgical History:  Procedure Laterality Date   s/p right knee arthroplasty      complicated by patellar tendon rupture Jan 2011 Dr. gGladstone Lighter  TONSILLECTOMY      reports that he has never smoked. He has never used smokeless tobacco. He reports that he does not drink alcohol and does not use drugs. family history includes Alcohol abuse in an other family member; Cancer in his mother; Diabetes in an other family member; Heart disease in his father and mother; Hyperlipidemia in his father and mother. Allergies  Allergen Reactions   Penicillins Other (See Comments)    Blisters on hands   Sulfonamide Derivatives Other (See Comments)    Blisters in groin area   Current Outpatient Medications on File Prior to Visit  Medication Sig Dispense Refill   Blood Glucose Monitoring Suppl (ONE TOUCH ULTRA 2) w/Device KIT Use as directed once daily E11.9 1 each 0   dapagliflozin propanediol (FARXIGA) 5 MG TABS  tablet Take 1 tablet (5 mg total) by mouth daily before breakfast. 90 tablet 3   diclofenac sodium (VOLTAREN) 1 % GEL Apply 2 g topically 4 (four) times daily as needed. 200 g 5   glucose blood (ONE TOUCH ULTRA TEST) test strip Use as instructed once daily E11.9 100 each 12   Lancets MISC Use as directed once daily E11.9 100 each 3   potassium chloride SA (KLOR-CON M) 20 MEQ tablet TAKE 2 TABLETS BY MOUTH DAILY 200 tablet 2   venlafaxine XR (EFFEXOR-XR) 75 MG 24 hr capsule TAKE 1 CAPSULE BY MOUTH DAILY 90 capsule 1   vitamin B-12 (CYANOCOBALAMIN) 1000 MCG tablet  Take 1 tablet (1,000 mcg total) by mouth daily. 90 tablet 3   Semaglutide,0.25 or 0.'5MG'$ /DOS, (OZEMPIC, 0.25 OR 0.5 MG/DOSE,) 2 MG/3ML SOPN Take 0.5 mg subq weekly (Patient not taking: Reported on 01/23/2023) 9 mL 3   No current facility-administered medications on file prior to visit.        ROS:  All others reviewed and negative.  Objective        PE:  BP 128/72 (BP Location: Right Arm, Patient Position: Sitting, Cuff Size: Large)   Pulse 64   Temp 98.6 F (37 C) (Oral)   Ht 6' (1.829 m)   Wt 295 lb (133.8 kg)   SpO2 93%   BMI 40.01 kg/m                 Constitutional: Pt appears in NAD               HENT: Head: NCAT.                Right Ear: External ear normal.                 Left Ear: External ear normal.                Eyes: . Pupils are equal, round, and reactive to light. Conjunctivae and EOM are normal               Nose: without d/c or deformity               Neck: Neck supple. Gross normal ROM               Cardiovascular: Normal rate and regular rhythm.                 Pulmonary/Chest: Effort normal and breath sounds without rales or wheezing.                Abd:  Soft, NT, ND, + BS, no organomegaly               Neurological: Pt is alert. At baseline orientation, motor grossly intact               Skin: Skin is warm. No rashes, no other new lesions, LE edema - none               Psychiatric: Pt behavior is normal without agitation   Micro: none  Cardiac tracings I have personally interpreted today:  none  Pertinent Radiological findings (summarize): none   Lab Results  Component Value Date   WBC 7.6 01/23/2023   HGB 14.8 01/23/2023   HCT 43.5 01/23/2023   PLT 205.0 01/23/2023   GLUCOSE 235 (H) 01/23/2023   CHOL 125 01/23/2023   TRIG 195.0 (H) 01/23/2023   HDL 37.00 (L) 01/23/2023  LDLDIRECT 72.0 07/17/2022   LDLCALC 49 01/23/2023   ALT 28 01/23/2023   AST 21 01/23/2023   NA 140 01/23/2023   K 4.6 01/23/2023   CL 101 01/23/2023   CREATININE 0.87  01/23/2023   BUN 20 01/23/2023   CO2 30 01/23/2023   TSH 5.00 01/23/2023   PSA 0.06 (L) 01/23/2023   INR 2.0 ratio (H) 01/16/2010   HGBA1C 9.1 (H) 01/23/2023   MICROALBUR 0.7 01/23/2023   Assessment/Plan:  BERLE FITZ is a 67 y.o. White or Caucasian [1] male with  has a past medical history of ABDOMINAL PAIN OTHER SPECIFIED SITE (07/20/2008), CHEST PAIN (02/06/2011), COLONIC POLYPS, HX OF (05/26/2008), DEPRESSION (05/26/2008), DIABETES MELLITUS, TYPE II (05/26/2008), HYPERLIPIDEMIA (05/26/2008), HYPERTENSION (05/26/2008), HYPOTHYROIDISM (05/26/2008), KNEE PAIN, RIGHT, ACUTE (07/01/2008), Personal history of urinary calculi (05/26/2008), and UTI'S, HX OF (05/26/2008).  Encounter for well adult exam with abnormal findings Age and sex appropriate education and counseling updated with regular exercise and diet Referrals for preventative services - for optho exam soon Immunizations addressed - for flu shot today Smoking counseling  - none needed Evidence for depression or other mood disorder - none significant Most recent labs reviewed. I have personally reviewed and have noted: 1) the patient's medical and social history 2) The patient's current medications and supplements 3) The patient's height, weight, and BMI have been recorded in the chart   B12 deficiency Lab Results  Component Value Date   VITAMINB12 723 01/23/2023   Stable, cont oral replacement - b12 1000 mcg qd   Diabetes mellitus type 2 with neurological manifestations ([place]) Lab Results  Component Value Date   HGBA1C 9.1 (H) 01/23/2023   Worsening control, ? Releated to urinary frequency, pt to continue current medical treatment for increased farixga to 10 mg, increased metfomrin eR 500 mg to 2 qd, and to start ozempic if affordable, and continue glucotrol xl 10 qd   Essential hypertension BP Readings from Last 3 Encounters:  01/23/23 128/72  08/29/22 132/72  07/20/22 112/60   Stable, pt to continue medical treatment tenoretic  50-12.5, lisinopril 20 qd   Hyperlipidemia Lab Results  Component Value Date   LDLCALC 49 01/23/2023   Stable, pt to continue current statin crestor 40 mg qd   Hypothyroidism Lab Results  Component Value Date   TSH 5.00 01/23/2023   Stable, pt to continue levothyroxine 50 mcg qd   Vitamin D deficiency Last vitamin D Lab Results  Component Value Date   VD25OH 41.91 01/23/2023   Stable, cont oral replacement   Urinary frequency For UA today with labs, also trial vesicare 5 qd  Constipation Mild to mod, for otc miralax prn,  to f/u any worsening symptoms or concerns  Followup: Return in about 6 months (around 07/24/2023).  [name], MD 01/23/2023 9:31 PM [facility]

## 2023-01-23 NOTE — Assessment & Plan Note (Signed)
BP Readings from Last 3 Encounters:  01/23/23 128/72  08/29/22 132/72  07/20/22 112/60   Stable, pt to continue medical treatment tenoretic 50-12.5, lisinopril 20 qd

## 2023-01-28 ENCOUNTER — Ambulatory Visit: Payer: Medicare Other | Admitting: Podiatry

## 2023-01-28 DIAGNOSIS — M79675 Pain in left toe(s): Secondary | ICD-10-CM | POA: Diagnosis not present

## 2023-01-28 DIAGNOSIS — B351 Tinea unguium: Secondary | ICD-10-CM | POA: Diagnosis not present

## 2023-01-28 DIAGNOSIS — M79674 Pain in right toe(s): Secondary | ICD-10-CM | POA: Diagnosis not present

## 2023-01-28 DIAGNOSIS — Q828 Other specified congenital malformations of skin: Secondary | ICD-10-CM | POA: Diagnosis not present

## 2023-01-28 DIAGNOSIS — E1149 Type 2 diabetes mellitus with other diabetic neurological complication: Secondary | ICD-10-CM

## 2023-01-28 LAB — HM DIABETES EYE EXAM

## 2023-01-28 NOTE — Progress Notes (Signed)
Subjective:  67 year old male presents the office today for diabetic foot evaluation and for calluses to both of his great toes as well as the toenails he cannot trim himself.  No open lesions and no new concerns.   Last A1c was 9.1 on 01/23/2023 Biagio Borg, MD Last seen 01/23/2023  Objective: AAO x3, NAD DP/PT pulses palpable bilaterally, CRT less than 3 seconds Sensation decreased with SWMF.  Nails are hypertrophic, dystrophic, brittle, discolored, elongated 10. No surrounding redness or drainage. Tenderness nails 1-5 bilaterally.   Hyperkeratotic lesions to bilateral hallux and left submetatarsal 2.  Upon debridement no underlying ulceration, drainage or any signs of infection.  Flatfoot present bilateral.  No other open lesions or pre-ulcerative lesions are identified today.No pain with calf compression, swelling, warmth, erythema  Assessment: 67 year old male with symptomatic onychomycosis, hyperkeratotic lesions; neuropathy  Plan: -All treatment options discussed with the patient including all alternatives, risks, complications.  -Nails debrided x10 without any complications or bleeding -Hyperkeratotic lesion sharply treated x 3 without complications -Continue gabapentin.  Monitor neuropathy symptoms.  -Discussed daily foot inspection, glucose control  Trula Slade DPM

## 2023-01-28 NOTE — Patient Instructions (Signed)

## 2023-03-08 ENCOUNTER — Telehealth: Payer: Self-pay | Admitting: Internal Medicine

## 2023-03-08 MED ORDER — DAPAGLIFLOZIN PROPANEDIOL 10 MG PO TABS: 10.0000 mg | ORAL_TABLET | Freq: Every day | ORAL | 3 refills | Status: DC

## 2023-03-08 NOTE — Telephone Encounter (Signed)
Please advise if this is accurate

## 2023-03-08 NOTE — Telephone Encounter (Signed)
Patient spoke with Dr. Jenny Reichmann about increasing his dosage from 5 mg to 10 mg of Farxiga. The new dosage needs to be faxed to his pharmacy at [phone]).  Best callback is [phone].

## 2023-03-08 NOTE — Telephone Encounter (Signed)
See below

## 2023-03-08 NOTE — Telephone Encounter (Signed)
Ok that is true - I sent corrected [facility]

## 2023-03-08 NOTE — Telephone Encounter (Signed)
It must be sent to [facility] - at fax # [phone] - it was faxed to [facility] last time - not this listed number.  Please refax

## 2023-03-21 NOTE — Telephone Encounter (Signed)
Pt called never received fax. It need to be sent to ([facility]) name need to be included  Fax number [phone]  Pt want a call once it has been sent.

## 2023-03-26 NOTE — Telephone Encounter (Signed)
Patient called back with the following information:  [facility] is the name of the pharmacy  Fax #  [phone]  Please send rx to the above named pharmacy  This is for the [place]

## 2023-03-27 MED ORDER — DAPAGLIFLOZIN PROPANEDIOL 10 MG PO TABS: 10.0000 mg | ORAL_TABLET | Freq: Every day | ORAL | 3 refills | Status: DC

## 2023-03-27 NOTE — Telephone Encounter (Signed)
I assume a written Rx needs to be faxed for the farxiga, please advise

## 2023-03-27 NOTE — Telephone Encounter (Signed)
Patient said he has about 2 weeks worth of the medication left. He is checking on the status of the refill. Best callback is [phone].

## 2023-03-27 NOTE — Addendum Note (Signed)
Addended by: Biagio Borg on: 03/27/2023 04:45 PM   Modules accepted: Orders

## 2023-03-27 NOTE — Telephone Encounter (Signed)
Ok  - done hardcopy to CMA 

## 2023-03-30 ENCOUNTER — Ambulatory Visit
Admission: EM | Admit: 2023-03-30 | Discharge: 2023-03-30 | Disposition: A | Discharge status: Home / Self Care | Payer: Medicare Other | Attending: Family Medicine | Admitting: Family Medicine

## 2023-03-30 DIAGNOSIS — R059 Cough, unspecified: Secondary | ICD-10-CM

## 2023-03-30 MED ORDER — AZITHROMYCIN 250 MG PO TABS: 250.0000 mg | ORAL_TABLET | Freq: Every day | ORAL | 0 refills | Status: DC

## 2023-03-30 NOTE — ED Triage Notes (Signed)
Pt c/o cough and sore throat that started Wed night. No known fever. Coughing up lots of  mucous. No OTC meds tried.

## 2023-03-30 NOTE — Discharge Instructions (Addendum)
Patient to take medication as directed with food to completion.  Encouraged increase daily water intake to 64 ounces per day while taking this medication.  Positive symptoms worsen and/or unresolved please follow-up with PCP or here for further evaluation.

## 2023-03-30 NOTE — ED Provider Notes (Signed)
[facility]    CSN: [id number] Arrival date & time: 03/30/23  1314      History   Chief Complaint Chief Complaint  Patient presents with   Cough   Sore Throat    HPI Leon Nelson is a 67 y.o. male.   HPI Pleasant 67 year old male presents with cough and sore throat for 4 days.  Denies fever and has tried no OTC medications.  PMH significant for morbid obesity, HTN and T2 DM with neurological manifestations.  Past Medical History:  Diagnosis Date   ABDOMINAL PAIN OTHER SPECIFIED SITE 07/20/2008   CHEST PAIN 02/06/2011   COLONIC POLYPS, HX OF 05/26/2008   DEPRESSION 05/26/2008   DIABETES MELLITUS, TYPE II 05/26/2008   HYPERLIPIDEMIA 05/26/2008   HYPERTENSION 05/26/2008   HYPOTHYROIDISM 05/26/2008   KNEE PAIN, RIGHT, ACUTE 07/01/2008   Personal history of urinary calculi 05/26/2008   UTI'S, HX OF 05/26/2008    Patient Active Problem List   Diagnosis Date Noted   Urinary frequency 01/23/2023   Constipation 01/23/2023   RUQ pain 04/24/2021   Vitamin D deficiency 04/24/2021   B12 deficiency 04/24/2021   Balance disorder 06/22/2020   Dizziness 06/22/2020   Headache 06/22/2020   Allergic rhinitis 11/07/2019   Abdominal pain, right lower quadrant 06/04/2019   Syncope 06/04/2019   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 02/06/2019   Rhabdomyolysis 01/08/2019   SIRS (systemic inflammatory response syndrome) 01/08/2019   Elevated transaminase level 01/08/2019   Chronic pain of left ankle 11/01/2018   Acute gouty arthritis 04/17/2018   Left cervical radiculopathy 01/27/2018   Bilateral shoulder pain 01/27/2018   Left hand paresthesia 01/27/2018   Hypokalemia 03/27/2017   Posterior neck pain 09/13/2015   Heart murmur previously undiagnosed 08/14/2011   Encounter for well adult exam with abnormal findings 08/12/2011   CHEST PAIN 02/06/2011   ABDOMINAL PAIN OTHER SPECIFIED SITE 07/20/2008   KNEE PAIN, RIGHT, ACUTE 07/01/2008   Hypothyroidism 05/26/2008   Diabetes  mellitus type 2 with neurological manifestations 05/26/2008   Hyperlipidemia 05/26/2008   Depression 05/26/2008   Essential hypertension 05/26/2008   History of colonic polyps 05/26/2008   UTI'S, HX OF 05/26/2008   Personal history of urinary calculi 05/26/2008    Past Surgical History:  Procedure Laterality Date   s/p right knee arthroplasty      complicated by patellar tendon rupture Jan 2011 Dr. Darrelyn Hillockgioffre   TONSILLECTOMY         Home Medications    Prior to Admission medications   Medication Sig Start Date End Date Taking? Authorizing Provider  azithromycin (ZITHROMAX) 250 MG tablet Take 1 tablet (250 mg total) by mouth daily. Take first 2 tablets together, then 1 every day until finished. 03/30/23  Yes Leon Nelson, Leon Gerdts, Leon Nelson  metFORMIN (GLUCOPHAGE-XR) 500 MG 24 hr tablet Take 2 tablets (1,000 mg total) by mouth daily with breakfast. 01/23/23   Leon Nelson, Leon Nelson, Leon Nelson  allopurinol (ZYLOPRIM) 300 MG tablet Take 1 tablet (300 mg total) by mouth daily. 01/23/23   Leon Nelson, Leon Nelson, Leon Nelson  atenolol-chlorthalidone (TENORETIC) 50-25 MG tablet Take 0.5 tablets by mouth daily. 01/23/23   Leon Nelson, Leon Nelson, Leon Nelson  Blood Glucose Monitoring Suppl (ONE TOUCH ULTRA 2) Nelson/Device KIT Use as directed once daily E11.9 02/27/19   Leon Nelson, Leon Nelson, Leon Nelson  dapagliflozin propanediol (FARXIGA) 10 MG TABS tablet Take 1 tablet (10 mg total) by mouth daily before breakfast. 03/27/23   Leon Nelson, Leon Nelson, Leon Nelson  diclofenac sodium (VOLTAREN) 1 % GEL Apply  2 g topically 4 (four) times daily as needed. 10/29/18   Leon Levins, Leon Nelson  dicyclomine (BENTYL) 20 MG tablet TAKE 1 TABLET BY MOUTH 3 TIMES  DAILY AS NEEDED 01/23/23   Leon Levins, Leon Nelson  gabapentin (NEURONTIN) 300 MG capsule Take 1 capsule (300 mg total) by mouth 2 (two) times daily. 01/23/23   Leon Levins, Leon Nelson  glipiZIDE (GLUCOTROL XL) 10 MG 24 hr tablet Take 1 tablet (10 mg total) by mouth daily with breakfast. 01/23/23   Leon Levins, Leon Nelson  glucose blood (ONE TOUCH ULTRA TEST) test strip Use as  instructed once daily E11.9 02/27/19   Leon Levins, Leon Nelson  Lancets MISC Use as directed once daily E11.9 02/27/19   Leon Levins, Leon Nelson  levothyroxine (SYNTHROID) 50 MCG tablet Take 1 tablet (50 mcg total) by mouth daily. 01/23/23   Leon Levins, Leon Nelson  lisinopril (ZESTRIL) 20 MG tablet Take 1 tablet (20 mg total) by mouth daily. 01/23/23   Leon Levins, Leon Nelson  potassium chloride SA (KLOR-CON M) 20 MEQ tablet TAKE 2 TABLETS BY MOUTH DAILY 10/03/22   Leon Levins, Leon Nelson  rosuvastatin (CRESTOR) 40 MG tablet Take 1 tablet (40 mg total) by mouth daily. 01/23/23   Leon Levins, Leon Nelson  Semaglutide,0.25 or 0.5MG /DOS, (OZEMPIC, 0.25 OR 0.5 MG/DOSE,) 2 MG/3ML SOPN Take 0.5 mg subq weekly Patient not taking: Reported on 01/23/2023 07/20/22   Leon Levins, Leon Nelson  solifenacin (VESICARE) 5 MG tablet Take 1 tablet (5 mg total) by mouth daily. 01/23/23   Leon Levins, Leon Nelson  venlafaxine XR (EFFEXOR-XR) 75 MG 24 hr capsule TAKE 1 CAPSULE BY MOUTH DAILY 12/15/22   Leon Levins, Leon Nelson  vitamin B-12 (CYANOCOBALAMIN) 1000 MCG tablet Take 1 tablet (1,000 mcg total) by mouth daily. 05/12/20   Leon Levins, Leon Nelson    Family History Family History  Problem Relation Age of Onset   Cancer Mother        breast cancer   Hyperlipidemia Mother    Heart disease Mother    Hyperlipidemia Father    Heart disease Father    Alcohol abuse Other    Diabetes Other     Social History Social History   Tobacco Use   Smoking status: Never   Smokeless tobacco: Never  Substance Use Topics   Alcohol use: No   Drug use: No     Allergies   Penicillins and Sulfonamide derivatives   Review of Systems Review of Systems  HENT:  Positive for congestion and sore throat.   Respiratory:  Positive for cough.      Physical Exam Triage Vital Signs ED Triage Vitals  Enc Vitals Group     BP 03/30/23 1342 (!) 145/80     Pulse Rate 03/30/23 1342 79     Resp 03/30/23 1342 17     Temp 03/30/23 1342 98.6 F (37 C)     Temp Source 03/30/23 1342 Oral      SpO2 03/30/23 1342 95 %     Weight --      Height --      Head Circumference --      Peak Flow --      Pain Score 03/30/23 1343 8     Pain Loc --      Pain Edu? --      Excl. in GC? --    No data found.  Updated Vital Signs BP (!) 145/80 (BP Location: Right Arm)  Pulse 79   Temp 98.6 F (37 C) (Oral)   Resp 17   SpO2 95%       Physical Exam Vitals and nursing note reviewed.  Constitutional:      Appearance: Normal appearance. He is obese. He is ill-appearing.  HENT:     Head: Normocephalic and atraumatic.     Right Ear: Tympanic membrane, ear canal and external ear normal.     Left Ear: Tympanic membrane, ear canal and external ear normal.     Nose: Nose normal.     Mouth/Throat:     Mouth: Mucous membranes are moist.     Pharynx: Oropharynx is clear.  Eyes:     Extraocular Movements: Extraocular movements intact.     Conjunctiva/sclera: Conjunctivae normal.     Pupils: Pupils are equal, round, and reactive to light.  Cardiovascular:     Rate and Rhythm: Normal rate and regular rhythm.     Pulses: Normal pulses.     Heart sounds: Normal heart sounds.  Pulmonary:     Effort: Pulmonary effort is normal.     Breath sounds: Normal breath sounds. No wheezing, rhonchi or rales.     Comments: Infrequent nonproductive cough noted on exam Musculoskeletal:        General: Normal range of motion.     Cervical back: Normal range of motion and neck supple.  Skin:    General: Skin is warm and dry.  Neurological:     General: No focal deficit present.     Mental Status: He is alert and oriented to person, place, and time. Mental status is at baseline.  Psychiatric:        Mood and Affect: Mood normal.        Behavior: Behavior normal.        Thought Content: Thought content normal.      UC Treatments / Results  Labs (all labs ordered are listed, but only abnormal results are displayed) Labs Reviewed - No data to display  EKG   Radiology No results  found.  Procedures Procedures (including critical care time)  Medications Ordered in UC Medications - No data to display  Initial Impression / Assessment and Plan / UC Course  I have reviewed the triage vital signs and the nursing notes.  Pertinent labs & imaging results that were available during my care of the patient were reviewed by me and considered in my medical decision making (see chart for details).     MDM: 1.  Cough-Rx'd Zithromax (500 mg day 1, 250 mg daily x 4 days). Patient to take medication as directed with food to completion.  Encouraged increase daily water intake to 64 ounces per day while taking this medication.  Positive symptoms worsen and/or unresolved please follow-up with PCP or here for further evaluation.  Discharged home, hemodynamically stable. Final Clinical Impressions(s) / UC Diagnoses   Final diagnoses:  Cough, unspecified type     Discharge Instructions      Patient to take medication as directed with food to completion.  Encouraged increase daily water intake to 64 ounces per day while taking this medication.  Positive symptoms worsen and/or unresolved please follow-up with PCP or here for further evaluation.     ED Prescriptions     Medication Sig Dispense Auth. Provider   azithromycin (ZITHROMAX) 250 MG tablet Take 1 tablet (250 mg total) by mouth daily. Take first 2 tablets together, then 1 every day until finished. 6 tablet Leon Iha, Leon Nelson  PDMP not reviewed this encounter.   Leon Iha, Leon Nelson 03/30/23 [phone]

## 2023-03-31 ENCOUNTER — Telehealth: Payer: Self-pay

## 2023-03-31 NOTE — Telephone Encounter (Signed)
Spoke with patient regarding recent urgent care visit. Patient reports he started his medications this morning and that he has no other concerns at this time.

## 2023-04-05 NOTE — Telephone Encounter (Signed)
Patient said that the fax number it needs to be sent to:  Med - [phone]

## 2023-04-08 NOTE — Telephone Encounter (Signed)
Hard copy faxed per patient request

## 2023-05-03 ENCOUNTER — Telehealth: Payer: Self-pay | Admitting: Internal Medicine

## 2023-05-03 NOTE — Telephone Encounter (Signed)
Prescription Request  05/03/2023  LOV: 01/23/2023  What is the name of the medication or equipment? Generic of vesicare -  solifenacin succinate 5 mg. - 90 tablets  Have you contacted your pharmacy to request a refill? Yes   Which pharmacy would you like this sent to?   [facility] [address] [place] - [address] [place] [address] Phone: [phone] Fax: [phone]  Patient notified that their request is being sent to the clinical staff for review and that they should receive a response within 2 business days.   Please advise at Mobile [phone] (mobile)

## 2023-05-07 ENCOUNTER — Ambulatory Visit: Payer: Medicare Other | Admitting: Podiatry

## 2023-05-07 ENCOUNTER — Telehealth: Payer: Self-pay | Admitting: Internal Medicine

## 2023-05-07 ENCOUNTER — Encounter: Payer: Self-pay | Admitting: Podiatry

## 2023-05-07 DIAGNOSIS — Q828 Other specified congenital malformations of skin: Secondary | ICD-10-CM | POA: Diagnosis not present

## 2023-05-07 DIAGNOSIS — M79674 Pain in right toe(s): Secondary | ICD-10-CM | POA: Diagnosis not present

## 2023-05-07 DIAGNOSIS — M79675 Pain in left toe(s): Secondary | ICD-10-CM

## 2023-05-07 DIAGNOSIS — B351 Tinea unguium: Secondary | ICD-10-CM

## 2023-05-07 DIAGNOSIS — E1149 Type 2 diabetes mellitus with other diabetic neurological complication: Secondary | ICD-10-CM | POA: Diagnosis not present

## 2023-05-07 NOTE — Telephone Encounter (Signed)
Form placed on provider desk.

## 2023-05-07 NOTE — Telephone Encounter (Signed)
Patient dropped off document FL2, to be filled out by provider. Patient requested to send it via Fax within 7-days. Document is located in providers tray at front office.Please advise at Mobile [phone] (mobile)   Please fax [phone]

## 2023-05-07 NOTE — Progress Notes (Signed)
Subjective: Chief Complaint  Patient presents with   Nail Problem    Patient report long, thick painful toenails that difficult to trim on his own.   Diabetes    Last A1C in Jan 2024 was 8.3 patient report.     67 year old male presents the office today for diabetic foot evaluation and for calluses to both of his great toes as well as the toenails he cannot trim himself.  No open lesions and no new concerns.   Last A1c was 9.1 on 01/23/2023 Corwin Levins, MD Last seen 01/23/2023  Objective: AAO x3, NAD DP/PT pulses palpable bilaterally, CRT less than 3 seconds Sensation decreased with SWMF.  Nails are hypertrophic, dystrophic, brittle, discolored, elongated 10. No surrounding redness or drainage. Tenderness nails 1-5 bilaterally.   Hyperkeratotic lesions to bilateral hallux and left submetatarsal 2.  Upon debridement no underlying ulceration, drainage or any signs of infection.  Flatfoot present bilateral.  No other open lesions or pre-ulcerative lesions are identified today.No pain with calf compression, swelling, warmth, erythema  Assessment: 67 year old male with symptomatic onychomycosis, hyperkeratotic lesions; neuropathy  Plan: -All treatment options discussed with the patient including all alternatives, risks, complications.  -Nails debrided x10 without any complications or bleeding -Hyperkeratotic lesion sharply treated x 3 without complications -Continue gabapentin.  Monitor neuropathy symptoms.  -Discussed daily foot inspection, glucose control  Vivi Barrack DPM

## 2023-05-13 NOTE — Telephone Encounter (Signed)
Pt called back and pt stated he needed this for Ozempic, and that he has been trying to get this since December. Let pt know w e are sorry about that and also that the previous MA is no longer with [place] to ask the reason why it never got done. But we are going to go ahead and get it done and let his provider signed off on it, once it done will go ahead and faxed. Also let pt be aware that we do need his signature on the forms.

## 2023-05-13 NOTE — Telephone Encounter (Signed)
LVM for pt to call back about specific reason for pt assistance forms

## 2023-05-13 NOTE — Telephone Encounter (Signed)
Called and left voicemail due to the patient assistance form , Dr.John states none of the meds are on the patients current medication list.

## 2023-05-15 ENCOUNTER — Other Ambulatory Visit: Payer: Self-pay

## 2023-05-15 ENCOUNTER — Other Ambulatory Visit: Payer: Self-pay | Admitting: Internal Medicine

## 2023-05-15 MED ORDER — OZEMPIC (0.25 OR 0.5 MG/DOSE) 2 MG/3ML ~~LOC~~ SOPN: PEN_INJECTOR | SUBCUTANEOUS | 3 refills | Status: DC

## 2023-05-15 MED ORDER — SOLIFENACIN SUCCINATE 5 MG PO TABS: 5.0000 mg | ORAL_TABLET | Freq: Every day | ORAL | 3 refills | Status: DC

## 2023-05-15 NOTE — Telephone Encounter (Signed)
Patient needs this sent to [facility] on [address] in [place].  Please call patient and let him know when this is done:  [phone]

## 2023-05-15 NOTE — Telephone Encounter (Signed)
Refill Sent. 

## 2023-05-22 ENCOUNTER — Other Ambulatory Visit: Payer: Self-pay | Admitting: Internal Medicine

## 2023-06-04 ENCOUNTER — Other Ambulatory Visit: Payer: Self-pay | Admitting: Internal Medicine

## 2023-06-06 DIAGNOSIS — D1801 Hemangioma of skin and subcutaneous tissue: Secondary | ICD-10-CM | POA: Diagnosis not present

## 2023-06-06 DIAGNOSIS — L821 Other seborrheic keratosis: Secondary | ICD-10-CM | POA: Diagnosis not present

## 2023-06-06 DIAGNOSIS — Z85828 Personal history of other malignant neoplasm of skin: Secondary | ICD-10-CM | POA: Diagnosis not present

## 2023-06-06 DIAGNOSIS — Z129 Encounter for screening for malignant neoplasm, site unspecified: Secondary | ICD-10-CM | POA: Diagnosis not present

## 2023-06-10 ENCOUNTER — Telehealth: Payer: Self-pay

## 2023-06-10 NOTE — Telephone Encounter (Signed)
Noted../lmb 

## 2023-06-10 NOTE — Telephone Encounter (Signed)
Received notification from [facility] regarding PENDING ON  OZEMPIC. DUE TO MISSING INSURANCE CARD. I HAVE MADE COPY AND SENT TO COMPANY.   PLEASE BE ADVISED     Melanee Spry CPhT [facility] [phone]

## 2023-06-15 ENCOUNTER — Other Ambulatory Visit: Payer: Self-pay | Admitting: Internal Medicine

## 2023-06-17 ENCOUNTER — Encounter: Payer: Self-pay | Admitting: Internal Medicine

## 2023-06-17 ENCOUNTER — Ambulatory Visit (INDEPENDENT_AMBULATORY_CARE_PROVIDER_SITE_OTHER): Payer: Medicare Other

## 2023-06-17 DIAGNOSIS — Z0000 Encounter for general adult medical examination without abnormal findings: Secondary | ICD-10-CM

## 2023-06-17 NOTE — Progress Notes (Signed)
Subjective:   Leon Nelson is a 67 y.o. male who presents for Medicare Annual/Subsequent preventive examination.  Visit Complete: Virtual  I connected with  Leon Nelson on 06/17/23 by a audio enabled telemedicine application and verified that I am speaking with the correct person using two identifiers.  Patient Location: Home  Provider Location: Home Office  I discussed the limitations of evaluation and management by telemedicine. The patient expressed understanding and agreed to proceed.  Patient Medicare AWV questionnaire was completed by the patient on 06/17/2023; I have confirmed that all information answered by patient is correct and no changes since this date.       Objective:    There were no vitals filed for this visit. There is no height or weight on file to calculate BMI.     01/17/2022   10:23 AM 12/07/2020    9:32 AM 01/08/2019    4:59 AM 01/07/2019    7:18 PM  Advanced Directives  Does Patient Have a Medical Advance Directive? Yes Yes No No  Type of Advance Directive Living will;Healthcare Power of Attorney Living will;Healthcare Power of Attorney    Does patient want to make changes to medical advance directive? No - Patient declined No - Patient declined    Copy of Healthcare Power of Attorney in Chart? No - copy requested No - copy requested    Would patient like information on creating a medical advance directive?   No - Patient declined No - Patient declined    Current Medications (verified) Outpatient Encounter Medications as of 06/17/2023  Medication Sig   metFORMIN (GLUCOPHAGE-XR) 500 MG 24 hr tablet Take 2 tablets (1,000 mg total) by mouth daily with breakfast.   allopurinol (ZYLOPRIM) 300 MG tablet Take 1 tablet (300 mg total) by mouth daily.   atenolol-chlorthalidone (TENORETIC) 50-25 MG tablet Take 0.5 tablets by mouth daily.   azithromycin (ZITHROMAX) 250 MG tablet Take 1 tablet (250 mg total) by mouth daily. Take first 2 tablets together,  then 1 every day until finished.   Blood Glucose Monitoring Suppl (ONE TOUCH ULTRA 2) w/Device KIT Use as directed once daily E11.9   dapagliflozin propanediol (FARXIGA) 10 MG TABS tablet Take 1 tablet (10 mg total) by mouth daily before breakfast.   diclofenac sodium (VOLTAREN) 1 % GEL Apply 2 g topically 4 (four) times daily as needed.   dicyclomine (BENTYL) 20 MG tablet TAKE 1 TABLET BY MOUTH 3 TIMES  DAILY AS NEEDED   gabapentin (NEURONTIN) 300 MG capsule Take 1 capsule (300 mg total) by mouth 2 (two) times daily.   glipiZIDE (GLUCOTROL XL) 10 MG 24 hr tablet Take 1 tablet (10 mg total) by mouth daily with breakfast.   glucose blood (ONE TOUCH ULTRA TEST) test strip Use as instructed once daily E11.9   Lancets MISC Use as directed once daily E11.9   levothyroxine (SYNTHROID) 50 MCG tablet Take 1 tablet (50 mcg total) by mouth daily.   lisinopril (ZESTRIL) 20 MG tablet Take 1 tablet (20 mg total) by mouth daily.   potassium chloride SA (KLOR-CON M) 20 MEQ tablet TAKE 2 TABLETS BY MOUTH DAILY   rosuvastatin (CRESTOR) 40 MG tablet Take 1 tablet (40 mg total) by mouth daily.   Semaglutide,0.25 or 0.5MG /DOS, (OZEMPIC, 0.25 OR 0.5 MG/DOSE,) 2 MG/3ML SOPN Take 0.25 mg subq once weekly   solifenacin (VESICARE) 5 MG tablet Take 1 tablet (5 mg total) by mouth daily.   venlafaxine XR (EFFEXOR-XR) 75 MG 24 hr capsule TAKE  1 CAPSULE BY MOUTH DAILY   vitamin B-12 (CYANOCOBALAMIN) 1000 MCG tablet Take 1 tablet (1,000 mcg total) by mouth daily.   No facility-administered encounter medications on file as of 06/17/2023.    Allergies (verified) Penicillins and Sulfonamide derivatives   History: Past Medical History:  Diagnosis Date   ABDOMINAL PAIN OTHER SPECIFIED SITE 07/20/2008   CHEST PAIN 02/06/2011   COLONIC POLYPS, HX OF 05/26/2008   DEPRESSION 05/26/2008   DIABETES MELLITUS, TYPE II 05/26/2008   HYPERLIPIDEMIA 05/26/2008   HYPERTENSION 05/26/2008   HYPOTHYROIDISM 05/26/2008   KNEE PAIN, RIGHT, ACUTE  07/01/2008   Personal history of urinary calculi 05/26/2008   UTI'S, HX OF 05/26/2008   Past Surgical History:  Procedure Laterality Date   s/p right knee arthroplasty      complicated by patellar tendon rupture Jan 2011 Dr. Darrelyn Hillock   TONSILLECTOMY     Family History  Problem Relation Age of Onset   Cancer Mother        breast cancer   Hyperlipidemia Mother    Heart disease Mother    Hyperlipidemia Father    Heart disease Father    Alcohol abuse Other    Diabetes Other    Social History   Socioeconomic History   Marital status: Married    Spouse name: Not on file   Number of children: Not on file   Years of education: Not on file   Highest education level: Not on file  Occupational History   Not on file  Tobacco Use   Smoking status: Never   Smokeless tobacco: Never  Substance and Sexual Activity   Alcohol use: No   Drug use: No   Sexual activity: Not on file  Other Topics Concern   Not on file  Social History Narrative   Not on file   Social Determinants of Health   Financial Resource Strain: Low Risk  (01/17/2022)   Overall Financial Resource Strain (CARDIA)    Difficulty of Paying Living Expenses: Not hard at all  Food Insecurity: No Food Insecurity (01/17/2022)   Hunger Vital Sign    Worried About Running Out of Food in the Last Year: Never true    Ran Out of Food in the Last Year: Never true  Transportation Needs: No Transportation Needs (01/17/2022)   PRAPARE - Administrator, Civil Service (Medical): No    Lack of Transportation (Non-Medical): No  Physical Activity: Inactive (01/17/2022)   Exercise Vital Sign    Days of Exercise per Week: 0 days    Minutes of Exercise per Session: 0 min  Stress: No Stress Concern Present (01/17/2022)   [employer] of Occupational Health - Occupational Stress Questionnaire    Feeling of Stress : Not at all  Social Connections: Socially Integrated (01/17/2022)   Social Connection and Isolation Panel [NHANES]     Frequency of Communication with Friends and Family: More than three times a week    Frequency of Social Gatherings with Friends and Family: Once a week    Attends Religious Services: More than 4 times per year    Active Member of [employer] or Organizations: No    Attends Engineer, structural: More than 4 times per year    Marital Status: Married    Tobacco Counseling Counseling given: Not Answered   Clinical Intake:              How often do you need to have someone help you when you read instructions, pamphlets,  or other written materials from your doctor or pharmacy?: (P) 1 - Never         Activities of Daily Living    06/17/2023   12:49 PM  In your present state of health, do you have any difficulty performing the following activities:  Hearing? 0  Vision? 0  Difficulty concentrating or making decisions? 0  Walking or climbing stairs? 1  Dressing or bathing? 0  Doing errands, shopping? 0  Preparing Food and eating ? N  Using the Toilet? N  In the past six months, have you accidently leaked urine? Y  Do you have problems with loss of bowel control? N  Managing your Medications? N  Managing your Finances? N  Housekeeping or managing your Housekeeping? N    Patient Care Team: Corwin Levins, MD as PCP - General  Indicate any recent Medical Services you may have received from other than [facility] providers in the past year (date may be approximate).     Assessment:   This is a routine wellness examination for [place].  Hearing/Vision screen No results found.  Dietary issues and exercise activities discussed:     Goals Addressed   None   Depression Screen    01/23/2023    8:18 AM 01/23/2023    8:09 AM 01/17/2022   10:24 AM 01/17/2022    9:52 AM 01/17/2022    9:28 AM 05/10/2021    8:45 AM 05/10/2021    8:10 AM  PHQ 2/9 Scores  PHQ - 2 Score 0 0 0 0 0 0 0  PHQ- 9 Score  0         Fall Risk    06/17/2023   12:49 PM 01/23/2023    8:18 AM  01/23/2023    8:09 AM 07/20/2022    8:08 AM 01/17/2022   10:24 AM  Fall Risk   Falls in the past year? 1 0 0 0 0  Number falls in past yr: 0 0 0 0 0  Injury with Fall? 0 0 0 0 0  Risk for fall due to :   No Fall Risks  No Fall Risks  Follow up   Falls evaluation completed  Falls evaluation completed    MEDICARE RISK AT HOME:   TIMED UP AND GO:  Was the test performed?  No    Cognitive Function:        Immunizations Immunization History  Administered Date(s) Administered   Fluad Quad(high Dose 65+) 12/04/2020, 01/17/2022, 01/23/2023   H1N1 01/24/2009   Influenza Split 09/21/2011   Influenza Whole 09/23/2008, 09/23/2009   Influenza, Seasonal, Injecte, Preservative Fre 02/11/2013   Influenza,inj,Quad PF,6+ Mos 01/20/2014, 09/16/2015, 11/06/2019   Influenza-Unspecified 09/22/2016, 10/20/2018   PFIZER(Purple Top)SARS-COV-2 Vaccination 01/25/2020, 02/15/2020, 12/04/2020   Pneumococcal Conjugate-13 11/06/2019   Pneumococcal Polysaccharide-23 12/24/2005, 07/21/2014, 04/24/2021   Td 01/24/2009   Tdap 11/06/2019    TDAP status: Up to date  Flu Vaccine status: Up to date  Pneumococcal vaccine status: Up to date  Covid-19 vaccine status: Declined, Education has been provided regarding the importance of this vaccine but patient still declined. Advised may receive this vaccine at local pharmacy or Health Dept.or vaccine clinic. Aware to provide a copy of the vaccination record if obtained from local pharmacy or Health Dept. Verbalized acceptance and understanding.  Qualifies for Shingles Vaccine? Yes   Zostavax completed No   Shingrix Completed?: No.    Education has been provided regarding the importance of this vaccine. Patient has been advised to  call insurance company to determine out of pocket expense if they have not yet received this vaccine. Advised may also receive vaccine at local pharmacy or Health Dept. Verbalized acceptance and understanding.  Screening Tests Health  Maintenance  Topic Date Due   HEMOGLOBIN A1C  07/24/2023   INFLUENZA VACCINE  07/25/2023   Diabetic kidney evaluation - eGFR measurement  01/24/2024   Diabetic kidney evaluation - Urine ACR  01/24/2024   FOOT EXAM  01/24/2024   OPHTHALMOLOGY EXAM  01/29/2024   Medicare Annual Wellness (AWV)  06/16/2024   Colonoscopy  11/01/2027   DTaP/Tdap/Td (3 - Td or Tdap) 11/05/2029   Pneumonia Vaccine 61+ Years old  Completed   Hepatitis C Screening  Completed   HPV VACCINES  Aged Out   COVID-19 Vaccine  Discontinued   Zoster Vaccines- Shingrix  Discontinued    Health Maintenance  There are no preventive care reminders to display for this patient.   Colorectal cancer screening: Type of screening: Colonoscopy. Completed 2023. Repeat every 3 years  Lung Cancer Screening: (Low Dose CT Chest recommended if Age 15-80 years, 20 pack-year currently smoking OR have quit w/in 15years.) does qualify.   Lung Cancer Screening Referral: na  Additional Screening:  Hepatitis C Screening: does qualify; Completed 2023  Vision Screening: Recommended annual ophthalmology exams for early detection of glaucoma and other disorders of the eye. Is the patient up to date with their annual eye exam?  Yes  Who is the provider or what is the name of the office in which the patient attends annual eye exams? [email or web address] If pt is not established with a provider, would they like to be referred to a provider to establish care?  established .   Dental Screening: Recommended annual dental exams for proper oral hygiene  Diabetic Foot Exam: Diabetic Foot Exam: Completed dr [email or web address]  Community Resource Referral / Chronic Care Management: CRR required this visit?  No   CCM required this visit?  No     Plan:     I have personally reviewed and noted the following in the patient's chart:   Medical and social history Use of alcohol, tobacco or illicit drugs  Current medications and supplements including opioid  prescriptions. Patient is not currently taking opioid prescriptions. Functional ability and status Nutritional status Physical activity Advanced directives List of other physicians Hospitalizations, surgeries, and ER visits in previous 12 months Vitals Screenings to include cognitive, depression, and falls Referrals and appointments  In addition, I have reviewed and discussed with patient certain preventive protocols, quality metrics, and best practice recommendations. A written personalized care plan for preventive services as well as general preventive health recommendations were provided to patient.     Delana Meyer   06/17/2023   After Visit Summary: (MyChart) Due to this being a telephonic visit, the after visit summary with patients personalized plan was offered to patient via MyChart   Nurse Notes: pt interested in shigrx at next appt

## 2023-06-17 NOTE — Patient Instructions (Signed)
Advance Directive  Advance directives are legal documents that allow you to make decisions about your health care and medical treatment in case you become unable to communicate for yourself. Advance directives let your wishes be known to family, friends, and health care providers. Discussing and writing advance directives should happen over time rather than all at once. Advance directives can be changed and updated at any time. There are different types of advance directives, such as: Medical power of attorney. Living will. Do not resuscitate (DNR) order or do not attempt resuscitation (DNAR) order. Health care proxy and medical power of attorney A health care proxy is also called a health care agent. This person is appointed to make medical decisions for you when you are unable to make decisions for yourself. Generally, people ask a trusted friend or family member to act as their proxy and represent their preferences. Make sure you have an agreement with your trusted person to act as your proxy. A proxy may have to make a medical decision on your behalf if your wishes are not known. A medical power of attorney, also called a durable power of attorney for health care, is a legal document that names your health care proxy. Depending on the laws in your state, the document may need to be: Signed. Notarized. Dated. Copied. Witnessed. Incorporated into your medical record. You may also want to appoint a trusted person to manage your money in the event you are unable to do so. This is called a durable power of attorney for finances. It is a separate legal document from the durable power of attorney for health care. You may choose your health care proxy or someone different to act as your agent in money matters. If you do not appoint a proxy, or there is a concern that the proxy is not acting in your best interest, a court may appoint a guardian to act on your behalf. Living will A living will is a set  of instructions that state your wishes about medical care when you cannot express them yourself. Health care providers should keep a copy of your living will in your medical record. You may want to give a copy to family members or friends. To alert caregivers in case of an emergency, you can place a card in your wallet to let them know that you have a living will and where they can find it. A living will is used if you become: Terminally ill. Disabled. Unable to communicate or make decisions. The following decisions should be included in your living will: To use or not to use life support equipment, such as dialysis machines and breathing machines (ventilators). Whether you want a DNR or DNAR order. This tells health care providers not to use cardiopulmonary resuscitation (CPR) if breathing or heartbeat stops. To use or not to use tube feeding. To be given or not to be given food and fluids. Whether you want comfort (palliative) care when the goal becomes comfort rather than a cure. Whether you want to donate your organs and tissues. A living will does not give instructions for distributing your money and property if you should pass away. DNR or DNAR A DNR or DNAR order is a request not to have CPR in the event that your heart stops beating or you stop breathing. If a DNR or DNAR order has not been made and shared, a health care provider will try to help any patient whose heart has stopped or who has stopped breathing.   If you plan to have surgery, talk with your health care provider about how your DNR or DNAR order will be followed if problems occur. What if I do not have an advance directive? Some states assign family decision makers to act on your behalf if you do not have an advance directive. Each state has its own laws about advance directives. You may want to check with your health care provider, attorney, or state representative about the laws in your state. Summary Advance directives are  legal documents that allow you to make decisions about your health care and medical treatment in case you become unable to communicate for yourself. The process of discussing and writing advance directives should happen over time. You can change and update advance directives at any time. Advance directives may include a medical power of attorney, a living will, and a DNR or DNAR order. This information is not intended to replace advice given to you by your health care provider. Make sure you discuss any questions you have with your health care provider. Document Revised: 09/13/2020 Document Reviewed: 09/13/2020 Elsevier Patient Education  2024 [employer]. Health Maintenance, Male Adopting a healthy lifestyle and getting preventive care are important in promoting health and wellness. Ask your health care provider about: The right schedule for you to have regular tests and exams. Things you can do on your own to prevent diseases and keep yourself healthy. What should I know about diet, weight, and exercise? Eat a healthy diet  Eat a diet that includes plenty of vegetables, fruits, low-fat dairy products, and lean protein. Do not eat a lot of foods that are high in solid fats, added sugars, or sodium. Maintain a healthy weight Body mass index (BMI) is a measurement that can be used to identify possible weight problems. It estimates body fat based on height and weight. Your health care provider can help determine your BMI and help you achieve or maintain a healthy weight. Get regular exercise Get regular exercise. This is one of the most important things you can do for your health. Most adults should: Exercise for at least 150 minutes each week. The exercise should increase your heart rate and make you sweat (moderate-intensity exercise). Do strengthening exercises at least twice a week. This is in addition to the moderate-intensity exercise. Spend less time sitting. Even light physical activity  can be beneficial. Watch cholesterol and blood lipids Have your blood tested for lipids and cholesterol at 69 years of age, then have this test every 5 years. You may need to have your cholesterol levels checked more often if: Your lipid or cholesterol levels are high. You are older than 69 years of age. You are at high risk for heart disease. What should I know about cancer screening? Many types of cancers can be detected early and may often be prevented. Depending on your health history and family history, you may need to have cancer screening at various ages. This may include screening for: Colorectal cancer. Prostate cancer. Skin cancer. Lung cancer. What should I know about heart disease, diabetes, and high blood pressure? Blood pressure and heart disease High blood pressure causes heart disease and increases the risk of stroke. This is more likely to develop in people who have high blood pressure readings or are overweight. Talk with your health care provider about your target blood pressure readings. Have your blood pressure checked: Every 3-5 years if you are 25-25 years of age. Every year if you are 72 years old or  older. If you are between the ages of 97 and 35 and are a current or former smoker, ask your health care provider if you should have a one-time screening for abdominal aortic aneurysm (AAA). Diabetes Have regular diabetes screenings. This checks your fasting blood sugar level. Have the screening done: Once every three years after age 29 if you are at a normal weight and have a low risk for diabetes. More often and at a younger age if you are overweight or have a high risk for diabetes. What should I know about preventing infection? Hepatitis B If you have a higher risk for hepatitis B, you should be screened for this virus. Talk with your health care provider to find out if you are at risk for hepatitis B infection. Hepatitis C Blood testing is recommended  for: Everyone born from [birth year] through 1967. Anyone with known risk factors for hepatitis C. Sexually transmitted infections (STIs) You should be screened each year for STIs, including gonorrhea and chlamydia, if: You are sexually active and are younger than 69 years of age. You are older than 69 years of age and your health care provider tells you that you are at risk for this type of infection. Your sexual activity has changed since you were last screened, and you are at increased risk for chlamydia or gonorrhea. Ask your health care provider if you are at risk. Ask your health care provider about whether you are at high risk for HIV. Your health care provider may recommend a prescription medicine to help prevent HIV infection. If you choose to take medicine to prevent HIV, you should first get tested for HIV. You should then be tested every 3 months for as long as you are taking the medicine. Follow these instructions at home: Alcohol use Do not drink alcohol if your health care provider tells you not to drink. If you drink alcohol: Limit how much you have to 0-2 drinks a day. Know how much alcohol is in your drink. In the [place] one drink equals one 12 oz bottle of beer (355 mL), one 5 oz glass of wine (148 mL), or one 1 oz glass of hard liquor (44 mL). Lifestyle Do not use any products that contain nicotine or tobacco. These products include cigarettes, chewing tobacco, and vaping devices, such as e-cigarettes. If you need help quitting, ask your health care provider. Do not use street drugs. Do not share needles. Ask your health care provider for help if you need support or information about quitting drugs. General instructions Schedule regular health, dental, and eye exams. Stay current with your vaccines. Tell your health care provider if: You often feel depressed. You have ever been abused or do not feel safe at home. Summary Adopting a healthy lifestyle and getting preventive care  are important in promoting health and wellness. Follow your health care provider's instructions about healthy diet, exercising, and getting tested or screened for diseases. Follow your health care provider's instructions on monitoring your cholesterol and blood pressure. This information is not intended to replace advice given to you by your health care provider. Make sure you discuss any questions you have with your health care provider. Document Revised: 05/01/2021 Document Reviewed: 05/01/2021 Elsevier Patient Education  2024 [employer].

## 2023-06-21 ENCOUNTER — Telehealth: Payer: Self-pay | Admitting: *Deleted

## 2023-06-21 NOTE — Telephone Encounter (Signed)
Called patient and informed him that his patient assistance was in the office. Patient states he will come by today or next week to pick up. Medication is located at the nursing station fridge.

## 2023-06-28 ENCOUNTER — Other Ambulatory Visit: Payer: Self-pay | Admitting: Internal Medicine

## 2023-07-16 NOTE — Telephone Encounter (Signed)
Called pt no answer LMOM w/ status for the Ozempic.Marland KitchenRaechel Nelson

## 2023-07-16 NOTE — Telephone Encounter (Signed)
Received notification from [employer] regarding approval for OZEMPIC. Patient assistance APPROVED from 07/16/2023 to 12/24/2023.  Phone: [phone]  PLEASE BE ADVISED HAVE PT CALL NUMBER ABOVE CHECK STATUS OF SHIPPING

## 2023-07-24 ENCOUNTER — Encounter: Payer: Self-pay | Admitting: Internal Medicine

## 2023-07-24 ENCOUNTER — Ambulatory Visit (INDEPENDENT_AMBULATORY_CARE_PROVIDER_SITE_OTHER): Payer: Medicare Other | Admitting: Internal Medicine

## 2023-07-24 ENCOUNTER — Other Ambulatory Visit: Payer: Self-pay | Admitting: Internal Medicine

## 2023-07-24 VITALS — BP 100/80 | HR 74 | Temp 98.3°F | Ht 72.0 in | Wt 290.0 lb

## 2023-07-24 DIAGNOSIS — E1149 Type 2 diabetes mellitus with other diabetic neurological complication: Secondary | ICD-10-CM | POA: Diagnosis not present

## 2023-07-24 DIAGNOSIS — E039 Hypothyroidism, unspecified: Secondary | ICD-10-CM | POA: Diagnosis not present

## 2023-07-24 DIAGNOSIS — E559 Vitamin D deficiency, unspecified: Secondary | ICD-10-CM | POA: Diagnosis not present

## 2023-07-24 DIAGNOSIS — I10 Essential (primary) hypertension: Secondary | ICD-10-CM | POA: Diagnosis not present

## 2023-07-24 DIAGNOSIS — Z7984 Long term (current) use of oral hypoglycemic drugs: Secondary | ICD-10-CM

## 2023-07-24 DIAGNOSIS — E782 Mixed hyperlipidemia: Secondary | ICD-10-CM

## 2023-07-24 DIAGNOSIS — E538 Deficiency of other specified B group vitamins: Secondary | ICD-10-CM

## 2023-07-24 LAB — LIPID PANEL
Cholesterol: 100 mg/dL (ref 0–200)
HDL: 35.4 mg/dL — ABNORMAL LOW (ref >39.00)
LDL Cholesterol: 40 mg/dL (ref 0–99)
NonHDL: 64.9
Total CHOL/HDL Ratio: 3
Triglycerides: 127 mg/dL (ref 0.0–149.0)
VLDL: 25.4 mg/dL (ref 0.0–40.0)

## 2023-07-24 LAB — HEPATIC FUNCTION PANEL
ALT: 27 U/L (ref 0–53)
AST: 21 U/L (ref 0–37)
Albumin: 4.3 g/dL (ref 3.5–5.2)
Alkaline Phosphatase: 41 U/L (ref 39–117)
Bilirubin, Direct: 0.1 mg/dL (ref 0.0–0.3)
Total Bilirubin: 0.3 mg/dL (ref 0.2–1.2)
Total Protein: 7.1 g/dL (ref 6.0–8.3)

## 2023-07-24 LAB — BASIC METABOLIC PANEL
BUN: 20 mg/dL (ref 6–23)
CO2: 26 mEq/L (ref 19–32)
Calcium: 9.4 mg/dL (ref 8.4–10.5)
Chloride: 104 mEq/L (ref 96–112)
Creatinine, Ser: 0.83 mg/dL (ref 0.40–1.50)
GFR: 89.86 mL/min (ref >60.00)
Glucose, Bld: 206 mg/dL — ABNORMAL HIGH (ref 70–99)
Potassium: 4 mEq/L (ref 3.5–5.1)
Sodium: 139 mEq/L (ref 135–145)

## 2023-07-24 LAB — HEMOGLOBIN A1C: Hgb A1c MFr Bld: 8.5 % — ABNORMAL HIGH (ref 4.6–6.5)

## 2023-07-24 LAB — VITAMIN D 25 HYDROXY (VIT D DEFICIENCY, FRACTURES): VITD: 68.61 ng/mL (ref 30.00–100.00)

## 2023-07-24 LAB — VITAMIN B12: Vitamin B-12: 713 pg/mL (ref 211–911)

## 2023-07-24 MED ORDER — METFORMIN HCL ER 500 MG PO TB24: 1000.0000 mg | ORAL_TABLET | Freq: Two times a day (BID) | ORAL | 3 refills | Status: DC

## 2023-07-24 NOTE — Progress Notes (Signed)
Patient ID: Leon Nelson, male   DOB: August 21, 1956, 67 y.o.   MRN: [id number]        Chief Complaint: follow up HTN, HLD and hyperglycemia , low thyroid       HPI:  Leon Nelson is a 67 y.o. male here overall doing ok, Pt denies chest pain, increased sob or doe, wheezing, orthopnea, PND, increased LE swelling, palpitations, dizziness or syncope.   Pt denies polydipsia, polyuria, or new focal neuro s/s.    Pt denies fever, wt loss, night sweats, loss of appetite, or other constitutional symptoms         Wt Readings from Last 3 Encounters:  07/24/23 290 lb (131.5 kg)  01/23/23 295 lb (133.8 kg)  08/29/22 293 lb (132.9 kg)   BP Readings from Last 3 Encounters:  07/24/23 100/80  03/30/23 (!) 145/80  01/23/23 128/72         Past Medical History:  Diagnosis Date   ABDOMINAL PAIN OTHER SPECIFIED SITE 07/20/2008   CHEST PAIN 02/06/2011   COLONIC POLYPS, HX OF 05/26/2008   DEPRESSION 05/26/2008   DIABETES MELLITUS, TYPE II 05/26/2008   HYPERLIPIDEMIA 05/26/2008   HYPERTENSION 05/26/2008   HYPOTHYROIDISM 05/26/2008   KNEE PAIN, RIGHT, ACUTE 07/01/2008   Personal history of urinary calculi 05/26/2008   UTI'S, HX OF 05/26/2008   Past Surgical History:  Procedure Laterality Date   s/p right knee arthroplasty      complicated by patellar tendon rupture Jan 2011 Dr. Darrelyn Hillock   TONSILLECTOMY      reports that he has never smoked. He has never used smokeless tobacco. He reports that he does not drink alcohol and does not use drugs. family history includes Alcohol abuse in an other family member; Cancer in his mother; Diabetes in an other family member; Heart disease in his father and mother; Hyperlipidemia in his father and mother. Allergies  Allergen Reactions   Penicillins Other (See Comments)    Blisters on hands   Sulfonamide Derivatives Other (See Comments)    Blisters in groin area   Current Outpatient Medications on File Prior to Visit  Medication Sig Dispense Refill   allopurinol (ZYLOPRIM)  300 MG tablet Take 1 tablet (300 mg total) by mouth daily. 100 tablet 3   atenolol-chlorthalidone (TENORETIC) 50-25 MG tablet Take 0.5 tablets by mouth daily. 50 tablet 3   Blood Glucose Monitoring Suppl (ONE TOUCH ULTRA 2) w/Device KIT Use as directed once daily E11.9 1 each 0   dapagliflozin propanediol (FARXIGA) 10 MG TABS tablet Take 1 tablet (10 mg total) by mouth daily before breakfast. 90 tablet 3   diclofenac sodium (VOLTAREN) 1 % GEL Apply 2 g topically 4 (four) times daily as needed. 200 g 5   dicyclomine (BENTYL) 20 MG tablet TAKE 1 TABLET BY MOUTH 3 TIMES  DAILY AS NEEDED 300 tablet 3   gabapentin (NEURONTIN) 300 MG capsule TAKE 1 CAPSULE BY MOUTH TWICE  DAILY 200 capsule 1   glipiZIDE (GLUCOTROL XL) 10 MG 24 hr tablet Take 1 tablet (10 mg total) by mouth daily with breakfast. 90 tablet 3   glucose blood (ONE TOUCH ULTRA TEST) test strip Use as instructed once daily E11.9 100 each 12   ketoconazole (NIZORAL) 2 % shampoo Apply 1 Application topically 3 (three) times a week.     Lancets MISC Use as directed once daily E11.9 100 each 3   levothyroxine (SYNTHROID) 50 MCG tablet Take 1 tablet (50 mcg total) by mouth daily. 90 tablet  3   lisinopril (ZESTRIL) 20 MG tablet Take 1 tablet (20 mg total) by mouth daily. 100 tablet 3   potassium chloride SA (KLOR-CON M) 20 MEQ tablet TAKE 2 TABLETS BY MOUTH DAILY 200 tablet 1   rosuvastatin (CRESTOR) 40 MG tablet Take 1 tablet (40 mg total) by mouth daily. 90 tablet 3   Semaglutide,0.25 or 0.5MG /DOS, (OZEMPIC, 0.25 OR 0.5 MG/DOSE,) 2 MG/3ML SOPN Take 0.25 mg subq once weekly 9 mL 3   solifenacin (VESICARE) 5 MG tablet Take 1 tablet (5 mg total) by mouth daily. 90 tablet 3   venlafaxine XR (EFFEXOR-XR) 75 MG 24 hr capsule TAKE 1 CAPSULE BY MOUTH DAILY 100 capsule 2   No current facility-administered medications on file prior to visit.        ROS:  All others reviewed and negative.  Objective        PE:  BP 100/80 (BP Location: Right Arm,  Patient Position: Sitting, Cuff Size: Normal)   Pulse 74   Temp 98.3 F (36.8 C) (Oral)   Ht 6' (1.829 m)   Wt 290 lb (131.5 kg)   SpO2 96%   BMI 39.33 kg/m                 Constitutional: Pt appears in NAD               HENT: Head: NCAT.                Right Ear: External ear normal.                 Left Ear: External ear normal.                Eyes: . Pupils are equal, round, and reactive to light. Conjunctivae and EOM are normal               Nose: without d/c or deformity               Neck: Neck supple. Gross normal ROM               Cardiovascular: Normal rate and regular rhythm.                 Pulmonary/Chest: Effort normal and breath sounds without rales or wheezing.                Abd:  Soft, NT, ND, + BS, no organomegaly               Neurological: Pt is alert. At baseline orientation, motor grossly intact               Skin: Skin is warm. No rashes, no other new lesions, LE edema - none               Psychiatric: Pt behavior is normal without agitation   Micro: none  Cardiac tracings I have personally interpreted today:  none  Pertinent Radiological findings (summarize): none   Lab Results  Component Value Date   WBC 7.6 01/23/2023   HGB 14.8 01/23/2023   HCT 43.5 01/23/2023   PLT 205.0 01/23/2023   GLUCOSE 206 (H) 07/24/2023   CHOL 100 07/24/2023   TRIG 127.0 07/24/2023   HDL 35.40 (L) 07/24/2023   LDLDIRECT 72.0 07/17/2022   LDLCALC 40 07/24/2023   ALT 27 07/24/2023   AST 21 07/24/2023   NA 139 07/24/2023   K 4.0 07/24/2023   CL 104 07/24/2023   CREATININE  0.83 07/24/2023   BUN 20 07/24/2023   CO2 26 07/24/2023   TSH 5.00 01/23/2023   PSA 0.06 (L) 01/23/2023   INR 2.0 ratio (H) 01/16/2010   HGBA1C 8.5 (H) 07/24/2023   MICROALBUR 0.7 01/23/2023   Assessment/Plan:  Leon Nelson is a 67 y.o. White or Caucasian [1] male with  has a past medical history of ABDOMINAL PAIN OTHER SPECIFIED SITE (07/20/2008), CHEST PAIN (02/06/2011), COLONIC POLYPS, HX  OF (05/26/2008), DEPRESSION (05/26/2008), DIABETES MELLITUS, TYPE II (05/26/2008), HYPERLIPIDEMIA (05/26/2008), HYPERTENSION (05/26/2008), HYPOTHYROIDISM (05/26/2008), KNEE PAIN, RIGHT, ACUTE (07/01/2008), Personal history of urinary calculi (05/26/2008), and UTI'S, HX OF (05/26/2008).  Hypothyroidism Lab Results  Component Value Date   TSH 5.00 01/23/2023   Stable, pt to continue levothyroxine 50 mcg qd   Diabetes mellitus type 2 with neurological manifestations (HCC) Lab Results  Component Value Date   HGBA1C 8.5 (H) 07/24/2023   Uncontrolled, pt to continue current medical treatment farxiga 10 every day, glipizide xl 10  every day, ozempic 2 mg weekly but increase the metfomrin to 1000 bid   Hyperlipidemia Lab Results  Component Value Date   LDLCALC 40 07/24/2023   Stable, pt to continue current statin crestor 40 qd   Essential hypertension BP Readings from Last 3 Encounters:  07/24/23 100/80  03/30/23 (!) 145/80  01/23/23 128/72   ? Overcontrolled, pt denies any symptoms and controlled ok at home, pt to continue medical treatment tenormin 50 25 every day, lisinopril 20 qd   Vitamin D deficiency Last vitamin D Lab Results  Component Value Date   VD25OH 68.61 07/24/2023   Stable, cont oral replacement   B12 deficiency Lab Results  Component Value Date   VITAMINB12 713 07/24/2023   Stable, cont oral replacement - b12 1000 mcg qd  Followup: Return in about 6 months (around 01/24/2024).  Oliver Barre, MD 07/27/2023 9:14 PM [facility]

## 2023-07-24 NOTE — Patient Instructions (Addendum)
Please let us know about which glucometer is covered by your insurance; or consider one of the CGM monitors such as the Libre3 or Dexcom with the sensor technology  Please continue all other medications as before, and refills have been done if requested.  Please have the pharmacy call with any other refills you may need.  Please continue your efforts at being more active, low cholesterol diet, and weight control.  Please keep your appointments with your specialists as you may have planned  Please go to the LAB at the blood drawing area for the tests to be done  You will be contacted by phone if any changes need to be made immediately.  Otherwise, you will receive a letter about your results with an explanation, but please check with MyChart first.  Please remember to sign up for MyChart if you have not done so, as this will be important to you in the future with finding out test results, communicating by private email, and scheduling acute appointments online when needed.  Please make an Appointment to return in 6 months, or sooner if needed

## 2023-07-25 ENCOUNTER — Encounter: Payer: Self-pay | Admitting: Internal Medicine

## 2023-07-27 ENCOUNTER — Encounter: Payer: Self-pay | Admitting: Internal Medicine

## 2023-07-27 NOTE — Assessment & Plan Note (Signed)
Lab Results  Component Value Date   LDLCALC 40 07/24/2023   Stable, pt to continue current statin crestor 40 qd

## 2023-07-27 NOTE — Assessment & Plan Note (Signed)
Lab Results  Component Value Date   HGBA1C 8.5 (H) 07/24/2023   Uncontrolled, pt to continue current medical treatment farxiga 10 every day, glipizide xl 10  every day, ozempic 2 mg weekly but increase the metfomrin to 1000 bid

## 2023-07-27 NOTE — Assessment & Plan Note (Addendum)
BP Readings from Last 3 Encounters:  07/24/23 100/80  03/30/23 (!) 145/80  01/23/23 128/72   ? Overcontrolled, pt denies any symptoms and controlled ok at home, pt to continue medical treatment tenormin 50 25 every day, lisinopril 20 qd

## 2023-07-27 NOTE — Assessment & Plan Note (Signed)
Lab Results  Component Value Date   VITAMINB12 713 07/24/2023   Stable, cont oral replacement - b12 1000 mcg qd

## 2023-07-27 NOTE — Assessment & Plan Note (Signed)
Lab Results  Component Value Date   TSH 5.00 01/23/2023   Stable, pt to continue levothyroxine 50 mcg qd

## 2023-07-27 NOTE — Assessment & Plan Note (Signed)
Last vitamin D Lab Results  Component Value Date   VD25OH 68.61 07/24/2023   Stable, cont oral replacement

## 2023-07-29 MED ORDER — ONETOUCH ULTRA 2 W/DEVICE KIT: PACK | 0 refills | Status: DC

## 2023-07-29 MED ORDER — LANCETS MISC: 3 refills | Status: DC

## 2023-07-29 MED ORDER — GLUCOSE BLOOD VI STRP: ORAL_STRIP | 3 refills | Status: DC

## 2023-08-08 ENCOUNTER — Ambulatory Visit (INDEPENDENT_AMBULATORY_CARE_PROVIDER_SITE_OTHER): Payer: Medicare Other | Admitting: Podiatry

## 2023-08-08 ENCOUNTER — Other Ambulatory Visit: Payer: Self-pay

## 2023-08-08 DIAGNOSIS — Q828 Other specified congenital malformations of skin: Secondary | ICD-10-CM

## 2023-08-08 DIAGNOSIS — M79675 Pain in left toe(s): Secondary | ICD-10-CM | POA: Diagnosis not present

## 2023-08-08 DIAGNOSIS — E1149 Type 2 diabetes mellitus with other diabetic neurological complication: Secondary | ICD-10-CM | POA: Diagnosis not present

## 2023-08-08 DIAGNOSIS — B351 Tinea unguium: Secondary | ICD-10-CM

## 2023-08-08 DIAGNOSIS — M79674 Pain in right toe(s): Secondary | ICD-10-CM

## 2023-08-08 MED ORDER — ONETOUCH DELICA LANCETS 30G MISC: 1 refills | Status: DC

## 2023-08-08 NOTE — Progress Notes (Signed)
Subjective: Chief Complaint  Patient presents with   Nail Problem    Nail trim and calluses     67 year old male presents the office today for diabetic foot evaluation and for calluses to both of his great toes as well as the toenails he cannot trim himself.  No open lesions and no new concerns.   Last A1c was 8.5 on 07/24/2023 Corwin Levins, MD Last seen 07/24/2023  Objective: AAO x3, NAD DP/PT pulses palpable bilaterally, CRT less than 3 seconds Sensation decreased with SWMF.  Nails are hypertrophic, dystrophic, brittle, discolored, elongated 10. No surrounding redness or drainage. Tenderness nails 1-5 bilaterally.   Hyperkeratotic lesions to bilateral hallux and left submetatarsal 2.  Upon debridement no underlying ulceration, drainage or any signs of infection.  Flatfoot present bilateral.  No other open lesions or pre-ulcerative lesions are identified today.No pain with calf compression, swelling, warmth, erythema  Assessment: 67 year old male with symptomatic onychomycosis, hyperkeratotic lesions; neuropathy  Plan: -All treatment options discussed with the patient including all alternatives, risks, complications.  -Nails debrided x10 without any complications or bleeding -Hyperkeratotic lesion sharply treated x 3 without complications -Continue gabapentin.  Monitor neuropathy symptoms.  -Discussed daily foot inspection, glucose control  Vivi Barrack DPM

## 2023-08-13 ENCOUNTER — Telehealth: Payer: Self-pay | Admitting: Internal Medicine

## 2023-08-13 NOTE — Telephone Encounter (Signed)
Placed on providers desk

## 2023-08-13 NOTE — Telephone Encounter (Signed)
We have received Patient Assistance forms in the fax to be filled out and they have been placed in the provider's box  Please fax to: [phone]

## 2023-08-15 NOTE — Telephone Encounter (Signed)
Forms have been faxed 

## 2023-09-02 ENCOUNTER — Telehealth: Payer: Self-pay | Admitting: Internal Medicine

## 2023-09-02 NOTE — Telephone Encounter (Signed)
[facility] called and said patient's levothyroxine is changing manufacturers to [employer]. They were calling to verify that the change is okay. Order number is [id number]. Best callback is [phone].

## 2023-09-03 NOTE — Telephone Encounter (Signed)
Ok for change as mentioned

## 2023-09-03 NOTE — Telephone Encounter (Signed)
Called and let pharmacy know

## 2023-09-13 ENCOUNTER — Ambulatory Visit (INDEPENDENT_AMBULATORY_CARE_PROVIDER_SITE_OTHER): Payer: Medicare Other | Admitting: Internal Medicine

## 2023-09-13 VITALS — BP 104/66 | HR 76 | Temp 98.5°F | Ht 72.0 in | Wt 289.8 lb

## 2023-09-13 DIAGNOSIS — K921 Melena: Secondary | ICD-10-CM | POA: Insufficient documentation

## 2023-09-13 DIAGNOSIS — Z23 Encounter for immunization: Secondary | ICD-10-CM | POA: Diagnosis not present

## 2023-09-13 DIAGNOSIS — J309 Allergic rhinitis, unspecified: Secondary | ICD-10-CM | POA: Diagnosis not present

## 2023-09-13 DIAGNOSIS — K5900 Constipation, unspecified: Secondary | ICD-10-CM | POA: Diagnosis not present

## 2023-09-13 DIAGNOSIS — E559 Vitamin D deficiency, unspecified: Secondary | ICD-10-CM

## 2023-09-13 DIAGNOSIS — H539 Unspecified visual disturbance: Secondary | ICD-10-CM | POA: Diagnosis not present

## 2023-09-13 NOTE — Progress Notes (Unsigned)
Patient ID: Leon Nelson, male   DOB: [birth year], 67 y.o.   MRN: [id number]        Chief Complaint: follow up  Chief Complaint  Patient presents with   Seeing Colors     Wednesday at about 5:15 while he was watching tv and he started seeing colored floaters for about 30 mins and then it went away.    Constipation    Patient states that he's been having constipation since he started the ozempic medication. This past Saturday he had a lot of bleeding from his rectum due to him straining. He also didn't take his ozempic this past Sunday due to the constipation and bleeding.    Referral    To GI that does his colonoscopy and also to [facility]. Marland Kitchen           HPI:  Leon Nelson is a 67 y.o. male here with c/o transient episode x 30 min of bilateral vision with colored periphery, more obvious to move the eyes, no pain and no symtpoms since that time.  Pt denies chest pain, increased sob or doe, wheezing, orthopnea, PND, increased LE swelling, palpitations, dizziness or syncope.   Pt denies polydipsia, polyuria, or new focal neuro s/s.    Pt denies fever, wt loss, night sweats, loss of appetite, or other constitutional symptoms  Also incidentally with 1 episode small volume hematochezia x 1 wk, but Denies worsening reflux, abd pain, dysphagia, n/v, bowel change  Has had signficiant constipation with straining in the past 2 wks.  Does have several wks ongoing nasal allergy symptoms with clearish congestion, itch and sneezing, without fever, pain, ST, cough, swelling or wheezing.       Wt Readings from Last 3 Encounters:  09/13/23 289 lb 12.8 oz (131.5 kg)  07/24/23 290 lb (131.5 kg)  01/23/23 295 lb (133.8 kg)   BP Readings from Last 3 Encounters:  09/13/23 104/66  07/24/23 100/80  03/30/23 (!) 145/80         Past Medical History:  Diagnosis Date   ABDOMINAL PAIN OTHER SPECIFIED SITE 07/20/2008   CHEST PAIN 02/06/2011   COLONIC POLYPS, HX OF  05/26/2008   DEPRESSION 05/26/2008   DIABETES MELLITUS, TYPE II 05/26/2008   HYPERLIPIDEMIA 05/26/2008   HYPERTENSION 05/26/2008   HYPOTHYROIDISM 05/26/2008   KNEE PAIN, RIGHT, ACUTE 07/01/2008   Personal history of urinary calculi 05/26/2008   UTI'S, HX OF 05/26/2008   Past Surgical History:  Procedure Laterality Date   s/p right knee arthroplasty      complicated by patellar tendon rupture Jan 2011 Dr. Darrelyn Hillock   TONSILLECTOMY      reports that he has never smoked. He has never used smokeless tobacco. He reports that he does not drink alcohol and does not use drugs. family history includes Alcohol abuse in an other family member; Cancer in his mother; Diabetes in an other family member; Heart disease in his father and mother; Hyperlipidemia in his father and mother. Allergies  Allergen Reactions   Penicillins Other (See Comments)    Blisters on hands   Sulfonamide Derivatives Other (See Comments)    Blisters in groin area   Current Outpatient Medications on File Prior to Visit  Medication Sig Dispense Refill   allopurinol (ZYLOPRIM) 300 MG tablet Take 1 tablet (300 mg total) by mouth daily. 100 tablet 3   atenolol-chlorthalidone (TENORETIC) 50-25 MG tablet Take 0.5 tablets by mouth daily. 50 tablet 3  Blood Glucose Monitoring Suppl (ONE TOUCH ULTRA 2) w/Device KIT Use as directed once daily E11.9 1 kit 0   dapagliflozin propanediol (FARXIGA) 10 MG TABS tablet Take 1 tablet (10 mg total) by mouth daily before breakfast. 90 tablet 3   diclofenac sodium (VOLTAREN) 1 % GEL Apply 2 g topically 4 (four) times daily as needed. 200 g 5   dicyclomine (BENTYL) 20 MG tablet TAKE 1 TABLET BY MOUTH 3 TIMES  DAILY AS NEEDED 300 tablet 3   gabapentin (NEURONTIN) 300 MG capsule TAKE 1 CAPSULE BY MOUTH TWICE  DAILY 200 capsule 1   glipiZIDE (GLUCOTROL XL) 10 MG 24 hr tablet Take 1 tablet (10 mg total) by mouth daily with breakfast. 90 tablet 3   glucose blood (ONE TOUCH ULTRA TEST) test strip Use as instructed  once daily E11.9 100 each 3   ketoconazole (NIZORAL) 2 % shampoo Apply 1 Application topically 3 (three) times a week.     Lancets MISC Use as directed once daily E11.9 100 each 3   levothyroxine (SYNTHROID) 50 MCG tablet Take 1 tablet (50 mcg total) by mouth daily. 90 tablet 3   lisinopril (ZESTRIL) 20 MG tablet Take 1 tablet (20 mg total) by mouth daily. 100 tablet 3   metFORMIN (GLUCOPHAGE-XR) 500 MG 24 hr tablet Take 2 tablets (1,000 mg total) by mouth 2 (two) times daily with a meal. 360 tablet 3   OneTouch Delica Lancets 30G MISC Use as directed once daily 100 each 1   potassium chloride SA (KLOR-CON M) 20 MEQ tablet TAKE 2 TABLETS BY MOUTH DAILY 200 tablet 1   rosuvastatin (CRESTOR) 40 MG tablet Take 1 tablet (40 mg total) by mouth daily. 90 tablet 3   solifenacin (VESICARE) 5 MG tablet Take 1 tablet (5 mg total) by mouth daily. 90 tablet 3   venlafaxine XR (EFFEXOR-XR) 75 MG 24 hr capsule TAKE 1 CAPSULE BY MOUTH DAILY 100 capsule 2   Semaglutide,0.25 or 0.5MG /DOS, (OZEMPIC, 0.25 OR 0.5 MG/DOSE,) 2 MG/3ML SOPN Take 0.25 mg subq once weekly (Patient not taking: Reported on 09/13/2023) 9 mL 3   No current facility-administered medications on file prior to visit.        ROS:  All others reviewed and negative.  Objective        PE:  BP 104/66 (BP Location: Left Arm, Patient Position: Sitting, Cuff Size: Large)   Pulse 76   Temp 98.5 F (36.9 C) (Oral)   Ht 6' (1.829 m)   Wt 289 lb 12.8 oz (131.5 kg)   SpO2 94%   BMI 39.30 kg/m                 Constitutional: Pt appears in NAD               HENT: Head: NCAT.                Right Ear: External ear normal.                 Left Ear: External ear normal.                Eyes: . Pupils are equal, round, and reactive to light. Conjunctivae and EOM are normal               Nose: without d/c or deformity               Neck: Neck supple. Gross normal ROM  Cardiovascular: Normal rate and regular rhythm.                  Pulmonary/Chest: Effort normal and breath sounds without rales or wheezing.                Abd:  Soft, NT, ND, + BS, no organomegaly               Neurological: Pt is alert. At baseline orientation, motor grossly intact               Skin: Skin is warm. No rashes, no other new lesions, LE edema - none               Psychiatric: Pt behavior is normal without agitation   Micro: none  Cardiac tracings I have personally interpreted today:  none  Pertinent Radiological findings (summarize): none   Lab Results  Component Value Date   WBC 8.5 09/13/2023   HGB 15.7 09/13/2023   HCT 49.1 09/13/2023   PLT 271 09/13/2023   GLUCOSE 231 (H) 09/13/2023   CHOL 100 07/24/2023   TRIG 127.0 07/24/2023   HDL 35.40 (L) 07/24/2023   LDLDIRECT 72.0 07/17/2022   LDLCALC 40 07/24/2023   ALT 32 09/13/2023   AST 26 09/13/2023   NA 142 09/13/2023   K 4.0 09/13/2023   CL 103 09/13/2023   CREATININE 0.94 09/13/2023   BUN 19 09/13/2023   CO2 26 09/13/2023   TSH 5.00 01/23/2023   PSA 0.06 (L) 01/23/2023   INR 1.0 09/13/2023   HGBA1C 8.5 (H) 07/24/2023   MICROALBUR 0.7 01/23/2023   Assessment/Plan:  Leon Nelson is a 67 y.o. White or Caucasian [1] male with  has a past medical history of ABDOMINAL PAIN OTHER SPECIFIED SITE (07/20/2008), CHEST PAIN (02/06/2011), COLONIC POLYPS, HX OF (05/26/2008), DEPRESSION (05/26/2008), DIABETES MELLITUS, TYPE II (05/26/2008), HYPERLIPIDEMIA (05/26/2008), HYPERTENSION (05/26/2008), HYPOTHYROIDISM (05/26/2008), KNEE PAIN, RIGHT, ACUTE (07/01/2008), Personal history of urinary calculi (05/26/2008), and UTI'S, HX OF (05/26/2008).  Allergic rhinitis Mild to mod, for zyrtec and anasacort asd,  to f/u any worsening symptoms or concerns  Constipation Mild to mod, for miralax and colace bid prn, to f/u any worsening symptoms or concerns  Hematochezia Mild small volume, painless, for cbcm refer GI,  to f/u any worsening symptoms or concerns  Vitamin D deficiency Last vitamin D Lab  Results  Component Value Date   VD25OH 68.61 07/24/2023   Stable, cont oral replacement   Changes in vision Etiology unclear, for optho referral asap r/o retinal disorder, also fro MRI brain r/o tia  Followup: Return in about 5 months (around 01/29/2024).  Oliver Barre, MD 09/14/2023 9:09 PM [facility]

## 2023-09-13 NOTE — Patient Instructions (Signed)
Ok to take the Miralax OTC 17 gm by mouth up to twice per day for constipation as needed  Ok to take the Colace OTC 100 mg up to twice per day for constipation as needed  Please take all new medication as prescribed - the zyrtec and nasacort for the allergies and congestion  Please continue all other medications as before, and refills have been done if requested.  Please have the pharmacy call with any other refills you may need.  Please keep your appointments with your specialists as you may have planned  You will be contacted regarding the referral for: MRI brain, Eye Doctor - Dr Luciana Axe, and GI - Dr Lorenso Quarry asap  Please go to the LAB at the blood drawing area for the tests to be done  You will be contacted by phone if any changes need to be made immediately.  Otherwise, you will receive a letter about your results with an explanation, but please check with MyChart first.

## 2023-09-14 ENCOUNTER — Encounter: Payer: Self-pay | Admitting: Internal Medicine

## 2023-09-14 DIAGNOSIS — H539 Unspecified visual disturbance: Secondary | ICD-10-CM | POA: Insufficient documentation

## 2023-09-14 LAB — PROTIME-INR
INR: 1
Prothrombin Time: 10.7 s (ref 9.0–11.5)

## 2023-09-14 LAB — CBC WITH DIFFERENTIAL/PLATELET
Absolute Monocytes: 578 cells/uL (ref 200–950)
Basophils Absolute: 51 cells/uL (ref 0–200)
Basophils Relative: 0.6 %
Eosinophils Absolute: 289 cells/uL (ref 15–500)
Eosinophils Relative: 3.4 %
HCT: 49.1 % (ref 38.5–50.0)
Hemoglobin: 15.7 g/dL (ref 13.2–17.1)
Lymphs Abs: 2482 cells/uL (ref 850–3900)
MCH: 28.1 pg (ref 27.0–33.0)
MCHC: 32 g/dL (ref 32.0–36.0)
MCV: 87.8 fL (ref 80.0–100.0)
MPV: 11.1 fL (ref 7.5–12.5)
Monocytes Relative: 6.8 %
Neutro Abs: 5100 cells/uL (ref 1500–7800)
Neutrophils Relative %: 60 %
Platelets: 271 10*3/uL (ref 140–400)
RBC: 5.59 10*6/uL (ref 4.20–5.80)
RDW: 13.1 % (ref 11.0–15.0)
Total Lymphocyte: 29.2 %
WBC: 8.5 10*3/uL (ref 3.8–10.8)

## 2023-09-14 LAB — BASIC METABOLIC PANEL
BUN: 19 mg/dL (ref 7–25)
CO2: 26 mmol/L (ref 20–32)
Calcium: 9.8 mg/dL (ref 8.6–10.3)
Chloride: 103 mmol/L (ref 98–110)
Creat: 0.94 mg/dL (ref 0.70–1.35)
Glucose, Bld: 231 mg/dL — ABNORMAL HIGH (ref 65–99)
Potassium: 4 mmol/L (ref 3.5–5.3)
Sodium: 142 mmol/L (ref 135–146)

## 2023-09-14 LAB — HEPATIC FUNCTION PANEL
AG Ratio: 1.5 (calc) (ref 1.0–2.5)
ALT: 32 U/L (ref 9–46)
AST: 26 U/L (ref 10–35)
Albumin: 4.3 g/dL (ref 3.6–5.1)
Alkaline phosphatase (APISO): 49 U/L (ref 35–144)
Bilirubin, Direct: 0.1 mg/dL (ref 0.0–0.2)
Globulin: 2.8 g/dL (calc) (ref 1.9–3.7)
Indirect Bilirubin: 0.2 mg/dL (calc) (ref 0.2–1.2)
Total Bilirubin: 0.3 mg/dL (ref 0.2–1.2)
Total Protein: 7.1 g/dL (ref 6.1–8.1)

## 2023-09-14 NOTE — Assessment & Plan Note (Signed)
Etiology unclear, for optho referral asap r/o retinal disorder, also fro MRI brain r/o tia

## 2023-09-14 NOTE — Assessment & Plan Note (Signed)
Last vitamin D Lab Results  Component Value Date   VD25OH 68.61 07/24/2023   Stable, cont oral replacement

## 2023-09-14 NOTE — Progress Notes (Signed)
The test results show that your current treatment is OK, as the tests are stable.  Please continue the same plan.  There is no other need for change of treatment or further evaluation based on these results, at this time.  thanks

## 2023-09-14 NOTE — Assessment & Plan Note (Signed)
Mild to mod, for miralax and colace bid prn, to f/u any worsening symptoms or concerns

## 2023-09-14 NOTE — Assessment & Plan Note (Signed)
Mild small volume, painless, for cbcm refer GI,  to f/u any worsening symptoms or concerns

## 2023-09-14 NOTE — Assessment & Plan Note (Signed)
Mild to mod, for zyrtec and anasacort asd,  to f/u any worsening symptoms or concerns

## 2023-09-17 ENCOUNTER — Ambulatory Visit
Admission: RE | Admit: 2023-09-17 | Discharge: 2023-09-17 | Disposition: A | Discharge status: Home / Self Care | Payer: Medicare Other | Source: Ambulatory Visit | Attending: Internal Medicine | Admitting: Internal Medicine

## 2023-09-17 DIAGNOSIS — H539 Unspecified visual disturbance: Secondary | ICD-10-CM | POA: Diagnosis not present

## 2023-09-17 DIAGNOSIS — R9082 White matter disease, unspecified: Secondary | ICD-10-CM | POA: Diagnosis not present

## 2023-09-24 ENCOUNTER — Telehealth: Payer: Self-pay

## 2023-09-24 DIAGNOSIS — G43109 Migraine with aura, not intractable, without status migrainosus: Secondary | ICD-10-CM | POA: Diagnosis not present

## 2023-09-24 DIAGNOSIS — E119 Type 2 diabetes mellitus without complications: Secondary | ICD-10-CM | POA: Diagnosis not present

## 2023-09-24 DIAGNOSIS — H43393 Other vitreous opacities, bilateral: Secondary | ICD-10-CM | POA: Diagnosis not present

## 2023-09-24 LAB — HM DIABETES EYE EXAM

## 2023-09-24 NOTE — Telephone Encounter (Signed)
Called and let Pt know Ozempic has arrived for pick up, It has been placed in the fridge in the nurse's station.

## 2023-10-17 DIAGNOSIS — K5909 Other constipation: Secondary | ICD-10-CM | POA: Diagnosis not present

## 2023-10-19 ENCOUNTER — Other Ambulatory Visit: Payer: Self-pay | Admitting: Internal Medicine

## 2023-11-05 ENCOUNTER — Other Ambulatory Visit: Payer: Self-pay | Admitting: Internal Medicine

## 2023-11-08 ENCOUNTER — Ambulatory Visit: Payer: Medicare Other | Admitting: Podiatry

## 2023-11-08 DIAGNOSIS — M79675 Pain in left toe(s): Secondary | ICD-10-CM | POA: Diagnosis not present

## 2023-11-08 DIAGNOSIS — M79674 Pain in right toe(s): Secondary | ICD-10-CM | POA: Diagnosis not present

## 2023-11-08 DIAGNOSIS — M79672 Pain in left foot: Secondary | ICD-10-CM | POA: Diagnosis not present

## 2023-11-08 DIAGNOSIS — B351 Tinea unguium: Secondary | ICD-10-CM | POA: Diagnosis not present

## 2023-11-08 DIAGNOSIS — E1149 Type 2 diabetes mellitus with other diabetic neurological complication: Secondary | ICD-10-CM | POA: Diagnosis not present

## 2023-11-08 DIAGNOSIS — Q828 Other specified congenital malformations of skin: Secondary | ICD-10-CM | POA: Diagnosis not present

## 2023-11-08 DIAGNOSIS — M79671 Pain in right foot: Secondary | ICD-10-CM | POA: Diagnosis not present

## 2023-11-08 NOTE — Patient Instructions (Signed)
Gabapentin Capsules or Tablets What is this medication? GABAPENTIN (GA ba pen tin) treats nerve pain. It may also be used to prevent and control seizures in people with epilepsy. It works by calming overactive nerves in your body. This medicine may be used for other purposes; ask your health care provider or pharmacist if you have questions. COMMON BRAND NAME(S): Active-PAC with Gabapentin, Ascencion Dike, Gralise, Neurontin What should I tell my care team before I take this medication? They need to know if you have any of these conditions: Kidney disease Lung or breathing disease Substance use disorder Suicidal thoughts, plans, or attempt by you or a family member An unusual or allergic reaction to gabapentin, other medications, foods, dyes, or preservatives Pregnant or trying to get pregnant Breastfeeding How should I use this medication? Take this medication by mouth with a glass of water. Follow the directions on the prescription label. You can take it with or without food. If it upsets your stomach, take it with food. Take your medication at regular intervals. Do not take it more often than directed. Do not stop taking except on your care team's advice. If you are directed to break the 600 or 800 mg tablets in half as part of your dose, the extra half tablet should be used for the next dose. If you have not used the extra half tablet within 28 days, it should be thrown away. A special MedGuide will be given to you by the pharmacist with each prescription and refill. Be sure to read this information carefully each time. Talk to your care team about the use of this medication in children. While this medication may be prescribed for children as young as 3 years for selected conditions, precautions do apply. Overdosage: If you think you have taken too much of this medicine contact a poison control center or emergency room at once. NOTE: This medicine is only for you. Do not share this medicine with  others. What if I miss a dose? If you miss a dose, take it as soon as you can. If it is almost time for your next dose, take only that dose. Do not take double or extra doses. What may interact with this medication? Alcohol Antihistamines for allergy, cough, and cold Certain medications for anxiety or sleep Certain medications for depression like amitriptyline, fluoxetine, sertraline Certain medications for seizures like phenobarbital, primidone Certain medications for stomach problems General anesthetics like halothane, isoflurane, methoxyflurane, propofol Local anesthetics like lidocaine, pramoxine, tetracaine Medications that relax muscles for surgery Opioid medications for pain Phenothiazines like chlorpromazine, mesoridazine, prochlorperazine, thioridazine This list may not describe all possible interactions. Give your health care provider a list of all the medicines, herbs, non-prescription drugs, or dietary supplements you use. Also tell them if you smoke, drink alcohol, or use illegal drugs. Some items may interact with your medicine. What should I watch for while using this medication? Visit your care team for regular checks on your progress. You may want to keep a record at home of how you feel your condition is responding to treatment. You may want to share this information with your care team at each visit. You should contact your care team if your seizures get worse or if you have any new types of seizures. Do not stop taking this medication or any of your seizure medications unless instructed by your care team. Stopping your medication suddenly can increase your seizures or their severity. This medication may cause serious skin reactions. They can happen weeks to  months after starting the medication. Contact your care team right away if you notice fevers or flu-like symptoms with a rash. The rash may be red or purple and then turn into blisters or peeling of the skin. Or, you might  notice a red rash with swelling of the face, lips or lymph nodes in your neck or under your arms. Wear a medical identification bracelet or chain if you are taking this medication for seizures. Carry a card that lists all your medications. This medication may affect your coordination, reaction time, or judgment. Do not drive or operate machinery until you know how this medication affects you. Sit up or stand slowly to reduce the risk of dizzy or fainting spells. Drinking alcohol with this medication can increase the risk of these side effects. Your mouth may get dry. Chewing sugarless gum or sucking hard candy, and drinking plenty of water may help. Watch for new or worsening thoughts of suicide or depression. This includes sudden changes in mood, behaviors, or thoughts. These changes can happen at any time but are more common in the beginning of treatment or after a change in dose. Call your care team right away if you experience these thoughts or worsening depression. If you become pregnant while using this medication, you may enroll in the [place] American Antiepileptic Drug Pregnancy Registry by calling [phone]. This registry collects information about the safety of antiepileptic medication use during pregnancy. What side effects may I notice from receiving this medication? Side effects that you should report to your care team as soon as possible: Allergic reactions or angioedema--skin rash, itching, hives, swelling of the face, eyes, lips, tongue, arms, or legs, trouble swallowing or breathing Rash, fever, and swollen lymph nodes Thoughts of suicide or self harm, worsening mood, feelings of depression Trouble breathing Unusual changes in mood or behavior in children after use such as difficulty concentrating, hostility, or restlessness Side effects that usually do not require medical attention (report to your care team if they continue or are  bothersome): Dizziness Drowsiness Nausea Swelling of ankles, feet, or hands Vomiting This list may not describe all possible side effects. Call your doctor for medical advice about side effects. You may report side effects to FDA at 1-800-FDA-1088. Where should I keep my medication? Keep out of reach of children and pets. Store at room temperature between 15 and 30 degrees C (59 and 86 degrees F). Get rid of any unused medication after the expiration date. This medication may cause accidental overdose and death if taken by other adults, children, or pets. To get rid of medications that are no longer needed or have expired: Take the medication to a medication take-back program. Check with your pharmacy or law enforcement to find a location. If you cannot return the medication, check the label or package insert to see if the medication should be thrown out in the garbage or flushed down the toilet. If you are not sure, ask your care team. If it is safe to put it in the trash, empty the medication out of the container. Mix the medication with cat litter, dirt, coffee grounds, or other unwanted substance. Seal the mixture in a bag or container. Put it in the trash. NOTE: This sheet is a summary. It may not cover all possible information. If you have questions about this medicine, talk to your doctor, pharmacist, or health care provider.  2024 Elsevier/Gold Standard (2022-09-25 00:00:00)

## 2023-11-08 NOTE — Progress Notes (Signed)
Subjective: 67 year old male presents the office today for diabetic foot evaluation and for calluses to both of his great toes as well as the toenails he cannot trim himself.  He states the neuropathy has been getting worse.  He takes 2 gabapentin at nighttime, 60 mg total.  He does not have any side effects of this.  No open lesions and no new concerns.   Last A1c was 8.5 on 07/24/2023 Corwin Levins, MD Last seen 07/24/2023  Objective: AAO x3, NAD DP/PT pulses palpable bilaterally, CRT less than 3 seconds Sensation decreased with SWMF.  Nails are hypertrophic, dystrophic, brittle, discolored, elongated 10. No surrounding redness or drainage. Tenderness nails 1-5 bilaterally.   Hyperkeratotic lesions to bilateral hallux and left submetatarsal 2.  Upon debridement no underlying ulceration, drainage or any signs of infection.  Flatfoot present bilateral.  No other open lesions or pre-ulcerative lesions are identified today.No pain with calf compression, swelling, warmth, erythema  Assessment: 67 year old male with symptomatic onychomycosis, hyperkeratotic lesions; neuropathy  Plan: -All treatment options discussed with the patient including all alternatives, risks, complications.  -Nails debrided x10 without any complications or bleeding -Hyperkeratotic lesion sharply treated x 3 without complications -Increase gabapentin to once during the day and 2 at night (takint 2 at night right now). Monitor for any side affects. We discussed other medication options as well as Qutenza and over the counter medications.  -Discussed daily foot inspection, glucose control  Return in about 3 months (around 02/08/2024).  Vivi Barrack DPM

## 2023-11-25 ENCOUNTER — Other Ambulatory Visit: Payer: Self-pay | Admitting: Internal Medicine

## 2023-11-26 ENCOUNTER — Other Ambulatory Visit: Payer: Self-pay

## 2023-12-05 ENCOUNTER — Other Ambulatory Visit: Payer: Self-pay | Admitting: Internal Medicine

## 2023-12-17 ENCOUNTER — Telehealth: Payer: Self-pay

## 2023-12-18 ENCOUNTER — Other Ambulatory Visit: Payer: Self-pay | Admitting: Internal Medicine

## 2024-01-01 ENCOUNTER — Other Ambulatory Visit: Payer: Self-pay | Admitting: Internal Medicine

## 2024-01-02 ENCOUNTER — Other Ambulatory Visit: Payer: Self-pay

## 2024-01-15 ENCOUNTER — Encounter: Payer: Self-pay | Admitting: Internal Medicine

## 2024-01-16 ENCOUNTER — Other Ambulatory Visit: Payer: Self-pay | Admitting: Internal Medicine

## 2024-01-16 DIAGNOSIS — I10 Essential (primary) hypertension: Secondary | ICD-10-CM

## 2024-01-16 MED ORDER — DAPAGLIFLOZIN PROPANEDIOL 10 MG PO TABS: 10.0000 mg | ORAL_TABLET | Freq: Every day | ORAL | 3 refills | Status: DC

## 2024-01-16 NOTE — Telephone Encounter (Signed)
Ok done [employer]

## 2024-01-28 NOTE — Telephone Encounter (Signed)
PAP: Patient assistance application for Ozempic through [employer] has been mailed to pt's home address on file.    Provider portion of application HAS BEEN faxed to provider's office OF Oliver Barre MD FOR PROCESSING AND REVIEW.

## 2024-01-29 ENCOUNTER — Encounter: Payer: Self-pay | Admitting: Internal Medicine

## 2024-01-29 ENCOUNTER — Ambulatory Visit: Payer: Medicare Other | Admitting: Internal Medicine

## 2024-01-29 ENCOUNTER — Other Ambulatory Visit: Payer: Self-pay | Admitting: Internal Medicine

## 2024-01-29 VITALS — BP 128/70 | HR 65 | Temp 99.1°F | Ht 72.0 in | Wt 285.0 lb

## 2024-01-29 DIAGNOSIS — E559 Vitamin D deficiency, unspecified: Secondary | ICD-10-CM

## 2024-01-29 DIAGNOSIS — G629 Polyneuropathy, unspecified: Secondary | ICD-10-CM | POA: Diagnosis not present

## 2024-01-29 DIAGNOSIS — E039 Hypothyroidism, unspecified: Secondary | ICD-10-CM

## 2024-01-29 DIAGNOSIS — Z7985 Long-term (current) use of injectable non-insulin antidiabetic drugs: Secondary | ICD-10-CM | POA: Diagnosis not present

## 2024-01-29 DIAGNOSIS — E1149 Type 2 diabetes mellitus with other diabetic neurological complication: Secondary | ICD-10-CM | POA: Diagnosis not present

## 2024-01-29 DIAGNOSIS — E538 Deficiency of other specified B group vitamins: Secondary | ICD-10-CM

## 2024-01-29 DIAGNOSIS — Z0001 Encounter for general adult medical examination with abnormal findings: Secondary | ICD-10-CM

## 2024-01-29 DIAGNOSIS — Z7984 Long term (current) use of oral hypoglycemic drugs: Secondary | ICD-10-CM | POA: Diagnosis not present

## 2024-01-29 DIAGNOSIS — Z125 Encounter for screening for malignant neoplasm of prostate: Secondary | ICD-10-CM

## 2024-01-29 DIAGNOSIS — E782 Mixed hyperlipidemia: Secondary | ICD-10-CM

## 2024-01-29 DIAGNOSIS — I10 Essential (primary) hypertension: Secondary | ICD-10-CM | POA: Diagnosis not present

## 2024-01-29 LAB — MICROALBUMIN / CREATININE URINE RATIO
Creatinine,U: 42.3 mg/dL
Microalb Creat Ratio: 1.7 mg/g (ref 0.0–30.0)
Microalb, Ur: <0.7 mg/dL (ref 0.0–1.9)

## 2024-01-29 LAB — BASIC METABOLIC PANEL
BUN: 17 mg/dL (ref 6–23)
CO2: 28 meq/L (ref 19–32)
Calcium: 9.4 mg/dL (ref 8.4–10.5)
Chloride: 103 meq/L (ref 96–112)
Creatinine, Ser: 0.81 mg/dL (ref 0.40–1.50)
GFR: 90.2 mL/min (ref >60.00)
Glucose, Bld: 205 mg/dL — ABNORMAL HIGH (ref 70–99)
Potassium: 4.2 meq/L (ref 3.5–5.1)
Sodium: 141 meq/L (ref 135–145)

## 2024-01-29 LAB — URINALYSIS, ROUTINE W REFLEX MICROSCOPIC
Bilirubin Urine: NEGATIVE
Hgb urine dipstick: NEGATIVE
Ketones, ur: NEGATIVE
Leukocytes,Ua: NEGATIVE
Nitrite: NEGATIVE
Specific Gravity, Urine: 1.01 (ref 1.000–1.030)
Total Protein, Urine: NEGATIVE
Urine Glucose: >=1000 — AB
Urobilinogen, UA: 0.2 (ref 0.0–1.0)
pH: 6 (ref 5.0–8.0)

## 2024-01-29 LAB — LIPID PANEL
Cholesterol: 108 mg/dL (ref 0–200)
HDL: 37.7 mg/dL — ABNORMAL LOW (ref >39.00)
LDL Cholesterol: 45 mg/dL (ref 0–99)
NonHDL: 70.12
Total CHOL/HDL Ratio: 3
Triglycerides: 127 mg/dL (ref 0.0–149.0)
VLDL: 25.4 mg/dL (ref 0.0–40.0)

## 2024-01-29 LAB — HEPATIC FUNCTION PANEL
ALT: 30 U/L (ref 0–53)
AST: 19 U/L (ref 0–37)
Albumin: 4.3 g/dL (ref 3.5–5.2)
Alkaline Phosphatase: 44 U/L (ref 39–117)
Bilirubin, Direct: 0.2 mg/dL (ref 0.0–0.3)
Total Bilirubin: 0.4 mg/dL (ref 0.2–1.2)
Total Protein: 6.8 g/dL (ref 6.0–8.3)

## 2024-01-29 LAB — CBC WITH DIFFERENTIAL/PLATELET
Basophils Absolute: 0.1 10*3/uL (ref 0.0–0.1)
Basophils Relative: 0.7 % (ref 0.0–3.0)
Eosinophils Absolute: 0.4 10*3/uL (ref 0.0–0.7)
Eosinophils Relative: 4.4 % (ref 0.0–5.0)
HCT: 47.1 % (ref 39.0–52.0)
Hemoglobin: 16 g/dL (ref 13.0–17.0)
Lymphocytes Relative: 35.6 % (ref 12.0–46.0)
Lymphs Abs: 3 10*3/uL (ref 0.7–4.0)
MCHC: 34.1 g/dL (ref 30.0–36.0)
MCV: 86.9 fL (ref 78.0–100.0)
Monocytes Absolute: 0.7 10*3/uL (ref 0.1–1.0)
Monocytes Relative: 8.1 % (ref 3.0–12.0)
Neutro Abs: 4.3 10*3/uL (ref 1.4–7.7)
Neutrophils Relative %: 51.2 % (ref 43.0–77.0)
Platelets: 233 10*3/uL (ref 150.0–400.0)
RBC: 5.42 Mil/uL (ref 4.22–5.81)
RDW: 13.8 % (ref 11.5–15.5)
WBC: 8.4 10*3/uL (ref 4.0–10.5)

## 2024-01-29 LAB — TSH: TSH: 3.52 u[IU]/mL (ref 0.35–5.50)

## 2024-01-29 LAB — VITAMIN B12: Vitamin B-12: 645 pg/mL (ref 211–911)

## 2024-01-29 LAB — PSA: PSA: 0.1 ng/mL (ref 0.10–4.00)

## 2024-01-29 LAB — VITAMIN D 25 HYDROXY (VIT D DEFICIENCY, FRACTURES): VITD: 67.31 ng/mL (ref 30.00–100.00)

## 2024-01-29 LAB — HEMOGLOBIN A1C: Hgb A1c MFr Bld: 8.5 % — ABNORMAL HIGH (ref 4.6–6.5)

## 2024-01-29 MED ORDER — SEMAGLUTIDE(0.25 OR 0.5MG/DOS) 2 MG/3ML ~~LOC~~ SOPN: PEN_INJECTOR | SUBCUTANEOUS | Status: DC

## 2024-01-29 NOTE — Patient Instructions (Signed)
 Ok to increase the ozempic  to 0.5 mg weekly, and the form is filled out to be faxed  Please continue all other medications as before, and refills have been done if requested.  Please have the pharmacy call with any other refills you may need.  Please continue your efforts at being more active, low cholesterol diet, and weight control.  You are otherwise up to date with prevention measures today.  Please keep your appointments with your specialists as you may have planned  You will be contacted regarding the referral for: Cardiac CT score  Please go to the LAB at the blood drawing area for the tests to be done  You will be contacted by phone if any changes need to be made immediately.  Otherwise, you will receive a letter about your results with an explanation, but please check with MyChart first.  Please make an Appointment to return in 6 months, or sooner if needed

## 2024-01-29 NOTE — Progress Notes (Addendum)
 Patient ID: Leon Nelson, male   DOB: 11-23-1956, 68 y.o.   MRN: [id number]         Chief Complaint:: wellness exam and low b12. Dm, htn, hld, low thyroid , low vit d       HPI:  Leon Nelson is a 68 y.o. male here for wellness exam; up to date                        Also was confused about metformin  - only taking 2 per day (1000 mg) instead of bid. Pt denies chest pain, increased sob or doe, wheezing, orthopnea, PND, increased LE swelling, palpitations, dizziness or syncope.   Pt denies polydipsia, polyuria, or new focal neuro s/s.    Pt denies fever, wt loss, night sweats, loss of appetite, or other constitutional symptoms  Also states balance somewhat worse recently for unclear reason except has ongoing neuropathy and maybe worse recently.  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     Wt Readings from Last 3 Encounters:  01/29/24 285 lb (129.3 kg)  09/13/23 289 lb 12.8 oz (131.5 kg)  07/24/23 290 lb (131.5 kg)   BP Readings from Last 3 Encounters:  01/29/24 128/70  09/13/23 104/66  07/24/23 100/80   Immunization History  Administered Date(s) Administered   Fluad Quad(high Dose 65+) 12/04/2020, 01/17/2022, 01/23/2023   Fluad Trivalent(High Dose 65+) 09/13/2023   H1N1 01/24/2009   Influenza Split 09/21/2011   Influenza Whole 09/23/2008, 09/23/2009   Influenza, Seasonal, Injecte, Preservative Fre 02/11/2013   Influenza,inj,Quad PF,6+ Mos 01/20/2014, 09/16/2015, 11/06/2019   Influenza-Unspecified 09/22/2016, 10/20/2018   PFIZER(Purple Top)SARS-COV-2 Vaccination 01/25/2020, 02/15/2020, 12/04/2020   Pneumococcal Conjugate-13 11/06/2019   Pneumococcal Polysaccharide-23 12/24/2005, 07/21/2014, 04/24/2021   Td 01/24/2009   Tdap 11/06/2019   There are no preventive care reminders to display for this patient.     Past Medical History:  Diagnosis Date   ABDOMINAL PAIN OTHER  SPECIFIED SITE 07/20/2008   CHEST PAIN 02/06/2011   COLONIC POLYPS, HX OF 05/26/2008   DEPRESSION 05/26/2008   DIABETES MELLITUS, TYPE II 05/26/2008   HYPERLIPIDEMIA 05/26/2008   HYPERTENSION 05/26/2008   HYPOTHYROIDISM 05/26/2008   KNEE PAIN, RIGHT, ACUTE 07/01/2008   Personal history of urinary calculi 05/26/2008   UTI'S, HX OF 05/26/2008   Past Surgical History:  Procedure Laterality Date   s/p right knee arthroplasty      complicated by patellar tendon rupture Jan 2011 Dr. heide   TONSILLECTOMY      reports that he has never smoked. He has never used smokeless tobacco. He reports that he does not drink alcohol and does not use drugs. family history includes Alcohol abuse in an other family member; Cancer in his mother; Diabetes in an other family member; Heart disease in his father and mother; Hyperlipidemia in his father and mother. Allergies  Allergen Reactions   Penicillins Other (See Comments)    Blisters on hands   Sulfonamide Derivatives Other (See Comments)    Blisters in groin area   Current Outpatient Medications on File Prior to Visit  Medication Sig Dispense Refill   atenolol -chlorthalidone  (TENORETIC ) 50-25 MG tablet TAKE ONE-HALF TABLET BY MOUTH  DAILY 50 tablet 2   Blood Glucose Monitoring Suppl (ONE TOUCH ULTRA 2) w/Device KIT Use as directed once daily E11.9 1 kit 0   dapagliflozin  propanediol (FARXIGA )  10 MG TABS tablet Take 1 tablet (10 mg total) by mouth daily before breakfast. 90 tablet 3   diclofenac  sodium (VOLTAREN ) 1 % GEL Apply 2 g topically 4 (four) times daily as needed. 200 g 5   gabapentin  (NEURONTIN ) 300 MG capsule TAKE 1 CAPSULE BY MOUTH TWICE  DAILY 200 capsule 2   glipiZIDE  (GLUCOTROL  XL) 10 MG 24 hr tablet TAKE 1 TABLET BY MOUTH DAILY  WITH BREAKFAST 100 tablet 2   glucose blood (ONE TOUCH ULTRA TEST) test strip Use as instructed once daily E11.9 100 each 3   ketoconazole (NIZORAL) 2 % shampoo Apply 1 Application topically 3 (three) times a week.     Lancets  MISC Use as directed once daily E11.9 100 each 3   levothyroxine  (SYNTHROID ) 50 MCG tablet TAKE 1 TABLET BY MOUTH DAILY 60 tablet 5   lisinopril  (ZESTRIL ) 20 MG tablet TAKE 1 TABLET BY MOUTH DAILY 100 tablet 2   OneTouch Delica Lancets 30G MISC Use as directed once daily 100 each 1   potassium chloride  SA (KLOR-CON  M) 20 MEQ tablet TAKE 2 TABLETS BY MOUTH DAILY 200 tablet 2   rosuvastatin  (CRESTOR ) 40 MG tablet TAKE 1 TABLET BY MOUTH DAILY 90 tablet 3   solifenacin  (VESICARE ) 5 MG tablet Take 1 tablet (5 mg total) by mouth daily. 90 tablet 3   venlafaxine  XR (EFFEXOR -XR) 75 MG 24 hr capsule TAKE 1 CAPSULE BY MOUTH DAILY 100 capsule 2   No current facility-administered medications on file prior to visit.        ROS:  All others reviewed and negative.  Objective        PE:  BP 128/70 (BP Location: Right Arm, Patient Position: Sitting, Cuff Size: Normal)   Pulse 65   Temp 99.1 F (37.3 C) (Oral)   Ht 6' (1.829 m)   Wt 285 lb (129.3 kg)   SpO2 99%   BMI 38.65 kg/m                 Constitutional: Pt appears in NAD               HENT: Head: NCAT.                Right Ear: External ear normal.                 Left Ear: External ear normal.                Eyes: . Pupils are equal, round, and reactive to light. Conjunctivae and EOM are normal               Nose: without d/c or deformity               Neck: Neck supple. Gross normal ROM               Cardiovascular: Normal rate and regular rhythm.                 Pulmonary/Chest: Effort normal and breath sounds without rales or wheezing.                Abd:  Soft, NT, ND, + BS, no organomegaly               Neurological: Pt is alert. At baseline orientation, motor grossly intact               Skin: Skin is warm. No rashes, no other new lesions, LE edema -  none               Psychiatric: Pt behavior is normal without agitation   Micro: none  Cardiac tracings I have personally interpreted today:  none  Pertinent Radiological findings  (summarize): none   Lab Results  Component Value Date   WBC 8.4 01/29/2024   HGB 16.0 01/29/2024   HCT 47.1 01/29/2024   PLT 233.0 01/29/2024   GLUCOSE 205 (H) 01/29/2024   CHOL 108 01/29/2024   TRIG 127.0 01/29/2024   HDL 37.70 (L) 01/29/2024   LDLDIRECT 72.0 07/17/2022   LDLCALC 45 01/29/2024   ALT 30 01/29/2024   AST 19 01/29/2024   NA 141 01/29/2024   K 4.2 01/29/2024   CL 103 01/29/2024   CREATININE 0.81 01/29/2024   BUN 17 01/29/2024   CO2 28 01/29/2024   TSH 3.52 01/29/2024   PSA 0.10 01/29/2024   INR 1.0 09/13/2023   HGBA1C 8.5 (H) 01/29/2024   MICROALBUR <0.7 01/29/2024   Assessment/Plan:  Leon Nelson is a 68 y.o. White or Caucasian [1] male with  has a past medical history of ABDOMINAL PAIN OTHER SPECIFIED SITE (07/20/2008), CHEST PAIN (02/06/2011), COLONIC POLYPS, HX OF (05/26/2008), DEPRESSION (05/26/2008), DIABETES MELLITUS, TYPE II (05/26/2008), HYPERLIPIDEMIA (05/26/2008), HYPERTENSION (05/26/2008), HYPOTHYROIDISM (05/26/2008), KNEE PAIN, RIGHT, ACUTE (07/01/2008), Personal history of urinary calculi (05/26/2008), and UTI'S, HX OF (05/26/2008).  Encounter for well adult exam with abnormal findings Age and sex appropriate education and counseling updated with regular exercise and diet Referrals for preventative services - none needed Immunizations addressed - none needed Smoking counseling  - none needed Evidence for depression or other mood disorder - none significant Most recent labs reviewed. I have personally reviewed and have noted: 1) the patient's medical and social history 2) The patient's current medications and supplements 3) The patient's height, weight, and BMI have been recorded in the chart   B12 deficiency Lab Results  Component Value Date   VITAMINB12 645 01/29/2024   Stable, cont oral replacement - b12 1000 mcg qd   Diabetes mellitus type 2 with neurological manifestations (HCC) Lab Results  Component Value Date   HGBA1C 8.5 (H) 01/29/2024    Uncontrolled,, pt to continue current medical treatment farxiga  10 every day, glucotrol  xl 10 every day, metformin  ER 500 mg - 2 bid, but increase ozempic  to 0.5 mg weekly; also for Card CT score   Essential hypertension BP Readings from Last 3 Encounters:  01/29/24 128/70  09/13/23 104/66  07/24/23 100/80   Stable, pt to continue medical treatment tenoretic  50 25 every day, lisinopril  20 qd   Hyperlipidemia Lab Results  Component Value Date   LDLCALC 45 01/29/2024   Stable, pt to continue current statin crestor  40 qd   Hypothyroidism Lab Results  Component Value Date   TSH 3.52 01/29/2024   Stable, pt to continue levothyroxine  50 mcg qd  Vitamin D  deficiency Last vitamin D  Lab Results  Component Value Date   VD25OH 67.31 01/29/2024   Stable, cont oral replacement   Neuropathy To restart gabapentin  asd Followup: No follow-ups on file.  Lynwood Rush, MD 02/02/2024 4:58 PM [facility]

## 2024-01-30 ENCOUNTER — Other Ambulatory Visit: Payer: Self-pay

## 2024-01-30 DIAGNOSIS — K5909 Other constipation: Secondary | ICD-10-CM | POA: Diagnosis not present

## 2024-01-30 DIAGNOSIS — K625 Hemorrhage of anus and rectum: Secondary | ICD-10-CM | POA: Diagnosis not present

## 2024-02-02 ENCOUNTER — Encounter: Payer: Self-pay | Admitting: Internal Medicine

## 2024-02-02 DIAGNOSIS — G629 Polyneuropathy, unspecified: Secondary | ICD-10-CM | POA: Insufficient documentation

## 2024-02-02 NOTE — Assessment & Plan Note (Signed)
 BP Readings from Last 3 Encounters:  01/29/24 128/70  09/13/23 104/66  07/24/23 100/80   Stable, pt to continue medical treatment tenoretic  50 25 every day, lisinopril  20 qd

## 2024-02-02 NOTE — Assessment & Plan Note (Signed)
 Lab Results  Component Value Date   VITAMINB12 645 01/29/2024   Stable, cont oral replacement - b12 1000 mcg qd

## 2024-02-02 NOTE — Assessment & Plan Note (Signed)

## 2024-02-02 NOTE — Assessment & Plan Note (Addendum)
 Lab Results  Component Value Date   HGBA1C 8.5 (H) 01/29/2024   Uncontrolled,, pt to continue current medical treatment farxiga  10 every day, glucotrol  xl 10 every day, metformin  ER 500 mg - 2 bid, but increase ozempic  to 0.5 mg weekly; also for Card CT score

## 2024-02-02 NOTE — Assessment & Plan Note (Signed)
 To restart gabapentin  asd

## 2024-02-02 NOTE — Assessment & Plan Note (Signed)
 Lab Results  Component Value Date   TSH 3.52 01/29/2024   Stable, pt to continue levothyroxine  50 mcg qd

## 2024-02-02 NOTE — Assessment & Plan Note (Signed)
 Lab Results  Component Value Date   LDLCALC 45 01/29/2024   Stable, pt to continue current statin crestor  40 qd

## 2024-02-02 NOTE — Assessment & Plan Note (Signed)
 Last vitamin D  Lab Results  Component Value Date   VD25OH 67.31 01/29/2024   Stable, cont oral replacement

## 2024-02-03 NOTE — Telephone Encounter (Signed)
 RECEIVED PROVIDER PAGES WAITING ON PT APPLICATION.   PLEASE BE ADVISED PROVIDER PAGES ARE SCANNED IN TO MEDIA OF CHART

## 2024-02-05 LAB — HM DIABETES EYE EXAM

## 2024-02-14 ENCOUNTER — Ambulatory Visit: Payer: Medicare Other | Admitting: Podiatry

## 2024-02-14 ENCOUNTER — Encounter: Payer: Self-pay | Admitting: Podiatry

## 2024-02-14 DIAGNOSIS — M79674 Pain in right toe(s): Secondary | ICD-10-CM | POA: Diagnosis not present

## 2024-02-14 DIAGNOSIS — E1149 Type 2 diabetes mellitus with other diabetic neurological complication: Secondary | ICD-10-CM

## 2024-02-14 DIAGNOSIS — B351 Tinea unguium: Secondary | ICD-10-CM | POA: Diagnosis not present

## 2024-02-14 DIAGNOSIS — M79675 Pain in left toe(s): Secondary | ICD-10-CM | POA: Diagnosis not present

## 2024-02-14 DIAGNOSIS — Q828 Other specified congenital malformations of skin: Secondary | ICD-10-CM | POA: Diagnosis not present

## 2024-02-14 NOTE — Progress Notes (Signed)
Subjective: Chief Complaint  Patient presents with   Debridement    Trim toenails/calluses - diabetic - 20.39    68 year old male presents the office today for diabetic foot evaluation and for calluses to both of his great toes as well as the toenails he cannot trim himself.  He states his sugar is still high, but last A1c has improved. No new concerns. No open lesions.   Last A1c was 8.5 on 02/18/2024 Corwin Levins, MD Last seen 01/29/2024  Objective: AAO x3, NAD DP/PT pulses palpable bilaterally, CRT less than 3 seconds Sensation decreased with SWMF.  Nails are hypertrophic, dystrophic, brittle, discolored, elongated 10. No surrounding redness or drainage. Tenderness nails 1-5 bilaterally.   Hyperkeratotic lesions to bilateral hallux and left submetatarsal 2.  Upon debridement no underlying ulceration, drainage or any signs of infection.  Flatfoot present bilateral.  No other open lesions or pre-ulcerative lesions are identified today.No pain with calf compression, swelling, warmth, erythema  Assessment: 68 year old male with symptomatic onychomycosis, hyperkeratotic lesions; neuropathy  Plan: -All treatment options discussed with the patient including all alternatives, risks, complications.  -Nails debrided x10 without any complications or bleeding -Hyperkeratotic lesion sharply treated x 3 without complications -Continue gabapentin  -Discussed daily foot inspection, glucose control  Return in about 3 months (around 05/13/2024).  Vivi Barrack DPM

## 2024-02-20 DIAGNOSIS — Z85828 Personal history of other malignant neoplasm of skin: Secondary | ICD-10-CM | POA: Diagnosis not present

## 2024-02-20 DIAGNOSIS — D1801 Hemangioma of skin and subcutaneous tissue: Secondary | ICD-10-CM | POA: Diagnosis not present

## 2024-02-20 DIAGNOSIS — L821 Other seborrheic keratosis: Secondary | ICD-10-CM | POA: Diagnosis not present

## 2024-02-20 DIAGNOSIS — Z129 Encounter for screening for malignant neoplasm, site unspecified: Secondary | ICD-10-CM | POA: Diagnosis not present

## 2024-02-20 DIAGNOSIS — L853 Xerosis cutis: Secondary | ICD-10-CM | POA: Diagnosis not present

## 2024-02-25 ENCOUNTER — Telehealth: Payer: Self-pay

## 2024-02-25 NOTE — Telephone Encounter (Signed)
 Gave pt a call we have not received patient portion of [employer] application for Ozempic,Will have mail out since 11/2023 spoke with pt Leon Nelson gave consent to fill application on line but [employer] is down will submit provider portion only.

## 2024-02-26 ENCOUNTER — Ambulatory Visit (HOSPITAL_COMMUNITY)
Admission: RE | Admit: 2024-02-26 | Discharge: 2024-02-26 | Disposition: A | Discharge status: Home / Self Care | Payer: Self-pay | Source: Ambulatory Visit | Attending: Internal Medicine | Admitting: Internal Medicine

## 2024-02-26 ENCOUNTER — Encounter: Payer: Self-pay | Admitting: Internal Medicine

## 2024-02-26 ENCOUNTER — Other Ambulatory Visit: Payer: Self-pay | Admitting: Internal Medicine

## 2024-02-26 ENCOUNTER — Telehealth: Payer: Self-pay | Admitting: Internal Medicine

## 2024-02-26 DIAGNOSIS — R931 Abnormal findings on diagnostic imaging of heart and coronary circulation: Secondary | ICD-10-CM

## 2024-02-26 DIAGNOSIS — E782 Mixed hyperlipidemia: Secondary | ICD-10-CM | POA: Insufficient documentation

## 2024-02-26 DIAGNOSIS — E1149 Type 2 diabetes mellitus with other diabetic neurological complication: Secondary | ICD-10-CM | POA: Insufficient documentation

## 2024-02-26 DIAGNOSIS — I10 Essential (primary) hypertension: Secondary | ICD-10-CM | POA: Insufficient documentation

## 2024-02-26 MED ORDER — ASPIRIN 81 MG PO TBEC: 81.0000 mg | DELAYED_RELEASE_TABLET | Freq: Every day | ORAL | Status: AC

## 2024-02-26 NOTE — Telephone Encounter (Signed)
 Was able to submit application online [employer] (Ozempic),will follow up on Dr portion in a few days.

## 2024-02-26 NOTE — Telephone Encounter (Signed)
 Placed on provider desk

## 2024-02-26 NOTE — Telephone Encounter (Signed)
 We have received [employer] pt assistance pw and it has been placed in the providers box.  Once complete please fax to: [phone]

## 2024-02-28 NOTE — Telephone Encounter (Signed)
 Received provider portion faxing it today to [employer] will follow up in a few day

## 2024-02-28 NOTE — Telephone Encounter (Signed)
 Forms have been faxed

## 2024-03-03 NOTE — Telephone Encounter (Signed)
 Following up on patient application for [employer] a call pt has been APPROVED for 2025 and will be mailing to Dr office with in 10-14 days, left a HIPAA VM to pt.

## 2024-03-17 ENCOUNTER — Encounter: Payer: Self-pay | Admitting: Internal Medicine

## 2024-03-17 DIAGNOSIS — I7121 Aneurysm of the ascending aorta, without rupture: Secondary | ICD-10-CM | POA: Insufficient documentation

## 2024-03-19 ENCOUNTER — Telehealth: Payer: Self-pay

## 2024-03-19 NOTE — Telephone Encounter (Signed)
 Called and left voicemail letting Pt know his Ozempic has arrrived and is ready for pickup, it has been placed in the nurse's station fridge.

## 2024-03-26 ENCOUNTER — Other Ambulatory Visit: Payer: Self-pay | Admitting: Internal Medicine

## 2024-03-26 ENCOUNTER — Other Ambulatory Visit: Payer: Self-pay

## 2024-04-06 ENCOUNTER — Ambulatory Visit: Payer: Self-pay

## 2024-04-06 NOTE — Telephone Encounter (Signed)
 Copied from CRM [id number]. Topic: Clinical - Red Word Triage >> Apr 06, 2024  4:52 PM Alyse July wrote: Red Word that prompted transfer to Nurse Triage: Shortness of Breath   Chief Complaint: Difficulty breathing  Symptoms: Shortness of breath with exertion  Frequency: Intermittent  Pertinent Negatives: Patient denies Chest pain  Disposition: [] ED /[] Urgent Care (no appt availability in office) / [x] Appointment(In office/virtual)/ []  [facility]/ [] Home Care/ [] Refused Recommended Disposition /[]  Mobile Bus/ []  Follow-up with PCP Additional Notes: Patient states that for the last couple of months he has had intermittent shortness of breath with exertion. He states that when present the shortness of breath "can be pretty bad." He denies any chest pain with his symptoms, and states he does not have any shortness of breath at this time. Appointment made for the patient tomorrow for evaluation of his symptoms.     Reason for Disposition  [1] MILD difficulty breathing (e.g., minimal/no SOB at rest, SOB with walking, pulse <100) AND [2] NEW-onset or WORSE than normal  Answer Assessment - Initial Assessment Questions 1. RESPIRATORY STATUS: "Describe your breathing?" (e.g., wheezing, shortness of breath, unable to speak, severe coughing)      Gets out of breath quickly  2. ONSET: "When did this breathing problem begin?"      A couple of months  3. PATTERN "Does the difficult breathing come and go, or has it been constant since it started?"      Intermittent  4. SEVERITY: "How bad is your breathing?" (e.g., mild, moderate, severe)    - MILD: No SOB at rest, mild SOB with walking, speaks normally in sentences, can lie down, no retractions, pulse < 100.    - MODERATE: SOB at rest, SOB with minimal exertion and prefers to sit, cannot lie down flat, speaks in phrases, mild retractions, audible wheezing, pulse 100-120.    - SEVERE: Very SOB at rest, speaks in single words,  struggling to breathe, sitting hunched forward, retractions, pulse > 120      Moderate  5. RECURRENT SYMPTOM: "Have you had difficulty breathing before?" If Yes, ask: "When was the last time?" and "What happened that time?"      No 6. CARDIAC HISTORY: "Do you have any history of heart disease?" (e.g., heart attack, angina, bypass surgery, angioplasty)      Yes 7. LUNG HISTORY: "Do you have any history of lung disease?"  (e.g., pulmonary embolus, asthma, emphysema)     No 8. CAUSE: "What do you think is causing the breathing problem?"      Unsure  9. OTHER SYMPTOMS: "Do you have any other symptoms? (e.g., dizziness, runny nose, cough, chest pain, fever)     No  Protocols used: Breathing Difficulty-A-AH

## 2024-04-07 ENCOUNTER — Ambulatory Visit (INDEPENDENT_AMBULATORY_CARE_PROVIDER_SITE_OTHER): Admitting: Internal Medicine

## 2024-04-07 ENCOUNTER — Encounter: Payer: Self-pay | Admitting: Internal Medicine

## 2024-04-07 ENCOUNTER — Ambulatory Visit (INDEPENDENT_AMBULATORY_CARE_PROVIDER_SITE_OTHER)

## 2024-04-07 VITALS — BP 122/74 | HR 75 | Temp 98.2°F | Ht 72.0 in | Wt 283.0 lb

## 2024-04-07 DIAGNOSIS — I2510 Atherosclerotic heart disease of native coronary artery without angina pectoris: Secondary | ICD-10-CM

## 2024-04-07 DIAGNOSIS — R0609 Other forms of dyspnea: Secondary | ICD-10-CM

## 2024-04-07 DIAGNOSIS — I2583 Coronary atherosclerosis due to lipid rich plaque: Secondary | ICD-10-CM | POA: Diagnosis not present

## 2024-04-07 DIAGNOSIS — Z981 Arthrodesis status: Secondary | ICD-10-CM | POA: Diagnosis not present

## 2024-04-07 DIAGNOSIS — Z7985 Long-term (current) use of injectable non-insulin antidiabetic drugs: Secondary | ICD-10-CM

## 2024-04-07 DIAGNOSIS — Z7984 Long term (current) use of oral hypoglycemic drugs: Secondary | ICD-10-CM | POA: Diagnosis not present

## 2024-04-07 DIAGNOSIS — E1149 Type 2 diabetes mellitus with other diabetic neurological complication: Secondary | ICD-10-CM | POA: Diagnosis not present

## 2024-04-07 DIAGNOSIS — R0602 Shortness of breath: Secondary | ICD-10-CM | POA: Diagnosis not present

## 2024-04-07 DIAGNOSIS — R9389 Abnormal findings on diagnostic imaging of other specified body structures: Secondary | ICD-10-CM | POA: Diagnosis not present

## 2024-04-07 DIAGNOSIS — Z566 Other physical and mental strain related to work: Secondary | ICD-10-CM

## 2024-04-07 MED ORDER — FREESTYLE LIBRE 3 READER DEVI: 0 refills | Status: AC

## 2024-04-07 MED ORDER — FREESTYLE LIBRE 3 PLUS SENSOR MISC: 3 refills | Status: AC

## 2024-04-07 MED ORDER — ALBUTEROL SULFATE HFA 108 (90 BASE) MCG/ACT IN AERS: 2.0000 | INHALATION_SPRAY | Freq: Four times a day (QID) | RESPIRATORY_TRACT | 11 refills | Status: AC | PRN

## 2024-04-07 NOTE — Patient Instructions (Addendum)
 Ok for work note  Please take all new medication as prescribed  - the inhaler as needed  Please take all new medication as prescribed - the [employer] 3 continuous glucose monitor  Please continue all other medications as before, and refills have been done if requested.  Please have the pharmacy call with any other refills you may need.  Please keep your appointments with your specialists as you may have planned - cardiology may 14  You will be contacted regarding the referral for: Heart stress test, Echocardiogram, and Pulmonary Function testing  Please go to the XRAY Department in the first floor for the x-ray testing  You will be contacted by phone if any changes need to be made immediately.  Otherwise, you will receive a letter about your results with an explanation, but please check with MyChart first.

## 2024-04-07 NOTE — Progress Notes (Unsigned)
 Patient ID: Leon Nelson, male   DOB: 1956-01-19, 68 y.o.   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  Pt denies chest pain, wheezing, orthopnea, PND, increased LE swelling, palpitations, dizziness or syncope, but also with new onset 3 wks sob doe with less effort such as walking down the hallway.  Resolves with rest.  Pt asks for libre 3 meter.  Has hx of CAD by cardiac ct score recently  Has hx of major depression    Denies worsening depressive symptoms, suicidal ideation, or panic        Wt Readings from Last 3 Encounters:  04/07/24 283 lb (128.4 kg)  01/29/24 285 lb (129.3 kg)  09/13/23 289 lb 12.8 oz (131.5 kg)   BP Readings from Last 3 Encounters:  04/07/24 122/74  01/29/24 128/70  09/13/23 104/66         Past Medical History:  Diagnosis Date   ABDOMINAL PAIN OTHER SPECIFIED SITE 07/20/2008   CHEST PAIN 02/06/2011   COLONIC POLYPS, HX OF 05/26/2008   DEPRESSION 05/26/2008   DIABETES MELLITUS, TYPE II 05/26/2008   HYPERLIPIDEMIA 05/26/2008   HYPERTENSION 05/26/2008   HYPOTHYROIDISM 05/26/2008   KNEE PAIN, RIGHT, ACUTE 07/01/2008   Personal history of urinary calculi 05/26/2008   UTI'S, HX OF 05/26/2008   Past Surgical History:  Procedure Laterality Date   s/p right knee arthroplasty      complicated by patellar tendon rupture Jan 2011 Dr. Dante Dyer   TONSILLECTOMY      reports that he has never smoked. He has never used smokeless tobacco. He reports that he does not drink alcohol and does not use drugs. family history includes Alcohol abuse in an other family member; Cancer in his mother; Diabetes in an other family member; Heart disease in his  father and mother; Hyperlipidemia in his father and mother. Allergies  Allergen Reactions   Penicillins Other (See Comments)    Blisters on hands   Sulfonamide Derivatives Other (See Comments)    Blisters in groin area   Current Outpatient Medications on File Prior to Visit  Medication Sig Dispense Refill   allopurinol (ZYLOPRIM) 300 MG tablet TAKE 1 TABLET BY MOUTH DAILY 100 tablet 2   aspirin EC 81 MG tablet Take 1 tablet (81 mg total) by mouth daily. Swallow whole.     atenolol-chlorthalidone (TENORETIC) 50-25 MG tablet TAKE ONE-HALF TABLET BY MOUTH  DAILY 50 tablet 2   Blood Glucose Monitoring Suppl (ONE TOUCH ULTRA 2) w/Device KIT Use as directed once daily E11.9 1 kit 0   dapagliflozin propanediol (FARXIGA) 10 MG TABS tablet Take 1 tablet (10 mg total) by mouth daily before breakfast. 90 tablet 3   diclofenac sodium (VOLTAREN) 1 % GEL Apply 2 g topically 4 (four) times daily as needed. 200 g 5   dicyclomine (BENTYL) 20 MG tablet TAKE 1 TABLET BY MOUTH 3 TIMES  DAILY AS NEEDED 300 tablet 3   gabapentin (NEURONTIN) 300 MG capsule TAKE 1 CAPSULE BY MOUTH TWICE  DAILY 200 capsule 2   glipiZIDE (GLUCOTROL XL)  10 MG 24 hr tablet TAKE 1 TABLET BY MOUTH DAILY  WITH BREAKFAST 100 tablet 2   glucose blood (ONE TOUCH ULTRA TEST) test strip Use as instructed once daily E11.9 100 each 3   ketoconazole (NIZORAL) 2 % shampoo Apply 1 Application topically 3 (three) times a week.     Lancets (ONETOUCH DELICA PLUS LANCET30G) MISC USE AS DIRECTED ONCE DAILY 100 each 2   Lancets MISC Use as directed once daily E11.9 100 each 3   levothyroxine (SYNTHROID) 50 MCG tablet TAKE 1 TABLET BY MOUTH DAILY 60 tablet 5   lisinopril (ZESTRIL) 20 MG tablet TAKE 1 TABLET BY MOUTH DAILY 100 tablet 2   metFORMIN (GLUCOPHAGE-XR) 500 MG 24 hr tablet TAKE 2 TABLETS BY MOUTH TWICE  DAILY WITH A MEAL 400 tablet 2   potassium chloride SA (KLOR-CON M) 20 MEQ tablet TAKE 2 TABLETS BY MOUTH DAILY 200 tablet 2   rosuvastatin  (CRESTOR) 40 MG tablet TAKE 1 TABLET BY MOUTH DAILY 90 tablet 3   Semaglutide,0.25 or 0.5MG /DOS, 2 MG/3ML SOPN Take 0.5 mg subcutaneous once weekly     solifenacin (VESICARE) 5 MG tablet Take 1 tablet (5 mg total) by mouth daily. 90 tablet 3   venlafaxine XR (EFFEXOR-XR) 75 MG 24 hr capsule TAKE 1 CAPSULE BY MOUTH DAILY 100 capsule 2   No current facility-administered medications on file prior to visit.        ROS:  All others reviewed and negative.  Objective        PE:  BP 122/74 (BP Location: Left Arm, Patient Position: Sitting, Cuff Size: Normal)   Pulse 75   Temp 98.2 F (36.8 C) (Oral)   Ht 6' (1.829 m)   Wt 283 lb (128.4 kg)   SpO2 98%   BMI 38.38 kg/m                 Constitutional: Pt appears in NAD               HENT: Head: NCAT.                Right Ear: External ear normal.                 Left Ear: External ear normal.                Eyes: . Pupils are equal, round, and reactive to light. Conjunctivae and EOM are normal               Nose: without d/c or deformity               Neck: Neck supple. Gross normal ROM               Cardiovascular: Normal rate and regular rhythm.                 Pulmonary/Chest: Effort normal and breath sounds without rales or wheezing.                Abd:  Soft, NT, ND, + BS, no organomegaly               Neurological: Pt is alert. At baseline orientation, motor grossly intact               Skin: Skin is warm. No rashes, no other new lesions, LE edema - trace bilateral               Psychiatric: Pt behavior is normal  without agitation   Micro: none  Cardiac tracings I have personally interpreted today:  none  Pertinent Radiological findings (summarize): none   Lab Results  Component Value Date   WBC 8.4 01/29/2024   HGB 16.0 01/29/2024   HCT 47.1 01/29/2024   PLT 233.0 01/29/2024   GLUCOSE 205 (H) 01/29/2024   CHOL 108 01/29/2024   TRIG 127.0 01/29/2024   HDL 37.70 (L) 01/29/2024   LDLDIRECT 72.0 07/17/2022   LDLCALC 45  01/29/2024   ALT 30 01/29/2024   AST 19 01/29/2024   NA 141 01/29/2024   K 4.2 01/29/2024   CL 103 01/29/2024   CREATININE 0.81 01/29/2024   BUN 17 01/29/2024   CO2 28 01/29/2024   TSH 3.52 01/29/2024   PSA 0.10 01/29/2024   INR 1.0 09/13/2023   HGBA1C 8.5 (H) 01/29/2024   MICROALBUR <0.7 01/29/2024   Assessment/Plan:  Leon Nelson is a 68 y.o. White or Caucasian [1] male with  has a past medical history of ABDOMINAL PAIN OTHER SPECIFIED SITE (07/20/2008), CHEST PAIN (02/06/2011), COLONIC POLYPS, HX OF (05/26/2008), DEPRESSION (05/26/2008), DIABETES MELLITUS, TYPE II (05/26/2008), HYPERLIPIDEMIA (05/26/2008), HYPERTENSION (05/26/2008), HYPOTHYROIDISM (05/26/2008), KNEE PAIN, RIGHT, ACUTE (07/01/2008), Personal history of urinary calculi (05/26/2008), and UTI'S, HX OF (05/26/2008).  Coronary artery disease due to lipid rich plaque For stress testing given possible anginal equivalent  Diabetes mellitus type 2 with neurological manifestations (HCC) Lab Results  Component Value Date   HGBA1C 8.5 (H) 01/29/2024   uncontrolled, pt to continue current medical treatment farxiga, glucotroll xl 10 every day, metfomrin ER 500 mg - 2 every day, ozempic 0.5 mg weekly as declines other change, for CGM rx as wellm libre 3   DOE (dyspnea on exertion) Etiology unclear, for cxr, PFTs, echo and trial albuterol hfa prn  Work stress Ok for work note as reqeusted  Followup: Return if symptoms worsen or fail to improve.  Rosalia Colonel, MD 04/09/2024 9:01 PM [facility]

## 2024-04-09 ENCOUNTER — Other Ambulatory Visit (HOSPITAL_COMMUNITY): Payer: Self-pay

## 2024-04-09 ENCOUNTER — Encounter: Payer: Self-pay | Admitting: Internal Medicine

## 2024-04-09 ENCOUNTER — Telehealth: Payer: Self-pay

## 2024-04-09 DIAGNOSIS — Z566 Other physical and mental strain related to work: Secondary | ICD-10-CM | POA: Insufficient documentation

## 2024-04-09 DIAGNOSIS — I2510 Atherosclerotic heart disease of native coronary artery without angina pectoris: Secondary | ICD-10-CM | POA: Insufficient documentation

## 2024-04-09 NOTE — Assessment & Plan Note (Signed)
 Etiology unclear, for cxr, PFTs, echo and trial albuterol hfa prn

## 2024-04-09 NOTE — Telephone Encounter (Signed)
 Pharmacy Patient Advocate Encounter   Received notification from Onbase that prior authorization for FreeStyle [place] 3 Reader device is required/requested.   Insurance verification completed.   The patient is insured through [employer] .   Per test claim: PA required; PA submitted to above mentioned insurance via CoverMyMeds Key/confirmation #/EOC [facility] Status is pending

## 2024-04-09 NOTE — Assessment & Plan Note (Signed)
 For stress testing given possible anginal equivalent

## 2024-04-09 NOTE — Assessment & Plan Note (Signed)
[place] for work note as reqeusted

## 2024-04-09 NOTE — Assessment & Plan Note (Addendum)
 Lab Results  Component Value Date   HGBA1C 8.5 (H) 01/29/2024   uncontrolled, pt to continue current medical treatment farxiga, glucotroll xl 10 every day, metfomrin ER 500 mg - 2 every day, ozempic 0.5 mg weekly as declines other change, for CGM rx as wellm libre 3

## 2024-04-13 NOTE — Telephone Encounter (Signed)
 Pharmacy Patient Advocate Encounter  Received notification from [facility] that Prior Authorization for FreeStyle [place] 3 Reader device has been DENIED.  See denial reason below. No denial letter attached in CMM. Will attach denial letter to Media tab once received.   PA #/Case ID/Reference #: [id number]

## 2024-04-14 ENCOUNTER — Telehealth: Payer: Self-pay | Admitting: Pharmacist

## 2024-04-14 NOTE — Telephone Encounter (Signed)
 Based on the insurance denial (see below), the patient does not meet the qualifications for an appeal.  He is not currently on insulin  therapy and no documented hypoglycemic events were noted in his chart. If you can provide clinical justification as to why he should be on a CGM, please provide that information and we can attempt an appeal.  Insurance Denial:

## 2024-04-14 NOTE — Telephone Encounter (Signed)
 Copied from CRM [id number]. Topic: Referral - Prior Authorization Question >> Apr 13, 2024  5:29 PM Luane Rumps D wrote: Reason for CRM: Thersia Flax with [employer] in regards to prior authorization for [employer] 3 glucose monitor, prior authorization has been denied and the patient is requesting an appeal   Call-back #: [phone]

## 2024-04-15 ENCOUNTER — Other Ambulatory Visit (HOSPITAL_COMMUNITY): Payer: Self-pay

## 2024-04-17 ENCOUNTER — Ambulatory Visit: Admitting: Internal Medicine

## 2024-04-17 ENCOUNTER — Encounter: Payer: Self-pay | Admitting: Internal Medicine

## 2024-04-17 VITALS — BP 124/78 | HR 67 | Temp 99.4°F | Ht 72.0 in | Wt 281.0 lb

## 2024-04-17 DIAGNOSIS — F32A Depression, unspecified: Secondary | ICD-10-CM | POA: Diagnosis not present

## 2024-04-17 DIAGNOSIS — J069 Acute upper respiratory infection, unspecified: Secondary | ICD-10-CM | POA: Diagnosis not present

## 2024-04-17 DIAGNOSIS — E1149 Type 2 diabetes mellitus with other diabetic neurological complication: Secondary | ICD-10-CM

## 2024-04-17 DIAGNOSIS — I10 Essential (primary) hypertension: Secondary | ICD-10-CM | POA: Diagnosis not present

## 2024-04-17 DIAGNOSIS — Z7985 Long-term (current) use of injectable non-insulin antidiabetic drugs: Secondary | ICD-10-CM | POA: Diagnosis not present

## 2024-04-17 DIAGNOSIS — Z7984 Long term (current) use of oral hypoglycemic drugs: Secondary | ICD-10-CM | POA: Diagnosis not present

## 2024-04-17 MED ORDER — HYDROCODONE BIT-HOMATROP MBR 5-1.5 MG/5ML PO SOLN: 5.0000 mL | Freq: Four times a day (QID) | ORAL | 0 refills | Status: AC | PRN

## 2024-04-17 MED ORDER — LEVOFLOXACIN 500 MG PO TABS: 500.0000 mg | ORAL_TABLET | Freq: Every day | ORAL | 0 refills | Status: DC

## 2024-04-17 NOTE — Assessment & Plan Note (Addendum)
 Lab Results  Component Value Date   HGBA1C 8.5 (H) 01/29/2024   Uncontrolled, pt to continue current medical treatment farxiga  10 every day, glucotrol  xl 10 every day, metformin  ER 500 mg - 2 every day, and new ozempic  0.5 mg weekly - has lost several lbs already, declines other change

## 2024-04-17 NOTE — Assessment & Plan Note (Signed)
Mild to mod, for antibx course levaquin 500 every day, cough med prn,,  to f/u any worsening symptoms or concerns

## 2024-04-17 NOTE — Progress Notes (Signed)
 Patient ID: Leon Nelson, male   DOB: 1956/09/30, 68 y.o.   MRN: [id number]        Chief Complaint: follow up .sinus infection, htn, depression, dm       HPI:  Leon Nelson is a 68 y.o. male  Here with 2-3 days acute onset fever, facial pain, pressure, headache, general weakness and malaise, and greenish d/c, with mild ST and cough, but pt denies chest pain, wheezing, increased sob or doe, orthopnea, PND, increased LE swelling, palpitations, dizziness or syncope.   Pt denies polydipsia, polyuria, or new focal neuro s/s.    Pt denies night sweats, loss of appetite, or other constitutional symptoms  Denies worsening depressive symptoms, suicidal ideation, or panic;       Wt Readings from Last 3 Encounters:  04/17/24 281 lb (127.5 kg)  04/07/24 283 lb (128.4 kg)  01/29/24 285 lb (129.3 kg)   BP Readings from Last 3 Encounters:  04/17/24 124/78  04/07/24 122/74  01/29/24 128/70         Past Medical History:  Diagnosis Date   ABDOMINAL PAIN OTHER SPECIFIED SITE 07/20/2008   CHEST PAIN 02/06/2011   COLONIC POLYPS, HX OF 05/26/2008   DEPRESSION 05/26/2008   DIABETES MELLITUS, TYPE II 05/26/2008   HYPERLIPIDEMIA 05/26/2008   HYPERTENSION 05/26/2008   HYPOTHYROIDISM 05/26/2008   KNEE PAIN, RIGHT, ACUTE 07/01/2008   Personal history of urinary calculi 05/26/2008   UTI'S, HX OF 05/26/2008   Past Surgical History:  Procedure Laterality Date   s/p right knee arthroplasty      complicated by patellar tendon rupture Jan 2011 Dr. Dante Dyer   TONSILLECTOMY      reports that he has never smoked. He has never used smokeless tobacco. He reports that he does not drink alcohol and does not use drugs. family history includes Alcohol abuse in an other family member; Cancer in his mother; Diabetes in an other family member; Heart disease in his father and mother; Hyperlipidemia in his father and mother. Allergies  Allergen Reactions   Penicillins Other (See Comments)    Blisters on hands   Sulfonamide Derivatives  Other (See Comments)    Blisters in groin area   Current Outpatient Medications on File Prior to Visit  Medication Sig Dispense Refill   albuterol  (VENTOLIN  HFA) 108 (90 Base) MCG/ACT inhaler Inhale 2 puffs into the lungs every 6 (six) hours as needed for wheezing or shortness of breath. 8 g 11   allopurinol  (ZYLOPRIM ) 300 MG tablet TAKE 1 TABLET BY MOUTH DAILY 100 tablet 2   aspirin  EC 81 MG tablet Take 1 tablet (81 mg total) by mouth daily. Swallow whole.     atenolol -chlorthalidone  (TENORETIC ) 50-25 MG tablet TAKE ONE-HALF TABLET BY MOUTH  DAILY 50 tablet 2   Blood Glucose Monitoring Suppl (ONE TOUCH ULTRA 2) w/Device KIT Use as directed once daily E11.9 1 kit 0   Continuous Glucose Receiver (FREESTYLE LIBRE 3 READER) DEVI Use as directed four times per day E11.9 1 each 0   dapagliflozin  propanediol (FARXIGA ) 10 MG TABS tablet Take 1 tablet (10 mg total) by mouth daily before breakfast. 90 tablet 3   diclofenac  sodium (VOLTAREN ) 1 % GEL Apply 2 g topically 4 (four) times daily as needed. 200 g 5   dicyclomine  (BENTYL ) 20 MG tablet TAKE 1 TABLET BY MOUTH 3 TIMES  DAILY AS NEEDED 300 tablet 3   gabapentin  (NEURONTIN ) 300 MG capsule TAKE 1 CAPSULE BY MOUTH TWICE  DAILY 200 capsule 2  glipiZIDE  (GLUCOTROL  XL) 10 MG 24 hr tablet TAKE 1 TABLET BY MOUTH DAILY  WITH BREAKFAST 100 tablet 2   glucose blood (ONE TOUCH ULTRA TEST) test strip Use as instructed once daily E11.9 100 each 3   ketoconazole (NIZORAL) 2 % shampoo Apply 1 Application topically 3 (three) times a week.     Lancets (ONETOUCH DELICA PLUS LANCET30G) MISC USE AS DIRECTED ONCE DAILY 100 each 2   Lancets MISC Use as directed once daily E11.9 100 each 3   levothyroxine  (SYNTHROID ) 50 MCG tablet TAKE 1 TABLET BY MOUTH DAILY 60 tablet 5   lisinopril  (ZESTRIL ) 20 MG tablet TAKE 1 TABLET BY MOUTH DAILY 100 tablet 2   metFORMIN  (GLUCOPHAGE -XR) 500 MG 24 hr tablet TAKE 2 TABLETS BY MOUTH TWICE  DAILY WITH A MEAL 400 tablet 2   potassium  chloride SA (KLOR-CON  M) 20 MEQ tablet TAKE 2 TABLETS BY MOUTH DAILY 200 tablet 2   rosuvastatin  (CRESTOR ) 40 MG tablet TAKE 1 TABLET BY MOUTH DAILY 90 tablet 3   Semaglutide ,0.25 or 0.5MG /DOS, 2 MG/3ML SOPN Take 0.5 mg subcutaneous once weekly     solifenacin  (VESICARE ) 5 MG tablet Take 1 tablet (5 mg total) by mouth daily. 90 tablet 3   venlafaxine  XR (EFFEXOR -XR) 75 MG 24 hr capsule TAKE 1 CAPSULE BY MOUTH DAILY 100 capsule 2   Continuous Glucose Sensor (FREESTYLE LIBRE 3 PLUS SENSOR) MISC Change sensor every 15 days. E11.9 (Patient not taking: Reported on 04/17/2024) 6 each 3   No current facility-administered medications on file prior to visit.        ROS:  All others reviewed and negative.  Objective        PE:  BP 124/78 (BP Location: Right Arm, Patient Position: Sitting, Cuff Size: Normal)   Pulse 67   Temp 99.4 F (37.4 C) (Oral)   Ht 6' (1.829 m)   Wt 281 lb (127.5 kg)   SpO2 98%   BMI 38.11 kg/m                 Constitutional: Pt appears mild ill               HENT: Head: NCAT.                Right Ear: External ear normal.                 Left Ear: External ear normal. Bilat tm's with mild erythema.  Max sinus areas mild tender.  Pharynx with mild erythema, no exudate                Eyes: . Pupils are equal, round, and reactive to light. Conjunctivae and EOM are normal               Nose: without d/c or deformity               Neck: Neck supple. Gross normal ROM               Cardiovascular: Normal rate and regular rhythm.                 Pulmonary/Chest: Effort normal and breath sounds without rales or wheezing.                               Neurological: Pt is alert. At baseline orientation, motor grossly intact  Skin: Skin is warm. No rashes, no other new lesions, LE edema - none               Psychiatric: Pt behavior is normal without agitation   Micro: none  Cardiac tracings I have personally interpreted today:  none  Pertinent Radiological  findings (summarize): none   Lab Results  Component Value Date   WBC 8.4 01/29/2024   HGB 16.0 01/29/2024   HCT 47.1 01/29/2024   PLT 233.0 01/29/2024   GLUCOSE 205 (H) 01/29/2024   CHOL 108 01/29/2024   TRIG 127.0 01/29/2024   HDL 37.70 (L) 01/29/2024   LDLDIRECT 72.0 07/17/2022   LDLCALC 45 01/29/2024   ALT 30 01/29/2024   AST 19 01/29/2024   NA 141 01/29/2024   K 4.2 01/29/2024   CL 103 01/29/2024   CREATININE 0.81 01/29/2024   BUN 17 01/29/2024   CO2 28 01/29/2024   TSH 3.52 01/29/2024   PSA 0.10 01/29/2024   INR 1.0 09/13/2023   HGBA1C 8.5 (H) 01/29/2024   MICROALBUR <0.7 01/29/2024   Assessment/Plan:  Leon Nelson is a 68 y.o. White or Caucasian [1] male with  has a past medical history of ABDOMINAL PAIN OTHER SPECIFIED SITE (07/20/2008), CHEST PAIN (02/06/2011), COLONIC POLYPS, HX OF (05/26/2008), DEPRESSION (05/26/2008), DIABETES MELLITUS, TYPE II (05/26/2008), HYPERLIPIDEMIA (05/26/2008), HYPERTENSION (05/26/2008), HYPOTHYROIDISM (05/26/2008), KNEE PAIN, RIGHT, ACUTE (07/01/2008), Personal history of urinary calculi (05/26/2008), and UTI'S, HX OF (05/26/2008).  Diabetes mellitus type 2 with neurological manifestations (HCC) Lab Results  Component Value Date   HGBA1C 8.5 (H) 01/29/2024   Uncontrolled, pt to continue current medical treatment farxiga  10 every day, glucotrol  xl 10 every day, metformin  ER 500 mg - 2 every day, and new ozempic  0.5 mg weekly - has lost several lbs already, declines other change   Depression Denies worsening depressive symptoms, suicidal ideation, or panic; cont current med tx,  to f/u any worsening symptoms or concerns   Essential hypertension BP Readings from Last 3 Encounters:  04/17/24 124/78  04/07/24 122/74  01/29/24 128/70   Stable, pt to continue medical treatment tenoretic  50 25 every day, lisinopril  20 qd   Acute upper respiratory infection Mild to mod, for antibx course levaquin  500 every day, cough med prn,,  to f/u any worsening  symptoms or concerns  Followup: Return if symptoms worsen or fail to improve.  Rosalia Colonel, MD 04/17/2024 9:12 PM [facility] [place] [facility] - [facility]

## 2024-04-17 NOTE — Assessment & Plan Note (Signed)
 Denies worsening depressive symptoms, suicidal ideation, or panic; cont current med tx,  to f/u any worsening symptoms or concerns

## 2024-04-17 NOTE — Patient Instructions (Signed)
 Please take all new medication as prescribed - the antibiotic, and cough medicine as needed  Please continue all other medications as before, and refills have been done if requested.  Please have the pharmacy call with any other refills you may need.  Please keep your appointments with your specialists as you may have planned

## 2024-04-17 NOTE — Assessment & Plan Note (Signed)
 BP Readings from Last 3 Encounters:  04/17/24 124/78  04/07/24 122/74  01/29/24 128/70   Stable, pt to continue medical treatment tenoretic  50 25 every day, lisinopril  20 qd

## 2024-04-18 ENCOUNTER — Encounter: Payer: Self-pay | Admitting: Internal Medicine

## 2024-04-21 ENCOUNTER — Other Ambulatory Visit: Payer: Self-pay | Admitting: Internal Medicine

## 2024-04-21 ENCOUNTER — Other Ambulatory Visit: Payer: Self-pay

## 2024-04-22 ENCOUNTER — Telehealth: Payer: Self-pay | Admitting: Internal Medicine

## 2024-04-22 NOTE — Telephone Encounter (Signed)
 Ok for pt to make ROV at any time, but may not need it if he agrees to referral to Counseling, and maybe a period of time out of work such as 30 days.

## 2024-04-22 NOTE — Telephone Encounter (Signed)
 Copied from CRM [id number]. 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 Patient would like to be placed on leave until he gets all the testing neccessary done to find out what is going on with him. He stated that some of the tests are already scheduled. Patient would like to know if he can send in the paperwork to be filled out or if an appointment is needed before the paperwork.

## 2024-04-30 NOTE — Progress Notes (Addendum)
 Cardiology Office Note:  .   Date:  04/30/2024 ID:  Leon Nelson, DOB [birth year], MRN [id number] PCP: Roslyn Coombe, MD [facility] Providers Cardiologist:  None { Click to update primary MD,subspecialty MD or APP then REFRESH:1}   Patient Profile: .      PMH Dyslipidemia Hypertension Dilatation of ascending aorta Coronary artery disease CT Calcium  score 02/26/2024  CAC Score 692 (80th percentile) LM 0, LAD 588, LCx 24.5, RCA 79.6 Type 2 diabetes Obesity  Previously seen by Dr. Katheryne Pane for evaluation due to history of presyncope. History of hypertrophic cardiomyopathy in his mother who underwent septal myectomy at [facility] in 1996-05-11. He was apparently genetically tested and did not have the gene. HIs father died in [facility] in 05-11-1998, no prior history of heart disease to his awareness. Starting having episodes of presyncope a month prior to telemedicine appointment 05/2019 with no LOC. Echo 06/12/2019 revealed normal LVEF, mild LVH, impaired relaxation, no significant valve disease.  Event monitor showed no arrhythmias that could have contributed to syncope.  BP was well-controlled.  He had a history of hyperlipidemia with LDL 65 on statin.       History of Present Illness: .   Leon Nelson is a *** 68 y.o. male  who is here today for new patient consult for ***  Family history: His family history includes Alcohol abuse in an other family member; Cancer in his mother; Diabetes in an other family member; Heart disease in his father and mother; Hyperlipidemia in his father and mother.   Discussed the use of AI scribe software for clinical note transcription with the patient, who gave verbal consent to proceed.  ASCVD Risk Score:  RESULT SUMMARY: 26.9 % Risk of cardiovascular event (coronary or stroke death or non-fatal MI or stroke) in next 10 years.   High-intensity statin recommended because of known diabetes and 10-year risk >7.5%.   If LDL 70mg /dl (4.09 mmol/L), additional  factors like lifestyle and risk-benefits can be considered before starting statins  To view statin dosages by intensity, see Evidence section. INPUTS: History of ASCVD --> 0 = No LDL Cholesterol >=190mg /dL (8.11 mmol/L) --> 0 = No Age --> 67 years Diabetes --> 1 = Yes Sex --> 1 = Male Total Cholesterol --> 108 mg/dL HDL Cholesterol --> 37 mg/dL Systolic Blood Pressure --> 122 mm Hg Treatment for Hypertension --> 1 = Yes Smoker --> 0 = No Race --> 1 = White   Diet:  Activity:   No results found for: "LIPOA"    ROS: ***       Studies Reviewed: .          *** Risk Assessment/Calculations:   {Does this patient have ATRIAL FIBRILLATION?:[phone]} No BP recorded.  {Refresh Note OR Click here to enter BP  :1}***       Physical Exam:   VS: There were no vitals taken for this visit.  Wt Readings from Last 3 Encounters:  04/17/24 281 lb (127.5 kg)  04/07/24 283 lb (128.4 kg)  01/29/24 285 lb (129.3 kg)     GEN: Well nourished, well developed in no acute distress NECK: No JVD; No carotid bruits CARDIAC: ***RRR, no murmurs, rubs, gallops RESPIRATORY:  Clear to auscultation without rales, wheezing or rhonchi  ABDOMEN: Soft, non-tender, non-distended EXTREMITIES:  No edema; No deformity     ASSESSMENT AND PLAN: .    CV Risk Assessment  Plan/Goals:  Activity: ***      {Are you ordering a  CV Procedure (e.g. stress test, cath, DCCV, TEE, etc)?   Press F2        :[id number]}  Dispo: ***  Signed, Slater Duncan, NP-C

## 2024-05-05 ENCOUNTER — Ambulatory Visit: Payer: Self-pay | Admitting: *Deleted

## 2024-05-05 NOTE — Telephone Encounter (Signed)
 Patient only wants PCP- but can only come tomorrow- after 9:00- he will not be available Th/Fr Chief Complaint: Patient states he is getting ready to go out of town and his wife wants him seen- he is having some RUQ discomfort Symptoms: discomfort- not pain- 3/10, daily- worse throughout the day Frequency: 1 week Pertinent Negatives: Patient denies Chest pain- changes in SOB Disposition: [] ED /[] Urgent Care (no appt availability in office) / [] Appointment(In office/virtual)/ []  [facility]/ [] Home Care/ [] Refused Recommended Disposition /[] [facility]/ []  Follow-up with PCP Additional Notes: Offered appointment with another provider- but patient only want to see PCP- can call him or schedule him tomorrow- he will be available after his per carology testing appointment.     Copied from CRM [id number]. Topic: Clinical - Red Word Triage >> May 05, 2024  3:51 PM Howard Macho wrote: Reason for CRM: patient called stating he is going out of town and he is having pain and discomfort under his right rib at the bottom. Patient states it is sore when he press on it and hurt at night when he lays down. Pain level a 4 out of 10 Reason for Disposition  [1] MILD pain (e.g., does not interfere with normal activities) AND [2] comes and goes (cramps) AND [3] present > 72 hours  (Exception: This same abdominal pain is a chronic symptom recurrent or ongoing AND present > 4 weeks.)  Answer Assessment - Initial Assessment Questions 1. LOCATION: "Where does it hurt?"      Right  2. RADIATION: "Does the pain shoot anywhere else?" (e.g., chest, back)     Discomfort at that area only 3. ONSET: "When did the pain begin?" (e.g., minutes, hours or days ago)      1 week +  5. PATTERN "Does the pain come and go, or is it constant?"    - If it comes and goes: "How long does it last?" "Do you have pain now?"     (Note: Comes and goes means the pain is intermittent. It goes away completely between bouts.)     - If constant: "Is it getting better, staying the same, or getting worse?"      (Note: Constant means the pain never goes away completely; most serious pain is constant and gets worse.)      Comes and goes- more prominent during the day 6. SEVERITY: "How bad is the pain?"  (e.g., Scale 1-10; mild, moderate, or severe)    - MILD (1-3): Doesn't interfere with normal activities, abdomen soft and not tender to touch..     - MODERATE (4-7): Interferes with normal activities or awakens from sleep, abdomen tender to touch.     - SEVERE (8-10): Excruciating pain, doubled over, unable to do any normal activities.       3-4/10 7. RECURRENT SYMPTOM: "Have you ever had this type of stomach pain before?" If Yes, ask: "When was the last time?" and "What happened that time?"      no 8. AGGRAVATING FACTORS: "Does anything seem to cause this pain?" (e.g., foods, stress, alcohol)     no 9. CARDIAC SYMPTOMS: "Do you have any of the following symptoms: chest pain, difficulty breathing, sweating, nausea?"     Cardiology appointment tomorrow- some SOB- PCP aware 10. OTHER SYMPTOMS: "Do you have any other symptoms?" (e.g., back pain, diarrhea, fever, urination pain, vomiting)       no  Protocols used: Abdominal Pain - Upper-A-AH

## 2024-05-05 NOTE — Telephone Encounter (Signed)
 Called and scheduled Pt for OV.

## 2024-05-06 ENCOUNTER — Ambulatory Visit (HOSPITAL_BASED_OUTPATIENT_CLINIC_OR_DEPARTMENT_OTHER): Admitting: Nurse Practitioner

## 2024-05-06 ENCOUNTER — Encounter (HOSPITAL_BASED_OUTPATIENT_CLINIC_OR_DEPARTMENT_OTHER): Payer: Self-pay | Admitting: Nurse Practitioner

## 2024-05-06 VITALS — BP 114/72 | HR 63 | Ht 72.0 in | Wt 284.0 lb

## 2024-05-06 DIAGNOSIS — R931 Abnormal findings on diagnostic imaging of heart and coronary circulation: Secondary | ICD-10-CM

## 2024-05-06 DIAGNOSIS — I10 Essential (primary) hypertension: Secondary | ICD-10-CM

## 2024-05-06 DIAGNOSIS — I2583 Coronary atherosclerosis due to lipid rich plaque: Secondary | ICD-10-CM | POA: Diagnosis not present

## 2024-05-06 DIAGNOSIS — E1149 Type 2 diabetes mellitus with other diabetic neurological complication: Secondary | ICD-10-CM

## 2024-05-06 DIAGNOSIS — I2510 Atherosclerotic heart disease of native coronary artery without angina pectoris: Secondary | ICD-10-CM

## 2024-05-06 DIAGNOSIS — R0602 Shortness of breath: Secondary | ICD-10-CM | POA: Diagnosis not present

## 2024-05-06 DIAGNOSIS — I7121 Aneurysm of the ascending aorta, without rupture: Secondary | ICD-10-CM

## 2024-05-06 DIAGNOSIS — E785 Hyperlipidemia, unspecified: Secondary | ICD-10-CM

## 2024-05-06 NOTE — Patient Instructions (Signed)
 Medication Instructions:   Your physician recommends that you continue on your current medications as directed. Please refer to the Current Medication list given to you today.   *If you need a refill on your cardiac medications before your next appointment, please call your pharmacy*  Lab Work:  None ordered.  If you have labs (blood work) drawn today and your tests are completely normal, you will receive your results only by: MyChart Message (if you have MyChart) OR A paper copy in the mail If you have any lab test that is abnormal or we need to change your treatment, we will call you to review the results.  Testing/Procedures:     Please report to Radiology at the [facility] Main Entrance 30 minutes early for your test.  [address] [place], [place] [address]   How to Prepare for Your Cardiac PET/CT Stress Test:  Nothing to eat or drink, except water, 3 hours prior to arrival time.  NO caffeine/decaffeinated products, or chocolate 12 hours prior to arrival. (Please note decaffeinated beverages (teas/coffees) still contain caffeine).  If you have caffeine within 12 hours prior, the test will need to be rescheduled.  Medication instructions: Do not take erectile dysfunction medications for 72 hours prior to test (sildenafil , tadalafil)  Diabetic Preparation: If able to eat breakfast prior to 3 hour fasting, you may take all medications, including your insulin . Do not worry if you miss your breakfast dose of insulin  - start at your next meal. If you do not eat prior to 3 hour fast-Hold all diabetes (oral and insulin ) medications. Patients who wear a continuous glucose monitor MUST remove the device prior to scanning.  You may take your remaining medications with water.  NO cologne or lotion on chest or abdomen area.  Total time is 1 to 2 hours; you may want to bring reading material for the waiting time.   In preparation for your appointment, medication and  supplies will be purchased.  Appointment availability is limited, so if you need to cancel or reschedule, please call the Radiology Department Scheduler at [phone] 24 hours in advance to avoid a cancellation fee of $100.00  What to Expect When you Arrive:  Once you arrive and check in for your appointment, you will be taken to a preparation room within the Radiology Department.  A technologist or Nurse will obtain your medical history, verify that you are correctly prepped for the exam, and explain the procedure.  Afterwards, an IV will be started in your arm and electrodes will be placed on your skin for EKG monitoring during the stress portion of the exam. Then you will be escorted to the PET/CT scanner.  There, staff will get you positioned on the scanner and obtain a blood pressure and EKG.  During the exam, you will continue to be connected to the EKG and blood pressure machines.  A small, safe amount of a radioactive tracer will be injected in your IV to obtain a series of pictures of your heart along with an injection of a stress agent.    After your Exam:  It is recommended that you eat a meal and drink a caffeinated beverage to counter act any effects of the stress agent.  Drink plenty of fluids for the remainder of the day and urinate frequently for the first couple of hours after the exam.  Your doctor will inform you of your test results within 7-10 business days.  For more information and frequently asked  questions, please visit our website: [email or web address]  For questions about your test or how to prepare for your test, please call: Cardiac Imaging Nurse Navigators Office: [phone]   Follow-Up: At [facility], you and your health needs are our priority.  As part of our continuing mission to provide you with exceptional heart care, our providers are all part of one team.  This team includes your primary Cardiologist (physician) and Advanced Practice  Providers or APPs (Physician Assistants and Nurse Practitioners) who all work together to provide you with the care you need, when you need it.  Your next appointment:   4 month(s)  Provider:   Slater Duncan, NP    We recommend signing up for the patient portal called "MyChart".  Sign up information is provided on this After Visit Summary.  MyChart is used to connect with patients for Virtual Visits (Telemedicine).  Patients are able to view lab/test results, encounter notes, upcoming appointments, etc.  Non-urgent messages can be sent to your provider as well.   To learn more about what you can do with MyChart, go to [email or web address].   Other Instructions  Goals: 1: Consider getting a stand up desk and walking pad or use a head set for work so that you can get more steps per day 2: Heart healthy diet consisting of mostly lean protein (fish, chicken, [place]), vegetables, fruits, and whole grains

## 2024-05-12 ENCOUNTER — Ambulatory Visit: Admitting: Podiatry

## 2024-05-14 ENCOUNTER — Ambulatory Visit: Payer: Medicare Other | Admitting: Podiatry

## 2024-05-19 ENCOUNTER — Ambulatory Visit (INDEPENDENT_AMBULATORY_CARE_PROVIDER_SITE_OTHER): Admitting: Internal Medicine

## 2024-05-19 ENCOUNTER — Ambulatory Visit: Payer: Self-pay | Admitting: Internal Medicine

## 2024-05-19 ENCOUNTER — Encounter: Payer: Self-pay | Admitting: Internal Medicine

## 2024-05-19 ENCOUNTER — Encounter (HOSPITAL_COMMUNITY)

## 2024-05-19 ENCOUNTER — Ambulatory Visit (HOSPITAL_COMMUNITY)
Admission: RE | Admit: 2024-05-19 | Discharge: 2024-05-19 | Disposition: A | Discharge status: Home / Self Care | Source: Ambulatory Visit | Attending: Cardiology | Admitting: Cardiology

## 2024-05-19 VITALS — BP 124/82 | HR 100 | Temp 99.1°F | Ht 72.0 in | Wt 279.0 lb

## 2024-05-19 DIAGNOSIS — E7800 Pure hypercholesterolemia, unspecified: Secondary | ICD-10-CM | POA: Insufficient documentation

## 2024-05-19 DIAGNOSIS — E538 Deficiency of other specified B group vitamins: Secondary | ICD-10-CM | POA: Diagnosis not present

## 2024-05-19 DIAGNOSIS — I2583 Coronary atherosclerosis due to lipid rich plaque: Secondary | ICD-10-CM | POA: Insufficient documentation

## 2024-05-19 DIAGNOSIS — I10 Essential (primary) hypertension: Secondary | ICD-10-CM

## 2024-05-19 DIAGNOSIS — R0609 Other forms of dyspnea: Secondary | ICD-10-CM | POA: Insufficient documentation

## 2024-05-19 DIAGNOSIS — M545 Low back pain, unspecified: Secondary | ICD-10-CM | POA: Insufficient documentation

## 2024-05-19 DIAGNOSIS — R1011 Right upper quadrant pain: Secondary | ICD-10-CM | POA: Diagnosis not present

## 2024-05-19 DIAGNOSIS — K645 Perianal venous thrombosis: Secondary | ICD-10-CM | POA: Insufficient documentation

## 2024-05-19 DIAGNOSIS — E559 Vitamin D deficiency, unspecified: Secondary | ICD-10-CM | POA: Diagnosis not present

## 2024-05-19 DIAGNOSIS — R059 Cough, unspecified: Secondary | ICD-10-CM | POA: Insufficient documentation

## 2024-05-19 DIAGNOSIS — I2510 Atherosclerotic heart disease of native coronary artery without angina pectoris: Secondary | ICD-10-CM | POA: Diagnosis not present

## 2024-05-19 DIAGNOSIS — Z7984 Long term (current) use of oral hypoglycemic drugs: Secondary | ICD-10-CM

## 2024-05-19 DIAGNOSIS — M2550 Pain in unspecified joint: Secondary | ICD-10-CM | POA: Insufficient documentation

## 2024-05-19 DIAGNOSIS — E1149 Type 2 diabetes mellitus with other diabetic neurological complication: Secondary | ICD-10-CM | POA: Diagnosis not present

## 2024-05-19 HISTORY — DX: Pure hypercholesterolemia, unspecified: E78.00

## 2024-05-19 LAB — CBC WITH DIFFERENTIAL/PLATELET
Basophils Absolute: 0 10*3/uL (ref 0.0–0.1)
Basophils Relative: 0.4 % (ref 0.0–3.0)
Eosinophils Absolute: 0.4 10*3/uL (ref 0.0–0.7)
Eosinophils Relative: 5.5 % — ABNORMAL HIGH (ref 0.0–5.0)
HCT: 45.2 % (ref 39.0–52.0)
Hemoglobin: 15.1 g/dL (ref 13.0–17.0)
Lymphocytes Relative: 33.9 % (ref 12.0–46.0)
Lymphs Abs: 2.7 10*3/uL (ref 0.7–4.0)
MCHC: 33.4 g/dL (ref 30.0–36.0)
MCV: 85.5 fl (ref 78.0–100.0)
Monocytes Absolute: 0.5 10*3/uL (ref 0.1–1.0)
Monocytes Relative: 6.6 % (ref 3.0–12.0)
Neutro Abs: 4.2 10*3/uL (ref 1.4–7.7)
Neutrophils Relative %: 53.6 % (ref 43.0–77.0)
Platelets: 219 10*3/uL (ref 150.0–400.0)
RBC: 5.29 Mil/uL (ref 4.22–5.81)
RDW: 13.8 % (ref 11.5–15.5)
WBC: 7.8 10*3/uL (ref 4.0–10.5)

## 2024-05-19 LAB — URINALYSIS, ROUTINE W REFLEX MICROSCOPIC
Bilirubin Urine: NEGATIVE
Hgb urine dipstick: NEGATIVE
Ketones, ur: NEGATIVE
Leukocytes,Ua: NEGATIVE
Nitrite: NEGATIVE
RBC / HPF: NONE SEEN (ref 0–?)
Specific Gravity, Urine: 1.01 (ref 1.000–1.030)
Total Protein, Urine: NEGATIVE
Urine Glucose: >=1000 — AB
Urobilinogen, UA: 0.2 (ref 0.0–1.0)
WBC, UA: NONE SEEN (ref 0–?)
pH: 7 (ref 5.0–8.0)

## 2024-05-19 LAB — BASIC METABOLIC PANEL WITH GFR
BUN: 20 mg/dL (ref 6–23)
CO2: 29 meq/L (ref 19–32)
Calcium: 9.8 mg/dL (ref 8.4–10.5)
Chloride: 104 meq/L (ref 96–112)
Creatinine, Ser: 0.72 mg/dL (ref 0.40–1.50)
GFR: 93.26 mL/min (ref >60.00)
Glucose, Bld: 157 mg/dL — ABNORMAL HIGH (ref 70–99)
Potassium: 3.7 meq/L (ref 3.5–5.1)
Sodium: 143 meq/L (ref 135–145)

## 2024-05-19 LAB — HEPATIC FUNCTION PANEL
ALT: 25 U/L (ref 0–53)
AST: 20 U/L (ref 0–37)
Albumin: 4.4 g/dL (ref 3.5–5.2)
Alkaline Phosphatase: 41 U/L (ref 39–117)
Bilirubin, Direct: 0.1 mg/dL (ref 0.0–0.3)
Total Bilirubin: 0.3 mg/dL (ref 0.2–1.2)
Total Protein: 7.2 g/dL (ref 6.0–8.3)

## 2024-05-19 LAB — LIPASE: Lipase: 13 U/L (ref 11.0–59.0)

## 2024-05-19 LAB — ECHOCARDIOGRAM COMPLETE
Area-P 1/2: 3.51 cm2
Est EF: 55
P 1/2 time: 1530 ms
S' Lateral: 3.2 cm

## 2024-05-19 NOTE — Assessment & Plan Note (Addendum)
 Lab Results  Component Value Date   HGBA1C 8.5 (H) 01/29/2024   uncontrolled, pt to continue current medical treatment farxiga  10 mg , glipizide  xl 10 mg every day, metformin  ER 500 mg - 2 bid, and ozemipc 0.5 mg weekly decliens other change today

## 2024-05-19 NOTE — Assessment & Plan Note (Signed)
 Lab Results  Component Value Date   VITAMINB12 645 01/29/2024   Stable, cont oral replacement - b12 1000 mcg qd

## 2024-05-19 NOTE — Patient Instructions (Signed)
 Please continue all other medications as before, and refills have been done if requested.  Please have the pharmacy call with any other refills you may need.  Please continue your efforts at being more active, low cholesterol diet, and weight control.  You are otherwise up to date with prevention measures today.  Please keep your appointments with your specialists as you may have planned  You will be contacted regarding the referral for: abdomen ultrasound  Please go to the LAB at the blood drawing area for the tests to be done  You will be contacted by phone if any changes need to be made immediately.  Otherwise, you will receive a letter about your results with an explanation, but please check with MyChart first.

## 2024-05-19 NOTE — Assessment & Plan Note (Signed)
 Last vitamin D  Lab Results  Component Value Date   VD25OH 67.31 01/29/2024   Stable, cont oral replacement

## 2024-05-19 NOTE — Progress Notes (Signed)
 Patient ID: Leon Nelson, male   DOB: [birth year], 68 y.o.   MRN: [id number]        Chief Complaint: follow up RUQ pain x 1 wk but none in last 1-2 days       HPI:  Leon Nelson is a 68 y.o. male here with c/o moderate dull RUQ pain for several days starting 1 wk ago, but none in past 2 days, though still has some soreness to palpate the area.  Has some radiation to the right flank area, but no spinal pain.  No rash, Denies worsening reflux, dysphagia, n/v, bowel change or blood.   Pt denies chest pain, increased sob or doe, wheezing, orthopnea, PND, increased LE swelling, palpitations, dizziness or syncope.  Lost 5 lbs intentionally with better diet. Wt Readings from Last 3 Encounters:  05/19/24 279 lb (126.6 kg)  05/06/24 284 lb (128.8 kg)  04/17/24 281 lb (127.5 kg)   BP Readings from Last 3 Encounters:  05/19/24 124/82  05/06/24 114/72  04/17/24 124/78         Past Medical History:  Diagnosis Date   ABDOMINAL PAIN OTHER SPECIFIED SITE 07/20/2008   CHEST PAIN 02/06/2011   COLONIC POLYPS, HX OF 05/26/2008   DEPRESSION 05/26/2008   DIABETES MELLITUS, TYPE II 05/26/2008   HYPERLIPIDEMIA 05/26/2008   HYPERTENSION 05/26/2008   HYPOTHYROIDISM 05/26/2008   KNEE PAIN, RIGHT, ACUTE 07/01/2008   Personal history of urinary calculi 05/26/2008   Pure hypercholesterolemia 05/19/2024   UTI'S, HX OF 05/26/2008   Past Surgical History:  Procedure Laterality Date   s/p right knee arthroplasty      complicated by patellar tendon rupture Jan 2011 Dr. Dante Dyer   TONSILLECTOMY      reports that he has never smoked. He has never used smokeless tobacco. He reports that he does not drink alcohol and does not use drugs. family history includes Alcohol abuse in an other family member; Cancer in his mother; Diabetes in an other family member; Heart disease in his father and mother; Hyperlipidemia in his father and mother. Allergies  Allergen Reactions   Penicillins Other (See Comments)     Blisters on hands   Sulfonamide Derivatives Other (See Comments)    Blisters in groin area   Current Outpatient Medications on File Prior to Visit  Medication Sig Dispense Refill   albuterol  (VENTOLIN  HFA) 108 (90 Base) MCG/ACT inhaler Inhale 2 puffs into the lungs every 6 (six) hours as needed for wheezing or shortness of breath. 8 g 11   allopurinol  (ZYLOPRIM ) 300 MG tablet TAKE 1 TABLET BY MOUTH DAILY 100 tablet 2   aspirin  EC 81 MG tablet Take 1 tablet (81 mg total) by mouth daily. Swallow whole.     atenolol -chlorthalidone  (TENORETIC ) 50-25 MG tablet TAKE ONE-HALF TABLET BY MOUTH  DAILY 50 tablet 2   Blood Glucose Monitoring Suppl (ONE TOUCH ULTRA 2) w/Device KIT Use as directed once daily E11.9 1 kit 0   Continuous Glucose Receiver (FREESTYLE LIBRE 3 READER) DEVI Use as directed four times per day E11.9 1 each 0   Continuous Glucose Sensor (FREESTYLE LIBRE 3 PLUS SENSOR) MISC Change sensor every 15 days. E11.9 6 each 3   dapagliflozin  propanediol (FARXIGA ) 10 MG TABS tablet Take 1 tablet (10 mg total) by mouth daily before breakfast. 90 tablet 3   diclofenac  sodium (VOLTAREN ) 1 % GEL Apply 2 g topically 4 (four) times daily as needed. 200 g 5   dicyclomine  (BENTYL ) 20 MG tablet TAKE 1  TABLET BY MOUTH 3 TIMES  DAILY AS NEEDED 300 tablet 3   gabapentin  (NEURONTIN ) 300 MG capsule TAKE 1 CAPSULE BY MOUTH TWICE  DAILY 200 capsule 2   glipiZIDE  (GLUCOTROL  XL) 10 MG 24 hr tablet TAKE 1 TABLET BY MOUTH DAILY  WITH BREAKFAST 100 tablet 2   glucose blood (ONE TOUCH ULTRA TEST) test strip Use as instructed once daily E11.9 100 each 3   ketoconazole (NIZORAL) 2 % shampoo Apply 1 Application topically 3 (three) times a week.     Lancets (ONETOUCH DELICA PLUS LANCET30G) MISC USE AS DIRECTED ONCE DAILY 100 each 2   Lancets MISC Use as directed once daily E11.9 100 each 3   levofloxacin  (LEVAQUIN ) 500 MG tablet Take 1 tablet (500 mg total) by mouth daily. 10 tablet 0   levothyroxine  (SYNTHROID ) 50 MCG  tablet TAKE 1 TABLET BY MOUTH DAILY 60 tablet 5   lisinopril  (ZESTRIL ) 20 MG tablet TAKE 1 TABLET BY MOUTH DAILY 100 tablet 2   metFORMIN  (GLUCOPHAGE -XR) 500 MG 24 hr tablet TAKE 2 TABLETS BY MOUTH TWICE  DAILY WITH A MEAL 400 tablet 2   potassium chloride  SA (KLOR-CON  M) 20 MEQ tablet TAKE 2 TABLETS BY MOUTH DAILY 200 tablet 2   rosuvastatin  (CRESTOR ) 40 MG tablet TAKE 1 TABLET BY MOUTH DAILY 90 tablet 3   Semaglutide ,0.25 or 0.5MG /DOS, 2 MG/3ML SOPN Take 0.5 mg subcutaneous once weekly     solifenacin  (VESICARE ) 5 MG tablet Take 1 tablet by mouth once daily 90 tablet 0   venlafaxine  XR (EFFEXOR -XR) 75 MG 24 hr capsule TAKE 1 CAPSULE BY MOUTH DAILY 100 capsule 2   No current facility-administered medications on file prior to visit.        ROS:  All others reviewed and negative.  Objective        PE:  BP 124/82 (BP Location: Right Arm, Patient Position: Sitting, Cuff Size: Normal)   Pulse 100   Temp 99.1 F (37.3 C) (Oral)   Ht 6' (1.829 m)   Wt 279 lb (126.6 kg)   SpO2 97%   BMI 37.84 kg/m                 Constitutional: Pt appears in NAD               HENT: Head: NCAT.                Right Ear: External ear normal.                 Left Ear: External ear normal.                Eyes: . Pupils are equal, round, and reactive to light. Conjunctivae and EOM are normal               Nose: without d/c or deformity               Neck: Neck supple. Gross normal ROM               Cardiovascular: Normal rate and regular rhythm.                 Pulmonary/Chest: Effort normal and breath sounds without rales or wheezing.                Abd:  Soft,mild ruq tender, ND, + BS, no organomegaly               Neurological: Pt is alert. At  baseline orientation, motor grossly intact               Skin: Skin is warm. No rashes, no other new lesions, LE edema - trace bilateral               Psychiatric: Pt behavior is normal without agitation   Micro: none  Cardiac tracings I have personally  interpreted today:  none  Pertinent Radiological findings (summarize): none   Lab Results  Component Value Date   WBC 7.8 05/19/2024   HGB 15.1 05/19/2024   HCT 45.2 05/19/2024   PLT 219.0 05/19/2024   GLUCOSE 157 (H) 05/19/2024   CHOL 108 01/29/2024   TRIG 127.0 01/29/2024   HDL 37.70 (L) 01/29/2024   LDLDIRECT 72.0 07/17/2022   LDLCALC 45 01/29/2024   ALT 25 05/19/2024   AST 20 05/19/2024   NA 143 05/19/2024   K 3.7 05/19/2024   CL 104 05/19/2024   CREATININE 0.72 05/19/2024   BUN 20 05/19/2024   CO2 29 05/19/2024   TSH 3.52 01/29/2024   PSA 0.10 01/29/2024   INR 1.0 09/13/2023   HGBA1C 8.5 (H) 01/29/2024   MICROALBUR <0.7 01/29/2024   Assessment/Plan:  Leon Nelson is a 68 y.o. White or Caucasian [1] male with  has a past medical history of ABDOMINAL PAIN OTHER SPECIFIED SITE (07/20/2008), CHEST PAIN (02/06/2011), COLONIC POLYPS, HX OF (05/26/2008), DEPRESSION (05/26/2008), DIABETES MELLITUS, TYPE II (05/26/2008), HYPERLIPIDEMIA (05/26/2008), HYPERTENSION (05/26/2008), HYPOTHYROIDISM (05/26/2008), KNEE PAIN, RIGHT, ACUTE (07/01/2008), Personal history of urinary calculi (05/26/2008), Pure hypercholesterolemia (05/19/2024), and UTI'S, HX OF (05/26/2008).  RUQ pain Mild to mod x 1 wk but improved last 2 days, exam benign, for lab today including LFTs , cbc and also RUQ ultrasound  Essential hypertension BP Readings from Last 3 Encounters:  05/19/24 124/82  05/06/24 114/72  04/17/24 124/78   Stable, pt to continue medical treatment tenoretic  20- 25 mg, lisinopril  20 qd   Vitamin D  deficiency Last vitamin D  Lab Results  Component Value Date   VD25OH 67.31 01/29/2024   Stable, cont oral replacement   B12 deficiency Lab Results  Component Value Date   VITAMINB12 645 01/29/2024   Stable, cont oral replacement - b12 1000 mcg qd   Diabetes mellitus type 2 with neurological manifestations (HCC) Lab Results  Component Value Date   HGBA1C 8.5 (H) 01/29/2024    uncontrolled, pt to continue current medical treatment farxiga  10 mg , glipizide  xl 10 mg every day, metformin  ER 500 mg - 2 bid, and ozemipc 0.5 mg weekly decliens other change today  Followup: Return if symptoms worsen or fail to improve.  Rosalia Colonel, MD 05/19/2024 8:09 PM [facility] Internal Medicine

## 2024-05-19 NOTE — Assessment & Plan Note (Signed)
 Mild to mod x 1 wk but improved last 2 days, exam benign, for lab today including LFTs , cbc and also RUQ ultrasound

## 2024-05-19 NOTE — Assessment & Plan Note (Signed)
 BP Readings from Last 3 Encounters:  05/19/24 124/82  05/06/24 114/72  04/17/24 124/78   Stable, pt to continue medical treatment tenoretic  20- 25 mg, lisinopril  20 qd

## 2024-05-19 NOTE — Progress Notes (Signed)
 The test results show that your current treatment is OK, as the tests are stable.  Please continue the same plan.  There is no other need for change of treatment or further evaluation based on these results, at this time.  thanks

## 2024-05-21 ENCOUNTER — Ambulatory Visit: Admitting: Podiatry

## 2024-05-21 DIAGNOSIS — M79675 Pain in left toe(s): Secondary | ICD-10-CM | POA: Diagnosis not present

## 2024-05-21 DIAGNOSIS — E1149 Type 2 diabetes mellitus with other diabetic neurological complication: Secondary | ICD-10-CM

## 2024-05-21 DIAGNOSIS — M79674 Pain in right toe(s): Secondary | ICD-10-CM

## 2024-05-21 DIAGNOSIS — Q828 Other specified congenital malformations of skin: Secondary | ICD-10-CM

## 2024-05-21 DIAGNOSIS — B351 Tinea unguium: Secondary | ICD-10-CM

## 2024-05-21 NOTE — Progress Notes (Signed)
 Subjective: Chief Complaint  Patient presents with   Nail Problem    RM 13 Patient is here for toe nail trimming and bilateral calluses.     68 year old male presents the office today for diabetic foot evaluation and for calluses to both of his great toes as well as the toenails he cannot trim himself.  He states his sugar runs between 80-300. States the neuropathy is about the same.   Last A1c was 8.5 on 02/18/2024 Roslyn Coombe, MD Last seen 01/29/2024  Objective: AAO x3, NAD DP/PT pulses palpable bilaterally, CRT less than 3 seconds Sensation decreased with SWMF.  Nails are hypertrophic, dystrophic, brittle, discolored, elongated 10. No surrounding redness or drainage. Tenderness nails 1-5 bilaterally.   Hyperkeratotic lesions to bilateral hallux and left submetatarsal 2.  Upon debridement no underlying ulceration, drainage or any signs of infection.  Flatfoot present bilateral.  No other open lesions No pain with calf compression, swelling, warmth, erythema  Assessment: 67 year old male with symptomatic onychomycosis, hyperkeratotic lesions; neuropathy  Plan: -All treatment options discussed with the patient including all alternatives, risks, complications.  -Nails debrided x10 without any complications or bleeding -Hyperkeratotic lesion sharply treated x 3 without complications -Continue gabapentin  - continue to monitor  -Discussed daily foot inspection, glucose control  Return in about 3 months (around 08/21/2024).  Charity Conch DPM

## 2024-05-26 ENCOUNTER — Ambulatory Visit
Admission: RE | Admit: 2024-05-26 | Discharge: 2024-05-26 | Disposition: A | Discharge status: Home / Self Care | Source: Ambulatory Visit | Attending: Internal Medicine | Admitting: Internal Medicine

## 2024-05-26 ENCOUNTER — Ambulatory Visit: Payer: Self-pay | Admitting: Internal Medicine

## 2024-05-26 ENCOUNTER — Other Ambulatory Visit: Payer: Self-pay | Admitting: Internal Medicine

## 2024-05-26 ENCOUNTER — Ambulatory Visit (INDEPENDENT_AMBULATORY_CARE_PROVIDER_SITE_OTHER)

## 2024-05-26 VITALS — BP 122/80 | HR 73 | Ht 72.0 in | Wt 281.4 lb

## 2024-05-26 DIAGNOSIS — R1011 Right upper quadrant pain: Secondary | ICD-10-CM

## 2024-05-26 DIAGNOSIS — I10 Essential (primary) hypertension: Secondary | ICD-10-CM

## 2024-05-26 DIAGNOSIS — E1149 Type 2 diabetes mellitus with other diabetic neurological complication: Secondary | ICD-10-CM

## 2024-05-26 DIAGNOSIS — E559 Vitamin D deficiency, unspecified: Secondary | ICD-10-CM

## 2024-05-26 DIAGNOSIS — Z0000 Encounter for general adult medical examination without abnormal findings: Secondary | ICD-10-CM | POA: Diagnosis not present

## 2024-05-26 DIAGNOSIS — E538 Deficiency of other specified B group vitamins: Secondary | ICD-10-CM

## 2024-05-26 NOTE — Patient Instructions (Signed)
 Leon Nelson , Thank you for taking time out of your busy schedule to complete your Annual Wellness Visit with me. I enjoyed our conversation and look forward to speaking with you again next year. I, as well as your care team,  appreciate your ongoing commitment to your health goals. Please review the following plan we discussed and let me know if I can assist you in the future. Your Game plan/ To Do List    Follow up Visits: Next Medicare AWV with our clinical staff: 06/02/2025   Have you seen your provider in the last 6 months (3 months if uncontrolled diabetes)? Yes Next Office Visit with your provider: 07/29/2024  Clinician Recommendations:  Aim for 30 minutes of exercise or brisk walking, 6-8 glasses of water, and 5 servings of fruits and vegetables each day.       This is a list of the screening recommended for you and due dates:  Health Maintenance  Topic Date Due   Flu Shot  07/24/2024   Hemoglobin A1C  07/28/2024   Yearly kidney health urinalysis for diabetes  01/28/2025   Complete foot exam   01/28/2025   Eye exam for diabetics  02/04/2025   Yearly kidney function blood test for diabetes  05/19/2025   Medicare Annual Wellness Visit  05/26/2025   Colon Cancer Screening  11/01/2027   DTaP/Tdap/Td vaccine (3 - Td or Tdap) 11/05/2029   Pneumonia Vaccine  Completed   Hepatitis C Screening  Completed   HPV Vaccine  Aged Out   Meningitis B Vaccine  Aged Out   COVID-19 Vaccine  Discontinued   Zoster (Shingles) Vaccine  Discontinued    Advanced directives: (Declined) Advance directive discussed with you today. Even though you declined this today, please call our office should you change your mind, and we can give you the proper paperwork for you to fill out. Advance Care Planning is important because it:  [x]  Makes sure you receive the medical care that is consistent with your values, goals, and preferences  [x]  It provides guidance to your family and loved ones and reduces their  decisional burden about whether or not they are making the right decisions based on your wishes.  Follow the link provided in your after visit summary or read over the paperwork we have mailed to you to help you started getting your Advance Directives in place. If you need assistance in completing these, please reach out to us  so that we can help you!

## 2024-05-26 NOTE — Progress Notes (Signed)
 Subjective:   Leon Nelson is a 68 y.o. who presents for a Medicare Wellness preventive visit.  As a reminder, Annual Wellness Visits don't include a physical exam, and some assessments may be limited, especially if this visit is performed virtually. We may recommend an in-person follow-up visit with your provider if needed.  Visit Complete: In person  Persons Participating in Visit: Patient.  AWV Questionnaire: Yes: Patient Medicare AWV questionnaire was completed by the patient on 05/22/2024; I have confirmed that all information answered by patient is correct and no changes since this date.  Cardiac Risk Factors include: advanced age (>81men, >36 women);diabetes mellitus;dyslipidemia;hypertension;male gender;obesity (BMI >30kg/m2)     Objective:     Today's Vitals   05/26/24 1130  BP: 122/80  Pulse: 73  SpO2: 95%  Weight: 281 lb 6.4 oz (127.6 kg)  Height: 6' (1.829 m)   Body mass index is 38.16 kg/m.     05/26/2024   11:28 AM 06/17/2023    1:42 PM 01/17/2022   10:23 AM 12/07/2020    9:32 AM 01/08/2019    4:59 AM 01/07/2019    7:18 PM  Advanced Directives  Does Patient Have a Medical Advance Directive? No Yes Yes Yes No No  Type of Advance Directive   Living will;Healthcare Power of Attorney Living will;Healthcare Power of Attorney    Does patient want to make changes to medical advance directive?  Yes (ED - Information included in AVS) No - Patient declined No - Patient declined    Copy of Healthcare Power of Attorney in Chart?   No - copy requested No - copy requested    Would patient like information on creating a medical advance directive? No - Patient declined    No - Patient declined No - Patient declined    Current Medications (verified) Outpatient Encounter Medications as of 05/26/2024  Medication Sig   albuterol  (VENTOLIN  HFA) 108 (90 Base) MCG/ACT inhaler Inhale 2 puffs into the lungs every 6 (six) hours as needed for wheezing or shortness of breath.    allopurinol  (ZYLOPRIM ) 300 MG tablet TAKE 1 TABLET BY MOUTH DAILY   aspirin  EC 81 MG tablet Take 1 tablet (81 mg total) by mouth daily. Swallow whole.   atenolol -chlorthalidone  (TENORETIC ) 50-25 MG tablet TAKE ONE-HALF TABLET BY MOUTH  DAILY   Blood Glucose Monitoring Suppl (ONE TOUCH ULTRA 2) w/Device KIT Use as directed once daily E11.9   Continuous Glucose Receiver (FREESTYLE LIBRE 3 READER) DEVI Use as directed four times per day E11.9   Continuous Glucose Sensor (FREESTYLE LIBRE 3 PLUS SENSOR) MISC Change sensor every 15 days. E11.9   dapagliflozin  propanediol (FARXIGA ) 10 MG TABS tablet Take 1 tablet (10 mg total) by mouth daily before breakfast.   diclofenac  sodium (VOLTAREN ) 1 % GEL Apply 2 g topically 4 (four) times daily as needed.   dicyclomine  (BENTYL ) 20 MG tablet TAKE 1 TABLET BY MOUTH 3 TIMES  DAILY AS NEEDED   gabapentin  (NEURONTIN ) 300 MG capsule TAKE 1 CAPSULE BY MOUTH TWICE  DAILY   glipiZIDE  (GLUCOTROL  XL) 10 MG 24 hr tablet TAKE 1 TABLET BY MOUTH DAILY  WITH BREAKFAST   glucose blood (ONE TOUCH ULTRA TEST) test strip Use as instructed once daily E11.9   ketoconazole (NIZORAL) 2 % shampoo Apply 1 Application topically 3 (three) times a week.   Lancets (ONETOUCH DELICA PLUS LANCET30G) MISC USE AS DIRECTED ONCE DAILY   Lancets MISC Use as directed once daily E11.9   levothyroxine  (SYNTHROID ) 50  MCG tablet TAKE 1 TABLET BY MOUTH DAILY   lisinopril  (ZESTRIL ) 20 MG tablet TAKE 1 TABLET BY MOUTH DAILY   metFORMIN  (GLUCOPHAGE -XR) 500 MG 24 hr tablet TAKE 2 TABLETS BY MOUTH TWICE  DAILY WITH A MEAL   potassium chloride  SA (KLOR-CON  M) 20 MEQ tablet TAKE 2 TABLETS BY MOUTH DAILY   rosuvastatin  (CRESTOR ) 40 MG tablet TAKE 1 TABLET BY MOUTH DAILY   Semaglutide ,0.25 or 0.5MG /DOS, 2 MG/3ML SOPN Take 0.5 mg subcutaneous once weekly   solifenacin  (VESICARE ) 5 MG tablet Take 1 tablet by mouth once daily   venlafaxine  XR (EFFEXOR -XR) 75 MG 24 hr capsule TAKE 1 CAPSULE BY MOUTH DAILY    [DISCONTINUED] levofloxacin  (LEVAQUIN ) 500 MG tablet Take 1 tablet (500 mg total) by mouth daily.   No facility-administered encounter medications on file as of 05/26/2024.    Allergies (verified) Penicillins and Sulfonamide derivatives   History: Past Medical History:  Diagnosis Date   ABDOMINAL PAIN OTHER SPECIFIED SITE 07/20/2008   CHEST PAIN 02/06/2011   COLONIC POLYPS, HX OF 05/26/2008   DEPRESSION 05/26/2008   DIABETES MELLITUS, TYPE II 05/26/2008   HYPERLIPIDEMIA 05/26/2008   HYPERTENSION 05/26/2008   HYPOTHYROIDISM 05/26/2008   KNEE PAIN, RIGHT, ACUTE 07/01/2008   Personal history of urinary calculi 05/26/2008   Pure hypercholesterolemia 05/19/2024   UTI'S, HX OF 05/26/2008   Past Surgical History:  Procedure Laterality Date   s/p right knee arthroplasty      complicated by patellar tendon rupture Jan 2011 Dr. Dante Dyer   TONSILLECTOMY     Family History  Problem Relation Age of Onset   Cancer Mother        breast cancer   Hyperlipidemia Mother    Heart disease Mother    Hyperlipidemia Father    Heart disease Father    Alcohol abuse Other    Diabetes Other    Social History   Socioeconomic History   Marital status: Married    Spouse name: Not on file   Number of children: Not on file   Years of education: Not on file   Highest education level: 12th grade  Occupational History   Not on file  Tobacco Use   Smoking status: Never   Smokeless tobacco: Never  Substance and Sexual Activity   Alcohol use: No   Drug use: No   Sexual activity: Yes  Other Topics Concern   Not on file  Social History Narrative   married   Social Drivers of Corporate investment banker Strain: Low Risk  (05/26/2024)   Overall Financial Resource Strain (CARDIA)    Difficulty of Paying Living Expenses: Not hard at all  Food Insecurity: No Food Insecurity (05/26/2024)   Hunger Vital Sign    Worried About Running Out of Food in the Last Year: Never true    Ran Out of Food in the  Last Year: Never true  Transportation Needs: No Transportation Needs (05/26/2024)   PRAPARE - Administrator, Civil Service (Medical): No    Lack of Transportation (Non-Medical): No  Physical Activity: Insufficiently Active (05/26/2024)   Exercise Vital Sign    Days of Exercise per Week: 1 day    Minutes of Exercise per Session: 20 min  Stress: No Stress Concern Present (05/26/2024)   [employer] of Occupational Health - Occupational Stress Questionnaire    Feeling of Stress : Not at all  Social Connections: Socially Integrated (05/26/2024)   Social Connection and Isolation Panel [NHANES]  Frequency of Communication with Friends and Family: More than three times a week    Frequency of Social Gatherings with Friends and Family: Once a week    Attends Religious Services: More than 4 times per year    Active Member of [employer] or Organizations: Yes    Attends Engineer, structural: More than 4 times per year    Marital Status: Married    Tobacco Counseling Counseling given: No    Clinical Intake:  Pre-visit preparation completed: Yes  Pain : No/denies pain     BMI - recorded: 38.16 Nutritional Status: BMI > 30  Obese Nutritional Risks: None Diabetes: Yes CBG done?: Yes CBG resulted in Enter/ Edit results?: Yes (fasting - 141) Did pt. bring in CBG monitor from home?: No  Lab Results  Component Value Date   HGBA1C 8.5 (H) 01/29/2024   HGBA1C 8.5 (H) 07/24/2023   HGBA1C 9.1 (H) 01/23/2023     How often do you need to have someone help you when you read instructions, pamphlets, or other written materials from your doctor or pharmacy?: 1 - Never  Interpreter Needed?: No  Information entered by :: Kandy Orris, CMA   Activities of Daily Living     05/26/2024   11:32 AM 05/22/2024    4:56 PM  In your present state of health, do you have any difficulty performing the following activities:  Hearing? 0 0  Vision? 0 0  Difficulty concentrating or  making decisions? 0 0  Walking or climbing stairs? 1 1  Dressing or bathing? 1 1  Doing errands, shopping? 0 0  Preparing Food and eating ? N N  Using the Toilet? N N  In the past six months, have you accidently leaked urine? Y Y  Comment seldom   Do you have problems with loss of bowel control? N N  Managing your Medications? N N  Managing your Finances? N N  Housekeeping or managing your Housekeeping? N N    Patient Care Team: Roslyn Coombe, MD as PCP - General Clydia Dart Archer Kobs, DPM as Consulting Physician (Podiatry)  I have updated your Care Teams any recent Medical Services you may have received from other providers in the past year.     Assessment:    This is a routine wellness examination for Leon Nelson.  Hearing/Vision screen Hearing Screening - Comments:: Denies hearing difficulties   Vision Screening - Comments:: Wears rx glasses - up to date with routine eye exams with [facility]   Goals Addressed               This Visit's Progress     Patient Stated (pt-stated)        Patient stated he will continue monitoring his blood sugar readings       Depression Screen     05/26/2024   11:33 AM 05/19/2024    2:02 PM 04/17/2024    3:26 PM 04/07/2024    1:29 PM 01/29/2024    8:07 AM 06/17/2023    1:41 PM 01/23/2023    8:18 AM  PHQ 2/9 Scores  PHQ - 2 Score 0 0 0 0 0 0 0  PHQ- 9 Score 0 0    0     Fall Risk     05/26/2024   11:38 AM 05/22/2024    4:56 PM 05/19/2024    2:07 PM 04/17/2024    3:35 PM 04/07/2024    1:36 PM  Fall Risk   Falls in the  past year? 0 1 0 0 0  Comment confirmed w/pt      Number falls in past yr: 0 0 0 0 0  Injury with Fall? 0 0 0 0 0  Risk for fall due to : No Fall Risks  No Fall Risks No Fall Risks No Fall Risks  Follow up Falls evaluation completed;Falls prevention discussed  Falls evaluation completed Falls evaluation completed Falls evaluation completed    MEDICARE RISK AT HOME:  Medicare Risk at Home Any stairs in or around the  home?: Yes If so, are there any without handrails?: Yes Home free of loose throw rugs in walkways, pet beds, electrical cords, etc?: Yes Adequate lighting in your home to reduce risk of falls?: Yes Life alert?: No Use of a cane, walker or w/c?: Yes (cane) Grab bars in the bathroom?: No Shower chair or bench in shower?: No Elevated toilet seat or a handicapped toilet?: Yes  TIMED UP AND GO:  Was the test performed?  No  Cognitive Function: 6CIT completed        05/26/2024   11:39 AM 06/17/2023    1:44 PM  6CIT Screen  What Year? 0 points 0 points  What month? 0 points   What time? 0 points 0 points  Count back from 20 0 points 0 points  Months in reverse 0 points 0 points  Repeat phrase 0 points 4 points  Total Score 0 points     Immunizations Immunization History  Administered Date(s) Administered   Fluad Quad(high Dose 65+) 12/04/2020, 01/17/2022, 01/23/2023   Fluad Trivalent(High Dose 65+) 09/13/2023   H1N1 01/24/2009   Influenza Split 09/21/2011   Influenza Whole 09/23/2008, 09/23/2009   Influenza, Seasonal, Injecte, Preservative Fre 02/11/2013   Influenza,inj,Quad PF,6+ Mos 01/20/2014, 09/16/2015, 11/06/2019   Influenza-Unspecified 09/22/2016, 10/20/2018   PFIZER(Purple Top)SARS-COV-2 Vaccination 01/25/2020, 02/15/2020, 12/04/2020   PPD Test 02/02/2019   Pneumococcal Conjugate-13 11/06/2019   Pneumococcal Polysaccharide-23 12/24/2005, 07/21/2014, 04/24/2021   Td 01/24/2009   Tdap 11/06/2019    Screening Tests Health Maintenance  Topic Date Due   INFLUENZA VACCINE  07/24/2024   HEMOGLOBIN A1C  07/28/2024   Diabetic kidney evaluation - Urine ACR  01/28/2025   FOOT EXAM  01/28/2025   OPHTHALMOLOGY EXAM  02/04/2025   Diabetic kidney evaluation - eGFR measurement  05/19/2025   Medicare Annual Wellness (AWV)  05/26/2025   Colonoscopy  11/01/2027   DTaP/Tdap/Td (3 - Td or Tdap) 11/05/2029   Pneumonia Vaccine 10+ Years old  Completed   Hepatitis C Screening   Completed   HPV VACCINES  Aged Out   Meningococcal B Vaccine  Aged Out   COVID-19 Vaccine  Discontinued   Zoster Vaccines- Shingrix  Discontinued    Health Maintenance  There are no preventive care reminders to display for this patient. Health Maintenance Items Addressed:  05/26/2024  Foot Exam status:seen Dr Clydia Dart in 05/2024   Additional Screening:  Vision Screening: Recommended annual ophthalmology exams for early detection of glaucoma and other disorders of the eye. Pt has an appt w/ [facility] in 2025.   Dental Screening: Recommended annual dental exams for proper oral hygiene  Community Resource Referral / Chronic Care Management: CRR required this visit?  No   CCM required this visit?  No   Plan:    I have personally reviewed and noted the following in the patient's chart:   Medical and social history Use of alcohol, tobacco or illicit drugs  Current medications and supplements including opioid  prescriptions. Patient is not currently taking opioid prescriptions. Functional ability and status Nutritional status Physical activity Advanced directives List of other physicians Hospitalizations, surgeries, and ER visits in previous 12 months Vitals Screenings to include cognitive, depression, and falls Referrals and appointments  In addition, I have reviewed and discussed with patient certain preventive protocols, quality metrics, and best practice recommendations. A written personalized care plan for preventive services as well as general preventive health recommendations were provided to patient.   Patria Bookbinder, CMA   05/26/2024   After Visit Summary: (In Person-Declined) Patient declined AVS at this time.  Notes: Nothing significant to report at this time.

## 2024-06-24 ENCOUNTER — Telehealth: Payer: Self-pay

## 2024-06-24 NOTE — Telephone Encounter (Signed)
 Called and let Pt know his Ozempic  has arrived in the office and is ready for pick up, it has been placed in the nurse station fridge.

## 2024-06-30 ENCOUNTER — Other Ambulatory Visit: Payer: Self-pay | Admitting: Internal Medicine

## 2024-06-30 MED ORDER — ACCU-CHEK GUIDE TEST VI STRP: ORAL_STRIP | 12 refills | Status: DC

## 2024-06-30 MED ORDER — ACCU-CHEK GUIDE ME W/DEVICE KIT: PACK | 0 refills | Status: DC

## 2024-06-30 MED ORDER — ACCU-CHEK GUIDE CONTROL VI LIQD: 1 refills | Status: DC

## 2024-06-30 NOTE — Telephone Encounter (Signed)
 Ok to let pt know -   His insurance prefers the accucheck guide me system -   So I have sent orders for the kit and strips and control solution

## 2024-07-03 ENCOUNTER — Encounter (HOSPITAL_COMMUNITY): Payer: Self-pay

## 2024-07-07 ENCOUNTER — Ambulatory Visit (HOSPITAL_COMMUNITY)
Admission: RE | Admit: 2024-07-07 | Discharge: 2024-07-07 | Disposition: A | Discharge status: Home / Self Care | Source: Ambulatory Visit | Attending: Nurse Practitioner | Admitting: Nurse Practitioner

## 2024-07-07 DIAGNOSIS — R931 Abnormal findings on diagnostic imaging of heart and coronary circulation: Secondary | ICD-10-CM | POA: Diagnosis not present

## 2024-07-07 DIAGNOSIS — I2583 Coronary atherosclerosis due to lipid rich plaque: Secondary | ICD-10-CM | POA: Diagnosis not present

## 2024-07-07 DIAGNOSIS — I2510 Atherosclerotic heart disease of native coronary artery without angina pectoris: Secondary | ICD-10-CM | POA: Insufficient documentation

## 2024-07-07 DIAGNOSIS — I10 Essential (primary) hypertension: Secondary | ICD-10-CM | POA: Insufficient documentation

## 2024-07-07 LAB — NM PET CT CARDIAC PERFUSION MULTI W/ABSOLUTE BLOODFLOW
LV dias vol: 111 mL (ref 62–150)
LV sys vol: 59 mL (ref 4.2–5.8)
MBFR: 1.74
Nuc Rest EF: 47 %
Nuc Stress EF: 54 %
Rest MBF: 1.06 ml/g/min
Rest Nuclear Isotope Dose: 30.1 mCi
ST Depression (mm): 0 mm
Stress MBF: 1.84 ml/g/min
Stress Nuclear Isotope Dose: 29.8 mCi

## 2024-07-07 MED ORDER — REGADENOSON 0.4 MG/5ML IV SOLN
0.4000 mg | Freq: Once | INTRAVENOUS | Status: AC
  Administered 2024-07-07: 0.4 mg via INTRAVENOUS

## 2024-07-07 MED ORDER — RUBIDIUM RB82 GENERATOR (RUBYFILL)
30.1000 | PACK | Freq: Once | INTRAVENOUS | Status: AC
  Administered 2024-07-07: 30.1 via INTRAVENOUS

## 2024-07-07 MED ORDER — RUBIDIUM RB82 GENERATOR (RUBYFILL)
29.7600 | PACK | Freq: Once | INTRAVENOUS | Status: AC
  Administered 2024-07-07: 29.76 via INTRAVENOUS

## 2024-07-07 MED ORDER — REGADENOSON 0.4 MG/5ML IV SOLN
INTRAVENOUS | Status: AC
  Filled 2024-07-07: qty 5

## 2024-07-08 ENCOUNTER — Ambulatory Visit: Payer: Self-pay | Admitting: Nurse Practitioner

## 2024-07-16 ENCOUNTER — Other Ambulatory Visit: Payer: Self-pay | Admitting: Internal Medicine

## 2024-07-16 MED ORDER — LANCETS MISC: 3 refills | Status: DC

## 2024-07-16 NOTE — Telephone Encounter (Signed)
 Copied from CRM [id number]. Topic: Clinical - Medication Refill >> Jul 16, 2024  1:45 PM Paige D wrote: Medication: Lancets   Has the patient contacted their pharmacy? Yes (Agent: If no, request that the patient contact the pharmacy for the refill. If patient does not wish to contact the pharmacy document the reason why and proceed with request.) (Agent: If yes, when and what did the pharmacy advise?)  This is the patient's preferred pharmacy:   [facility] [address], [place] - [address] [place] [address] Phone: [phone] Fax: [phone]   Is this the correct pharmacy for this prescription? Yes If no, delete pharmacy and type the correct one.   Has the prescription been filled recently? Yes  Is the patient out of the medication? Yes  Has the patient been seen for an appointment in the last year OR does the patient have an upcoming appointment? Yes  Can we respond through MyChart? Yes  Agent: Please be advised that Rx refills may take up to 3 business days. We ask that you follow-up with your pharmacy.

## 2024-07-19 ENCOUNTER — Other Ambulatory Visit: Payer: Self-pay | Admitting: Internal Medicine

## 2024-07-29 ENCOUNTER — Ambulatory Visit: Payer: Self-pay | Admitting: Internal Medicine

## 2024-07-29 ENCOUNTER — Ambulatory Visit (INDEPENDENT_AMBULATORY_CARE_PROVIDER_SITE_OTHER): Payer: Medicare Other | Admitting: Internal Medicine

## 2024-07-29 ENCOUNTER — Encounter: Payer: Self-pay | Admitting: Internal Medicine

## 2024-07-29 ENCOUNTER — Telehealth: Payer: Self-pay | Admitting: Internal Medicine

## 2024-07-29 ENCOUNTER — Other Ambulatory Visit: Payer: Self-pay | Admitting: Internal Medicine

## 2024-07-29 VITALS — BP 108/70 | HR 59 | Temp 98.6°F | Ht 72.0 in | Wt 279.4 lb

## 2024-07-29 DIAGNOSIS — E559 Vitamin D deficiency, unspecified: Secondary | ICD-10-CM | POA: Diagnosis not present

## 2024-07-29 DIAGNOSIS — E538 Deficiency of other specified B group vitamins: Secondary | ICD-10-CM

## 2024-07-29 DIAGNOSIS — Z7984 Long term (current) use of oral hypoglycemic drugs: Secondary | ICD-10-CM | POA: Diagnosis not present

## 2024-07-29 DIAGNOSIS — E782 Mixed hyperlipidemia: Secondary | ICD-10-CM | POA: Diagnosis not present

## 2024-07-29 DIAGNOSIS — R0609 Other forms of dyspnea: Secondary | ICD-10-CM

## 2024-07-29 DIAGNOSIS — E1149 Type 2 diabetes mellitus with other diabetic neurological complication: Secondary | ICD-10-CM | POA: Diagnosis not present

## 2024-07-29 DIAGNOSIS — E039 Hypothyroidism, unspecified: Secondary | ICD-10-CM

## 2024-07-29 DIAGNOSIS — K760 Fatty (change of) liver, not elsewhere classified: Secondary | ICD-10-CM | POA: Diagnosis not present

## 2024-07-29 DIAGNOSIS — I10 Essential (primary) hypertension: Secondary | ICD-10-CM | POA: Diagnosis not present

## 2024-07-29 LAB — HEPATIC FUNCTION PANEL
ALT: 27 U/L (ref 0–53)
AST: 20 U/L (ref 0–37)
Albumin: 4.3 g/dL (ref 3.5–5.2)
Alkaline Phosphatase: 41 U/L (ref 39–117)
Bilirubin, Direct: 0.1 mg/dL (ref 0.0–0.3)
Total Bilirubin: 0.4 mg/dL (ref 0.2–1.2)
Total Protein: 7 g/dL (ref 6.0–8.3)

## 2024-07-29 LAB — LIPID PANEL
Cholesterol: 105 mg/dL (ref 0–200)
HDL: 40.9 mg/dL (ref >39.00)
LDL Cholesterol: 48 mg/dL (ref 0–99)
NonHDL: 64.37
Total CHOL/HDL Ratio: 3
Triglycerides: 83 mg/dL (ref 0.0–149.0)
VLDL: 16.6 mg/dL (ref 0.0–40.0)

## 2024-07-29 LAB — BASIC METABOLIC PANEL WITH GFR
BUN: 22 mg/dL (ref 6–23)
CO2: 31 meq/L (ref 19–32)
Calcium: 9.5 mg/dL (ref 8.4–10.5)
Chloride: 104 meq/L (ref 96–112)
Creatinine, Ser: 0.87 mg/dL (ref 0.40–1.50)
GFR: 87.96 mL/min (ref >60.00)
Glucose, Bld: 169 mg/dL — ABNORMAL HIGH (ref 70–99)
Potassium: 4.6 meq/L (ref 3.5–5.1)
Sodium: 142 meq/L (ref 135–145)

## 2024-07-29 LAB — HEMOGLOBIN A1C: Hgb A1c MFr Bld: 7.3 % — ABNORMAL HIGH (ref 4.6–6.5)

## 2024-07-29 LAB — VITAMIN B12: Vitamin B-12: 493 pg/mL (ref 211–911)

## 2024-07-29 LAB — VITAMIN D 25 HYDROXY (VIT D DEFICIENCY, FRACTURES): VITD: 72.07 ng/mL (ref 30.00–100.00)

## 2024-07-29 MED ORDER — DAPAGLIFLOZIN PROPANEDIOL 10 MG PO TABS: 10.0000 mg | ORAL_TABLET | Freq: Every day | ORAL | 3 refills | Status: AC

## 2024-07-29 MED ORDER — SEMAGLUTIDE (1 MG/DOSE) 4 MG/3ML ~~LOC~~ SOPN: 1.0000 mg | PEN_INJECTOR | SUBCUTANEOUS | 3 refills | Status: AC

## 2024-07-29 NOTE — Progress Notes (Unsigned)
 Patient ID: Leon Nelson, male   DOB: 05/01/1956, 68 y.o.   MRN: [id number]        Chief Complaint: follow up HTN, HLD., obesity, DOE, fatty liver, depression       HPI:  Leon Nelson is a 68 y.o. male here overall doing ok,  Pt denies chest pain, increased sob, wheezing, orthopnea, PND, increased LE swelling, but still unexplained DOE,  No palpitations, dizziness or syncope.   Pt denies polydipsia, polyuria, or new focal neuro s/s.    Pt denies fever, wt loss, night sweats, loss of appetite, or other constitutional symptoms   Denies worsening depressive symptoms, suicidal ideation, or panic; much improved after required time off work earlier this year due to stressors and depression.         Wt Readings from Last 3 Encounters:  07/29/24 279 lb 6.4 oz (126.7 kg)  05/26/24 281 lb 6.4 oz (127.6 kg)  05/19/24 279 lb (126.6 kg)   BP Readings from Last 3 Encounters:  07/29/24 108/70  07/07/24 115/72  05/26/24 122/80         Past Medical History:  Diagnosis Date   ABDOMINAL PAIN OTHER SPECIFIED SITE 07/20/2008   CHEST PAIN 02/06/2011   COLONIC POLYPS, HX OF 05/26/2008   DEPRESSION 05/26/2008   DIABETES MELLITUS, TYPE II 05/26/2008   HYPERLIPIDEMIA 05/26/2008   HYPERTENSION 05/26/2008   HYPOTHYROIDISM 05/26/2008   KNEE PAIN, RIGHT, ACUTE 07/01/2008   Personal history of urinary calculi 05/26/2008   Pure hypercholesterolemia 05/19/2024   UTI'S, HX OF 05/26/2008   Past Surgical History:  Procedure Laterality Date   s/p right knee arthroplasty      complicated by patellar tendon rupture Jan 2011 Dr. heide   TONSILLECTOMY      reports that he has never smoked. He has never used smokeless tobacco. He reports that he does not drink alcohol and does not use drugs. family history includes Alcohol abuse in an other family member; Cancer in his mother; Diabetes in an other family member; Heart disease in his father and mother; Hyperlipidemia in his father and mother. Allergies   Allergen Reactions   Penicillins Other (See Comments)    Blisters on hands   Sulfonamide Derivatives Other (See Comments)    Blisters in groin area   Current Outpatient Medications on File Prior to Visit  Medication Sig Dispense Refill   Accu-Chek Softclix Lancets lancets USE AS DIRECTED TWICE DAILY 100 each 3   albuterol  (VENTOLIN  HFA) 108 (90 Base) MCG/ACT inhaler Inhale 2 puffs into the lungs every 6 (six) hours as needed for wheezing or shortness of breath. 8 g 11   allopurinol  (ZYLOPRIM ) 300 MG tablet TAKE 1 TABLET BY MOUTH DAILY 100 tablet 2   aspirin  EC 81 MG tablet Take 1 tablet (81 mg total) by mouth daily. Swallow whole.     atenolol -chlorthalidone  (TENORETIC ) 50-25 MG tablet TAKE ONE-HALF TABLET BY MOUTH  DAILY 50 tablet 2   Blood Glucose Calibration (ACCU-CHEK GUIDE CONTROL) LIQD Use as directed once daily as needed E11.9 1 each 1   Blood Glucose Monitoring Suppl (ACCU-CHEK GUIDE ME) w/Device KIT Use as directed up to four times per day E11.9 1 kit 0   Continuous Glucose Receiver (FREESTYLE LIBRE 3 READER) DEVI Use as directed four times per day E11.9 1 each 0   Continuous Glucose Sensor (FREESTYLE LIBRE 3 PLUS SENSOR) MISC Change sensor every 15 days. E11.9 6 each 3   diclofenac  sodium (VOLTAREN ) 1 % GEL Apply 2  g topically 4 (four) times daily as needed. 200 g 5   dicyclomine  (BENTYL ) 20 MG tablet TAKE 1 TABLET BY MOUTH 3 TIMES  DAILY AS NEEDED 300 tablet 3   gabapentin  (NEURONTIN ) 300 MG capsule TAKE 1 CAPSULE BY MOUTH TWICE  DAILY 200 capsule 2   glipiZIDE  (GLUCOTROL  XL) 10 MG 24 hr tablet TAKE 1 TABLET BY MOUTH DAILY  WITH BREAKFAST 100 tablet 2   glucose blood (ACCU-CHEK GUIDE TEST) test strip Use as instructed twice per day E11.9 200 each 12   ketoconazole (NIZORAL) 2 % shampoo Apply 1 Application topically 3 (three) times a week.     levothyroxine  (SYNTHROID ) 50 MCG tablet TAKE 1 TABLET BY MOUTH DAILY 60 tablet 5   lisinopril  (ZESTRIL ) 20 MG tablet TAKE 1 TABLET BY MOUTH  DAILY 100 tablet 2   metFORMIN  (GLUCOPHAGE -XR) 500 MG 24 hr tablet TAKE 2 TABLETS BY MOUTH TWICE  DAILY WITH A MEAL 400 tablet 2   potassium chloride  SA (KLOR-CON  M) 20 MEQ tablet TAKE 2 TABLETS BY MOUTH DAILY 200 tablet 2   rosuvastatin  (CRESTOR ) 40 MG tablet TAKE 1 TABLET BY MOUTH DAILY 90 tablet 3   solifenacin  (VESICARE ) 5 MG tablet Take 1 tablet by mouth once daily 90 tablet 0   venlafaxine  XR (EFFEXOR -XR) 75 MG 24 hr capsule TAKE 1 CAPSULE BY MOUTH DAILY 100 capsule 2   No current facility-administered medications on file prior to visit.        ROS:  All others reviewed and negative.  Objective        PE:  BP 108/70   Pulse (!) 59   Temp 98.6 F (37 C)   Ht 6' (1.829 m)   Wt 279 lb 6.4 oz (126.7 kg)   SpO2 95%   BMI 37.89 kg/m                 Constitutional: Pt appears in NAD               HENT: Head: NCAT.                Right Ear: External ear normal.                 Left Ear: External ear normal.                Eyes: . Pupils are equal, round, and reactive to light. Conjunctivae and EOM are normal               Nose: without d/c or deformity               Neck: Neck supple. Gross normal ROM               Cardiovascular: Normal rate and regular rhythm.                 Pulmonary/Chest: Effort normal and breath sounds without rales or wheezing.                Abd:  Soft, NT, ND, + BS, no organomegaly               Neurological: Pt is alert. At baseline orientation, motor grossly intact               Skin: Skin is warm. No rashes, no other new lesions, LE edema - none               Psychiatric: Pt behavior is normal without  agitation   Micro: none  Cardiac tracings I have personally interpreted today:  none  Pertinent Radiological findings (summarize): none   Lab Results  Component Value Date   WBC 7.8 05/19/2024   HGB 15.1 05/19/2024   HCT 45.2 05/19/2024   PLT 219.0 05/19/2024   GLUCOSE 169 (H) 07/29/2024   CHOL 105 07/29/2024   TRIG 83.0 07/29/2024   HDL  40.90 07/29/2024   LDLDIRECT 72.0 07/17/2022   LDLCALC 48 07/29/2024   ALT 27 07/29/2024   AST 20 07/29/2024   NA 142 07/29/2024   K 4.6 07/29/2024   CL 104 07/29/2024   CREATININE 0.87 07/29/2024   BUN 22 07/29/2024   CO2 31 07/29/2024   TSH 3.52 01/29/2024   PSA 0.10 01/29/2024   INR 1.0 09/13/2023   HGBA1C 7.3 (H) 07/29/2024   MICROALBUR 0.7 02/17/2009   Assessment/Plan:  Leon Nelson is a 68 y.o. White or Caucasian [1] male with  has a past medical history of ABDOMINAL PAIN OTHER SPECIFIED SITE (07/20/2008), CHEST PAIN (02/06/2011), COLONIC POLYPS, HX OF (05/26/2008), DEPRESSION (05/26/2008), DIABETES MELLITUS, TYPE II (05/26/2008), HYPERLIPIDEMIA (05/26/2008), HYPERTENSION (05/26/2008), HYPOTHYROIDISM (05/26/2008), KNEE PAIN, RIGHT, ACUTE (07/01/2008), Personal history of urinary calculi (05/26/2008), Pure hypercholesterolemia (05/19/2024), and UTI'S, HX OF (05/26/2008).  Fatty liver Noted on recent imaging, for low fat low chol diet, and wt loss  Vitamin D  deficiency Last vitamin D  Lab Results  Component Value Date   VD25OH 72.07 07/29/2024   Stable, cont oral replacement   Hypothyroidism Lab Results  Component Value Date   TSH 3.52 01/29/2024   Stable, pt to continue levothyroxine  50 mcg qd   Hyperlipidemia Lab Results  Component Value Date   LDLCALC 48 07/29/2024   Stable, pt to continue current statin crestor  40 mg qd   Essential hypertension BP Readings from Last 3 Encounters:  07/29/24 108/70  07/07/24 115/72  05/26/24 122/80   Stable, pt to continue medical treatment tenoretic  50 25 mg every day, lisinopril  20 qd   Diabetes mellitus type 2 with neurological manifestations (HCC) Lab Results  Component Value Date   HGBA1C 7.3 (H) 07/29/2024   Mild uncontrolled, pt to continue current medical treatment glucotrol  xl 10 every day, metformin  ER 500 mg- 2 bid, farxiga  10 every day, and increased ozempic  to 1 mg weekly   B12 deficiency Lab  Results  Component Value Date   VITAMINB12 493 07/29/2024   Stable, cont oral replacement - b12 1000 mcg qd   DOE (dyspnea on exertion) Etiology unclear, also for pulm referral  Followup: Return in about 6 months (around 01/29/2025).  Lynwood Rush, MD 07/30/2024 1:10 PM [facility]

## 2024-07-29 NOTE — Patient Instructions (Signed)
 Ok to increase the ozempic  to 1 mg weekly  Please continue all other medications as before, and refills have been done if requested.  Please have the pharmacy call with any other refills you may need.  Please continue your efforts at being more active, low cholesterol diet, and weight control.  Please keep your appointments with your specialists as you may have planned - cardiology  You will be contacted regarding the referral for: Pulmonary  Please make an Appointment to return in 6 months, or sooner if needed

## 2024-07-29 NOTE — Telephone Encounter (Signed)
 Copied from CRM #[id number]. Topic: Clinical - Medication Refill >> Jul 29, 2024 11:56 AM Burnard DEL wrote: Medication: dapagliflozin  propanediol (FARXIGA ) 10 MG TABS tablet  Has the patient contacted their pharmacy? Yes (Agent: If no, request that the patient contact the pharmacy for the refill. If patient does not wish to contact the pharmacy document the reason why and proceed with request.) (Agent: If yes, when and what did the pharmacy advise?)  This is the patient's preferred pharmacy:  [facility]  [phone] Fax#:[phone] Email [email or web address]    Is this the correct pharmacy for this prescription? Yes If no, delete pharmacy and type the correct one.   Has the prescription been filled recently? yes  Is the patient out of the medication? No  Has the patient been seen for an appointment in the last year OR does the patient have an upcoming appointment? Yes  Can we respond through MyChart? Yes  Agent: Please be advised that Rx refills may take up to 3 business days. We ask that you follow-up with your pharmacy.

## 2024-07-29 NOTE — Telephone Encounter (Signed)
 Please do not send prescription to any pharmacy on file.    Patient would like to use [facility], this service needs new prescription to qualify for the program that allows patient to have prescription for reduced cost ($0) Otherwise, medication would cost $900/month.  Patient does not need more farxiga  at this time, but company ([facility]) requires updated prescription for participation  Unable to find pharmacy in Epic despite searching by name or phone numbers

## 2024-07-29 NOTE — Telephone Encounter (Signed)
 Copied from CRM #[id number]. Topic: Clinical - Medication Question >> Jul 29, 2024 12:02 PM Leon Nelson DEL wrote: Reason for CRM: Patient stated that his prescription for his ozempic  is usually through patient assistance and it gets sent to the office and he come to pick it up. Provider sent increased ozempic  dosage to [facility] but they do not cover his insurance and it would need to be sent to the patient assistance where he usually receives it from.

## 2024-07-30 ENCOUNTER — Encounter: Payer: Self-pay | Admitting: Internal Medicine

## 2024-07-30 NOTE — Assessment & Plan Note (Signed)
 Lab Results  Component Value Date   HGBA1C 7.3 (H) 07/29/2024   Mild uncontrolled, pt to continue current medical treatment glucotrol  xl 10 every day, metformin  ER 500 mg- 2 bid, farxiga  10 every day, and increased ozempic  to 1 mg weekly

## 2024-07-30 NOTE — Assessment & Plan Note (Signed)
 Last vitamin D  Lab Results  Component Value Date   VD25OH 72.07 07/29/2024   Stable, cont oral replacement

## 2024-07-30 NOTE — Assessment & Plan Note (Signed)
 Noted on recent imaging, for low fat low chol diet, and wt loss

## 2024-07-30 NOTE — Assessment & Plan Note (Signed)
 Lab Results  Component Value Date   VITAMINB12 493 07/29/2024   Stable, cont oral replacement - b12 1000 mcg qd

## 2024-07-30 NOTE — Assessment & Plan Note (Signed)
 Etiology unclear, also for pulm referral

## 2024-07-30 NOTE — Assessment & Plan Note (Signed)
 Lab Results  Component Value Date   TSH 3.52 01/29/2024   Stable, pt to continue levothyroxine  50 mcg qd

## 2024-07-30 NOTE — Assessment & Plan Note (Signed)
 Lab Results  Component Value Date   LDLCALC 48 07/29/2024   Stable, pt to continue current statin crestor  40 mg qd

## 2024-07-30 NOTE — Assessment & Plan Note (Signed)
 BP Readings from Last 3 Encounters:  07/29/24 108/70  07/07/24 115/72  05/26/24 122/80   Stable, pt to continue medical treatment tenoretic  50 25 mg every day, lisinopril  20 qd

## 2024-08-03 ENCOUNTER — Other Ambulatory Visit: Payer: Self-pay | Admitting: Internal Medicine

## 2024-08-09 ENCOUNTER — Other Ambulatory Visit: Payer: Self-pay | Admitting: Internal Medicine

## 2024-08-14 ENCOUNTER — Telehealth: Payer: Self-pay

## 2024-08-14 NOTE — Telephone Encounter (Signed)
 Pt picked up Ozempic  and spoke with Suzen Burkitt about how to take it moving forward with the old and new dose.

## 2024-08-14 NOTE — Telephone Encounter (Signed)
 Receieved a package of Ozempic  quantity 4. I called patient and informed him we have his medication here at the office. Patient verbalized thanks and infoirmed me he would try an dbe here today (08/14/2024) but it may be Monday before he can get to it.(08/17/2024) I told patient I will get his medications in a fridge and chart that we spoke to each other.

## 2024-08-21 ENCOUNTER — Ambulatory Visit: Admitting: Podiatry

## 2024-08-21 ENCOUNTER — Ambulatory Visit: Payer: Self-pay

## 2024-08-21 NOTE — Telephone Encounter (Signed)
 FYI Only or Action Required?: FYI only for provider.  Patient was last seen in primary care on 07/29/2024 by Norleen Lynwood ORN, MD.  Called Nurse Triage reporting Numbness.  Symptoms began several days ago.  Interventions attempted: Nothing.  Symptoms are: gradually worsening.  Triage Disposition: Go to ED Now (or PCP Triage), See HCP Within 4 Hours (Or PCP Triage)  Patient/caregiver understands and will follow disposition?: No, refuses disposition   Copied from CRM [id number]. Topic: Clinical - Red Word Triage >> Aug 21, 2024 11:31 AM Deleta RAMAN wrote: Red Word that prompted transfer to Nurse Triage: patient complains about pains and numbness in legs and feet. Unable to keep legs and feet stable. Also no able to sleep due to leg pain Reason for Disposition  [1] Tingling (e.g., pins and needles) of the face, arm / hand, or leg / foot on one side of the body AND [2] present now  (Exceptions: Chronic/recurrent symptom lasting > 4 weeks; or from known cause, such as: bumped elbow, carpal tunnel, pinched nerve.)    Both legs  Patient sounds very sick or weak to the triager    Stated never experienced pain this bad that keeps him up at night.  Answer Assessment - Initial Assessment Questions Neuropathy, type 2 diabetic. Denied further triaging, wants to scheudle appointment. Advised patient to ED for symptoms, patient stated he has never had the pain this bad before keeping him up at night. Patient denies ED and was adamant on appointment being scheduled with PCP.   1. SYMPTOM: What is the main symptom you are concerned about? (e.g., weakness, numbness)             Pain, hasn't been able to sleep       2. ONSET: When did this start? (e.g., minutes, hours, days; while sleeping)     Ongoing  Protocols used: Neurologic Deficit-A-AH

## 2024-08-23 ENCOUNTER — Other Ambulatory Visit: Payer: Self-pay | Admitting: Internal Medicine

## 2024-08-26 ENCOUNTER — Encounter: Payer: Self-pay | Admitting: Internal Medicine

## 2024-08-26 ENCOUNTER — Ambulatory Visit (INDEPENDENT_AMBULATORY_CARE_PROVIDER_SITE_OTHER): Admitting: Internal Medicine

## 2024-08-26 VITALS — BP 116/90 | HR 62 | Temp 97.8°F | Ht 72.0 in | Wt 280.2 lb

## 2024-08-26 DIAGNOSIS — I10 Essential (primary) hypertension: Secondary | ICD-10-CM

## 2024-08-26 DIAGNOSIS — Z7984 Long term (current) use of oral hypoglycemic drugs: Secondary | ICD-10-CM | POA: Diagnosis not present

## 2024-08-26 DIAGNOSIS — E1149 Type 2 diabetes mellitus with other diabetic neurological complication: Secondary | ICD-10-CM | POA: Diagnosis not present

## 2024-08-26 DIAGNOSIS — Z7985 Long-term (current) use of injectable non-insulin antidiabetic drugs: Secondary | ICD-10-CM | POA: Diagnosis not present

## 2024-08-26 DIAGNOSIS — E559 Vitamin D deficiency, unspecified: Secondary | ICD-10-CM

## 2024-08-26 DIAGNOSIS — G629 Polyneuropathy, unspecified: Secondary | ICD-10-CM

## 2024-08-26 MED ORDER — AMITRIPTYLINE HCL 50 MG PO TABS: ORAL_TABLET | ORAL | 5 refills | Status: AC

## 2024-08-26 NOTE — Patient Instructions (Addendum)
 Please take all new medication as prescribed- the Elavil  50 - 100 mg at bedtime as needed for nerve pain  Please continue all other medications as before, and refills have been done if requested.  Please have the pharmacy call with any other refills you may need.  Please continue your efforts at being more active, low cholesterol diet, and weight control.  Please keep your appointments with your specialists as you may have planned  Your Form will be filled out soon  Please make an Appointment to return in Jan 29 2025, or sooner if needed

## 2024-08-26 NOTE — Progress Notes (Signed)
 Cardiology Office Note:  .   Date:  08/26/2024 ID:  Leon Nelson, DOB [birth year], MRN [id number] PCP: Norleen Lynwood ORN, MD [facility] Providers Cardiologist:  None   Patient Profile: .      PMH Dyslipidemia Hypertension Dilatation of ascending aorta 4.3 cm on CT 02/2024 Coronary artery disease CT Calcium  score 02/26/2024  CAC Score 692 (80th percentile) LM 0, LAD 588, LCx 24.5, RCA 79.6 Type 2 diabetes Obesity  Previously seen by Dr. Court for evaluation due to history of presyncope. History of hypertrophic cardiomyopathy in his mother who underwent septal myectomy at [facility] in 09-15-1996. He was apparently genetically tested and did not have the gene. HIs father died in [facility] in 09/15/1998, no prior history of heart disease to his awareness. Starting having episodes of presyncope a month prior to telemedicine appointment 05/2019 with no LOC. Echo 06/12/2019 revealed normal LVEF, mild LVH, impaired relaxation, no significant valve disease.  Event monitor showed no arrhythmias that could have contributed to syncope.  BP was well-controlled.  He had a history of hyperlipidemia with LDL 65 on statin.  Seen by me on 05/06/24 for evaluation of elevated coronary calcium  score of 692, placing him in 80th percentile for age/sex matched controls. Reported shortness of breath for approximately the last year with walking or doing yard work, requiring rest after about 10 minutes of activity.  He estimated that his driveway is about 300 feet and he has to stop at least 1 time when taking the trash can out to the road. No chest pain, palpitations, orthopnea, PND, edema. Occasional dizziness without presyncope or syncope. History of obesity, previously weighing 357 pounds approximately 6 years ago and now weighing around 280 pounds. He attributes some of his weight loss to being on Ozempic  for blood sugar management, which has also reduced his appetite. Has significantly changed his eating habits, now eating less  and sometimes going long periods without food. He used to consume large amounts of milk and bread but has reduced these significantly.  Says he was addicted to food.  Reports his wife is an exceptional cook and both of them have gained significant weight since getting married in September 16, 1999.  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  He is sedentary during this time. History of peripheral neuropathy.  ASCVD risk score was 26.9%.  Due to concerning symptoms, PET/CT was completed 07/07/2024 and revealed no evidence of ischemia or infarction, EF 54%, low risk study.  Additionally, it showed evidence of prior granulomatous lung disease with recommendation to follow-up with pulmonology if shortness of breath continued.  Echo which was ordered by PCP was completed 05/19/2024 and revealed normal LVEF 55%, G1 DD, no rwma, normal RV, no significant valve disease.       History of Present Illness: .    History of Present Illness Leon Nelson is a very pleasant 68 year old male who was referred   Family history: His family history includes Alcohol abuse in an other family member; Cancer in his mother; Diabetes in an other family member; Heart disease in his father and mother; Hyperlipidemia in his father and mother.  Mother had hypertrophic cardiomyopathy  Discussed the use of AI scribe software for clinical note transcription with the patient, who gave verbal consent to proceed.  No results found for: LIPOA  ROS: See HPI       Studies Reviewed: .          Risk Assessment/Calculations:             Physical Exam:   VS: There were no vitals taken for this visit.  Wt Readings from Last 3 Encounters:  08/26/24 280 lb 3.2 oz (127.1 kg)  07/29/24 279 lb 6.4 oz (126.7 kg)  05/26/24 281 lb 6.4 oz (127.6 kg)     GEN: Obese, well developed in no acute  distress NECK: No JVD; No carotid bruits CARDIAC: RRR, no murmurs, rubs, gallops RESPIRATORY:  Clear to auscultation without rales, wheezing or rhonchi  ABDOMEN: Soft, non-tender, protuberant EXTREMITIES:  No edema; No deformity     ASSESSMENT AND PLAN: .    Assessment & Plan Shortness of breath   Chronic dyspnea worsens with physical activity like walking 300 feet or doing 10 minutes of yard work. No orthopnea, PND, edema, or chest pain. PCP ordered an exercise myoview, but due to his SOB and body habitus, I do not think this test will be most beneficial to evaluate for ischemia.  We will get cardiac PET CT he has an echocardiogram scheduled for later this month.  Advised him to report any new or worsening symptoms, such as chest pressure with walking.  CAD CT calcium  score 02/26/2024 with CAC of 692 (80th percentile) severe atherosclerotic burden, with bulk of calcification in LAD.  As noted above he is having DOE.  No chest pain, palpitations, presyncope, syncope.  He is not exercising on a consistent basis.  No acute concerns on EKG today. We will get PET/CT to evaluate for ischemia. No bleeding concerns.  Continue aspirin .  Lipids are well-controlled on rosuvastatin .  Continue rosuvastatin , lisinopril , Tenoretic . Focus on heart healthy diet consisting of whole grains, lean protein, fruits and vegetables, and avoiding sugar, simple carbohydrates, processed foods, and saturated fat. Aim to increase physical activity as tolerated and notify us  if he develops new or worsening symptoms.   Hyperlipidemia LDL goal < 55  Lipid panel completed 07/29/2024 with total cholesterol 105, triglycerides 83, HDL 40, and LDL 48.  Lipids are well-controlled on rosuvastatin .  Hypertension   Hypertension is well-controlled with lisinopril  and atenolol .  Renal function stable on labs completed 01/29/2024.  No changes to antihypertensive therapy today.  Type 2 diabetes mellitus   Reports significant weight loss on  Ozempic . A1C 8.5% on 01/29/24. He reports significant changes in appetite and eating habits since starting the medication. Blood sugar monitoring is ongoing, but dietary habits need improvement. Encouraged regular meals with balanced nutrition to stabilize blood sugar levels and continue regular blood sugar monitoring. Management per PCP.  Obesity   Obesity has improved with weight reduction from 357 lbs to 280 lbs. Current management includes Ozempic  and generally eating less. Emphasized the importance of eating a balanced diet with whole grains, beans, vegetables, fruits, and lean protein and advised avoiding simple carbohydrates and non-nutritional foods. Discuss potential benefits of regular exercise, such as treadmill use or exploring gym or pool options.  Ascending Aortic Dilatation 4.3 cm on CT 02/2024. Repeat imaging in 1 year. Maintain good BP control.      Plan/Goals:           Disposition: ***  Signed, Rosaline Bane, NP-C

## 2024-08-26 NOTE — Progress Notes (Signed)
 Patient ID: Leon Nelson, male   DOB: 1956/02/16, 68 y.o.   MRN: [id number]        Chief Complaint: follow up painful neuropathy needing FMLA, dm, htn, low vit d       HPI:  Leon Nelson is a 68 y.o. male here with c/o recent worsening now  moderate painful neuropathy with severe flares on a regular basis.  Pt needs FMLA to help cover for needed time away from the job he estimates at 2 hours per wk, and pain often wakes him up at night in the past 2 months.  Pt denies chest pain, increased sob or doe, wheezing, orthopnea, PND, increased LE swelling, palpitations, dizziness or syncope.   Pt denies polydipsia, polyuria, or new focal neuro s/s.   Pt denies fever, wt loss, night sweats, loss of appetite, or other constitutional symptoms  former recent work stress has much improved it seems with the time away from the work earlier this yr.  Plans to f/u with neurology soon       Wt Readings from Last 3 Encounters:  08/27/24 278 lb 8 oz (126.3 kg)  08/26/24 280 lb 3.2 oz (127.1 kg)  07/29/24 279 lb 6.4 oz (126.7 kg)   BP Readings from Last 3 Encounters:  08/27/24 106/80  08/26/24 (!) 116/90  07/29/24 108/70         Past Medical History:  Diagnosis Date   ABDOMINAL PAIN OTHER SPECIFIED SITE 07/20/2008   CHEST PAIN 02/06/2011   COLONIC POLYPS, HX OF 05/26/2008   DEPRESSION 05/26/2008   DIABETES MELLITUS, TYPE II 05/26/2008   HYPERLIPIDEMIA 05/26/2008   HYPERTENSION 05/26/2008   HYPOTHYROIDISM 05/26/2008   KNEE PAIN, RIGHT, ACUTE 07/01/2008   Personal history of urinary calculi 05/26/2008   Pure hypercholesterolemia 05/19/2024   UTI'S, HX OF 05/26/2008   Past Surgical History:  Procedure Laterality Date   s/p right knee arthroplasty      complicated by patellar tendon rupture Jan 2011 Dr. heide   TONSILLECTOMY      reports that he has never smoked. He has never used smokeless tobacco. He reports that he does not drink alcohol and does not use drugs. family history includes  Alcohol abuse in an other family member; Cancer in his mother; Diabetes in an other family member; Heart disease in his father and mother; Hyperlipidemia in his father and mother. Allergies  Allergen Reactions   Penicillins Other (See Comments)    Blisters on hands   Sulfonamide Derivatives Other (See Comments)    Blisters in groin area   Current Outpatient Medications on File Prior to Visit  Medication Sig Dispense Refill   Accu-Chek Softclix Lancets lancets USE AS DIRECTED TWICE DAILY 100 each 3   albuterol  (VENTOLIN  HFA) 108 (90 Base) MCG/ACT inhaler Inhale 2 puffs into the lungs every 6 (six) hours as needed for wheezing or shortness of breath. 8 g 11   allopurinol  (ZYLOPRIM ) 300 MG tablet TAKE 1 TABLET BY MOUTH DAILY 100 tablet 2   aspirin  EC 81 MG tablet Take 1 tablet (81 mg total) by mouth daily. Swallow whole.     atenolol -chlorthalidone  (TENORETIC ) 50-25 MG tablet TAKE ONE-HALF TABLET BY MOUTH  DAILY 50 tablet 2   Blood Glucose Calibration (ACCU-CHEK GUIDE CONTROL) LIQD Use as directed once daily as needed E11.9 1 each 1   Blood Glucose Monitoring Suppl (ACCU-CHEK GUIDE ME) w/Device KIT Use as directed up to four times per day E11.9 1 kit 0   Continuous Glucose  Receiver (FREESTYLE LIBRE 3 READER) DEVI Use as directed four times per day E11.9 1 each 0   Continuous Glucose Sensor (FREESTYLE LIBRE 3 PLUS SENSOR) MISC Change sensor every 15 days. E11.9 6 each 3   dapagliflozin  propanediol (FARXIGA ) 10 MG TABS tablet Take 1 tablet (10 mg total) by mouth daily before breakfast. 90 tablet 3   diclofenac  sodium (VOLTAREN ) 1 % GEL Apply 2 g topically 4 (four) times daily as needed. 200 g 5   dicyclomine  (BENTYL ) 20 MG tablet TAKE 1 TABLET BY MOUTH 3 TIMES  DAILY AS NEEDED 300 tablet 3   gabapentin  (NEURONTIN ) 300 MG capsule TAKE 1 CAPSULE BY MOUTH TWICE  DAILY 200 capsule 1   glipiZIDE  (GLUCOTROL  XL) 10 MG 24 hr tablet TAKE 1 TABLET BY MOUTH DAILY  WITH BREAKFAST 100 tablet 3   ketoconazole  (NIZORAL) 2 % shampoo Apply 1 Application topically 3 (three) times a week.     levothyroxine  (SYNTHROID ) 50 MCG tablet TAKE 1 TABLET BY MOUTH DAILY 100 tablet 3   lisinopril  (ZESTRIL ) 20 MG tablet TAKE 1 TABLET BY MOUTH DAILY 100 tablet 2   metFORMIN  (GLUCOPHAGE -XR) 500 MG 24 hr tablet TAKE 2 TABLETS BY MOUTH TWICE  DAILY WITH A MEAL 400 tablet 2   ONETOUCH ULTRA test strip USE AS INSTRUCTED ONCE DAILY 100 strip 2   potassium chloride  SA (KLOR-CON  M) 20 MEQ tablet TAKE 2 TABLETS BY MOUTH DAILY 200 tablet 2   rosuvastatin  (CRESTOR ) 40 MG tablet TAKE 1 TABLET BY MOUTH DAILY 90 tablet 3   Semaglutide , 1 MG/DOSE, 4 MG/3ML SOPN Inject 1 mg as directed once a week. 9 mL 3   solifenacin  (VESICARE ) 5 MG tablet Take 1 tablet by mouth once daily 90 tablet 0   venlafaxine  XR (EFFEXOR -XR) 75 MG 24 hr capsule TAKE 1 CAPSULE BY MOUTH DAILY 100 capsule 2   No current facility-administered medications on file prior to visit.        ROS:  All others reviewed and negative.  Objective        PE:  BP (!) 116/90 (BP Location: Left Arm, Patient Position: Sitting, Cuff Size: Normal)   Pulse 62   Temp 97.8 F (36.6 C) (Oral)   Ht 6' (1.829 m)   Wt 280 lb 3.2 oz (127.1 kg)   SpO2 97%   BMI 38.00 kg/m                 Constitutional: Pt appears in NAD               HENT: Head: NCAT.                Right Ear: External ear normal.                 Left Ear: External ear normal.                Eyes: . Pupils are equal, round, and reactive to light. Conjunctivae and EOM are normal               Nose: without d/c or deformity               Neck: Neck supple. Gross normal ROM               Cardiovascular: Normal rate and regular rhythm.                 Pulmonary/Chest: Effort normal and breath sounds without rales or wheezing.  Abd:  Soft, NT, ND, + BS, no organomegaly               Neurological: Pt is alert. At baseline orientation, motor grossly intact               Skin: Skin is warm. No  rashes, no other new lesions, LE edema - trace pedal               Psychiatric: Pt behavior is normal without agitation   Micro: none  Cardiac tracings I have personally interpreted today:  none  Pertinent Radiological findings (summarize): none   Lab Results  Component Value Date   WBC 7.8 05/19/2024   HGB 15.1 05/19/2024   HCT 45.2 05/19/2024   PLT 219.0 05/19/2024   GLUCOSE 169 (H) 07/29/2024   CHOL 105 07/29/2024   TRIG 83.0 07/29/2024   HDL 40.90 07/29/2024   LDLDIRECT 72.0 07/17/2022   LDLCALC 48 07/29/2024   ALT 27 07/29/2024   AST 20 07/29/2024   NA 142 07/29/2024   K 4.6 07/29/2024   CL 104 07/29/2024   CREATININE 0.87 07/29/2024   BUN 22 07/29/2024   CO2 31 07/29/2024   TSH 3.52 01/29/2024   PSA 0.10 01/29/2024   INR 1.0 09/13/2023   HGBA1C 7.3 (H) 07/29/2024   MICROALBUR 0.7 02/17/2009   Assessment/Plan:  SHELDON SEM is a 68 y.o. White or Caucasian [1] male with  has a past medical history of ABDOMINAL PAIN OTHER SPECIFIED SITE (07/20/2008), CHEST PAIN (02/06/2011), COLONIC POLYPS, HX OF (05/26/2008), DEPRESSION (05/26/2008), DIABETES MELLITUS, TYPE II (05/26/2008), HYPERLIPIDEMIA (05/26/2008), HYPERTENSION (05/26/2008), HYPOTHYROIDISM (05/26/2008), KNEE PAIN, RIGHT, ACUTE (07/01/2008), Personal history of urinary calculi (05/26/2008), Pure hypercholesterolemia (05/19/2024), and UTI'S, HX OF (05/26/2008).  Diabetes mellitus type 2 with neurological manifestations (HCC) Lab Results  Component Value Date   HGBA1C 7.3 (H) 07/29/2024   uncontrolled, pt to continue current medical treatment farxiga  10 mg every day, glpizide xl 10 every day, metformin  ER 500 gm 2 bid, and ozempic  1 mg weekly - declines other change for now   Essential hypertension BP Readings from Last 3 Encounters:  08/27/24 106/80  08/26/24 (!) 116/90  07/29/24 108/70   Stable, pt to continue medical treatment tenoretic  50 25 mg every day, lisinopril  20 qd   Neuropathy With recent  subjective worsening pain , for elavil  50 mg at bedtime prn, f/u neurology as planned, and FMLA to be filled out  Vitamin D  deficiency Last vitamin D  Lab Results  Component Value Date   VD25OH 72.07 07/29/2024   Stable, cont oral replacement  Followup: Return in about 5 months (around 01/29/2025).  Lynwood Rush, MD 08/29/2024 7:31 PM [facility]

## 2024-08-27 ENCOUNTER — Encounter (HOSPITAL_BASED_OUTPATIENT_CLINIC_OR_DEPARTMENT_OTHER): Payer: Self-pay | Admitting: Student

## 2024-08-27 ENCOUNTER — Ambulatory Visit (HOSPITAL_BASED_OUTPATIENT_CLINIC_OR_DEPARTMENT_OTHER)

## 2024-08-27 ENCOUNTER — Ambulatory Visit (HOSPITAL_BASED_OUTPATIENT_CLINIC_OR_DEPARTMENT_OTHER): Admitting: Student

## 2024-08-27 ENCOUNTER — Ambulatory Visit (HOSPITAL_BASED_OUTPATIENT_CLINIC_OR_DEPARTMENT_OTHER): Admitting: Nurse Practitioner

## 2024-08-27 ENCOUNTER — Encounter (HOSPITAL_BASED_OUTPATIENT_CLINIC_OR_DEPARTMENT_OTHER): Payer: Self-pay | Admitting: Nurse Practitioner

## 2024-08-27 VITALS — BP 106/80 | HR 89 | Ht 72.0 in | Wt 278.5 lb

## 2024-08-27 DIAGNOSIS — I2510 Atherosclerotic heart disease of native coronary artery without angina pectoris: Secondary | ICD-10-CM | POA: Diagnosis not present

## 2024-08-27 DIAGNOSIS — R931 Abnormal findings on diagnostic imaging of heart and coronary circulation: Secondary | ICD-10-CM

## 2024-08-27 DIAGNOSIS — I7121 Aneurysm of the ascending aorta, without rupture: Secondary | ICD-10-CM | POA: Diagnosis not present

## 2024-08-27 DIAGNOSIS — M25511 Pain in right shoulder: Secondary | ICD-10-CM

## 2024-08-27 DIAGNOSIS — I2583 Coronary atherosclerosis due to lipid rich plaque: Secondary | ICD-10-CM

## 2024-08-27 DIAGNOSIS — R0609 Other forms of dyspnea: Secondary | ICD-10-CM | POA: Diagnosis not present

## 2024-08-27 DIAGNOSIS — E1149 Type 2 diabetes mellitus with other diabetic neurological complication: Secondary | ICD-10-CM

## 2024-08-27 DIAGNOSIS — M25519 Pain in unspecified shoulder: Secondary | ICD-10-CM | POA: Diagnosis not present

## 2024-08-27 NOTE — Patient Instructions (Signed)
 Medication Instructions:   Your physician recommends that you continue on your current medications as directed. Please refer to the Current Medication list given to you today.   *If you need a refill on your cardiac medications before your next appointment, please call your pharmacy*  Lab Work:  None ordered.  If you have labs (blood work) drawn today and your tests are completely normal, you will receive your results only by: MyChart Message (if you have MyChart) OR A paper copy in the mail If you have any lab test that is abnormal or we need to change your treatment, we will call you to review the results.  Testing/Procedures:  Non-Cardiac CT Angiography (CTA), is a special type of CT scan that uses a computer to produce multi-dimensional views of major blood vessels throughout the body. In CT angiography, a contrast material is injected through an IV to help visualize the blood vessels  Follow-Up: At [facility], you and your health needs are our priority.  As part of our continuing mission to provide you with exceptional heart care, our providers are all part of one team.  This team includes your primary Cardiologist (physician) and Advanced Practice Providers or APPs (Physician Assistants and Nurse Practitioners) who all work together to provide you with the care you need, when you need it.  Your next appointment:   1 year(s)  Provider:   Rosaline Bane, NP    We recommend signing up for the patient portal called MyChart.  Sign up information is provided on this After Visit Summary.  MyChart is used to connect with patients for Virtual Visits (Telemedicine).  Patients are able to view lab/test results, encounter notes, upcoming appointments, etc.  Non-urgent messages can be sent to your provider as well.   To learn more about what you can do with MyChart, go to [email or web address].   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  The word aneurysm refers to a bulge in an artery (blood vessel). Most people think of them in the context of an emergency, but yours was found incidentally. At this point there is nothing you need to do from a procedure standpoint, but there are some important things to keep in mind for day-to-day life.  Mainstays of therapy for aneurysms include very good blood pressure control, healthy lifestyle, and avoiding tobacco products and street drugs. Research has raised concern that antibiotics in the fluoroquinolone class could be associated with increased risk of having an aneurysm develop or tear. This includes medicines that end in floxacin, like Cipro or Levaquin . Make sure to discuss this information with other healthcare providers if you require antibiotics.  Since aneurysms can run in families, you should discuss your diagnosis with first degree relatives as they may need to be screened for this. Regular mild-moderate physical exercise is important, but avoid heavy lifting/weight lifting over 30lbs, chopping wood, shoveling snow or digging heavy earth with a shovel. It is best to avoid activities that cause grunting or straining (medically referred to as a Valsalva maneuver). This happens when a person bears down against a closed throat to increase the strength of arm or abdominal muscles. There's often a tendency to do this when lifting heavy weights, doing sit-ups, push-ups or chin-ups, etc., but it may be harmful.  This is a finding I would expect to be monitored periodically by your cardiology team. Most unruptured thoracic aortic aneurysms cause no symptoms, so they are often found  during exams for other conditions. Contact a health care provider if you develop any discomfort in your upper back, neck, abdomen, trouble swallowing, cough or hoarseness, or unexplained weight loss. Get help right away if you develop severe pain in your upper back or abdomen that may move into your  chest and arms, or any other concerning symptoms such as shortness of breath or fever.

## 2024-08-27 NOTE — Progress Notes (Signed)
 Chief Complaint: Right shoulder injury     History of Present Illness:    Leon Nelson is a 68 y.o. male who presents today for evaluation of a right shoulder injury.  He unfortunately sustained a fall in the parking lot at [employer] this morning while working on his phone and landed on his right side.  States that he does not have any pain while pressing on his arm however he is unable to lift the arm and this causes a sharp pain.  Denies any numbness or tingling.  He is right-hand dominant.  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   Surgical History:   None  PMH/PSH/Family History/Social History/Meds/Allergies:    Past Medical History:  Diagnosis Date   ABDOMINAL PAIN OTHER SPECIFIED SITE 07/20/2008   CHEST PAIN 02/06/2011   COLONIC POLYPS, HX OF 05/26/2008   DEPRESSION 05/26/2008   DIABETES MELLITUS, TYPE II 05/26/2008   HYPERLIPIDEMIA 05/26/2008   HYPERTENSION 05/26/2008   HYPOTHYROIDISM 05/26/2008   KNEE PAIN, RIGHT, ACUTE 07/01/2008   Personal history of urinary calculi 05/26/2008   Pure hypercholesterolemia 05/19/2024   UTI'S, HX OF 05/26/2008   Past Surgical History:  Procedure Laterality Date   s/p right knee arthroplasty      complicated by patellar tendon rupture Jan 2011 Dr. heide   TONSILLECTOMY     Social History   Socioeconomic History   Marital status: Married    Spouse name: Not on file   Number of children: Not on file   Years of education: Not on file   Highest education level: Some college, no degree  Occupational History   Not on file  Tobacco Use   Smoking status: Never   Smokeless tobacco: Never  Substance and Sexual Activity   Alcohol use: No   Drug use: No   Sexual activity: Yes  Other Topics Concern   Not on file  Social History Narrative   married   Social Drivers of Corporate investment banker Strain: Low Risk  (07/25/2024)   Overall Financial Resource Strain (CARDIA)    Difficulty of  Paying Living Expenses: Not hard at all  Food Insecurity: No Food Insecurity (07/25/2024)   Hunger Vital Sign    Worried About Running Out of Food in the Last Year: Never true    Ran Out of Food in the Last Year: Never true  Transportation Needs: No Transportation Needs (07/25/2024)   PRAPARE - Administrator, Civil Service (Medical): No    Lack of Transportation (Non-Medical): No  Physical Activity: Inactive (07/25/2024)   Exercise Vital Sign    Days of Exercise per Week: 0 days    Minutes of Exercise per Session: Not on file  Stress: No Stress Concern Present (07/25/2024)   [employer] of Occupational Health - Occupational Stress Questionnaire    Feeling of Stress: Not at all  Social Connections: Socially Integrated (07/25/2024)   Social Connection and Isolation Panel    Frequency of Communication with Friends and Family: More than three times a week    Frequency of Social Gatherings with Friends and Family: Once a week    Attends Religious Services: More than 4 times per year    Active Member of [employer] or Organizations: Yes    Attends Banker Meetings: More than 4  times per year    Marital Status: Married   Family History  Problem Relation Age of Onset   Cancer Mother        breast cancer   Hyperlipidemia Mother    Heart disease Mother    Hyperlipidemia Father    Heart disease Father    Alcohol abuse Other    Diabetes Other    Allergies  Allergen Reactions   Penicillins Other (See Comments)    Blisters on hands   Sulfonamide Derivatives Other (See Comments)    Blisters in groin area   Current Outpatient Medications  Medication Sig Dispense Refill   Accu-Chek Softclix Lancets lancets USE AS DIRECTED TWICE DAILY 100 each 3   albuterol  (VENTOLIN  HFA) 108 (90 Base) MCG/ACT inhaler Inhale 2 puffs into the lungs every 6 (six) hours as needed for wheezing or shortness of breath. 8 g 11   allopurinol  (ZYLOPRIM ) 300 MG tablet TAKE 1 TABLET BY MOUTH DAILY  100 tablet 2   amitriptyline  (ELAVIL ) 50 MG tablet 1 - 2 tabs by mouth at bedtime for nerve pain 60 tablet 5   aspirin  EC 81 MG tablet Take 1 tablet (81 mg total) by mouth daily. Swallow whole.     atenolol -chlorthalidone  (TENORETIC ) 50-25 MG tablet TAKE ONE-HALF TABLET BY MOUTH  DAILY 50 tablet 2   Blood Glucose Calibration (ACCU-CHEK GUIDE CONTROL) LIQD Use as directed once daily as needed E11.9 1 each 1   Blood Glucose Monitoring Suppl (ACCU-CHEK GUIDE ME) w/Device KIT Use as directed up to four times per day E11.9 1 kit 0   Continuous Glucose Receiver (FREESTYLE LIBRE 3 READER) DEVI Use as directed four times per day E11.9 1 each 0   Continuous Glucose Sensor (FREESTYLE LIBRE 3 PLUS SENSOR) MISC Change sensor every 15 days. E11.9 6 each 3   dapagliflozin  propanediol (FARXIGA ) 10 MG TABS tablet Take 1 tablet (10 mg total) by mouth daily before breakfast. 90 tablet 3   diclofenac  sodium (VOLTAREN ) 1 % GEL Apply 2 g topically 4 (four) times daily as needed. 200 g 5   dicyclomine  (BENTYL ) 20 MG tablet TAKE 1 TABLET BY MOUTH 3 TIMES  DAILY AS NEEDED 300 tablet 3   gabapentin  (NEURONTIN ) 300 MG capsule TAKE 1 CAPSULE BY MOUTH TWICE  DAILY 200 capsule 1   glipiZIDE  (GLUCOTROL  XL) 10 MG 24 hr tablet TAKE 1 TABLET BY MOUTH DAILY  WITH BREAKFAST 100 tablet 3   ketoconazole (NIZORAL) 2 % shampoo Apply 1 Application topically 3 (three) times a week.     levothyroxine  (SYNTHROID ) 50 MCG tablet TAKE 1 TABLET BY MOUTH DAILY 100 tablet 3   lisinopril  (ZESTRIL ) 20 MG tablet TAKE 1 TABLET BY MOUTH DAILY 100 tablet 2   metFORMIN  (GLUCOPHAGE -XR) 500 MG 24 hr tablet TAKE 2 TABLETS BY MOUTH TWICE  DAILY WITH A MEAL 400 tablet 2   ONETOUCH ULTRA test strip USE AS INSTRUCTED ONCE DAILY 100 strip 2   potassium chloride  SA (KLOR-CON  M) 20 MEQ tablet TAKE 2 TABLETS BY MOUTH DAILY 200 tablet 2   rosuvastatin  (CRESTOR ) 40 MG tablet TAKE 1 TABLET BY MOUTH DAILY 90 tablet 3   Semaglutide , 1 MG/DOSE, 4 MG/3ML SOPN Inject 1  mg as directed once a week. 9 mL 3   solifenacin  (VESICARE ) 5 MG tablet Take 1 tablet by mouth once daily 90 tablet 0   venlafaxine  XR (EFFEXOR -XR) 75 MG 24 hr capsule TAKE 1 CAPSULE BY MOUTH DAILY 100 capsule 2   No current facility-administered  medications for this visit.   No results found.  Review of Systems:   A ROS was performed including pertinent positives and negatives as documented in the HPI.  Physical Exam :   Constitutional: NAD and appears stated age Neurological: Alert and oriented Psych: Appropriate affect and cooperative There were no vitals taken for this visit.   Comprehensive Musculoskeletal Exam:    No significant tenderness or ecchymosis of the right shoulder.  Patient is only able to perform 30 degrees of active forward flexion and 20 degrees of external rotation, compared to 160/30 on contralateral side.  Distal motor and neurosensory exam is intact.  Radial pulse 2+.  Imaging:   Xray (right shoulder 3 views): Negative for acute fracture or dislocation.  Mild glenohumeral and AC joint degenerative changes.   I personally reviewed and interpreted the radiographs.   Assessment:   68 y.o. male with an acute right shoulder injury sustained due to a fall in a parking lot this morning.  X-rays taken today showed no evidence of fracture or dislocation.  He does have very limited range of motion and therefore I do have suspicion for a traumatic rotator cuff injury.  He states that he had good function prior to the fall.  I will place him in a sling today for comfort and would like to proceed with a stat MRI of the shoulder for further evaluation particular the rotator cuff.  Will plan to have him return to clinic shortly after to review and discuss a treatment plan.  Plan :    - Obtain stat MRI of the right shoulder and return to clinic for review and treatment discussion     I personally saw and evaluated the patient, and participated in the management and  treatment plan.  Leonce Reveal, PA-C Orthopedics

## 2024-08-28 ENCOUNTER — Telehealth: Payer: Self-pay

## 2024-08-28 NOTE — Telephone Encounter (Signed)
 Copied from CRM [id number]. Topic: General - Other >> Aug 28, 2024 10:47 AM Burnard DEL wrote: Reason for CRM: Patient would like to know if paperwork that he left at his appointment on Wednesday to be completed would be ready for pick up today? He stated that he will be in [place] and would like to pick them up today. Please contact patient if ready today.

## 2024-08-28 NOTE — Telephone Encounter (Signed)
 PT called to follow up with paperwork DPO 9.5.25

## 2024-08-29 ENCOUNTER — Encounter: Payer: Self-pay | Admitting: Internal Medicine

## 2024-08-29 NOTE — Assessment & Plan Note (Signed)
 BP Readings from Last 3 Encounters:  08/27/24 106/80  08/26/24 (!) 116/90  07/29/24 108/70   Stable, pt to continue medical treatment tenoretic  50 25 mg every day, lisinopril  20 qd

## 2024-08-29 NOTE — Assessment & Plan Note (Signed)
 With recent subjective worsening pain , for elavil  50 mg at bedtime prn, f/u neurology as planned, and FMLA to be filled out

## 2024-08-29 NOTE — Assessment & Plan Note (Signed)
 Last vitamin D  Lab Results  Component Value Date   VD25OH 72.07 07/29/2024   Stable, cont oral replacement

## 2024-08-29 NOTE — Assessment & Plan Note (Signed)
 Lab Results  Component Value Date   HGBA1C 7.3 (H) 07/29/2024   uncontrolled, pt to continue current medical treatment farxiga  10 mg every day, glpizide xl 10 every day, metformin  ER 500 gm 2 bid, and ozempic  1 mg weekly - declines other change for now

## 2024-08-30 ENCOUNTER — Ambulatory Visit (INDEPENDENT_AMBULATORY_CARE_PROVIDER_SITE_OTHER)

## 2024-08-30 DIAGNOSIS — M7581 Other shoulder lesions, right shoulder: Secondary | ICD-10-CM | POA: Diagnosis not present

## 2024-08-30 DIAGNOSIS — S46011A Strain of muscle(s) and tendon(s) of the rotator cuff of right shoulder, initial encounter: Secondary | ICD-10-CM | POA: Diagnosis not present

## 2024-08-30 DIAGNOSIS — M25511 Pain in right shoulder: Secondary | ICD-10-CM | POA: Diagnosis not present

## 2024-08-30 DIAGNOSIS — M129 Arthropathy, unspecified: Secondary | ICD-10-CM | POA: Diagnosis not present

## 2024-08-30 DIAGNOSIS — S46811A Strain of other muscles, fascia and tendons at shoulder and upper arm level, right arm, initial encounter: Secondary | ICD-10-CM | POA: Diagnosis not present

## 2024-09-01 ENCOUNTER — Telehealth: Payer: Self-pay | Admitting: Radiology

## 2024-09-01 NOTE — Telephone Encounter (Signed)
 Copied from CRM [id number]. Topic: Clinical - Medical Advice >> Sep 01, 2024 11:45 AM Anairis L wrote: Reason for CRM: Patient is following up on paperwork he submitted. FMLA paperwork deadline is 09/04/2024

## 2024-09-03 ENCOUNTER — Ambulatory Visit (HOSPITAL_BASED_OUTPATIENT_CLINIC_OR_DEPARTMENT_OTHER): Admitting: Student

## 2024-09-03 DIAGNOSIS — S46011A Strain of muscle(s) and tendon(s) of the rotator cuff of right shoulder, initial encounter: Secondary | ICD-10-CM

## 2024-09-03 NOTE — Telephone Encounter (Signed)
 Attempted to call patient but had to leave a voice message. Form has been completed and signed off by Dr.John. Ready for pick up

## 2024-09-03 NOTE — Progress Notes (Signed)
 Chief Complaint: Right shoulder injury     History of Present Illness:   09/03/24: Patient presents today for right shoulder MRI review.  Reports continued moderate discomfort despite utilization of Tylenol  and ibuprofen.  Still unable to lift his right arm.   Leon Nelson is a 68 y.o. male who presents today for evaluation of a right shoulder injury.  He unfortunately sustained a fall in the parking lot at [employer] this morning while working on his phone and landed on his right side.  States that he does not have any pain while pressing on his arm however he is unable to lift the arm and this causes a sharp pain.  Denies any numbness or tingling.  He is right-hand dominant.  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   Surgical History:   None  PMH/PSH/Family History/Social History/Meds/Allergies:    Past Medical History:  Diagnosis Date   ABDOMINAL PAIN OTHER SPECIFIED SITE 07/20/2008   CHEST PAIN 02/06/2011   COLONIC POLYPS, HX OF 05/26/2008   DEPRESSION 05/26/2008   DIABETES MELLITUS, TYPE II 05/26/2008   HYPERLIPIDEMIA 05/26/2008   HYPERTENSION 05/26/2008   HYPOTHYROIDISM 05/26/2008   KNEE PAIN, RIGHT, ACUTE 07/01/2008   Personal history of urinary calculi 05/26/2008   Pure hypercholesterolemia 05/19/2024   UTI'S, HX OF 05/26/2008   Past Surgical History:  Procedure Laterality Date   s/p right knee arthroplasty      complicated by patellar tendon rupture Jan 2011 Dr. heide   TONSILLECTOMY     Social History   Socioeconomic History   Marital status: Married    Spouse name: Not on file   Number of children: Not on file   Years of education: Not on file   Highest education level: Some college, no degree  Occupational History   Not on file  Tobacco Use   Smoking status: Never   Smokeless tobacco: Never  Substance and Sexual Activity   Alcohol use: No   Drug use: No   Sexual activity: Yes  Other Topics Concern   Not on  file  Social History Narrative   married   Social Drivers of Corporate investment banker Strain: Low Risk  (07/25/2024)   Overall Financial Resource Strain (CARDIA)    Difficulty of Paying Living Expenses: Not hard at all  Food Insecurity: No Food Insecurity (07/25/2024)   Hunger Vital Sign    Worried About Running Out of Food in the Last Year: Never true    Ran Out of Food in the Last Year: Never true  Transportation Needs: No Transportation Needs (07/25/2024)   PRAPARE - Administrator, Civil Service (Medical): No    Lack of Transportation (Non-Medical): No  Physical Activity: Inactive (07/25/2024)   Exercise Vital Sign    Days of Exercise per Week: 0 days    Minutes of Exercise per Session: Not on file  Stress: No Stress Concern Present (07/25/2024)   [employer] of Occupational Health - Occupational Stress Questionnaire    Feeling of Stress: Not at all  Social Connections: Socially Integrated (07/25/2024)   Social Connection and Isolation Panel    Frequency of Communication with Friends and Family: More than three times a week    Frequency of Social Gatherings with Friends and Family: Once a week    Attends  Religious Services: More than 4 times per year    Active Member of Clubs or Organizations: Yes    Attends Banker Meetings: More than 4 times per year    Marital Status: Married   Family History  Problem Relation Age of Onset   Cancer Mother        breast cancer   Hyperlipidemia Mother    Heart disease Mother    Hyperlipidemia Father    Heart disease Father    Alcohol abuse Other    Diabetes Other    Allergies  Allergen Reactions   Penicillins Other (See Comments)    Blisters on hands   Sulfonamide Derivatives Other (See Comments)    Blisters in groin area   Current Outpatient Medications  Medication Sig Dispense Refill   Accu-Chek Softclix Lancets lancets USE AS DIRECTED TWICE DAILY 100 each 3   albuterol  (VENTOLIN  HFA) 108 (90 Base)  MCG/ACT inhaler Inhale 2 puffs into the lungs every 6 (six) hours as needed for wheezing or shortness of breath. 8 g 11   allopurinol  (ZYLOPRIM ) 300 MG tablet TAKE 1 TABLET BY MOUTH DAILY 100 tablet 2   amitriptyline  (ELAVIL ) 50 MG tablet 1 - 2 tabs by mouth at bedtime for nerve pain 60 tablet 5   aspirin  EC 81 MG tablet Take 1 tablet (81 mg total) by mouth daily. Swallow whole.     atenolol -chlorthalidone  (TENORETIC ) 50-25 MG tablet TAKE ONE-HALF TABLET BY MOUTH  DAILY 50 tablet 2   Blood Glucose Calibration (ACCU-CHEK GUIDE CONTROL) LIQD Use as directed once daily as needed E11.9 1 each 1   Blood Glucose Monitoring Suppl (ACCU-CHEK GUIDE ME) w/Device KIT Use as directed up to four times per day E11.9 1 kit 0   Continuous Glucose Receiver (FREESTYLE LIBRE 3 READER) DEVI Use as directed four times per day E11.9 1 each 0   Continuous Glucose Sensor (FREESTYLE LIBRE 3 PLUS SENSOR) MISC Change sensor every 15 days. E11.9 6 each 3   dapagliflozin  propanediol (FARXIGA ) 10 MG TABS tablet Take 1 tablet (10 mg total) by mouth daily before breakfast. 90 tablet 3   diclofenac  sodium (VOLTAREN ) 1 % GEL Apply 2 g topically 4 (four) times daily as needed. 200 g 5   dicyclomine  (BENTYL ) 20 MG tablet TAKE 1 TABLET BY MOUTH 3 TIMES  DAILY AS NEEDED 300 tablet 3   gabapentin  (NEURONTIN ) 300 MG capsule TAKE 1 CAPSULE BY MOUTH TWICE  DAILY 200 capsule 1   glipiZIDE  (GLUCOTROL  XL) 10 MG 24 hr tablet TAKE 1 TABLET BY MOUTH DAILY  WITH BREAKFAST 100 tablet 3   ketoconazole (NIZORAL) 2 % shampoo Apply 1 Application topically 3 (three) times a week.     levothyroxine  (SYNTHROID ) 50 MCG tablet TAKE 1 TABLET BY MOUTH DAILY 100 tablet 3   lisinopril  (ZESTRIL ) 20 MG tablet TAKE 1 TABLET BY MOUTH DAILY 100 tablet 2   metFORMIN  (GLUCOPHAGE -XR) 500 MG 24 hr tablet TAKE 2 TABLETS BY MOUTH TWICE  DAILY WITH A MEAL 400 tablet 2   ONETOUCH ULTRA test strip USE AS INSTRUCTED ONCE DAILY 100 strip 2   potassium chloride  SA (KLOR-CON   M) 20 MEQ tablet TAKE 2 TABLETS BY MOUTH DAILY 200 tablet 2   rosuvastatin  (CRESTOR ) 40 MG tablet TAKE 1 TABLET BY MOUTH DAILY 90 tablet 3   Semaglutide , 1 MG/DOSE, 4 MG/3ML SOPN Inject 1 mg as directed once a week. 9 mL 3   solifenacin  (VESICARE ) 5 MG tablet Take 1 tablet by mouth  once daily 90 tablet 0   venlafaxine  XR (EFFEXOR -XR) 75 MG 24 hr capsule TAKE 1 CAPSULE BY MOUTH DAILY 100 capsule 2   No current facility-administered medications for this visit.   No results found.  Review of Systems:   A ROS was performed including pertinent positives and negatives as documented in the HPI.  Physical Exam :   Constitutional: NAD and appears stated age Neurological: Alert and oriented Psych: Appropriate affect and cooperative There were no vitals taken for this visit.   Comprehensive Musculoskeletal Exam:    Exam remains unchanged.  No significant tenderness or ecchymosis of the right shoulder.  Patient is only able to perform 30 degrees of active forward flexion and 20 degrees of external rotation, compared to 160/30 on contralateral side.  Distal motor and neurosensory exam is intact.  Radial pulse 2+.  Imaging:   MRI right shoulder: Complete tears of the supraspinatus and infraspinatus with retraction.  Likely partial tears of the biceps long head and infraspinatus tendons.  No evidence of fracture.   I personally reviewed and interpreted the radiographs.   Assessment:   68 y.o. male with a recent fall onto his right shoulder.  MRI confirms complete supraspinatus and infraspinatus tears.  Patient is still a moderate amount of discomfort and is unable to perform much motion of the shoulder particularly with flexion.  I did go ahead and discuss the outlook of nonoperative treatment which would ultimately need to decrease pain however function would remain very limited.  I do think he would do well with a reverse shoulder replacement but will have him meet with Dr. Genelle to discuss this  further.  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  He also has grandchildren but is not highly physically active.  Plan :    - Follow-up with Dr. Genelle to discuss operative versus nonoperative management     I personally saw and evaluated the patient, and participated in the management and treatment plan.  Leonce Reveal, PA-C Orthopedics

## 2024-09-03 NOTE — Telephone Encounter (Signed)
 Patient returned call and has been made aware of chart notation. Patient will stop by the office later today to pick up paperwork.

## 2024-09-10 ENCOUNTER — Ambulatory Visit

## 2024-09-10 ENCOUNTER — Telehealth: Payer: Self-pay | Admitting: Radiology

## 2024-09-10 VITALS — BP 102/68 | HR 74 | Temp 98.7°F | Ht 72.0 in | Wt 282.4 lb

## 2024-09-10 DIAGNOSIS — R42 Dizziness and giddiness: Secondary | ICD-10-CM | POA: Diagnosis not present

## 2024-09-10 DIAGNOSIS — E66812 Obesity, class 2: Secondary | ICD-10-CM

## 2024-09-10 DIAGNOSIS — R918 Other nonspecific abnormal finding of lung field: Secondary | ICD-10-CM | POA: Diagnosis not present

## 2024-09-10 DIAGNOSIS — M7989 Other specified soft tissue disorders: Secondary | ICD-10-CM | POA: Diagnosis not present

## 2024-09-10 DIAGNOSIS — Z23 Encounter for immunization: Secondary | ICD-10-CM

## 2024-09-10 DIAGNOSIS — R0609 Other forms of dyspnea: Secondary | ICD-10-CM

## 2024-09-10 DIAGNOSIS — I7121 Aneurysm of the ascending aorta, without rupture: Secondary | ICD-10-CM | POA: Diagnosis not present

## 2024-09-10 NOTE — Patient Instructions (Addendum)
 Notification of test results are managed in the following manner: If there are any recommendations or changes to the plan of care discussed in office today, we will contact you and let you know what they are. If you do not hear from us , then your results are normal/expected and you can view them through your MyChart account, or a letter will be sent to you. Thank you again for trusting us  with your care [facility].  Getting CT chest and Leg ultrasound to r/o blood clot.  Lung function test ordered.

## 2024-09-10 NOTE — Progress Notes (Signed)
 New Patient Pulmonology Office Visit   Subjective:  Patient ID: Leon Nelson, male    DOB: [birth year]  MRN: [id number]  Referred by: Norleen Lynwood ORN, MD  CC:  Chief Complaint  Patient presents with   Consult    Consult for DOE. Pt states SOB has been ongoing for 66mo now. Pt states that when he takes the trashcan to the road he has to stop 2-3 times to take a breath. Long walks require rest. No SOB at rest.  Has not gotten COVID or Flu vaccine this year.     HPI Leon Nelson is a 68 y.o. male with ascending aortic aneurysm, hypertension, fatty liver, DM 2, presents for evaluation of shortness of breath. Mother with hokum but he tested negative for the gene.  Evaluated by cardiology for dyspnea.  SOB on e even with 600 ft after taking trash cans out to road. Started 6 months ago. Occasional cough, w white/gray phlegm. H/o occasional lightheadedness started 1 year ago.  No CP, fever or chills, N/V, wheezing.   No lung issues. No fam hx of asthma. No seasonal change w breathing. No seasonal allergies.   Lung Health: Functional status: see Covid vaccine: 3.  Influenza vaccine: will give today.  Pneumonococcal vaccine: follows through PCP.  Smoking: non smoker.  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 2 dogs and cat for long time. No mold.   Eosinophil 700 in,2010.  Latest elevation at 500 2024. Cardiac PET/CT 06/2024: CT part reviewed by me: Lung parenchyma appears normal.  Some scattered calcified nodules in right lung and liver.  Small. Echo May 2025 EF 55% grade 1 DD, no significant valvular heart disease normal RV. RVSP and TRVmax normal.    Allergies: Penicillins and Sulfonamide derivatives  Current Outpatient Medications:    Accu-Chek Softclix Lancets lancets, USE AS DIRECTED TWICE DAILY, Disp: 100 each, Rfl: 3   albuterol  (VENTOLIN  HFA) 108 (90 Base) MCG/ACT inhaler, Inhale 2 puffs into  the lungs every 6 (six) hours as needed for wheezing or shortness of breath., Disp: 8 g, Rfl: 11   allopurinol  (ZYLOPRIM ) 300 MG tablet, TAKE 1 TABLET BY MOUTH DAILY, Disp: 100 tablet, Rfl: 2   amitriptyline  (ELAVIL ) 50 MG tablet, 1 - 2 tabs by mouth at bedtime for nerve pain, Disp: 60 tablet, Rfl: 5   aspirin  EC 81 MG tablet, Take 1 tablet (81 mg total) by mouth daily. Swallow whole., Disp: , Rfl:    atenolol -chlorthalidone  (TENORETIC ) 50-25 MG tablet, TAKE ONE-HALF TABLET BY MOUTH  DAILY, Disp: 50 tablet, Rfl: 2   Blood Glucose Calibration (ACCU-CHEK GUIDE CONTROL) LIQD, Use as directed once daily as needed E11.9, Disp: 1 each, Rfl: 1   Blood Glucose Monitoring Suppl (ACCU-CHEK GUIDE ME) w/Device KIT, Use as directed up to four times per day E11.9, Disp: 1 kit, Rfl: 0   Continuous Glucose Receiver (FREESTYLE LIBRE 3 READER) DEVI, Use as directed four times per day E11.9, Disp: 1 each, Rfl: 0   Continuous Glucose Sensor (FREESTYLE LIBRE 3 PLUS SENSOR) MISC, Change sensor every 15 days. E11.9, Disp: 6 each, Rfl: 3   dapagliflozin  propanediol (FARXIGA ) 10 MG TABS tablet, Take 1 tablet (10 mg total) by mouth daily before breakfast., Disp: 90 tablet, Rfl: 3   diclofenac  sodium (VOLTAREN ) 1 % GEL, Apply 2 g topically 4 (four) times daily as needed., Disp: 200 g, Rfl: 5   dicyclomine  (BENTYL ) 20 MG tablet, TAKE 1  TABLET BY MOUTH 3 TIMES  DAILY AS NEEDED, Disp: 300 tablet, Rfl: 3   gabapentin  (NEURONTIN ) 300 MG capsule, TAKE 1 CAPSULE BY MOUTH TWICE  DAILY, Disp: 200 capsule, Rfl: 1   glipiZIDE  (GLUCOTROL  XL) 10 MG 24 hr tablet, TAKE 1 TABLET BY MOUTH DAILY  WITH BREAKFAST, Disp: 100 tablet, Rfl: 3   ketoconazole (NIZORAL) 2 % shampoo, Apply 1 Application topically 3 (three) times a week., Disp: , Rfl:    levothyroxine  (SYNTHROID ) 50 MCG tablet, TAKE 1 TABLET BY MOUTH DAILY, Disp: 100 tablet, Rfl: 3   lisinopril  (ZESTRIL ) 20 MG tablet, TAKE 1 TABLET BY MOUTH DAILY, Disp: 100 tablet, Rfl: 2   metFORMIN   (GLUCOPHAGE -XR) 500 MG 24 hr tablet, TAKE 2 TABLETS BY MOUTH TWICE  DAILY WITH A MEAL (Patient taking differently: TAKE 2 TABLETS BY MOUTH TWICE  DAILY WITH A MEAL), Disp: 400 tablet, Rfl: 2   ONETOUCH ULTRA test strip, USE AS INSTRUCTED ONCE DAILY, Disp: 100 strip, Rfl: 2   potassium chloride  SA (KLOR-CON  M) 20 MEQ tablet, TAKE 2 TABLETS BY MOUTH DAILY, Disp: 200 tablet, Rfl: 2   rosuvastatin  (CRESTOR ) 40 MG tablet, TAKE 1 TABLET BY MOUTH DAILY, Disp: 90 tablet, Rfl: 3   Semaglutide , 1 MG/DOSE, 4 MG/3ML SOPN, Inject 1 mg as directed once a week., Disp: 9 mL, Rfl: 3   solifenacin  (VESICARE ) 5 MG tablet, Take 1 tablet by mouth once daily, Disp: 90 tablet, Rfl: 0   venlafaxine  XR (EFFEXOR -XR) 75 MG 24 hr capsule, TAKE 1 CAPSULE BY MOUTH DAILY, Disp: 100 capsule, Rfl: 2 Past Medical History:  Diagnosis Date   ABDOMINAL PAIN OTHER SPECIFIED SITE 07/20/2008   CHEST PAIN 02/06/2011   COLONIC POLYPS, HX OF 05/26/2008   DEPRESSION 05/26/2008   DIABETES MELLITUS, TYPE II 05/26/2008   HYPERLIPIDEMIA 05/26/2008   HYPERTENSION 05/26/2008   HYPOTHYROIDISM 05/26/2008   KNEE PAIN, RIGHT, ACUTE 07/01/2008   Personal history of urinary calculi 05/26/2008   Pure hypercholesterolemia 05/19/2024   UTI'S, HX OF 05/26/2008   Past Surgical History:  Procedure Laterality Date   s/p right knee arthroplasty      complicated by patellar tendon rupture Jan 2011 Dr. heide   TONSILLECTOMY     Family History  Problem Relation Age of Onset   Cancer Mother        breast cancer   Hyperlipidemia Mother    Heart disease Mother    Hyperlipidemia Father    Heart disease Father    Alcohol abuse Other    Diabetes Other    Social History   Socioeconomic History   Marital status: Married    Spouse name: Not on file   Number of children: Not on file   Years of education: Not on file   Highest education level: Some college, no degree  Occupational History   Not on file  Tobacco Use   Smoking status: Never    Smokeless tobacco: Never  Substance and Sexual Activity   Alcohol use: No   Drug use: No   Sexual activity: Yes  Other Topics Concern   Not on file  Social History Narrative   married   Social Drivers of Corporate investment banker Strain: Low Risk  (07/25/2024)   Overall Financial Resource Strain (CARDIA)    Difficulty of Paying Living Expenses: Not hard at all  Food Insecurity: No Food Insecurity (07/25/2024)   Hunger Vital Sign    Worried About Running Out of Food in the Last Year: Never true  Ran Out of Food in the Last Year: Never true  Transportation Needs: No Transportation Needs (07/25/2024)   PRAPARE - Administrator, Civil Service (Medical): No    Lack of Transportation (Non-Medical): No  Physical Activity: Inactive (07/25/2024)   Exercise Vital Sign    Days of Exercise per Week: 0 days    Minutes of Exercise per Session: Not on file  Stress: No Stress Concern Present (07/25/2024)   [employer] of Occupational Health - Occupational Stress Questionnaire    Feeling of Stress: Not at all  Social Connections: Socially Integrated (07/25/2024)   Social Connection and Isolation Panel    Frequency of Communication with Friends and Family: More than three times a week    Frequency of Social Gatherings with Friends and Family: Once a week    Attends Religious Services: More than 4 times per year    Active Member of [employer] or Organizations: Yes    Attends Engineer, structural: More than 4 times per year    Marital Status: Married  Catering manager Violence: Not At Risk (05/26/2024)   Humiliation, Afraid, Rape, and Kick questionnaire    Fear of Current or Ex-Partner: No    Emotionally Abused: No    Physically Abused: No    Sexually Abused: No       Objective:  BP 102/68   Pulse 74   Temp 98.7 F (37.1 C)   Ht 6' (1.829 m)   Wt 282 lb 6.4 oz (128.1 kg)   SpO2 93% Comment: RA  BMI 38.30 kg/m  BMI Readings from Last 3 Encounters:  09/10/24 38.30 kg/m   08/27/24 37.77 kg/m  08/26/24 38.00 kg/m    General: No distress elderly gentleman who appears obese. Lungs: clear to auscultation bilaterally.  Heart: regular rate rhythm, no murmur appreciated.  Abdomen: non tender, non distended. Normal BS.  Neuro: axox3.  Moving all extremities. 1+ pitting edema in bilateral lower extremity.  Per patient this is new.   Diagnostic Review:       Assessment & Plan:   Assessment & Plan DOE (dyspnea on exertion) Many differentials: Rule out PE with lightheadedness.  HFpEF versus others. -CT chest PE to rule out PE. -PFT. -If above testing are all negative I will give him a trial of Lasix to evaluate if that helps with his breathing. -Flu shot ordered. Lightheadedness Rule out PE as above. Pulmonary nodules CT/PET reviewed by me as above. Calcified nodules.  Previous granulomatous changes.  No follow-up needed.  Will evaluate entire lung parenchyma with above CT chest. Leg swelling Venous Dopplers ordered.  Rule out DVT. Obesity, class 2 On Ozempic  for DM.  Continue the same per PCP. Aneurysm of ascending aorta without rupture [facility]) Follows Cardiology.   Orders Placed This Encounter  Procedures   CT Angio Chest Pulmonary Embolism (PE) W or WO Contrast   Flu vaccine, recombinant, trivalent, inj   Pulmonary function test   VAS US  LOWER EXTREMITY VENOUS (DVT)      Return in about 2 months (around 11/10/2024).   Malanie Koloski, MD

## 2024-09-10 NOTE — Assessment & Plan Note (Signed)
[facility]

## 2024-09-10 NOTE — Telephone Encounter (Signed)
 Copied from CRM [id number]. Topic: Clinical - Medication Question >> Sep 10, 2024 12:17 PM Viola F wrote: Reason for CRM: Ellouise from [employer] drug interactions between amitriptyline  (ELAVIL ) 50 MG tablet,  dicyclomine  (BENTYL ) 20 MG tablet, and the solifenacin  (VESICARE ) 5 MG tablet can cause cognitive decline and confusion, wants to know if provider wants to switch to alternative medications. Her call back number is [phone] >> Sep 10, 2024 12:25 PM Viola F wrote: Medications can also cause falls

## 2024-09-10 NOTE — Assessment & Plan Note (Signed)
 Many differentials: Rule out PE with lightheadedness.  HFpEF versus others. -CT chest PE to rule out PE. -PFT. -If above testing are all negative I will give him a trial of Lasix to evaluate if that helps with his breathing. -Flu shot ordered.

## 2024-09-14 ENCOUNTER — Ambulatory Visit: Payer: Self-pay

## 2024-09-14 ENCOUNTER — Ambulatory Visit (HOSPITAL_COMMUNITY)
Admission: RE | Admit: 2024-09-14 | Discharge: 2024-09-14 | Disposition: A | Discharge status: Home / Self Care | Source: Ambulatory Visit | Attending: Surgery | Admitting: Surgery

## 2024-09-14 DIAGNOSIS — M7989 Other specified soft tissue disorders: Secondary | ICD-10-CM | POA: Insufficient documentation

## 2024-09-15 ENCOUNTER — Ambulatory Visit
Admission: RE | Admit: 2024-09-15 | Discharge: 2024-09-15 | Disposition: A | Discharge status: Home / Self Care | Source: Ambulatory Visit

## 2024-09-15 DIAGNOSIS — R42 Dizziness and giddiness: Secondary | ICD-10-CM

## 2024-09-15 DIAGNOSIS — R0609 Other forms of dyspnea: Secondary | ICD-10-CM

## 2024-09-15 DIAGNOSIS — J8410 Pulmonary fibrosis, unspecified: Secondary | ICD-10-CM | POA: Diagnosis not present

## 2024-09-15 DIAGNOSIS — R0602 Shortness of breath: Secondary | ICD-10-CM | POA: Diagnosis not present

## 2024-09-15 MED ORDER — IOPAMIDOL (ISOVUE-370) INJECTION 76%: 82.0000 mL | Freq: Once | INTRAVENOUS | Status: DC | PRN

## 2024-09-15 MED ORDER — IOPAMIDOL (ISOVUE-370) INJECTION 76%
75.0000 mL | Freq: Once | INTRAVENOUS | Status: AC | PRN
  Administered 2024-09-15: 75 mL via INTRAVENOUS

## 2024-09-16 NOTE — Progress Notes (Signed)
 Hi Dr Lynwood. Patient has nodular thickening of adrenal glands that was seen on the CT chest incidentally. Can you please evaluate if further testing needs to be done on this. His CT scan other is unremarkable.

## 2024-09-17 ENCOUNTER — Ambulatory Visit: Admitting: Podiatry

## 2024-09-17 VITALS — Ht 72.0 in | Wt 282.4 lb

## 2024-09-17 DIAGNOSIS — M79674 Pain in right toe(s): Secondary | ICD-10-CM

## 2024-09-17 DIAGNOSIS — E1149 Type 2 diabetes mellitus with other diabetic neurological complication: Secondary | ICD-10-CM

## 2024-09-17 DIAGNOSIS — M79675 Pain in left toe(s): Secondary | ICD-10-CM

## 2024-09-17 DIAGNOSIS — B351 Tinea unguium: Secondary | ICD-10-CM | POA: Diagnosis not present

## 2024-09-17 DIAGNOSIS — Q828 Other specified congenital malformations of skin: Secondary | ICD-10-CM

## 2024-09-18 NOTE — Progress Notes (Signed)
 Subjective: Chief Complaint  Patient presents with   Nail Problem    Rm 11 Pt is here for a f/u on bilateral onychomycosis.    68 year old male presents the office today for diabetic foot evaluation and for calluses to both of his great toes as well as the toenails he cannot trim himself.   Last A1c was 7.3 on 07/29/2024 Norleen Lynwood ORN, MD Last seen 01/29/2024  Objective: AAO x3, NAD DP/PT pulses palpable bilaterally, CRT less than 3 seconds Sensation decreased with SWMF.  Nails are hypertrophic, dystrophic, brittle, discolored, elongated 10. No surrounding redness or drainage. Tenderness nails 1-5 bilaterally.   Hyperkeratotic lesions to bilateral hallux and left submetatarsal 2.  Upon debridement no underlying ulceration, drainage or any signs of infection.  Flatfoot present bilateral.  No other open lesions No pain with calf compression, swelling, warmth, erythema  Assessment: 68 year old male with symptomatic onychomycosis, hyperkeratotic lesions; neuropathy  Plan: -All treatment options discussed with the patient including all alternatives, risks, complications.  -Nails debrided x10 without any complications or bleeding -Hyperkeratotic lesion sharply treated x 3 without complications -Continue gabapentin  -Discussed daily foot inspection, glucose control  Return in about 3 months (around 12/17/2024).  Donnice JONELLE Fees DPM

## 2024-09-21 NOTE — Telephone Encounter (Signed)
 Ok to continue all medication as prescribed for now,  no new orders

## 2024-09-23 ENCOUNTER — Encounter (HOSPITAL_BASED_OUTPATIENT_CLINIC_OR_DEPARTMENT_OTHER): Payer: Self-pay

## 2024-09-23 ENCOUNTER — Ambulatory Visit (HOSPITAL_BASED_OUTPATIENT_CLINIC_OR_DEPARTMENT_OTHER): Admitting: Orthopaedic Surgery

## 2024-09-23 ENCOUNTER — Encounter: Payer: Self-pay | Admitting: Internal Medicine

## 2024-09-23 ENCOUNTER — Ambulatory Visit (HOSPITAL_BASED_OUTPATIENT_CLINIC_OR_DEPARTMENT_OTHER): Payer: Self-pay | Admitting: Orthopaedic Surgery

## 2024-09-23 ENCOUNTER — Other Ambulatory Visit (HOSPITAL_BASED_OUTPATIENT_CLINIC_OR_DEPARTMENT_OTHER): Payer: Self-pay

## 2024-09-23 ENCOUNTER — Ambulatory Visit (INDEPENDENT_AMBULATORY_CARE_PROVIDER_SITE_OTHER): Admitting: Internal Medicine

## 2024-09-23 VITALS — BP 122/76 | HR 75 | Temp 99.3°F | Ht 72.0 in | Wt 278.0 lb

## 2024-09-23 DIAGNOSIS — E1149 Type 2 diabetes mellitus with other diabetic neurological complication: Secondary | ICD-10-CM

## 2024-09-23 DIAGNOSIS — Z7984 Long term (current) use of oral hypoglycemic drugs: Secondary | ICD-10-CM | POA: Diagnosis not present

## 2024-09-23 DIAGNOSIS — Z7985 Long-term (current) use of injectable non-insulin antidiabetic drugs: Secondary | ICD-10-CM | POA: Diagnosis not present

## 2024-09-23 DIAGNOSIS — S46011A Strain of muscle(s) and tendon(s) of the rotator cuff of right shoulder, initial encounter: Secondary | ICD-10-CM

## 2024-09-23 DIAGNOSIS — I10 Essential (primary) hypertension: Secondary | ICD-10-CM | POA: Diagnosis not present

## 2024-09-23 DIAGNOSIS — G629 Polyneuropathy, unspecified: Secondary | ICD-10-CM

## 2024-09-23 DIAGNOSIS — E559 Vitamin D deficiency, unspecified: Secondary | ICD-10-CM

## 2024-09-23 MED ORDER — ASPIRIN 325 MG PO TBEC
325.0000 mg | DELAYED_RELEASE_TABLET | Freq: Every day | ORAL | 0 refills | Status: DC
  Filled 2024-09-23: qty 14, 14d supply, fill #0

## 2024-09-23 MED ORDER — ACETAMINOPHEN 500 MG PO TABS
500.0000 mg | ORAL_TABLET | Freq: Three times a day (TID) | ORAL | 0 refills | Status: AC
  Filled 2024-09-23: qty 30, 10d supply, fill #0

## 2024-09-23 MED ORDER — IBUPROFEN 800 MG PO TABS
800.0000 mg | ORAL_TABLET | Freq: Three times a day (TID) | ORAL | 0 refills | Status: AC
  Filled 2024-09-23: qty 30, 10d supply, fill #0

## 2024-09-23 MED ORDER — OXYCODONE HCL 5 MG PO TABS
5.0000 mg | ORAL_TABLET | ORAL | 0 refills | Status: DC | PRN
  Filled 2024-09-23: qty 30, 5d supply, fill #0

## 2024-09-23 NOTE — Progress Notes (Signed)
 Chief Complaint: Right shoulder injury        History of Present Illness:    09/23/2024: Presents today for follow-up of his right shoulder.  He is still having very limited overhead active range of motion.  He is here today for MRI discussion     Leon Nelson is a 68 y.o. male who presents today for evaluation of a right shoulder injury.  He unfortunately sustained a fall in the parking lot at [employer] this morning while working on his phone and landed on his right side.  States that he does not have any pain while pressing on his arm however he is unable to lift the arm and this causes a sharp pain.  Denies any numbness or tingling.  He is right-hand dominant.  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     Surgical History:   None   PMH/PSH/Family History/Social History/Meds/Allergies:         Past Medical History:  Diagnosis Date   ABDOMINAL PAIN OTHER SPECIFIED SITE 07/20/2008   CHEST PAIN 02/06/2011   COLONIC POLYPS, HX OF 05/26/2008   DEPRESSION 05/26/2008   DIABETES MELLITUS, TYPE II 05/26/2008   HYPERLIPIDEMIA 05/26/2008   HYPERTENSION 05/26/2008   HYPOTHYROIDISM 05/26/2008   KNEE PAIN, RIGHT, ACUTE 07/01/2008   Personal history of urinary calculi 05/26/2008   Pure hypercholesterolemia 05/19/2024   UTI'S, HX OF 05/26/2008             Past Surgical History:  Procedure Laterality Date   s/p right knee arthroplasty         complicated by patellar tendon rupture Jan 2011 Dr. heide   TONSILLECTOMY            Social History         Socioeconomic History   Marital status: Married      Spouse name: Not on file   Number of children: Not on file   Years of education: Not on file   Highest education level: Some college, no degree  Occupational History   Not on file  Tobacco Use   Smoking status: Never   Smokeless tobacco: Never  Substance and Sexual Activity   Alcohol use: No   Drug use: No   Sexual activity: Yes  Other Topics  Concern   Not on file  Social History Narrative    married    Social Drivers of Acupuncturist Strain: Low Risk  (07/25/2024)    Overall Financial Resource Strain (CARDIA)     Difficulty of Paying Living Expenses: Not hard at all  Food Insecurity: No Food Insecurity (07/25/2024)    Hunger Vital Sign     Worried About Running Out of Food in the Last Year: Never true     Ran Out of Food in the Last Year: Never true  Transportation Needs: No Transportation Needs (07/25/2024)    PRAPARE - Therapist, art (Medical): No     Lack of Transportation (Non-Medical): No  Physical Activity: Inactive (07/25/2024)    Exercise Vital Sign     Days of Exercise per Week: 0 days     Minutes of Exercise per Session: Not on file  Stress: No Stress Concern Present (07/25/2024)    [employer] of Occupational Health - Occupational Stress Questionnaire     Feeling of Stress: Not at all  Social Connections: Socially Integrated (07/25/2024)    Social Connection  and Isolation Panel     Frequency of Communication with Friends and Family: More than three times a week     Frequency of Social Gatherings with Friends and Family: Once a week     Attends Religious Services: More than 4 times per year     Active Member of [employer] or Organizations: Yes     Attends Engineer, structural: More than 4 times per year     Marital Status: Married         Family History  Problem Relation Age of Onset   Cancer Mother          breast cancer   Hyperlipidemia Mother     Heart disease Mother     Hyperlipidemia Father     Heart disease Father     Alcohol abuse Other     Diabetes Other          Allergies       Allergies  Allergen Reactions   Penicillins Other (See Comments)      Blisters on hands   Sulfonamide Derivatives Other (See Comments)      Blisters in groin area            Current Outpatient Medications  Medication Sig Dispense Refill   Accu-Chek Softclix  Lancets lancets USE AS DIRECTED TWICE DAILY 100 each 3   albuterol  (VENTOLIN  HFA) 108 (90 Base) MCG/ACT inhaler Inhale 2 puffs into the lungs every 6 (six) hours as needed for wheezing or shortness of breath. 8 g 11   allopurinol  (ZYLOPRIM ) 300 MG tablet TAKE 1 TABLET BY MOUTH DAILY 100 tablet 2   amitriptyline  (ELAVIL ) 50 MG tablet 1 - 2 tabs by mouth at bedtime for nerve pain 60 tablet 5   aspirin  EC 81 MG tablet Take 1 tablet (81 mg total) by mouth daily. Swallow whole.       atenolol -chlorthalidone  (TENORETIC ) 50-25 MG tablet TAKE ONE-HALF TABLET BY MOUTH  DAILY 50 tablet 2   Blood Glucose Calibration (ACCU-CHEK GUIDE CONTROL) LIQD Use as directed once daily as needed E11.9 1 each 1   Blood Glucose Monitoring Suppl (ACCU-CHEK GUIDE ME) w/Device KIT Use as directed up to four times per day E11.9 1 kit 0   Continuous Glucose Receiver (FREESTYLE LIBRE 3 READER) DEVI Use as directed four times per day E11.9 1 each 0   Continuous Glucose Sensor (FREESTYLE LIBRE 3 PLUS SENSOR) MISC Change sensor every 15 days. E11.9 6 each 3   dapagliflozin  propanediol (FARXIGA ) 10 MG TABS tablet Take 1 tablet (10 mg total) by mouth daily before breakfast. 90 tablet 3   diclofenac  sodium (VOLTAREN ) 1 % GEL Apply 2 g topically 4 (four) times daily as needed. 200 g 5   dicyclomine  (BENTYL ) 20 MG tablet TAKE 1 TABLET BY MOUTH 3 TIMES  DAILY AS NEEDED 300 tablet 3   gabapentin  (NEURONTIN ) 300 MG capsule TAKE 1 CAPSULE BY MOUTH TWICE  DAILY 200 capsule 1   glipiZIDE  (GLUCOTROL  XL) 10 MG 24 hr tablet TAKE 1 TABLET BY MOUTH DAILY  WITH BREAKFAST 100 tablet 3   ketoconazole (NIZORAL) 2 % shampoo Apply 1 Application topically 3 (three) times a week.       levothyroxine  (SYNTHROID ) 50 MCG tablet TAKE 1 TABLET BY MOUTH DAILY 100 tablet 3   lisinopril  (ZESTRIL ) 20 MG tablet TAKE 1 TABLET BY MOUTH DAILY 100 tablet 2   metFORMIN  (GLUCOPHAGE -XR) 500 MG 24 hr tablet TAKE 2 TABLETS BY MOUTH TWICE  DAILY WITH  A MEAL 400 tablet 2    ONETOUCH ULTRA test strip USE AS INSTRUCTED ONCE DAILY 100 strip 2   potassium chloride  SA (KLOR-CON  M) 20 MEQ tablet TAKE 2 TABLETS BY MOUTH DAILY 200 tablet 2   rosuvastatin  (CRESTOR ) 40 MG tablet TAKE 1 TABLET BY MOUTH DAILY 90 tablet 3   Semaglutide , 1 MG/DOSE, 4 MG/3ML SOPN Inject 1 mg as directed once a week. 9 mL 3   solifenacin  (VESICARE ) 5 MG tablet Take 1 tablet by mouth once daily 90 tablet 0   venlafaxine  XR (EFFEXOR -XR) 75 MG 24 hr capsule TAKE 1 CAPSULE BY MOUTH DAILY 100 capsule 2      No current facility-administered medications for this visit.      Imaging Results (Last 48 hours)  No results found.     Review of Systems:   A ROS was performed including pertinent positives and negatives as documented in the HPI.   Physical Exam :   Constitutional: NAD and appears stated age Neurological: Alert and oriented Psych: Appropriate affect and cooperative There were no vitals taken for this visit.    Comprehensive Musculoskeletal Exam:     Exam remains unchanged.  No significant tenderness or ecchymosis of the right shoulder.  Patient is only able to perform 30 degrees of active forward flexion and 20 degrees of external rotation, compared to 160/30 on contralateral side.  Distal motor and neurosensory exam is intact.  Radial pulse 2+.   Imaging:   MRI right shoulder: Complete tears of the supraspinatus and infraspinatus with retraction.  Likely partial tears of the biceps long head and infraspinatus tendons.  No evidence of fracture.     I personally reviewed and interpreted the radiographs.     Assessment:   68 y.o. male with a recent fall onto his right shoulder.  MRI confirms complete supraspinatus and infraspinatus tears.  Patient is still a moderate amount of discomfort and is unable to perform much motion of the shoulder particularly with flexion.  I did go ahead and discuss the outlook of nonoperative treatment which would ultimately need to decrease pain however  function would remain very limited.  At this time given the acuity of the injury we did discuss treatment options.  I do not believe that a rotator cuff repair would lead to a very viable function given the status of the tendon and the significant retraction.  Given this we did discuss reverse shoulder arthroplasty.  I did discuss the risks and benefits as well as limitations.  After discussion he would like to proceed   Plan :     - Plan for right shoulder reverse shoulder arthroplasty   After a lengthy discussion of treatment options, including risks, benefits, alternatives, complications of surgical and nonsurgical conservative options, the patient elected surgical repair.   The patient  is aware of the material risks  and complications including, but not limited to injury to adjacent structures, neurovascular injury, infection, numbness, bleeding, implant failure, thermal burns, stiffness, persistent pain, failure to heal, disease transmission from allograft, need for further surgery, dislocation, anesthetic risks, blood clots, risks of death,and others. The probabilities of surgical success and failure discussed with patient given their particular co-morbidities.The time and nature of expected rehabilitation and recovery was discussed.The patient's questions were all answered preoperatively.  No barriers to understanding were noted. I explained the natural history of the disease process and Rx rationale.  I explained to the patient what I considered to be reasonable expectations given their personal  situation.  The final treatment plan was arrived at through a shared patient decision making process model.          I personally saw and evaluated the patient, and participated in the management and treatment plan.

## 2024-09-23 NOTE — Progress Notes (Signed)
 Patient ID: Leon Nelson, male   DOB: [birth year], 68 y.o.   MRN: [id number]        Chief Complaint: follow up peripheral neuropathy, ascending aortic aneurysm, htn, low vit d, dm       HPI:  Leon Nelson is a 68 y.o. male here with c/o ongoing recurrent flares of neuropathy pain to the legs below the knees that require him to stand up at times to seem to help.  No worsening lower back pain.  Needs work FMLA filled out with at least 7 visits for medical appts every 3 months.    Pt denies chest pain, increased sob or doe, wheezing, orthopnea, PND, increased LE swelling, palpitations, dizziness or syncope.   Pt denies polydipsia, polyuria, or new focal neuro s/s.    Pt denies fever, wt loss, night sweats, loss of appetite, or other constitutional symptoms  Pt scheduled for right shoulder reverse replacement soon.  Did have recent CTA chest with stable aortic aneuyrsm       Wt Readings from Last 3 Encounters:  09/23/24 278 lb (126.1 kg)  09/17/24 282 lb 6.4 oz (128.1 kg)  09/10/24 282 lb 6.4 oz (128.1 kg)   BP Readings from Last 3 Encounters:  09/23/24 122/76  09/10/24 102/68  08/27/24 106/80         Past Medical History:  Diagnosis Date   ABDOMINAL PAIN OTHER SPECIFIED SITE 07/20/2008   CHEST PAIN 02/06/2011   COLONIC POLYPS, HX OF 05/26/2008   DEPRESSION 05/26/2008   DIABETES MELLITUS, TYPE II 05/26/2008   HYPERLIPIDEMIA 05/26/2008   HYPERTENSION 05/26/2008   HYPOTHYROIDISM 05/26/2008   KNEE PAIN, RIGHT, ACUTE 07/01/2008   Personal history of urinary calculi 05/26/2008   Pure hypercholesterolemia 05/19/2024   UTI'S, HX OF 05/26/2008   Past Surgical History:  Procedure Laterality Date   s/p right knee arthroplasty      complicated by patellar tendon rupture Jan 2011 Dr. heide   TONSILLECTOMY      reports that he has never smoked. He has never used smokeless tobacco. He reports that he does not drink alcohol and does not use drugs. family history includes Alcohol abuse in  an other family member; Cancer in his mother; Diabetes in an other family member; Heart disease in his father and mother; Hyperlipidemia in his father and mother. Allergies  Allergen Reactions   Penicillins Other (See Comments)    Blisters on hands   Sulfonamide Derivatives Other (See Comments)    Blisters in groin area   Current Outpatient Medications on File Prior to Visit  Medication Sig Dispense Refill   Accu-Chek Softclix Lancets lancets USE AS DIRECTED TWICE DAILY 100 each 3   acetaminophen  (TYLENOL ) 500 MG tablet Take 1 tablet (500 mg total) by mouth every 8 (eight) hours for 10 days. 30 tablet 0   albuterol  (VENTOLIN  HFA) 108 (90 Base) MCG/ACT inhaler Inhale 2 puffs into the lungs every 6 (six) hours as needed for wheezing or shortness of breath. 8 g 11   allopurinol  (ZYLOPRIM ) 300 MG tablet TAKE 1 TABLET BY MOUTH DAILY 100 tablet 2   amitriptyline  (ELAVIL ) 50 MG tablet 1 - 2 tabs by mouth at bedtime for nerve pain 60 tablet 5   aspirin  EC 325 MG tablet Take 1 tablet (325 mg total) by mouth daily. 14 tablet 0   aspirin  EC 81 MG tablet Take 1 tablet (81 mg total) by mouth daily. Swallow whole.     atenolol -chlorthalidone  (TENORETIC ) 50-25 MG tablet TAKE  ONE-HALF TABLET BY MOUTH  DAILY 50 tablet 2   Blood Glucose Calibration (ACCU-CHEK GUIDE CONTROL) LIQD Use as directed once daily as needed E11.9 1 each 1   Blood Glucose Monitoring Suppl (ACCU-CHEK GUIDE ME) w/Device KIT Use as directed up to four times per day E11.9 1 kit 0   Continuous Glucose Receiver (FREESTYLE LIBRE 3 READER) DEVI Use as directed four times per day E11.9 1 each 0   Continuous Glucose Sensor (FREESTYLE LIBRE 3 PLUS SENSOR) MISC Change sensor every 15 days. E11.9 6 each 3   dapagliflozin  propanediol (FARXIGA ) 10 MG TABS tablet Take 1 tablet (10 mg total) by mouth daily before breakfast. 90 tablet 3   diclofenac  sodium (VOLTAREN ) 1 % GEL Apply 2 g topically 4 (four) times daily as needed. 200 g 5   dicyclomine   (BENTYL ) 20 MG tablet TAKE 1 TABLET BY MOUTH 3 TIMES  DAILY AS NEEDED 300 tablet 3   gabapentin  (NEURONTIN ) 300 MG capsule TAKE 1 CAPSULE BY MOUTH TWICE  DAILY 200 capsule 1   glipiZIDE  (GLUCOTROL  XL) 10 MG 24 hr tablet TAKE 1 TABLET BY MOUTH DAILY  WITH BREAKFAST 100 tablet 3   ibuprofen (ADVIL) 800 MG tablet Take 1 tablet (800 mg total) by mouth every 8 (eight) hours for 10 days. Please take with food, please alternate with acetaminophen  30 tablet 0   ketoconazole (NIZORAL) 2 % shampoo Apply 1 Application topically 3 (three) times a week.     levothyroxine  (SYNTHROID ) 50 MCG tablet TAKE 1 TABLET BY MOUTH DAILY 100 tablet 3   lisinopril  (ZESTRIL ) 20 MG tablet TAKE 1 TABLET BY MOUTH DAILY 100 tablet 2   metFORMIN  (GLUCOPHAGE -XR) 500 MG 24 hr tablet TAKE 2 TABLETS BY MOUTH TWICE  DAILY WITH A MEAL (Patient taking differently: No sig reported) 400 tablet 2   oxyCODONE  (ROXICODONE ) 5 MG immediate release tablet Take 1 tablet (5 mg total) by mouth every 4 (four) hours as needed for severe pain (pain score 7-10) or breakthrough pain. 30 tablet 0   potassium chloride  SA (KLOR-CON  M) 20 MEQ tablet TAKE 2 TABLETS BY MOUTH DAILY 200 tablet 2   rosuvastatin  (CRESTOR ) 40 MG tablet TAKE 1 TABLET BY MOUTH DAILY 90 tablet 3   Semaglutide , 1 MG/DOSE, 4 MG/3ML SOPN Inject 1 mg as directed once a week. 9 mL 3   solifenacin  (VESICARE ) 5 MG tablet Take 1 tablet by mouth once daily 90 tablet 0   venlafaxine  XR (EFFEXOR -XR) 75 MG 24 hr capsule TAKE 1 CAPSULE BY MOUTH DAILY 100 capsule 2   No current facility-administered medications on file prior to visit.        ROS:  All others reviewed and negative.  Objective        PE:  BP 122/76 (BP Location: Right Arm, Patient Position: Sitting, Cuff Size: Normal)   Pulse 75   Temp 99.3 F (37.4 C) (Oral)   Ht 6' (1.829 m)   Wt 278 lb (126.1 kg)   SpO2 97%   BMI 37.70 kg/m                 Constitutional: Pt appears in NAD               HENT: Head: NCAT.                 Right Ear: External ear normal.                 Left Ear: External ear normal.  Eyes: . Pupils are equal, round, and reactive to light. Conjunctivae and EOM are normal               Nose: without d/c or deformity               Neck: Neck supple. Gross normal ROM               Cardiovascular: Normal rate and regular rhythm.                 Pulmonary/Chest: Effort normal and breath sounds without rales or wheezing.                Abd:  Soft, NT, ND, + BS, no organomegaly               Neurological: Pt is alert. At baseline orientation, motor grossly intact               Skin: Skin is warm. No rashes, no other new lesions, LE edema - none               Psychiatric: Pt behavior is normal without agitation   Micro: none  Cardiac tracings I have personally interpreted today:  none  Pertinent Radiological findings (summarize): none   Lab Results  Component Value Date   WBC 7.8 05/19/2024   HGB 15.1 05/19/2024   HCT 45.2 05/19/2024   PLT 219.0 05/19/2024   GLUCOSE 169 (H) 07/29/2024   CHOL 105 07/29/2024   TRIG 83.0 07/29/2024   HDL 40.90 07/29/2024   LDLDIRECT 72.0 07/17/2022   LDLCALC 48 07/29/2024   ALT 27 07/29/2024   AST 20 07/29/2024   NA 142 07/29/2024   K 4.6 07/29/2024   CL 104 07/29/2024   CREATININE 0.87 07/29/2024   BUN 22 07/29/2024   CO2 31 07/29/2024   TSH 3.52 01/29/2024   PSA 0.10 01/29/2024   INR 1.0 09/13/2023   HGBA1C 7.3 (H) 07/29/2024   MICROALBUR 0.7 02/17/2009   Assessment/Plan:  Leon Nelson is a 68 y.o. White or Caucasian [1] male with  has a past medical history of ABDOMINAL PAIN OTHER SPECIFIED SITE (07/20/2008), CHEST PAIN (02/06/2011), COLONIC POLYPS, HX OF (05/26/2008), DEPRESSION (05/26/2008), DIABETES MELLITUS, TYPE II (05/26/2008), HYPERLIPIDEMIA (05/26/2008), HYPERTENSION (05/26/2008), HYPOTHYROIDISM (05/26/2008), KNEE PAIN, RIGHT, ACUTE (07/01/2008), Personal history of urinary calculi (05/26/2008), Pure  hypercholesterolemia (05/19/2024), and UTI'S, HX OF (05/26/2008).  Vitamin D  deficiency Last vitamin D  Lab Results  Component Value Date   VD25OH 72.07 07/29/2024   Stable, cont oral replacement   Neuropathy Chronic stable, but needs FMLA filled out for ongoing care, cont current med tx  Essential hypertension BP Readings from Last 3 Encounters:  09/23/24 122/76  09/10/24 102/68  08/27/24 106/80   Stable, pt to continue medical treatment tenoretic  50 25 every day, lsinopril 20 qd   Diabetes mellitus type 2 with neurological manifestations (HCC) Lab Results  Component Value Date   HGBA1C 7.3 (H) 07/29/2024   uncontrolled, pt to continue current medical treatment farxiga  10 mg every day, but also increased ozempic  to 1 mg weekly, and continue glucotrol  xl 10 every day, metformin  ER 500 mg - 2 bid  Followup: Return in about 4 months (around 01/29/2025), or if symptoms worsen or fail to improve.  Lynwood Rush, MD 09/23/2024 9:16 PM [facility]

## 2024-09-23 NOTE — Assessment & Plan Note (Signed)
 Lab Results  Component Value Date   HGBA1C 7.3 (H) 07/29/2024   uncontrolled, pt to continue current medical treatment farxiga  10 mg every day, but also increased ozempic  to 1 mg weekly, and continue glucotrol  xl 10 every day, metformin  ER 500 mg - 2 bid

## 2024-09-23 NOTE — Assessment & Plan Note (Signed)
 BP Readings from Last 3 Encounters:  09/23/24 122/76  09/10/24 102/68  08/27/24 106/80   Stable, pt to continue medical treatment tenoretic  50 25 every day, lsinopril 20 qd

## 2024-09-23 NOTE — Patient Instructions (Signed)
 Please continue all other medications as before, except to stop the amytriptilene due to dry mouth  Please have the pharmacy call with any other refills you may need.  Please continue your efforts at being more active, low cholesterol diet, and weight control.  You are otherwise up to date with prevention measures today.  Please keep your appointments with your specialists as you may have planned  Your paperwork will be finished

## 2024-09-23 NOTE — Assessment & Plan Note (Signed)
 Chronic stable, but needs FMLA filled out for ongoing care, cont current med tx

## 2024-09-23 NOTE — Assessment & Plan Note (Signed)
 Last vitamin D  Lab Results  Component Value Date   VD25OH 72.07 07/29/2024   Stable, cont oral replacement

## 2024-09-25 ENCOUNTER — Encounter: Payer: Self-pay | Admitting: Pharmacist

## 2024-09-30 ENCOUNTER — Ambulatory Visit (HOSPITAL_BASED_OUTPATIENT_CLINIC_OR_DEPARTMENT_OTHER)
Admission: RE | Admit: 2024-09-30 | Discharge: 2024-09-30 | Disposition: A | Discharge status: Home / Self Care | Source: Ambulatory Visit | Attending: Orthopaedic Surgery | Admitting: Orthopaedic Surgery

## 2024-09-30 DIAGNOSIS — S46011A Strain of muscle(s) and tendon(s) of the rotator cuff of right shoulder, initial encounter: Secondary | ICD-10-CM | POA: Diagnosis not present

## 2024-09-30 DIAGNOSIS — M75101 Unspecified rotator cuff tear or rupture of right shoulder, not specified as traumatic: Secondary | ICD-10-CM | POA: Diagnosis not present

## 2024-09-30 DIAGNOSIS — M19011 Primary osteoarthritis, right shoulder: Secondary | ICD-10-CM | POA: Diagnosis not present

## 2024-09-30 NOTE — Telephone Encounter (Unsigned)
 Copied from CRM #[id number]. Topic: General - Other >> Sep 30, 2024  8:39 AM Aleatha C wrote: Reason for CRM: Patient called to check on his Family Intermediate Form paperwork that he needed to be Dr Norleen to sign off, he will like a call today when the paperwork is signed so he can picked them up today, Please call him at [phone] when ready for pick up

## 2024-09-30 NOTE — Telephone Encounter (Signed)
 Called and let Pt know paperwork is ready for pickup.

## 2024-10-01 ENCOUNTER — Telehealth: Payer: Self-pay

## 2024-10-01 NOTE — Telephone Encounter (Signed)
   Pre-operative Risk Assessment    Patient Name: Leon Nelson  DOB: 02-May-1956 MRN: [id number]   Date of last office visit: 08/27/24 ROSALINE BANE, NP Date of next office visit: NONE   Request for Surgical Clearance    Procedure:  RIGHT REVERSE SHOULDER ARTHROPLASTY  Date of Surgery:  Clearance TBD                                Surgeon:  ELSPETH PARKER, MD Surgeon's Group or Practice Name:  [facility] Phone number:  [phone] Fax number:  [phone]   Type of Clearance Requested:   - Medical  - Pharmacy:  Hold Aspirin      Type of Anesthesia:  General    Additional requests/questions:    SignedLucie DELENA Ku   10/01/2024, 2:30 PM

## 2024-10-01 NOTE — Telephone Encounter (Signed)
 Leon Nelson,  Leon Nelson was recently seen by you in clinic.  He was stable from a cardiac standpoint.  Follow-up was planned for 1 year.  His PMH includes HLD, HTN, ascending aortic dilation, CAD (calcium  scoring 3/25 showed CAC score of 692).  Would you be able to comment on cardiac risk for upcoming procedure?  Thank you for your help.  Please direct your response to CV DIV preop pool.  Josefa HERO. Cathyrn Deas NP-C     10/01/2024, 3:44 PM Dr[facility] [address] [place], [place] [address] Office [phone]

## 2024-10-01 NOTE — Telephone Encounter (Signed)
   Primary Cardiologist: None  Chart reviewed as part of pre-operative protocol coverage. Given past medical history and time since last visit, based on ACC/AHA guidelines, Leon Nelson would be at acceptable risk for the planned procedure without further cardiovascular testing.   Patient should contact our office if he is having new symptoms that are concerning from a cardiac perspective to arrange a follow-up appointment.    Per office protocol, he may hold aspirin  for 7 days prior to procedure and should resume as soon as hemodynamically stable postoperatively.  I will route this recommendation to the requesting party via Epic fax function and remove from pre-op pool.  Please call with questions.  Rosaline EMERSON Bane, NP-C  10/01/2024, 3:59 PM [address], [address] [place], [place] [address] Office [phone] Fax [phone]

## 2024-10-02 ENCOUNTER — Encounter (HOSPITAL_BASED_OUTPATIENT_CLINIC_OR_DEPARTMENT_OTHER): Payer: Self-pay | Admitting: Orthopaedic Surgery

## 2024-10-07 ENCOUNTER — Encounter: Payer: Self-pay | Admitting: Internal Medicine

## 2024-10-07 ENCOUNTER — Ambulatory Visit (HOSPITAL_BASED_OUTPATIENT_CLINIC_OR_DEPARTMENT_OTHER)

## 2024-10-07 ENCOUNTER — Ambulatory Visit (HOSPITAL_BASED_OUTPATIENT_CLINIC_OR_DEPARTMENT_OTHER): Admitting: Student

## 2024-10-07 DIAGNOSIS — G8929 Other chronic pain: Secondary | ICD-10-CM

## 2024-10-07 DIAGNOSIS — M25511 Pain in right shoulder: Secondary | ICD-10-CM

## 2024-10-07 DIAGNOSIS — G43109 Migraine with aura, not intractable, without status migrainosus: Secondary | ICD-10-CM | POA: Diagnosis not present

## 2024-10-07 DIAGNOSIS — E119 Type 2 diabetes mellitus without complications: Secondary | ICD-10-CM | POA: Diagnosis not present

## 2024-10-07 DIAGNOSIS — H43393 Other vitreous opacities, bilateral: Secondary | ICD-10-CM | POA: Diagnosis not present

## 2024-10-07 DIAGNOSIS — S46011A Strain of muscle(s) and tendon(s) of the rotator cuff of right shoulder, initial encounter: Secondary | ICD-10-CM | POA: Diagnosis not present

## 2024-10-07 DIAGNOSIS — H2513 Age-related nuclear cataract, bilateral: Secondary | ICD-10-CM | POA: Diagnosis not present

## 2024-10-07 DIAGNOSIS — M19011 Primary osteoarthritis, right shoulder: Secondary | ICD-10-CM | POA: Diagnosis not present

## 2024-10-07 LAB — OPHTHALMOLOGY REPORT-SCANNED

## 2024-10-07 NOTE — Progress Notes (Signed)
 Chief Complaint: Right shoulder injury     History of Present Illness:   10/07/24: Patient presents to clinic today for follow-up evaluation of his right shoulder.  He was last seen in clinic on 10/1 with Dr. Genelle and is currently awaiting scheduling for a reverse shoulder arthroplasty.  During CT scan for 3D recons on 10/8 he felt a significant pop in his shoulder that caused severe pain.  Since this, he explains that his shoulder has been more painful and sore although his range of motion has improved and he is able to lift his arm overhead.   Leon Nelson is a 68 y.o. male who presents today for evaluation of a right shoulder injury.  He unfortunately sustained a fall in the parking lot at [employer] this morning while working on his phone and landed on his right side.  States that he does not have any pain while pressing on his arm however he is unable to lift the arm and this causes a sharp pain.  Denies any numbness or tingling.  He is right-hand dominant.  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   Surgical History:   None  PMH/PSH/Family History/Social History/Meds/Allergies:    Past Medical History:  Diagnosis Date   ABDOMINAL PAIN OTHER SPECIFIED SITE 07/20/2008   CHEST PAIN 02/06/2011   COLONIC POLYPS, HX OF 05/26/2008   DEPRESSION 05/26/2008   DIABETES MELLITUS, TYPE II 05/26/2008   HYPERLIPIDEMIA 05/26/2008   HYPERTENSION 05/26/2008   HYPOTHYROIDISM 05/26/2008   KNEE PAIN, RIGHT, ACUTE 07/01/2008   Personal history of urinary calculi 05/26/2008   Pure hypercholesterolemia 05/19/2024   UTI'S, HX OF 05/26/2008   Past Surgical History:  Procedure Laterality Date   s/p right knee arthroplasty      complicated by patellar tendon rupture Jan 2011 Dr. heide   TONSILLECTOMY     Social History   Socioeconomic History   Marital status: Married    Spouse name: Not on file   Number of children: Not on file   Years of education:  Not on file   Highest education level: Some college, no degree  Occupational History   Not on file  Tobacco Use   Smoking status: Never   Smokeless tobacco: Never  Substance and Sexual Activity   Alcohol use: No   Drug use: No   Sexual activity: Yes  Other Topics Concern   Not on file  Social History Narrative   married   Social Drivers of Corporate investment banker Strain: Low Risk  (07/25/2024)   Overall Financial Resource Strain (CARDIA)    Difficulty of Paying Living Expenses: Not hard at all  Food Insecurity: No Food Insecurity (07/25/2024)   Hunger Vital Sign    Worried About Running Out of Food in the Last Year: Never true    Ran Out of Food in the Last Year: Never true  Transportation Needs: No Transportation Needs (07/25/2024)   PRAPARE - Administrator, Civil Service (Medical): No    Lack of Transportation (Non-Medical): No  Physical Activity: Inactive (07/25/2024)   Exercise Vital Sign    Days of Exercise per Week: 0 days    Minutes of Exercise per Session: Not on file  Stress: No Stress Concern Present (07/25/2024)   [employer] of Occupational Health - Occupational  Stress Questionnaire    Feeling of Stress: Not at all  Social Connections: Socially Integrated (07/25/2024)   Social Connection and Isolation Panel    Frequency of Communication with Friends and Family: More than three times a week    Frequency of Social Gatherings with Friends and Family: Once a week    Attends Religious Services: More than 4 times per year    Active Member of [employer] or Organizations: Yes    Attends Engineer, structural: More than 4 times per year    Marital Status: Married   Family History  Problem Relation Age of Onset   Cancer Mother        breast cancer   Hyperlipidemia Mother    Heart disease Mother    Hyperlipidemia Father    Heart disease Father    Alcohol abuse Other    Diabetes Other    Allergies  Allergen Reactions   Penicillins Other (See  Comments)    Blisters on hands   Sulfonamide Derivatives Other (See Comments)    Blisters in groin area   Current Outpatient Medications  Medication Sig Dispense Refill   Accu-Chek Softclix Lancets lancets USE AS DIRECTED TWICE DAILY 100 each 3   albuterol  (VENTOLIN  HFA) 108 (90 Base) MCG/ACT inhaler Inhale 2 puffs into the lungs every 6 (six) hours as needed for wheezing or shortness of breath. 8 g 11   allopurinol  (ZYLOPRIM ) 300 MG tablet TAKE 1 TABLET BY MOUTH DAILY 100 tablet 2   amitriptyline  (ELAVIL ) 50 MG tablet 1 - 2 tabs by mouth at bedtime for nerve pain 60 tablet 5   aspirin  EC 325 MG tablet Take 1 tablet (325 mg total) by mouth daily. 14 tablet 0   aspirin  EC 81 MG tablet Take 1 tablet (81 mg total) by mouth daily. Swallow whole.     atenolol -chlorthalidone  (TENORETIC ) 50-25 MG tablet TAKE ONE-HALF TABLET BY MOUTH  DAILY 50 tablet 2   Blood Glucose Calibration (ACCU-CHEK GUIDE CONTROL) LIQD Use as directed once daily as needed E11.9 1 each 1   Blood Glucose Monitoring Suppl (ACCU-CHEK GUIDE ME) w/Device KIT Use as directed up to four times per day E11.9 1 kit 0   Continuous Glucose Receiver (FREESTYLE LIBRE 3 READER) DEVI Use as directed four times per day E11.9 1 each 0   Continuous Glucose Sensor (FREESTYLE LIBRE 3 PLUS SENSOR) MISC Change sensor every 15 days. E11.9 6 each 3   dapagliflozin  propanediol (FARXIGA ) 10 MG TABS tablet Take 1 tablet (10 mg total) by mouth daily before breakfast. 90 tablet 3   diclofenac  sodium (VOLTAREN ) 1 % GEL Apply 2 g topically 4 (four) times daily as needed. 200 g 5   dicyclomine  (BENTYL ) 20 MG tablet TAKE 1 TABLET BY MOUTH 3 TIMES  DAILY AS NEEDED 300 tablet 3   gabapentin  (NEURONTIN ) 300 MG capsule TAKE 1 CAPSULE BY MOUTH TWICE  DAILY 200 capsule 1   glipiZIDE  (GLUCOTROL  XL) 10 MG 24 hr tablet TAKE 1 TABLET BY MOUTH DAILY  WITH BREAKFAST 100 tablet 3   ketoconazole (NIZORAL) 2 % shampoo Apply 1 Application topically 3 (three) times a week.      levothyroxine  (SYNTHROID ) 50 MCG tablet TAKE 1 TABLET BY MOUTH DAILY 100 tablet 3   lisinopril  (ZESTRIL ) 20 MG tablet TAKE 1 TABLET BY MOUTH DAILY 100 tablet 2   metFORMIN  (GLUCOPHAGE -XR) 500 MG 24 hr tablet TAKE 2 TABLETS BY MOUTH TWICE  DAILY WITH A MEAL (Patient taking differently: No sig reported)  400 tablet 2   oxyCODONE  (ROXICODONE ) 5 MG immediate release tablet Take 1 tablet (5 mg total) by mouth every 4 (four) hours as needed for severe pain (pain score 7-10) or breakthrough pain. 30 tablet 0   potassium chloride  SA (KLOR-CON  M) 20 MEQ tablet TAKE 2 TABLETS BY MOUTH DAILY 200 tablet 2   rosuvastatin  (CRESTOR ) 40 MG tablet TAKE 1 TABLET BY MOUTH DAILY 90 tablet 3   Semaglutide , 1 MG/DOSE, 4 MG/3ML SOPN Inject 1 mg as directed once a week. 9 mL 3   solifenacin  (VESICARE ) 5 MG tablet Take 1 tablet by mouth once daily 90 tablet 0   venlafaxine  XR (EFFEXOR -XR) 75 MG 24 hr capsule TAKE 1 CAPSULE BY MOUTH DAILY 100 capsule 2   No current facility-administered medications for this visit.   No results found.  Review of Systems:   A ROS was performed including pertinent positives and negatives as documented in the HPI.  Physical Exam :   Constitutional: NAD and appears stated age Neurological: Alert and oriented Psych: Appropriate affect and cooperative There were no vitals taken for this visit.   Comprehensive Musculoskeletal Exam:    Exam of the right shoulder demonstrates no visible deformity or ecchymosis.  No significant tenderness with palpation throughout.  Patient is able to actively forward elevate to 160 degrees through discomfort.  Distal motor and neurosensory exam is intact.  Imaging:   X-ray right shoulder 3 view: No evidence of acute bony abnormality or significant changes from last radiographs on 9/4   I personally reviewed and interpreted the radiographs.   Assessment:   68 y.o. 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  He is being scheduled for reverse shoulder arthroplasty as discussed with Dr. Genelle 2 weeks ago.  He recently felt a significant pop in the shoulder and as a result has had increased pain levels although he now states that his range of motion has improved.  Discussed that I would be suspicious for a proximal long head biceps rupture which could be responsible for symptoms however would be unable to identify with complete certainty without repeat imaging.  I do not believe that this will change his long-term outlook or current plan to move forward with surgery and patient is agreeable to this.  Plan :    - Continue with right reverse shoulder arthroplasty as planned     I personally saw and evaluated the patient, and participated in the management and treatment plan.  Leonce Reveal, PA-C Orthopedics

## 2024-10-19 ENCOUNTER — Encounter (HOSPITAL_BASED_OUTPATIENT_CLINIC_OR_DEPARTMENT_OTHER): Payer: Self-pay

## 2024-10-19 ENCOUNTER — Telehealth: Payer: Self-pay

## 2024-10-19 NOTE — Telephone Encounter (Signed)
 Copied from CRM [id number]. Topic: Appointments - Scheduling Inquiry for Clinic >> Oct 19, 2024  2:25 PM Suzen RAMAN wrote: Reason for CRM: patient awaiting surgical clearance from Dr. Norleen; patient would like to know if he needs to have an additional appointment for this since he was seen on 09/23/24. Patient has also been unsuccessful in reach April to scheduled procedure for torn rotator cuff.

## 2024-10-26 ENCOUNTER — Encounter: Payer: Self-pay | Admitting: Radiology

## 2024-10-26 NOTE — Telephone Encounter (Signed)
 Ok to let pt know, he likely does not need another appt, and as Mechele been out of town and will return tomorrow, I will check then.  Thanks

## 2024-11-05 ENCOUNTER — Other Ambulatory Visit: Payer: Self-pay

## 2024-11-05 ENCOUNTER — Other Ambulatory Visit: Payer: Self-pay | Admitting: Internal Medicine

## 2024-11-09 ENCOUNTER — Telehealth: Payer: Self-pay | Admitting: Orthopaedic Surgery

## 2024-11-09 NOTE — Telephone Encounter (Signed)
 Received vm from patient stating his insurance company has not received his FMLA forms, wants them refaxed to [phone]. IC,lmvm advised forms were faxed 11/3 to [phone] and confirmation was received and that I have re-faxed the forms.

## 2024-11-10 ENCOUNTER — Telehealth: Payer: Self-pay

## 2024-11-10 NOTE — Telephone Encounter (Signed)
 Called and left voicemail letting Pt know his Ozempic  has arrived in the office & ready for pick up it has been placed in Nurse Station Fridge.

## 2024-11-12 ENCOUNTER — Ambulatory Visit

## 2024-11-12 ENCOUNTER — Encounter (HOSPITAL_BASED_OUTPATIENT_CLINIC_OR_DEPARTMENT_OTHER): Payer: Self-pay | Admitting: Orthopaedic Surgery

## 2024-11-12 ENCOUNTER — Other Ambulatory Visit: Payer: Self-pay

## 2024-11-12 VITALS — BP 112/74 | HR 60 | Ht 70.25 in | Wt 272.0 lb

## 2024-11-12 DIAGNOSIS — R600 Localized edema: Secondary | ICD-10-CM | POA: Diagnosis not present

## 2024-11-12 DIAGNOSIS — Z6838 Body mass index (BMI) 38.0-38.9, adult: Secondary | ICD-10-CM

## 2024-11-12 DIAGNOSIS — I5189 Other ill-defined heart diseases: Secondary | ICD-10-CM

## 2024-11-12 DIAGNOSIS — E66812 Obesity, class 2: Secondary | ICD-10-CM

## 2024-11-12 DIAGNOSIS — R0609 Other forms of dyspnea: Secondary | ICD-10-CM

## 2024-11-12 LAB — PULMONARY FUNCTION TEST
DL/VA % pred: 117 %
DL/VA: 4.78 ml/min/mmHg/L
DLCO unc % pred: 89 %
DLCO unc: 23.63 ml/min/mmHg
FEF 25-75 Post: 4.79 L/s
FEF 25-75 Pre: 3.53 L/s
FEF2575-%Change-Post: 35 %
FEF2575-%Pred-Post: 188 %
FEF2575-%Pred-Pre: 138 %
FEV1-%Change-Post: 6 %
FEV1-%Pred-Post: 87 %
FEV1-%Pred-Pre: 82 %
FEV1-Post: 2.92 L
FEV1-Pre: 2.74 L
FEV1FVC-%Change-Post: 0 %
FEV1FVC-%Pred-Pre: 115 %
FEV6-%Change-Post: 6 %
FEV6-%Pred-Post: 79 %
FEV6-%Pred-Pre: 75 %
FEV6-Post: 3.39 L
FEV6-Pre: 3.2 L
FEV6FVC-%Pred-Post: 105 %
FEV6FVC-%Pred-Pre: 105 %
FVC-%Change-Post: 6 %
FVC-%Pred-Post: 75 %
FVC-%Pred-Pre: 70 %
FVC-Post: 3.39 L
FVC-Pre: 3.2 L
Post FEV1/FVC ratio: 86 %
Post FEV6/FVC ratio: 100 %
Pre FEV1/FVC ratio: 86 %
Pre FEV6/FVC Ratio: 100 %
RV % pred: 85 %
RV: 2.1 L
TLC % pred: 75 %
TLC: 5.37 L

## 2024-11-12 MED ORDER — FUROSEMIDE 40 MG PO TABS: 40.0000 mg | ORAL_TABLET | Freq: Every day | ORAL | 0 refills | Status: DC

## 2024-11-12 NOTE — Progress Notes (Signed)
 New Patient Pulmonology Office Visit   Subjective:  Patient ID: Leon Nelson, male    DOB: 07-01-1956  MRN: [id number]  Referred by: Theodoro Lakes, MD  CC:  Chief Complaint  Patient presents with   Medical Management of Chronic Issues   Shortness of Breath    PFT performed today. Breathing is unchanged since last visit.    Leon Nelson is a 68 y.o. male with ascending aortic aneurysm, hypertension, fatty liver, DM 2, presents for evaluation of shortness of breath. Mother with HOCM but he tested negative for the gene.  Evaluated by cardiology for dyspnea.  09/10/24> SOB on E. Occasional cough. No asthma. CT Chest and PFT ordered 11/12/24>CT chest and PFTs does not explain DOE. Likely related to obesity.   Discussed the use of AI scribe software for clinical note transcription with the patient, who gave verbal consent to proceed.  History of Present Illness   Leon Nelson is a 68 year old male who presents with dyspnea on exertion for follow-up.  He experiences shortness of breath primarily during exertion, such as walking 600 feet to the trash can and back. These episodes are described as 'scary' but brief. No shortness of breath when lying flat, though it occasionally occurs when changing clothes.  He has a history of anxiety attacks in his twenties but states that his current symptoms are different and not related to anxiety. He has been on medication for anxiety for over twenty years and has not experienced a panic attack during this time.  He is currently taking Ozempic  and has lost significant weight, from a top weight of 357 pounds to 270 pounds over the last four to five years, even before starting Ozempic .  He denies taking any diuretics for his leg swelling, which he describes as unchanged. He is scheduled for shoulder replacement surgery on December 2nd.  Recent pulmonary function tests were performed; the patient did not report the results himself.       Lung  Health: Functional status: 600 ft.  Covid vaccine: 3.  Influenza vaccine: UTD.  Pneumonococcal vaccine: follows through PCP.  Smoking: non smoker.  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 2 dogs and cat for long time. No mold.   Tests:  Eosinophil 700 in,2010.  Latest elevation at 500 2024. Cardiac PET/CT 06/2024: CT part reviewed by me: Lung parenchyma appears normal.  Some scattered calcified nodules in right lung and liver.  Small. Echo May 2025 EF 55% grade 1 DD, no significant valvular heart disease normal RV. RVSP and TRVmax normal.  CT chest 08/2024> unremarkable parenchyma.  PFT 10/2024> mild restriction (TLC 75% pred). No obstruction. Normal DLCO, KCO corrected after VA adjustment.  ERV 35% pred.   Allergies: Penicillins and Sulfonamide derivatives  Current Outpatient Medications:    Accu-Chek Softclix Lancets lancets, USE AS DIRECTED TWICE DAILY, Disp: 100 each, Rfl: 3   albuterol  (VENTOLIN  HFA) 108 (90 Base) MCG/ACT inhaler, Inhale 2 puffs into the lungs every 6 (six) hours as needed for wheezing or shortness of breath., Disp: 8 g, Rfl: 11   allopurinol  (ZYLOPRIM ) 300 MG tablet, TAKE 1 TABLET BY MOUTH DAILY, Disp: 100 tablet, Rfl: 2   amitriptyline  (ELAVIL ) 50 MG tablet, 1 - 2 tabs by mouth at bedtime for nerve pain, Disp: 60 tablet, Rfl: 5   aspirin  EC 81 MG tablet, Take 1 tablet (81 mg total) by mouth daily. Swallow whole., Disp: , Rfl:  atenolol -chlorthalidone  (TENORETIC ) 50-25 MG tablet, TAKE ONE-HALF TABLET BY MOUTH  DAILY, Disp: 50 tablet, Rfl: 2   Blood Glucose Calibration (ACCU-CHEK GUIDE CONTROL) LIQD, Use as directed once daily as needed E11.9, Disp: 1 each, Rfl: 1   Blood Glucose Monitoring Suppl (ACCU-CHEK GUIDE ME) w/Device KIT, Use as directed up to four times per day E11.9, Disp: 1 kit, Rfl: 0   Continuous Glucose Receiver (FREESTYLE LIBRE 3 READER) DEVI, Use as directed four times per  day E11.9, Disp: 1 each, Rfl: 0   Continuous Glucose Sensor (FREESTYLE LIBRE 3 PLUS SENSOR) MISC, Change sensor every 15 days. E11.9, Disp: 6 each, Rfl: 3   dapagliflozin  propanediol (FARXIGA ) 10 MG TABS tablet, Take 1 tablet (10 mg total) by mouth daily before breakfast., Disp: 90 tablet, Rfl: 3   diclofenac  sodium (VOLTAREN ) 1 % GEL, Apply 2 g topically 4 (four) times daily as needed., Disp: 200 g, Rfl: 5   dicyclomine  (BENTYL ) 20 MG tablet, TAKE 1 TABLET BY MOUTH 3 TIMES  DAILY AS NEEDED, Disp: 300 tablet, Rfl: 3   furosemide (LASIX) 40 MG tablet, Take 1 tablet (40 mg total) by mouth daily., Disp: 30 tablet, Rfl: 0   gabapentin  (NEURONTIN ) 300 MG capsule, TAKE 1 CAPSULE BY MOUTH TWICE  DAILY, Disp: 200 capsule, Rfl: 1   glipiZIDE  (GLUCOTROL  XL) 10 MG 24 hr tablet, TAKE 1 TABLET BY MOUTH DAILY  WITH BREAKFAST, Disp: 100 tablet, Rfl: 3   ketoconazole (NIZORAL) 2 % shampoo, Apply 1 Application topically 3 (three) times a week., Disp: , Rfl:    levothyroxine  (SYNTHROID ) 50 MCG tablet, TAKE 1 TABLET BY MOUTH DAILY, Disp: 100 tablet, Rfl: 3   lisinopril  (ZESTRIL ) 20 MG tablet, TAKE 1 TABLET BY MOUTH DAILY, Disp: 100 tablet, Rfl: 2   metFORMIN  (GLUCOPHAGE -XR) 500 MG 24 hr tablet, TAKE 2 TABLETS BY MOUTH TWICE  DAILY WITH A MEAL, Disp: 400 tablet, Rfl: 2   potassium chloride  SA (KLOR-CON  M) 20 MEQ tablet, TAKE 2 TABLETS BY MOUTH DAILY, Disp: 200 tablet, Rfl: 2   rosuvastatin  (CRESTOR ) 40 MG tablet, TAKE 1 TABLET BY MOUTH DAILY, Disp: 90 tablet, Rfl: 3   Semaglutide , 1 MG/DOSE, 4 MG/3ML SOPN, Inject 1 mg as directed once a week., Disp: 9 mL, Rfl: 3   solifenacin  (VESICARE ) 5 MG tablet, Take 1 tablet by mouth once daily, Disp: 90 tablet, Rfl: 0   venlafaxine  XR (EFFEXOR -XR) 75 MG 24 hr capsule, TAKE 1 CAPSULE BY MOUTH DAILY, Disp: 100 capsule, Rfl: 2   aspirin  EC 325 MG tablet, Take 1 tablet (325 mg total) by mouth daily. (Patient not taking: Reported on 11/12/2024), Disp: 14 tablet, Rfl: 0   oxyCODONE   (ROXICODONE ) 5 MG immediate release tablet, Take 1 tablet (5 mg total) by mouth every 4 (four) hours as needed for severe pain (pain score 7-10) or breakthrough pain. (Patient not taking: Reported on 11/12/2024), Disp: 30 tablet, Rfl: 0 Past Medical History:  Diagnosis Date   ABDOMINAL PAIN OTHER SPECIFIED SITE 07/20/2008   Arthritis Currently   CHEST PAIN 02/06/2011   COLONIC POLYPS, HX OF 05/26/2008   DEPRESSION 05/26/2008   DIABETES MELLITUS, TYPE II 05/26/2008   HYPERLIPIDEMIA 05/26/2008   HYPERTENSION 05/26/2008   HYPOTHYROIDISM 05/26/2008   KNEE PAIN, RIGHT, ACUTE 07/01/2008   Personal history of urinary calculi 05/26/2008   Pure hypercholesterolemia 05/19/2024   UTI'S, HX OF 05/26/2008   Past Surgical History:  Procedure Laterality Date   JOINT REPLACEMENT  09/2009   Right Knee   s/p  right knee arthroplasty      complicated by patellar tendon rupture Jan 2011 Dr. heide   TONSILLECTOMY     Family History  Problem Relation Age of Onset   Cancer Mother        breast cancer   Hyperlipidemia Mother    Heart disease Mother    Hyperlipidemia Father    Heart disease Father    Alcohol abuse Other    Diabetes Other    Social History   Socioeconomic History   Marital status: Married    Spouse name: Not on file   Number of children: Not on file   Years of education: Not on file   Highest education level: Some college, no degree  Occupational History   Not on file  Tobacco Use   Smoking status: Never   Smokeless tobacco: Never  Substance and Sexual Activity   Alcohol use: No   Drug use: No   Sexual activity: Yes  Other Topics Concern   Not on file  Social History Narrative   married   Social Drivers of Corporate Investment Banker Strain: Low Risk  (07/25/2024)   Overall Financial Resource Strain (CARDIA)    Difficulty of Paying Living Expenses: Not hard at all  Food Insecurity: No Food Insecurity (07/25/2024)   Hunger Vital Sign    Worried About Running Out of  Food in the Last Year: Never true    Ran Out of Food in the Last Year: Never true  Transportation Needs: No Transportation Needs (07/25/2024)   PRAPARE - Administrator, Civil Service (Medical): No    Lack of Transportation (Non-Medical): No  Physical Activity: Inactive (07/25/2024)   Exercise Vital Sign    Days of Exercise per Week: 0 days    Minutes of Exercise per Session: Not on file  Stress: No Stress Concern Present (07/25/2024)   [employer] of Occupational Health - Occupational Stress Questionnaire    Feeling of Stress: Not at all  Social Connections: Socially Integrated (07/25/2024)   Social Connection and Isolation Panel    Frequency of Communication with Friends and Family: More than three times a week    Frequency of Social Gatherings with Friends and Family: Once a week    Attends Religious Services: More than 4 times per year    Active Member of [employer] or Organizations: Yes    Attends Engineer, Structural: More than 4 times per year    Marital Status: Married  Catering Manager Violence: Not At Risk (05/26/2024)   Humiliation, Afraid, Rape, and Kick questionnaire    Fear of Current or Ex-Partner: No    Emotionally Abused: No    Physically Abused: No    Sexually Abused: No       Objective:  BP 112/74   Pulse 60   Ht 5' 10.25 (1.784 m)   Wt 272 lb (123.4 kg)   SpO2 97% Comment: on RA  BMI 38.75 kg/m  BMI Readings from Last 3 Encounters:  11/12/24 38.75 kg/m  09/23/24 37.70 kg/m  09/17/24 38.30 kg/m    General: No distress elderly gentleman who appears obese. Lungs: clear to auscultation bilaterally.  Heart: regular rate rhythm, no murmur appreciated.  Abdomen: non tender, non distended. Normal BS.  Neuro: axox3.  Moving all extremities. 1-2+ pitting edema.    Diagnostic Review:       Assessment & Plan:   Assessment & Plan DOE (dyspnea on exertion)  Obesity, class 2  Diastolic dysfunction  Assessment and Plan    Dyspnea  on exertion Mild restrictive pattern on PFTs with normal DLCO. No significant lung condition identified. Possible contribution from obesity. Anxiety if a differential but he thinks less likely with good control on current meds.  - Start Lasix 40 mg post-shoulder surgery to manage fluid retention and assess impact on dyspnea. - Order BMP to monitor electrolytes and kidney function after starting Lasix.  - Instruct to report any lightheadedness or adverse effects from Lasix. - Coordinate with cardiologist for ongoing management and follow-up. I have sent them a message.   Lower extremity edema Persistent edema with potential benefit from diuretic therapy post-surgery. - Start Lasix 40 mg post-surgery to manage edema. - Order BMP to monitor electrolytes and kidney function after starting Lasix. - Instruct to report any lightheadedness or adverse effects from Lasix. - Coordinate with cardiologist for ongoing management and follow-up.  Obesity, class 2 Significant weight loss from 357 lbs to 270 lbs over 4-5 years. Continued weight management important for overall health and potential impact on dyspnea and edema. - Continue current weight management strategies, including Ozempic .       Orders Placed This Encounter  Procedures   Basic Metabolic Panel (BMET)    I personally spent a total of 25 minutes in the care of the patient today including preparing to see the patient, getting/reviewing separately obtained history, performing a medically appropriate exam/evaluation, counseling and educating, referring and communicating with other health care professionals, documenting clinical information in the EHR, and independently interpreting results.   Return if symptoms worsen or fail to improve.   Keonna Raether, MD

## 2024-11-12 NOTE — Patient Instructions (Signed)
 Full pft performed today

## 2024-11-12 NOTE — Progress Notes (Signed)
 Full pft performed today

## 2024-11-12 NOTE — Patient Instructions (Signed)
  VISIT SUMMARY:  You came in today for a follow-up regarding your shortness of breath during exertion. We discussed your symptoms, recent weight loss, and your upcoming shoulder replacement surgery. We also reviewed your recent pulmonary function tests and your history of anxiety.  YOUR PLAN:  -DYSPNEA ON EXERTION: Dyspnea on exertion means experiencing shortness of breath during physical activity. Your pulmonary function tests showed a mild restrictive pattern but no significant lung condition. We will start you on Lasix  40 mg after your surgery to help manage fluid retention, which may improve your breathing. We will also monitor your electrolytes and kidney function with a BMP test after starting Lasix . Please report any lightheadedness or adverse effects from the medication. We will coordinate with your cardiologist for ongoing management and follow-up. Please get blood work 2 weeks after starting lasix .   -LOWER EXTREMITY EDEMA: Lower extremity edema means swelling in your legs. We believe that starting Lasix  40 mg after your surgery will help manage this swelling. We will monitor your electrolytes and kidney function with a BMP test after starting Lasix . Please report any lightheadedness or adverse effects from the medication. We will coordinate with your cardiologist for ongoing management and follow-up.  -OBESITY, CLASS 2: Class 2 obesity means having a body mass index (BMI) between 35 and 39.9. You have lost significant weight, from 357 lbs to 270 lbs, over the last 4-5 years. Continuing your current weight management strategies, including taking Ozempic , is important for your overall health and may help improve your breathing and reduce swelling in your legs.                       Contains text generated by [employer]bridge.                                 Contains text generated by Abridge.

## 2024-11-13 ENCOUNTER — Encounter (HOSPITAL_BASED_OUTPATIENT_CLINIC_OR_DEPARTMENT_OTHER)
Admission: RE | Admit: 2024-11-13 | Discharge: 2024-11-13 | Disposition: A | Discharge status: Home / Self Care | Source: Ambulatory Visit | Attending: Orthopaedic Surgery | Admitting: Orthopaedic Surgery

## 2024-11-13 DIAGNOSIS — I5189 Other ill-defined heart diseases: Secondary | ICD-10-CM | POA: Insufficient documentation

## 2024-11-13 DIAGNOSIS — Z01812 Encounter for preprocedural laboratory examination: Secondary | ICD-10-CM | POA: Diagnosis present

## 2024-11-13 LAB — BASIC METABOLIC PANEL WITH GFR
Anion gap: 9 (ref 5–15)
BUN: 15 mg/dL (ref 8–23)
CO2: 24 mmol/L (ref 22–32)
Calcium: 8.9 mg/dL (ref 8.9–10.3)
Chloride: 105 mmol/L (ref 98–111)
Creatinine, Ser: 0.77 mg/dL (ref 0.61–1.24)
GFR, Estimated: >60 mL/min (ref >60)
Glucose, Bld: 127 mg/dL — ABNORMAL HIGH (ref 70–99)
Potassium: 4.1 mmol/L (ref 3.5–5.1)
Sodium: 138 mmol/L (ref 135–145)

## 2024-11-13 LAB — SURGICAL PCR SCREEN
MRSA, PCR: NEGATIVE
Staphylococcus aureus: POSITIVE — AB

## 2024-11-13 NOTE — Progress Notes (Signed)
      Enhanced Recovery after Surgery Enhanced Recovery after Surgery is a protocol used to improve the stress on your body and your recovery after surgery.  Patient Instructions  The night before surgery:  No food after midnight. ONLY clear liquids after midnight  The day of surgery (if you do NOT have diabetes):  Drink ONE (1) Pre-Surgery Clear Ensure as directed.   This drink was given to you during your hospital  pre-op appointment visit. The pre-op nurse will instruct you on the time to drink the  Pre-Surgery Ensure depending on your surgery time. Finish the drink at the designated time by the pre-op nurse.  Nothing else to drink after completing the  Pre-Surgery Clear Ensure.  The day of surgery (if you have diabetes): Drink ONE (1) Gatorade 2 (G2) as directed. This drink was given to you during your hospital  pre-op appointment visit.  The pre-op nurse will instruct you on the time to drink the   Gatorade 2 (G2) depending on your surgery time. Color of the Gatorade may vary. Red is not allowed. Nothing else to drink after completing the  Gatorade 2 (G2).         If you have questions, please contact your surgeon's office.  Surgical soap and BPO given to patient with instructions for use.  Patient verbalized understanding of instructions.

## 2024-11-16 NOTE — Progress Notes (Signed)
 Surgical PCR results sent to Dr. Genelle via secure chat and called to his office.

## 2024-11-23 NOTE — Care Plan (Signed)
 Ortho Bundle Case Management Note  Patient Details  Name: Leon Nelson MRN: [id number] Date of Birth: 26-Jun-1956  [facility] was provided with patient's name today (1 day prior to surgery) and called to discuss his upcoming Right reverse shoulder arthroplasty with Dr. Genelle at [facility] on 11/24/24. Patient is agreeable to case management. His plan is to return home with assistance from his spouse. No DME needed prior to surgery. Will be provided sling and ice machine at [facility]. OPPT already scheduled for 11/30/24 at [facility] location. Reviewed post op care instructions. Will continue to follow for needs.                   DME Arranged:    DME Agency:     HH Arranged:    HH Agency:   (No home health PT needed; OPPT ordered)  Additional Comments: Please contact me with any questions of if this plan should need to change.  Tylene Ned, RN, BSN, [employer]  [phone] 11/23/2024, 4:29 PM

## 2024-11-24 ENCOUNTER — Other Ambulatory Visit: Payer: Self-pay

## 2024-11-24 ENCOUNTER — Ambulatory Visit (HOSPITAL_BASED_OUTPATIENT_CLINIC_OR_DEPARTMENT_OTHER): Admitting: Anesthesiology

## 2024-11-24 ENCOUNTER — Ambulatory Visit (HOSPITAL_BASED_OUTPATIENT_CLINIC_OR_DEPARTMENT_OTHER)
Admission: RE | Admit: 2024-11-24 | Discharge: 2024-11-24 | Disposition: A | Discharge status: Home / Self Care | Attending: Orthopaedic Surgery | Admitting: Orthopaedic Surgery

## 2024-11-24 ENCOUNTER — Encounter (HOSPITAL_BASED_OUTPATIENT_CLINIC_OR_DEPARTMENT_OTHER)
Admission: RE | Disposition: A | Discharge status: Home / Self Care | Payer: Self-pay | Source: Home / Self Care | Attending: Orthopaedic Surgery

## 2024-11-24 ENCOUNTER — Encounter (HOSPITAL_BASED_OUTPATIENT_CLINIC_OR_DEPARTMENT_OTHER): Payer: Self-pay | Admitting: Orthopaedic Surgery

## 2024-11-24 ENCOUNTER — Ambulatory Visit (HOSPITAL_COMMUNITY)

## 2024-11-24 DIAGNOSIS — W1830XA Fall on same level, unspecified, initial encounter: Secondary | ICD-10-CM | POA: Insufficient documentation

## 2024-11-24 DIAGNOSIS — Z833 Family history of diabetes mellitus: Secondary | ICD-10-CM | POA: Insufficient documentation

## 2024-11-24 DIAGNOSIS — M75101 Unspecified rotator cuff tear or rupture of right shoulder, not specified as traumatic: Secondary | ICD-10-CM

## 2024-11-24 DIAGNOSIS — I2510 Atherosclerotic heart disease of native coronary artery without angina pectoris: Secondary | ICD-10-CM

## 2024-11-24 DIAGNOSIS — M19011 Primary osteoarthritis, right shoulder: Secondary | ICD-10-CM

## 2024-11-24 DIAGNOSIS — E66813 Obesity, class 3: Secondary | ICD-10-CM | POA: Diagnosis not present

## 2024-11-24 DIAGNOSIS — Z6836 Body mass index (BMI) 36.0-36.9, adult: Secondary | ICD-10-CM | POA: Diagnosis not present

## 2024-11-24 DIAGNOSIS — I10 Essential (primary) hypertension: Secondary | ICD-10-CM

## 2024-11-24 DIAGNOSIS — F32A Depression, unspecified: Secondary | ICD-10-CM | POA: Diagnosis not present

## 2024-11-24 DIAGNOSIS — E039 Hypothyroidism, unspecified: Secondary | ICD-10-CM | POA: Insufficient documentation

## 2024-11-24 DIAGNOSIS — S46011A Strain of muscle(s) and tendon(s) of the rotator cuff of right shoulder, initial encounter: Secondary | ICD-10-CM

## 2024-11-24 DIAGNOSIS — Z79899 Other long term (current) drug therapy: Secondary | ICD-10-CM | POA: Insufficient documentation

## 2024-11-24 DIAGNOSIS — Z7984 Long term (current) use of oral hypoglycemic drugs: Secondary | ICD-10-CM | POA: Insufficient documentation

## 2024-11-24 DIAGNOSIS — M1990 Unspecified osteoarthritis, unspecified site: Secondary | ICD-10-CM | POA: Insufficient documentation

## 2024-11-24 DIAGNOSIS — M75121 Complete rotator cuff tear or rupture of right shoulder, not specified as traumatic: Secondary | ICD-10-CM | POA: Diagnosis not present

## 2024-11-24 DIAGNOSIS — E119 Type 2 diabetes mellitus without complications: Secondary | ICD-10-CM | POA: Diagnosis not present

## 2024-11-24 DIAGNOSIS — Z7951 Long term (current) use of inhaled steroids: Secondary | ICD-10-CM | POA: Diagnosis not present

## 2024-11-24 DIAGNOSIS — Z01818 Encounter for other preprocedural examination: Secondary | ICD-10-CM

## 2024-11-24 DIAGNOSIS — Z7982 Long term (current) use of aspirin: Secondary | ICD-10-CM | POA: Diagnosis not present

## 2024-11-24 DIAGNOSIS — Z79890 Hormone replacement therapy (postmenopausal): Secondary | ICD-10-CM | POA: Insufficient documentation

## 2024-11-24 DIAGNOSIS — Z7985 Long-term (current) use of injectable non-insulin antidiabetic drugs: Secondary | ICD-10-CM | POA: Insufficient documentation

## 2024-11-24 HISTORY — PX: REVERSE SHOULDER ARTHROPLASTY: SHX5054

## 2024-11-24 LAB — GLUCOSE, CAPILLARY
Glucose-Capillary: 180 mg/dL — ABNORMAL HIGH (ref 70–99)
Glucose-Capillary: 140 mg/dL — ABNORMAL HIGH (ref 70–99)

## 2024-11-24 SURGERY — ARTHROPLASTY, SHOULDER, TOTAL, REVERSE
Anesthesia: General | Site: Shoulder | Laterality: Right

## 2024-11-24 MED ORDER — VANCOMYCIN HCL 500 MG IV SOLR
INTRAVENOUS | Status: AC
  Filled 2024-11-24: qty 30

## 2024-11-24 MED ORDER — SCOPOLAMINE 1 MG/3DAYS TD PT72
MEDICATED_PATCH | TRANSDERMAL | Status: AC
  Filled 2024-11-24: qty 1

## 2024-11-24 MED ORDER — VANCOMYCIN HCL 1000 MG IV SOLR
INTRAVENOUS | Status: DC | PRN
  Administered 2024-11-24: 1000 mg via TOPICAL

## 2024-11-24 MED ORDER — CEFAZOLIN SODIUM 1 G IJ SOLR
INTRAMUSCULAR | Status: AC
  Filled 2024-11-24: qty 50

## 2024-11-24 MED ORDER — PHENYLEPHRINE 80 MCG/ML (10ML) SYRINGE FOR IV PUSH (FOR BLOOD PRESSURE SUPPORT)
PREFILLED_SYRINGE | INTRAVENOUS | Status: AC
  Filled 2024-11-24: qty 20

## 2024-11-24 MED ORDER — GLYCOPYRROLATE PF 0.2 MG/ML IJ SOSY
PREFILLED_SYRINGE | INTRAMUSCULAR | Status: DC | PRN
  Administered 2024-11-24: .2 mg via INTRAVENOUS

## 2024-11-24 MED ORDER — LACTATED RINGERS IV SOLN: INTRAVENOUS | Status: DC | PRN

## 2024-11-24 MED ORDER — SODIUM CHLORIDE 0.9 % IV SOLN
INTRAVENOUS | Status: AC | PRN
  Administered 2024-11-24: 1000 mL

## 2024-11-24 MED ORDER — FENTANYL CITRATE (PF) 100 MCG/2ML IJ SOLN
INTRAMUSCULAR | Status: AC
  Filled 2024-11-24: qty 2

## 2024-11-24 MED ORDER — LIDOCAINE 2% (20 MG/ML) 5 ML SYRINGE
INTRAMUSCULAR | Status: DC | PRN
  Administered 2024-11-24: 60 mg via INTRAVENOUS

## 2024-11-24 MED ORDER — FENTANYL CITRATE (PF) 100 MCG/2ML IJ SOLN
50.0000 ug | Freq: Once | INTRAMUSCULAR | Status: AC
  Administered 2024-11-24: 50 ug via INTRAVENOUS

## 2024-11-24 MED ORDER — FENTANYL CITRATE (PF) 100 MCG/2ML IJ SOLN: 25.0000 ug | INTRAMUSCULAR | Status: DC | PRN

## 2024-11-24 MED ORDER — ROCURONIUM BROMIDE 10 MG/ML (PF) SYRINGE
PREFILLED_SYRINGE | INTRAVENOUS | Status: AC
  Filled 2024-11-24: qty 10

## 2024-11-24 MED ORDER — ARTIFICIAL TEARS OPHTHALMIC OINT
TOPICAL_OINTMENT | OPHTHALMIC | Status: AC
  Filled 2024-11-24: qty 10.5

## 2024-11-24 MED ORDER — PROPOFOL 10 MG/ML IV BOLUS
INTRAVENOUS | Status: DC | PRN
  Administered 2024-11-24: 130 mg via INTRAVENOUS

## 2024-11-24 MED ORDER — BUPIVACAINE LIPOSOME 1.3 % IJ SUSP
INTRAMUSCULAR | Status: DC | PRN
  Administered 2024-11-24: 10 mL via PERINEURAL

## 2024-11-24 MED ORDER — PHENYLEPHRINE 80 MCG/ML (10ML) SYRINGE FOR IV PUSH (FOR BLOOD PRESSURE SUPPORT)
PREFILLED_SYRINGE | INTRAVENOUS | Status: DC | PRN
  Administered 2024-11-24 (×4): 160 ug via INTRAVENOUS
  Administered 2024-11-24: 80 ug via INTRAVENOUS

## 2024-11-24 MED ORDER — DIPHENHYDRAMINE HCL 50 MG/ML IJ SOLN
INTRAMUSCULAR | Status: AC
  Filled 2024-11-24: qty 1

## 2024-11-24 MED ORDER — GABAPENTIN 300 MG PO CAPS
300.0000 mg | ORAL_CAPSULE | Freq: Once | ORAL | Status: AC
  Administered 2024-11-24: 300 mg via ORAL

## 2024-11-24 MED ORDER — PROPOFOL 500 MG/50ML IV EMUL
INTRAVENOUS | Status: DC | PRN
  Administered 2024-11-24: 100 ug/kg/min via INTRAVENOUS

## 2024-11-24 MED ORDER — CEFAZOLIN SODIUM-DEXTROSE 3-4 GM/150ML-% IV SOLN
3.0000 g | INTRAVENOUS | Status: AC
  Administered 2024-11-24: 3 g via INTRAVENOUS

## 2024-11-24 MED ORDER — ROCURONIUM 10MG/ML (10ML) SYRINGE FOR MEDFUSION PUMP - OPTIME
INTRAVENOUS | Status: DC | PRN
  Administered 2024-11-24: 30 mg via INTRAVENOUS
  Administered 2024-11-24: 50 mg via INTRAVENOUS

## 2024-11-24 MED ORDER — PHENYLEPHRINE 80 MCG/ML (10ML) SYRINGE FOR IV PUSH (FOR BLOOD PRESSURE SUPPORT)
PREFILLED_SYRINGE | INTRAVENOUS | Status: AC
  Filled 2024-11-24: qty 10

## 2024-11-24 MED ORDER — POVIDONE-IODINE 10 % EX SOLN
CUTANEOUS | Status: DC | PRN
  Administered 2024-11-24: 1 via TOPICAL

## 2024-11-24 MED ORDER — TRANEXAMIC ACID-NACL 1000-0.7 MG/100ML-% IV SOLN
1000.0000 mg | INTRAVENOUS | Status: AC
  Administered 2024-11-24: 1000 mg via INTRAVENOUS

## 2024-11-24 MED ORDER — SUGAMMADEX SODIUM 200 MG/2ML IV SOLN
INTRAVENOUS | Status: DC | PRN
  Administered 2024-11-24: 400 mg via INTRAVENOUS

## 2024-11-24 MED ORDER — CEFAZOLIN SODIUM-DEXTROSE 3-4 GM/150ML-% IV SOLN
INTRAVENOUS | Status: AC
  Filled 2024-11-24: qty 150

## 2024-11-24 MED ORDER — LIDOCAINE 2% (20 MG/ML) 5 ML SYRINGE
INTRAMUSCULAR | Status: AC
  Filled 2024-11-24: qty 5

## 2024-11-24 MED ORDER — GABAPENTIN 300 MG PO CAPS
ORAL_CAPSULE | ORAL | Status: AC
  Filled 2024-11-24: qty 1

## 2024-11-24 MED ORDER — ONDANSETRON HCL 4 MG/2ML IJ SOLN
INTRAMUSCULAR | Status: DC | PRN
  Administered 2024-11-24: 4 mg via INTRAVENOUS

## 2024-11-24 MED ORDER — FENTANYL CITRATE (PF) 100 MCG/2ML IJ SOLN
INTRAMUSCULAR | Status: DC | PRN
  Administered 2024-11-24 (×2): 50 ug via INTRAVENOUS

## 2024-11-24 MED ORDER — ACETAMINOPHEN 500 MG PO TABS
1000.0000 mg | ORAL_TABLET | Freq: Once | ORAL | Status: AC
  Administered 2024-11-24: 1000 mg via ORAL

## 2024-11-24 MED ORDER — HYDRALAZINE HCL 20 MG/ML IJ SOLN
INTRAMUSCULAR | Status: AC
  Filled 2024-11-24: qty 1

## 2024-11-24 MED ORDER — EPHEDRINE 5 MG/ML INJ
INTRAVENOUS | Status: AC
  Filled 2024-11-24: qty 5

## 2024-11-24 MED ORDER — ONDANSETRON HCL 4 MG/2ML IJ SOLN
INTRAMUSCULAR | Status: AC
  Filled 2024-11-24: qty 2

## 2024-11-24 MED ORDER — MIDAZOLAM HCL 2 MG/2ML IJ SOLN
INTRAMUSCULAR | Status: AC
  Filled 2024-11-24: qty 2

## 2024-11-24 MED ORDER — PHENYLEPHRINE HCL-NACL 20-0.9 MG/250ML-% IV SOLN
INTRAVENOUS | Status: DC | PRN
  Administered 2024-11-24: 40 ug/min via INTRAVENOUS

## 2024-11-24 MED ORDER — ALBUTEROL SULFATE HFA 108 (90 BASE) MCG/ACT IN AERS
INHALATION_SPRAY | RESPIRATORY_TRACT | Status: AC
  Filled 2024-11-24: qty 6.7

## 2024-11-24 MED ORDER — TRANEXAMIC ACID-NACL 1000-0.7 MG/100ML-% IV SOLN
INTRAVENOUS | Status: AC
  Filled 2024-11-24: qty 100

## 2024-11-24 MED ORDER — 0.9 % SODIUM CHLORIDE (POUR BTL) OPTIME
TOPICAL | Status: DC | PRN
  Administered 2024-11-24: 1000 mL

## 2024-11-24 MED ORDER — LACTATED RINGERS IV SOLN: INTRAVENOUS | Status: DC

## 2024-11-24 MED ORDER — ACETAMINOPHEN 500 MG PO TABS
ORAL_TABLET | ORAL | Status: AC
  Filled 2024-11-24: qty 2

## 2024-11-24 MED ORDER — BUPIVACAINE-EPINEPHRINE (PF) 0.5% -1:200000 IJ SOLN
INTRAMUSCULAR | Status: DC | PRN
  Administered 2024-11-24: 15 mL via PERINEURAL

## 2024-11-24 SURGICAL SUPPLY — 58 items
AUGMENT BASEPLATE 15DEG 25 WDG (Joint) IMPLANT
BIT DRILL 3.2 PERIPHERAL SCREW (BIT) IMPLANT
BLADE SAW SGTL 73X25 THK (BLADE) ×2 IMPLANT
BLADE SURG 10 STRL SS (BLADE) IMPLANT
BLADE SURG 15 STRL LF DISP TIS (BLADE) IMPLANT
BRUSH SCRUB EZ PLAIN DRY (MISCELLANEOUS) IMPLANT
CHLORAPREP W/TINT 26 (MISCELLANEOUS) ×2 IMPLANT
CLSR STERI-STRIP ANTIMIC 1/2X4 (GAUZE/BANDAGES/DRESSINGS) IMPLANT
COOLER ICEMAN CLASSIC (MISCELLANEOUS) ×2 IMPLANT
COVER BACK TABLE 60X90IN (DRAPES) ×2 IMPLANT
COVER MAYO STAND STRL (DRAPES) ×2 IMPLANT
DERMABOND ADVANCED .7 DNX12 (GAUZE/BANDAGES/DRESSINGS) IMPLANT
DRAPE IMP U-DRAPE 54X76 (DRAPES) IMPLANT
DRAPE INCISE IOBAN 66X45 STRL (DRAPES) ×2 IMPLANT
DRAPE POUCH INSTRU U-SHP 10X18 (DRAPES) ×2 IMPLANT
DRAPE U-SHAPE 47X51 STRL (DRAPES) ×4 IMPLANT
DRAPE U-SHAPE 76X120 STRL (DRAPES) ×4 IMPLANT
DRSG AQUACEL AG ADV 3.5X10 (GAUZE/BANDAGES/DRESSINGS) ×2 IMPLANT
ELECTRODE BLDE 4.0 EZ CLN MEGD (MISCELLANEOUS) ×2 IMPLANT
ELECTRODE REM PT RTRN 9FT ADLT (ELECTROSURGICAL) ×2 IMPLANT
GAUZE PAD ABD 8X10 STRL (GAUZE/BANDAGES/DRESSINGS) IMPLANT
GAUZE XEROFORM 1X8 LF (GAUZE/BANDAGES/DRESSINGS) IMPLANT
GLENOSPHERE STD 39 (Joint) IMPLANT
GLOVE BIO SURGEON STRL SZ 6 (GLOVE) ×4 IMPLANT
GLOVE BIO SURGEON STRL SZ7.5 (GLOVE) ×4 IMPLANT
GLOVE BIOGEL PI IND STRL 6.5 (GLOVE) ×2 IMPLANT
GLOVE BIOGEL PI IND STRL 8 (GLOVE) ×2 IMPLANT
GOWN STRL REUS W/ TWL LRG LVL3 (GOWN DISPOSABLE) ×4 IMPLANT
GOWN STRL REUS W/TWL XL LVL3 (GOWN DISPOSABLE) ×2 IMPLANT
GUIDEWIRE GLENOID 2.5X220 (WIRE) IMPLANT
INSERT CUP HUM 1/2 39 +0 (Insert) IMPLANT
KIT STABILIZATION SHOULDER (MISCELLANEOUS) ×2 IMPLANT
MANIFOLD NEPTUNE II (INSTRUMENTS) ×2 IMPLANT
PACK BASIN DAY SURGERY FS (CUSTOM PROCEDURE TRAY) ×2 IMPLANT
PACK SHOULDER (CUSTOM PROCEDURE TRAY) ×2 IMPLANT
PAD COLD SHLDR WRAP-ON (PAD) ×2 IMPLANT
PIN GUIDE 3X75 SHOULDER (PIN) IMPLANT
RESTRAINT HEAD UNIVERSAL NS (MISCELLANEOUS) ×2 IMPLANT
SCREW 5.0X18 (Screw) IMPLANT
SCREW 5.5X22 (Screw) IMPLANT
SCREW 5.5X26 (Screw) IMPLANT
SCREW BONE INTRNL SM 7 (Screw) IMPLANT
SET HNDPC FAN SPRY TIP SCT (DISPOSABLE) ×2 IMPLANT
SHEET MEDIUM DRAPE 40X70 STRL (DRAPES) ×2 IMPLANT
SLEEVE SCD COMPRESS KNEE MED (STOCKING) ×2 IMPLANT
SPIKE FLUID TRANSFER (MISCELLANEOUS) IMPLANT
SPONGE T-LAP 18X18 ~~LOC~~+RFID (SPONGE) ×2 IMPLANT
STEM HUMERAL STD SHORT SIZE 2 (Orthopedic Implant) IMPLANT
SUCTION TUBE FRAZIER 10FR DISP (SUCTIONS) ×2 IMPLANT
SUT ETHIBOND 2 V 37 (SUTURE) ×2 IMPLANT
SUT ETHILON 3 0 PS 1 (SUTURE) IMPLANT
SUT MNCRL AB 3-0 PS2 27 (SUTURE) ×2 IMPLANT
SUT VIC AB 0 CT1 27XBRD ANBCTR (SUTURE) ×4 IMPLANT
SUT VIC AB 2-0 CT1 TAPERPNT 27 (SUTURE) ×4 IMPLANT
SUT VIC AB 3-0 SH 27X BRD (SUTURE) IMPLANT
SYR 50ML LL SCALE MARK (SYRINGE) ×2 IMPLANT
TOWEL GREEN STERILE FF (TOWEL DISPOSABLE) ×6 IMPLANT
TUBE SUCTION HIGH CAP CLEAR NV (SUCTIONS) ×2 IMPLANT

## 2024-11-24 NOTE — Anesthesia Preprocedure Evaluation (Addendum)
 Anesthesia Evaluation  Patient identified by MRN, date of birth, ID band Patient awake    Reviewed: Allergy & Precautions, H&P , NPO status , Patient's Chart, lab work & pertinent test results, reviewed documented beta blocker date and time   Airway Mallampati: I  TM Distance: >3 FB Neck ROM: Full    Dental no notable dental hx. (+) Edentulous Upper, Edentulous Lower, Dental Advisory Given   Pulmonary neg pulmonary ROS   Pulmonary exam normal breath sounds clear to auscultation       Cardiovascular hypertension, Pt. on medications and Pt. on home beta blockers  Rhythm:Regular Rate:Normal     Neuro/Psych    Depression    negative neurological ROS     GI/Hepatic negative GI ROS, Neg liver ROS,,,  Endo/Other  diabetes, Type 2, Oral Hypoglycemic AgentsHypothyroidism  Class 3 obesity  Renal/GU negative Renal ROS  negative genitourinary   Musculoskeletal  (+) Arthritis , Osteoarthritis,    Abdominal   Peds  Hematology negative hematology ROS (+)   Anesthesia Other Findings   Reproductive/Obstetrics negative OB ROS                              Anesthesia Physical Anesthesia Plan  ASA: 3  Anesthesia Plan: General   Post-op Pain Management: Regional block* and Tylenol  PO (pre-op)*   Induction: Intravenous  PONV Risk Score and Plan: 3 and Ondansetron  and Dexamethasone  Airway Management Planned: Oral ETT  Additional Equipment:   Intra-op Plan:   Post-operative Plan: Extubation in OR  Informed Consent: I have reviewed the patients History and Physical, chart, labs and discussed the procedure including the risks, benefits and alternatives for the proposed anesthesia with the patient or authorized representative who has indicated his/her understanding and acceptance.     Dental advisory given  Plan Discussed with: CRNA  Anesthesia Plan Comments:          Anesthesia Quick  Evaluation

## 2024-11-24 NOTE — Op Note (Signed)
 Date of Surgery: 11/24/2024  INDICATIONS: Leon Nelson is a 68 y.o.-year-old male with right shoulder rotator cuff arthropathy.  The risk and benefits of the procedure were discussed in detail and documented in the pre-operative evaluation.   PREOPERATIVE DIAGNOSIS: 1. Right shoulder rotator cuff arthropathy  POSTOPERATIVE DIAGNOSIS: Same.  PROCEDURE: 1. Right shoulder reverse shoulder arthroplasty 2. Right shoulder biceps tenodesis  SURGEON: Leon LITTIE Parker MD  ASSISTANT: Conley Dawson, ATC  ANESTHESIA:  general  IV FLUIDS AND URINE: See anesthesia record.  ANTIBIOTICS: Ancef  ESTIMATED BLOOD LOSS: 10 mL.  IMPLANTS:  Implant Name Type Inv. Item Serial No. Manufacturer Lot No. LRB No. Used Action  SCREW BONE INTRNL SM 7 - [id number] Screw SCREW BONE INTRNL SM 7 [id number] [employer]  Right 1 Implanted  AUGMENT BASEPLATE 15DEG 25 WDG - [id number] Joint AUGMENT BASEPLATE 15DEG 25 WDG [id number] [employer]  Right 1 Implanted  GLENOSPHERE STD 39 - [id number] Joint GLENOSPHERE STD 39 [id number] [employer]  Right 1 Implanted  SCREW 5.5X22 - [id number] Screw SCREW 5.5X22  [employer]  Right 1 Implanted  SCREW 5.5X26 - [id number] Screw SCREW 5.5X26  [employer]  Right 2 Implanted  SCREW 5.0X18 - [id number] Screw SCREW 5.0X18  [employer]  Right 1 Implanted  STEM HUMERAL STD SHORT SIZE 2 - [id number] Orthopedic Implant STEM HUMERAL STD SHORT SIZE 2 [id number] [employer]  Right 1 Implanted  INSERT CUP HUM 1/2 39 +0 - [id number] Insert INSERT CUP HUM 1/2 39 +0 [id number] [employer]  Right 1 Implanted    DRAINS: None  CULTURES: None  COMPLICATIONS: none  DESCRIPTION OF PROCEDURE:   Patient was identified in the preoperative holding area.  Anesthesia performed an interscalene nerve block after universal timeout was performed with nursing.  Ancef was given 1 hour prior to skin incision.    The surgical site was scrubbed with a chlorhexidine scrub brush and alcohol.  The patient was  then prepped with chlorhexidine skin prep.  The patient was subsequently taken back to the operating room.  Anesthesia was induced. The patient was transferred to the beachchair position.  All bony prominences were padded.  Final timeout was again performed.     The bony landmarks of the shoulder were marked with a marking pen. A delto-pectoral incision was made, extending up approximately 5 inches. The wound with then irrigated with dilute betadine. Cephalic vein was identified, and an protected. This was retracted medially. Subdeltoid and subpectoral lesions were released. Neurovascular structures were carefully protected. The Gelpi retractor was used to retract the deltoid and pectoralis major.    The deltoid was retracted laterally with a Brown humeral retractor.  The conjoined tendon was identified. The cleido-pectoral fascia was excised.  The axillary nerve was palpated and carefully protected throughout the procedure. The biceps tendon was found and tenodesed to the upper pec with # 2 Ethibond non-absorbable suture.  Proximally the biceps tendon was removed up to the joint.  The bicipital groove was used for a landmark to establish rotator cuff interval. The subscap was tagged with a #2 Ethibond.  At this point the subscapularis was peeled off from the lesser tuberosity with care to avoid dissection distally in order to protect the axillary nerve.  Once the joint was exposed the proximal humerus was delivered with external rotation and extension of the arm. The humerus was prepped initially by performing a humeral neck cut. This was done with the guide using 30 degrees of retroversion as a reference.  The head portion was removed.  A medullary sounding reamer was then used.  We subsequently placed our guidewire through the center of the humeral head using the reference guide.  This was a size 2.  Metaphyseal reamer was then used.  Finally the size 2 broach was malleted into place with excellent purchase.   A tonsil clamp was used to attempt to pull this out with very good purchase   Attention was then turned to the glenoid.  Posteriorly a large Darach retractor was used.  A 360 Degree release of the subscapularis and glenoid were done. The capsule was released from the humerus.    Glenoid retractors were placed posteriorly, superiorly behind the biceps tendon and anteriorly on the glenoid neck. A 360-degree release of the capsule was performed with cautery.  The triceps was released off the inferior tubercle of the glenoid. The axillary nerve was carefully protected with the surgeon's index finger, retracting it and using cautery.   A guidepin was placed through the glenoid guide. The guidepin was drilled until it exited the cortex. The guidepin was over drilled. Next, the glenoid was prepared with the reamer down to cortical bone.  The central peg hole was totally within the scapular neck tested with the probe.  The baseplate was then placed screwed securely with good purchase in position and then secured with 4 screws. In each case, they were drilled and measured and the appropriate length screw placed with excellent rigid fixation of the baseplate. The glenosphere was placed with size based based on pre-operative templating.   The humerus was then delivered and a neutral polyethylene trial was placed.  This was brought to just the level of the reduction but not completely reduced.  A 0 final poly was selected and impacted.    Appropriate tension was noted on the conjoined tendon and deltoid muscle.  Extension was stable, external and internal rotation as well.  The subscap was pulled over but as this was not able to reach comfortably decision was made not to repair in order to prevent limited in external rotation.  The wound was then irrigated. Vancomycin powder was placed in the wound again for infection prevention.   The wound was then closed in layers with 0 Vicryl interrupted in the deep subcu  followed by 2-0 Vicryl in the superficial subcu and 3-0 nylon for skin.  An Aquacel dressing was applied as well as an Veterinary surgeon.  A shoulder immobilizer was applied.     Postoperative Plan: -The patient will begin the reverse shoulder rehab protocol  -Aspirin  325 mg daily will be used for 4 weeks for blood clot prevention -I will see the patient back in 2 weeks for first postoperative wound check    Leon LITTIE Parker, MD 2:59 PM

## 2024-11-24 NOTE — Interval H&P Note (Signed)
 History and Physical Interval Note:  11/24/2024 12:23 PM  Leon Nelson  has presented today for surgery, with the diagnosis of RIGHT ROTATOR CUFF ARTHROPATHY.  The various methods of treatment have been discussed with the patient and family. After consideration of risks, benefits and other options for treatment, the patient has consented to  Procedure(s): ARTHROPLASTY, SHOULDER, TOTAL, REVERSE (Right) as a surgical intervention.  The patient's history has been reviewed, patient examined, no change in status, stable for surgery.  I have reviewed the patient's chart and labs.  Questions were answered to the patient's satisfaction.     [name]

## 2024-11-24 NOTE — Progress Notes (Signed)
Assisted Dr. Edmond Fitzgerald with right, interscalene , ultrasound guided block. Side rails up, monitors on throughout procedure. See vital signs in flow sheet. Tolerated Procedure well. ?

## 2024-11-24 NOTE — Brief Op Note (Signed)
   Brief Op Note  Date of Surgery: 11/24/2024  Preoperative Diagnosis: RIGHT ROTATOR CUFF ARTHROPATHY  Postoperative Diagnosis: same  Procedure: Procedure(s): ARTHROPLASTY, SHOULDER, TOTAL, REVERSE  Implants: Implant Name Type Inv. Item Serial No. Manufacturer Lot No. LRB No. Used Action  SCREW BONE INTRNL SM 7 - [id number] Screw SCREW BONE INTRNL SM 7 [id number] [employer]  Right 1 Implanted  AUGMENT BASEPLATE 15DEG 25 WDG - [id number] Joint AUGMENT BASEPLATE 15DEG 25 WDG [id number] [employer]  Right 1 Implanted  GLENOSPHERE STD 39 - [id number] Joint GLENOSPHERE STD 39 [id number] [employer]  Right 1 Implanted  SCREW 5.5X22 - [id number] Screw SCREW 5.5X22  [employer]  Right 1 Implanted  SCREW 5.5X26 - [id number] Screw SCREW 5.5X26  [employer]  Right 2 Implanted  SCREW 5.0X18 - [id number] Screw SCREW 5.0X18  [employer]  Right 1 Implanted  STEM HUMERAL STD SHORT SIZE 2 - [id number] Orthopedic Implant STEM HUMERAL STD SHORT SIZE 2 [id number] [employer]  Right 1 Implanted  INSERT CUP HUM 1/2 39 +0 - [id number] Insert INSERT CUP HUM 1/2 39 +0 [id number] [employer]  Right 1 Implanted    Surgeons: Surgeon(s): Genelle Standing, MD  Anesthesia: General    Estimated Blood Loss: See anesthesia record  Complications: None  Condition to PACU: Stable  Standing LITTIE Genelle, MD 11/24/2024 2:59 PM

## 2024-11-24 NOTE — Anesthesia Procedure Notes (Signed)
 Procedure Name: Intubation Date/Time: 11/24/2024 1:35 PM  Performed by: Denton Niels CROME, CRNAPre-anesthesia Checklist: Patient identified, Emergency Drugs available, Suction available and Patient being monitored Patient Re-evaluated:Patient Re-evaluated prior to induction Oxygen Delivery Method: Circle system utilized Preoxygenation: Pre-oxygenation with 100% oxygen Induction Type: IV induction Ventilation: Mask ventilation without difficulty Laryngoscope Size: Mac and 3 Grade View: Grade I Tube type: Oral Number of attempts: 1 Placement Confirmation: ETT inserted through vocal cords under direct vision, positive ETCO2 and breath sounds checked- equal and bilateral Secured at: 23.5 cm Tube secured with: Tape Dental Injury: Teeth and Oropharynx as per pre-operative assessment

## 2024-11-24 NOTE — H&P (Signed)
 Chief Complaint: Right shoulder injury        History of Present Illness:    09/23/2024: Presents today for follow-up of his right shoulder.  He is still having very limited overhead active range of motion.  He is here today for MRI discussion     Leon Nelson is a 68 y.o. male who presents today for evaluation of a right shoulder injury.  He unfortunately sustained a fall in the parking lot at [employer] this morning while working on his phone and landed on his right side.  States that he does not have any pain while pressing on his arm however he is unable to lift the arm and this causes a sharp pain.  Denies any numbness or tingling.  He is right-hand dominant.  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     Surgical History:   None   PMH/PSH/Family History/Social History/Meds/Allergies:         Past Medical History:  Diagnosis Date   ABDOMINAL PAIN OTHER SPECIFIED SITE 07/20/2008   CHEST PAIN 02/06/2011   COLONIC POLYPS, HX OF 05/26/2008   DEPRESSION 05/26/2008   DIABETES MELLITUS, TYPE II 05/26/2008   HYPERLIPIDEMIA 05/26/2008   HYPERTENSION 05/26/2008   HYPOTHYROIDISM 05/26/2008   KNEE PAIN, RIGHT, ACUTE 07/01/2008   Personal history of urinary calculi 05/26/2008   Pure hypercholesterolemia 05/19/2024   UTI'S, HX OF 05/26/2008             Past Surgical History:  Procedure Laterality Date   s/p right knee arthroplasty         complicated by patellar tendon rupture Jan 2011 Dr. heide   TONSILLECTOMY            Social History         Socioeconomic History   Marital status: Married      Spouse name: Not on file   Number of children: Not on file   Years of education: Not on file   Highest education level: Some college, no degree  Occupational History   Not on file  Tobacco Use   Smoking status: Never   Smokeless tobacco: Never  Substance and Sexual Activity   Alcohol use: No   Drug use: No   Sexual activity: Yes  Other Topics  Concern   Not on file  Social History Narrative    married    Social Drivers of Acupuncturist Strain: Low Risk  (07/25/2024)    Overall Financial Resource Strain (CARDIA)     Difficulty of Paying Living Expenses: Not hard at all  Food Insecurity: No Food Insecurity (07/25/2024)    Hunger Vital Sign     Worried About Running Out of Food in the Last Year: Never true     Ran Out of Food in the Last Year: Never true  Transportation Needs: No Transportation Needs (07/25/2024)    PRAPARE - Therapist, art (Medical): No     Lack of Transportation (Non-Medical): No  Physical Activity: Inactive (07/25/2024)    Exercise Vital Sign     Days of Exercise per Week: 0 days     Minutes of Exercise per Session: Not on file  Stress: No Stress Concern Present (07/25/2024)    [employer] of Occupational Health - Occupational Stress Questionnaire     Feeling of Stress: Not at all  Social Connections: Socially Integrated (07/25/2024)    Social Connection  and Isolation Panel     Frequency of Communication with Friends and Family: More than three times a week     Frequency of Social Gatherings with Friends and Family: Once a week     Attends Religious Services: More than 4 times per year     Active Member of [employer] or Organizations: Yes     Attends Engineer, structural: More than 4 times per year     Marital Status: Married         Family History  Problem Relation Age of Onset   Cancer Mother          breast cancer   Hyperlipidemia Mother     Heart disease Mother     Hyperlipidemia Father     Heart disease Father     Alcohol abuse Other     Diabetes Other          Allergies       Allergies  Allergen Reactions   Penicillins Other (See Comments)      Blisters on hands   Sulfonamide Derivatives Other (See Comments)      Blisters in groin area            Current Outpatient Medications  Medication Sig Dispense Refill   Accu-Chek Softclix  Lancets lancets USE AS DIRECTED TWICE DAILY 100 each 3   albuterol  (VENTOLIN  HFA) 108 (90 Base) MCG/ACT inhaler Inhale 2 puffs into the lungs every 6 (six) hours as needed for wheezing or shortness of breath. 8 g 11   allopurinol  (ZYLOPRIM ) 300 MG tablet TAKE 1 TABLET BY MOUTH DAILY 100 tablet 2   amitriptyline  (ELAVIL ) 50 MG tablet 1 - 2 tabs by mouth at bedtime for nerve pain 60 tablet 5   aspirin  EC 81 MG tablet Take 1 tablet (81 mg total) by mouth daily. Swallow whole.       atenolol -chlorthalidone  (TENORETIC ) 50-25 MG tablet TAKE ONE-HALF TABLET BY MOUTH  DAILY 50 tablet 2   Blood Glucose Calibration (ACCU-CHEK GUIDE CONTROL) LIQD Use as directed once daily as needed E11.9 1 each 1   Blood Glucose Monitoring Suppl (ACCU-CHEK GUIDE ME) w/Device KIT Use as directed up to four times per day E11.9 1 kit 0   Continuous Glucose Receiver (FREESTYLE LIBRE 3 READER) DEVI Use as directed four times per day E11.9 1 each 0   Continuous Glucose Sensor (FREESTYLE LIBRE 3 PLUS SENSOR) MISC Change sensor every 15 days. E11.9 6 each 3   dapagliflozin  propanediol (FARXIGA ) 10 MG TABS tablet Take 1 tablet (10 mg total) by mouth daily before breakfast. 90 tablet 3   diclofenac  sodium (VOLTAREN ) 1 % GEL Apply 2 g topically 4 (four) times daily as needed. 200 g 5   dicyclomine  (BENTYL ) 20 MG tablet TAKE 1 TABLET BY MOUTH 3 TIMES  DAILY AS NEEDED 300 tablet 3   gabapentin  (NEURONTIN ) 300 MG capsule TAKE 1 CAPSULE BY MOUTH TWICE  DAILY 200 capsule 1   glipiZIDE  (GLUCOTROL  XL) 10 MG 24 hr tablet TAKE 1 TABLET BY MOUTH DAILY  WITH BREAKFAST 100 tablet 3   ketoconazole (NIZORAL) 2 % shampoo Apply 1 Application topically 3 (three) times a week.       levothyroxine  (SYNTHROID ) 50 MCG tablet TAKE 1 TABLET BY MOUTH DAILY 100 tablet 3   lisinopril  (ZESTRIL ) 20 MG tablet TAKE 1 TABLET BY MOUTH DAILY 100 tablet 2   metFORMIN  (GLUCOPHAGE -XR) 500 MG 24 hr tablet TAKE 2 TABLETS BY MOUTH TWICE  DAILY WITH  A MEAL 400 tablet 2    ONETOUCH ULTRA test strip USE AS INSTRUCTED ONCE DAILY 100 strip 2   potassium chloride  SA (KLOR-CON  M) 20 MEQ tablet TAKE 2 TABLETS BY MOUTH DAILY 200 tablet 2   rosuvastatin  (CRESTOR ) 40 MG tablet TAKE 1 TABLET BY MOUTH DAILY 90 tablet 3   Semaglutide , 1 MG/DOSE, 4 MG/3ML SOPN Inject 1 mg as directed once a week. 9 mL 3   solifenacin  (VESICARE ) 5 MG tablet Take 1 tablet by mouth once daily 90 tablet 0   venlafaxine  XR (EFFEXOR -XR) 75 MG 24 hr capsule TAKE 1 CAPSULE BY MOUTH DAILY 100 capsule 2      No current facility-administered medications for this visit.      Imaging Results (Last 48 hours)  No results found.     Review of Systems:   A ROS was performed including pertinent positives and negatives as documented in the HPI.   Physical Exam :   Constitutional: NAD and appears stated age Neurological: Alert and oriented Psych: Appropriate affect and cooperative There were no vitals taken for this visit.    Comprehensive Musculoskeletal Exam:     Exam remains unchanged.  No significant tenderness or ecchymosis of the right shoulder.  Patient is only able to perform 30 degrees of active forward flexion and 20 degrees of external rotation, compared to 160/30 on contralateral side.  Distal motor and neurosensory exam is intact.  Radial pulse 2+.   Imaging:   MRI right shoulder: Complete tears of the supraspinatus and infraspinatus with retraction.  Likely partial tears of the biceps long head and infraspinatus tendons.  No evidence of fracture.     I personally reviewed and interpreted the radiographs.     Assessment:   68 y.o. male with a recent fall onto his right shoulder.  MRI confirms complete supraspinatus and infraspinatus tears.  Patient is still a moderate amount of discomfort and is unable to perform much motion of the shoulder particularly with flexion.  I did go ahead and discuss the outlook of nonoperative treatment which would ultimately need to decrease pain however  function would remain very limited.  At this time given the acuity of the injury we did discuss treatment options.  I do not believe that a rotator cuff repair would lead to a very viable function given the status of the tendon and the significant retraction.  Given this we did discuss reverse shoulder arthroplasty.  I did discuss the risks and benefits as well as limitations.  After discussion he would like to proceed   Plan :     - Plan for right shoulder reverse shoulder arthroplasty   After a lengthy discussion of treatment options, including risks, benefits, alternatives, complications of surgical and nonsurgical conservative options, the patient elected surgical repair.   The patient  is aware of the material risks  and complications including, but not limited to injury to adjacent structures, neurovascular injury, infection, numbness, bleeding, implant failure, thermal burns, stiffness, persistent pain, failure to heal, disease transmission from allograft, need for further surgery, dislocation, anesthetic risks, blood clots, risks of death,and others. The probabilities of surgical success and failure discussed with patient given their particular co-morbidities.The time and nature of expected rehabilitation and recovery was discussed.The patient's questions were all answered preoperatively.  No barriers to understanding were noted. I explained the natural history of the disease process and Rx rationale.  I explained to the patient what I considered to be reasonable expectations given their personal  situation.  The final treatment plan was arrived at through a shared patient decision making process model.          I personally saw and evaluated the patient, and participated in the management and treatment plan.

## 2024-11-24 NOTE — Anesthesia Postprocedure Evaluation (Signed)
 Anesthesia Post Note  Patient: Leon Nelson  Procedure(s) Performed: ARTHROPLASTY, SHOULDER, TOTAL, REVERSE (Right: Shoulder)     Patient location during evaluation: PACU Anesthesia Type: General Level of consciousness: awake and alert Pain management: pain level controlled Vital Signs Assessment: post-procedure vital signs reviewed and stable Respiratory status: spontaneous breathing, nonlabored ventilation and respiratory function stable Cardiovascular status: stable and blood pressure returned to baseline Anesthetic complications: no   No notable events documented.  Last Vitals:  Vitals:   11/24/24 1545 11/24/24 1603  BP: 95/67 95/69  Pulse:  85  Resp: 13 14  Temp:  36.5 C  SpO2:  96%    Last Pain:  Vitals:   11/24/24 1603  TempSrc:   PainSc: 0-No pain                 Debby FORBES Like

## 2024-11-24 NOTE — Transfer of Care (Signed)
 Immediate Anesthesia Transfer of Care Note  Patient: Georganna LITTIE Dollar  Procedure(s) Performed: ARTHROPLASTY, SHOULDER, TOTAL, REVERSE (Right: Shoulder)  Patient Location: PACU  Anesthesia Type:GA combined with regional for post-op pain  Level of Consciousness: awake, alert , oriented, and patient cooperative  Airway & Oxygen Therapy: Patient Spontanous Breathing and Patient connected to face mask oxygen  Post-op Assessment: Report given to RN and Post -op Vital signs reviewed and stable  Post vital signs: Reviewed and stable  Last Vitals:  Vitals Value Taken Time  BP 107/69 11/24/24 15:15  Temp 36.7 C 11/24/24 15:14  Pulse 66 11/24/24 15:21  Resp 13 11/24/24 15:21  SpO2 98 % 11/24/24 15:21  Vitals shown include unfiled device data.  Last Pain:  Vitals:   11/24/24 1514  TempSrc:   PainSc: 0-No pain      Patients Stated Pain Goal: 4 (11/24/24 1130)  Complications: No notable events documented.

## 2024-11-24 NOTE — Discharge Instructions (Addendum)
 Discharge Instructions    Attending Surgeon: Elspeth Parker, MD Office Phone Number: [phone]   Diagnosis and Procedures:    Surgeries Performed: Right shoulder reverse shoulder arthoplasty  Discharge Plan:    Diet: Resume usual diet. Begin with light or bland foods.  Drink plenty of fluids.  Activity:  Keep sling and dressing in place until your follow up visit in Physical Therapy You are advised to go home directly from the hospital or surgical center. Restrict your activities.  GENERAL INSTRUCTIONS: 1.  Keep your surgical site elevated above your heart for at least 5-7 days or longer to prevent swelling. This will improve your comfort and your overall recovery following surgery.     2. Please call Dr. Danetta office at [phone] with questions Monday-Friday during business hours. If no one answers, please leave a message and someone should get back to the patient within 24 hours. For emergencies please call 911 or proceed to the emergency room.   3. Patient to notify surgical team if experiences any of the following: Bowel/Bladder dysfunction, uncontrolled pain, nerve/muscle weakness, incision with increased drainage or redness, nausea/vomiting and Fever greater than 101.0 F.  Be alert for signs of infection including redness, streaking, odor, fever or chills. Be alert for excessive pain or bleeding and notify your surgeon immediately.  WOUND INSTRUCTIONS:   Leave your dressing/cast/splint in place until your post operative visit.  Keep it clean and dry.  Always keep the incision clean and dry until the staples/sutures are removed. If there is no drainage from the incision you should keep it open to air. If there is drainage from the incision you must keep it covered at all times until the drainage stops  Do not soak in a bath tub, hot tub, pool, lake or other body of water until 21 days after your surgery and your incision is completely dry and healed.  If you  have removable sutures (or staples) they must be removed 10-14 days (unless otherwise instructed) from the day of your surgery.     1)  Elevate the extremity as much as possible.  2)  Keep the dressing clean and dry.  3)  Please call us  if the dressing becomes wet or dirty.  4)  If you are experiencing worsening pain or worsening swelling, please call.     MEDICATIONS: Resume all previous home medications at the previous prescribed dose and frequency unless otherwise noted Start taking the  pain medications on an as-needed basis as prescribed  Please taper down pain medication over the next week following surgery.  Ideally you should not require a refill of any narcotic pain medication.  Take pain medication with food to minimize nausea. In addition to the prescribed pain medication, you may take over-the-counter pain relievers such as Tylenol .  Do NOT take additional tylenol  if your pain medication already has tylenol  in it.  Aspirin  325mg  daily per bottle instructions. Narcotic Policy: Per [facility] clinic policy, our goal is ensure optimal postoperative pain control with a multimodal pain management strategy. For all [facility] patients, our goal is to wean post-operative narcotic medications by 6 weeks post-operatively, and many times sooner. If this is not possible due to utilization of pain medication prior to surgery, your [facility] doctor will support your acute post-operative pain control for the first 6 weeks postoperatively, with a plan to transition you back to your primary pain team following that. Maralee will work to ensure a therapist, occupational.  FOLLOWUP INSTRUCTIONS: 1. Follow up at the Physical Therapy Clinic 3-4 days following surgery. This appointment should be scheduled unless other arrangements have been made.The Physical Therapy scheduling number is [phone] if an appointment has not already been arranged.  2. Contact Dr. Danetta office during office hours at  [phone] or the practice after hours line at [phone] for non-emergencies. For medical emergencies call 911.   Discharge Location: Home  No Tylenol  before 5:30pm today.   Post Anesthesia Home Care Instructions  Activity: Get plenty of rest for the remainder of the day. A responsible individual must stay with you for 24 hours following the procedure.  For the next 24 hours, DO NOT: -Drive a car -Advertising copywriter -Drink alcoholic beverages -Take any medication unless instructed by your physician -Make any legal decisions or sign important papers.  Meals: Start with liquid foods such as gelatin or soup. Progress to regular foods as tolerated. Avoid greasy, spicy, heavy foods. If nausea and/or vomiting occur, drink only clear liquids until the nausea and/or vomiting subsides. Call your physician if vomiting continues.  Special Instructions/Symptoms: Your throat may feel dry or sore from the anesthesia or the breathing tube placed in your throat during surgery. If this causes discomfort, gargle with warm salt water. The discomfort should disappear within 24 hours.  If you had a scopolamine  patch placed behind your ear for the management of post- operative nausea and/or vomiting:  1. The medication in the patch is effective for 72 hours, after which it should be removed.  Wrap patch in a tissue and discard in the trash. Wash hands thoroughly with soap and water. 2. You may remove the patch earlier than 72 hours if you experience unpleasant side effects which may include dry mouth, dizziness or visual disturbances. 3. Avoid touching the patch. Wash your hands with soap and water after contact with the patch.   Regional Anesthesia Blocks  1. You may not be able to move or feel the blocked extremity after a regional anesthetic block. This may last may last from 3-48 hours after placement, but it will go away. The length of time depends on the medication injected and your  individual response to the medication. As the nerves start to wake up, you may experience tingling as the movement and feeling returns to your extremity. If the numbness and inability to move your extremity has not gone away after 48 hours, please call your surgeon.   2. The extremity that is blocked will need to be protected until the numbness is gone and the strength has returned. Because you cannot feel it, you will need to take extra care to avoid injury. Because it may be weak, you may have difficulty moving it or using it. You may not know what position it is in without looking at it while the block is in effect.  3. For blocks in the legs and feet, returning to weight bearing and walking needs to be done carefully. You will need to wait until the numbness is entirely gone and the strength has returned. You should be able to move your leg and foot normally before you try and bear weight or walk. You will need someone to be with you when you first try to ensure you do not fall and possibly risk injury.  4. Bruising and tenderness at the needle site are common side effects and will resolve in a few days.  5. Persistent numbness or new problems with movement should be communicated to the  surgeon or the [facility] [phone] [facility] [phone]).Information for Discharge Teaching: EXPAREL (bupivacaine liposome injectable suspension)   Pain relief is important to your recovery. The goal is to control your pain so you can move easier and return to your normal activities as soon as possible after your procedure. Your physician may use several types of medicines to manage pain, swelling, and more.  Your surgeon or anesthesiologist gave you EXPAREL(bupivacaine) to help control your pain after surgery.  EXPAREL is a local anesthetic designed to release slowly over an extended period of time to provide pain relief by numbing the tissue around the surgical site. EXPAREL  is designed to release pain medication over time and can control pain for up to 72 hours. Depending on how you respond to EXPAREL, you may require less pain medication during your recovery. EXPAREL can help reduce or eliminate the need for opioids during the first few days after surgery when pain relief is needed the most. EXPAREL is not an opioid and is not addictive. It does not cause sleepiness or sedation.   Important! A teal colored band has been placed on your arm with the date, time and amount of EXPAREL you have received. Please leave this armband in place for the full 96 hours following administration, and then you may remove the band. If you return to the hospital for any reason within 96 hours following the administration of EXPAREL, the armband provides important information that your health care providers to know, and alerts them that you have received this anesthetic.    Possible side effects of EXPAREL: Temporary loss of sensation or ability to move in the area where medication was injected. Nausea, vomiting, constipation Rarely, numbness and tingling in your mouth or lips, lightheadedness, or anxiety may occur. Call your doctor right away if you think you may be experiencing any of these sensations, or if you have other questions regarding possible side effects.  Follow all other discharge instructions given to you by your surgeon or nurse. Eat a healthy diet and drink plenty of water or other fluids.

## 2024-11-24 NOTE — Anesthesia Procedure Notes (Signed)
 Anesthesia Regional Block: Interscalene brachial plexus block   Pre-Anesthetic Checklist: , timeout performed,  Correct Patient, Correct Site, Correct Laterality,  Correct Procedure, Correct Position, site marked,  Risks and benefits discussed,  Pre-op evaluation,  At surgeon's request and post-op pain management  Laterality: Left  Prep: Maximum Sterile Barrier Precautions used, chloraprep       Needles:  Injection technique: Single-shot  Needle Type: Echogenic Stimulator Needle     Needle Length: 5cm  Needle Gauge: 22     Additional Needles:   Procedures:, nerve stimulator,,, ultrasound used (permanent image in chart),,     Nerve Stimulator or Paresthesia:  Response: Biceps response  Additional Responses:   Narrative:  Start time: 11/24/2024 11:44 AM End time: 11/24/2024 11:54 AM Injection made incrementally with aspirations every 5 mL.  Performed by: Personally  Anesthesiologist: Epifanio Fallow, MD

## 2024-11-25 ENCOUNTER — Encounter (HOSPITAL_BASED_OUTPATIENT_CLINIC_OR_DEPARTMENT_OTHER): Payer: Self-pay | Admitting: Orthopaedic Surgery

## 2024-11-25 NOTE — Therapy (Signed)
 OUTPATIENT PHYSICAL THERAPY SHOULDER EVALUATION   Patient Name: Leon Nelson MRN: [id number] DOB:Feb 10, 1956, 68 y.o., male Today's Date: 11/30/2024  END OF SESSION:  PT End of Session - 11/30/24 1100     Visit Number 1    Number of Visits 24    Date for Recertification  02/22/25    Authorization Type not available at eval    PT Start Time 1100    PT Stop Time 1145    PT Time Calculation (min) 45 min    Activity Tolerance Patient tolerated treatment well          Past Medical History:  Diagnosis Date   ABDOMINAL PAIN OTHER SPECIFIED SITE 07/20/2008   Arthritis Currently   CHEST PAIN 02/06/2011   COLONIC POLYPS, HX OF 05/26/2008   DEPRESSION 05/26/2008   DIABETES MELLITUS, TYPE II 05/26/2008   HYPERLIPIDEMIA 05/26/2008   HYPERTENSION 05/26/2008   HYPOTHYROIDISM 05/26/2008   KNEE PAIN, RIGHT, ACUTE 07/01/2008   Personal history of urinary calculi 05/26/2008   Pure hypercholesterolemia 05/19/2024   UTI'S, HX OF 05/26/2008   Past Surgical History:  Procedure Laterality Date   ANKLE SURGERY Left    JOINT REPLACEMENT  09/2009   Right Knee   REVERSE SHOULDER ARTHROPLASTY Right 11/24/2024   Procedure: ARTHROPLASTY, SHOULDER, TOTAL, REVERSE;  Surgeon: Genelle Standing, MD;  Location: [facility];  Service: Orthopedics;  Laterality: Right;   s/p right knee arthroplasty      complicated by patellar tendon rupture Jan 2011 Dr. heide   TONSILLECTOMY     Patient Active Problem List   Diagnosis Date Noted   Traumatic complete tear of right rotator cuff 11/24/2024   Fatty liver 07/29/2024   Joint pain 05/19/2024   Low back pain 05/19/2024   Thrombosed external hemorrhoids 05/19/2024   Work stress 04/09/2024   Coronary artery disease due to lipid rich plaque 04/09/2024   DOE (dyspnea on exertion) 04/07/2024   Ascending aortic aneurysm 03/17/2024   Neuropathy 02/02/2024   Changes in vision 09/14/2023   Urinary frequency 01/23/2023   Constipation  01/23/2023   RUQ pain 04/24/2021   Vitamin D  deficiency 04/24/2021   Balance disorder 06/22/2020   Dizziness 06/22/2020   Irritable bowel syndrome, unspecified 01/15/2019   Rhabdomyolysis 01/08/2019   Left cervical radiculopathy 01/27/2018   Left hand paresthesia 01/27/2018   Hypokalemia 03/27/2017   Heart murmur previously undiagnosed 08/14/2011   Diabetes mellitus type 2 with neurological manifestations (HCC) 05/26/2008   Essential hypertension 05/26/2008   History of colonic polyps 05/26/2008    PCP: Dr [name]  REFERRING PROVIDER: Dr [name]  REFERRING DIAG: R Reverse TSA  THERAPY DIAG:  Acute pain of right shoulder  Other symptoms and signs involving the musculoskeletal system  Muscle weakness (generalized)  Rationale for Evaluation and Treatment: Rehabilitation  ONSET DATE: 11/24/24  SUBJECTIVE:  SUBJECTIVE STATEMENT: Patient reports that he had a total tear of the rotator cuff in his R shoulder. Patient reports that he fell 07/27/24, landing body weight on the R shoulder. He had pain and was unable to lift R UE or use arm. Diagnostic tests revealed torn rotator cuff. Patient underwent reverse total shoulder replacement 11/24/24. Post op course has been uncomplicated. He has continued pain at 7-8/10 level. He is sleeping in the recliner.    Hand dominance: Right  PERTINENT HISTORY: Type II diabetes; hyperlipidemia; HTN; hypothyroidism: R knee pain; abdominal pain; depression; R TKA; ankle stabilization surgery 2020  PAIN:  Are you having pain? Yes: NPRS scale: 7-8/10  Pain location: R shoulder Pain description: solid pain; constant; intensity worse with movement  Aggravating factors: movement  Relieving factors: advil ; tylonol;   PRECAUTIONS: Shoulder R reverse TSA per  protocol   RED FLAGS: None   WEIGHT BEARING RESTRICTIONS: Yes   FALLS:  Has patient fallen in last 6 months? Yes. 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  DIAGNOSTIC FINDINGS:  CT R shoulder 09/30/24: 1. Moderate glenohumeral joint osteoarthritis. 2. Severe acromioclavicular joint osteoarthritis. 3. Mild atrophy of the subscapularis and infraspinatus muscles.  PATIENT SURVEYS: (survey based on patient's subjective report of symptoms and activity level)  Quick DASH - 88.6/100; 88.6%   COGNITION: Overall cognitive status: Within functional limits for tasks assessed     SENSATION: WFL  POSTURE: Head forward; shoulders rounded and elevated; increased thoracic kyphosis  UPPER EXTREMITY ROM: R hand, wrist, forearm, elbow WFL's    R shoulder ROM not assessed today    L UE WFL's   Passive ROM Right eval Left eval  Shoulder flexion    Shoulder extension    Shoulder abduction    Shoulder adduction    Shoulder internal rotation    Shoulder external rotation    Elbow flexion    Elbow extension    Wrist flexion    Wrist extension    Wrist ulnar deviation    Wrist radial deviation    Wrist pronation    Wrist supination    (Blank rows = not tested)  UPPER EXTREMITY MMT: not assessed   MMT Right eval Left eval  Shoulder flexion    Shoulder extension    Shoulder abduction    Shoulder adduction    Shoulder internal rotation    Shoulder external rotation    Middle  trapezius    Lower trapezius    Elbow flexion    Elbow extension    Wrist flexion    Wrist extension    Wrist ulnar deviation    Wrist radial deviation    Wrist pronation    Wrist supination    Grip strength (lbs)    (Blank rows = not tested)  PALPATION:   Muscular tightness upper trap  TREATMENT DATE: evaluation findings; POC; initial HEP; precautions    PATIENT EDUCATION: Education details: POC;HEP  Person educated: Patient Education method: Explanation, Demonstration, Tactile cues, Verbal cues, and Handouts Education comprehension: verbalized understanding, returned demonstration, verbal cues required, tactile cues required, and needs further education  HOME EXERCISE PROGRAM: Access Code: [email or web address] URL: [email or web address] Date: 11/30/2024 Prepared by: Kiala Faraj  Exercises - Seated Scapular Retraction  - 2 x daily - 7 x weekly - 1-2 sets - 10 reps - 10 sec  hold - Hand Active Tabletop Fist Series  - 2-3 x daily - 7 x weekly - 1 sets - 5-10 reps - Wrist AROM Flexion Extension  - 2-3 x daily - 7 x weekly - 1 sets - 5-10 reps - Wrist Circumduction AROM  - 2-3 x daily - 7 x weekly - 1 sets - 10 reps - Seated Forearm Pronation and Supination AROM  - 2-3 x daily - 7 x weekly - 1 sets - 10 reps - Wrist AROM Radial Ulnar Deviation  - 2-3 x daily - 7 x weekly - 1 sets - 10 reps - Seated Elbow Flexion and Extension AROM  - 2-3 x daily - 7 x weekly - 1 sets - 5 reps  ASSESSMENT:  CLINICAL IMPRESSION: Patient is a 68 y.o. male who was seen today for physical therapy evaluation and treatment s/p R TSA 11/24/24 following a fall with injury 07/27/24. He has been out of the sling since ~ the second post op day per MD instructions. Discussed supporting R UE when out of the sling to allow musculature of R shoulder girdle to rest and heal without need  to support the weight of the UE. Patient presents with rounded posture and alignent through the R shoulder girdle and thoracic spine; limited ROM, strength, function of R UE following reverse TSA. Of concern, he continues to report pain at 7-8/10 on a daily basis. Patient has decreased functional activities and ADL's. Walt will benefit from PT to address deficits and progress with shoulder rehab.   OBJECTIVE IMPAIRMENTS: decreased activity tolerance, decreased mobility, decreased ROM, decreased strength, increased muscle spasms, impaired UE functional use, improper body mechanics, postural dysfunction, and pain.   ACTIVITY LIMITATIONS: carrying, lifting, bending, sitting, standing, squatting, sleeping, transfers, bed mobility, bathing, toileting, dressing, and reach over head  PARTICIPATION LIMITATIONS: meal prep, cleaning, laundry, driving, shopping, community activity, occupation, and yard work  PERSONAL FACTORS: Fitness, Past/current experiences, Time since onset of injury/illness/exacerbation, and comorbidities as noted above are also affecting patient's functional outcome.   REHAB POTENTIAL: Good  CLINICAL DECISION MAKING: Evolving/moderate complexity  EVALUATION COMPLEXITY: Moderate   GOALS: Goals reviewed with patient? Yes  SHORT TERM GOALS: Target date: 01/11/2025   Independent in initial HEP  Baseline: Goal status: INITIAL  2.  Improve posture and alignment through shoulder girdle with patient demonstrating activation of posterior shoulder girdle musculature to improve shoulder function  Baseline:  Goal status: INITIAL  3.  P/AAROM R shoulder to 130 deg elevation and 30 deg ER in scapular plane Baseline:  Goal status: INITIAL   LONG TERM GOALS: Target date: 02/22/2025   Progress shoulder rehab per protocol or as directed by MD  Baseline:  Goal status: INITIAL  2.  PROM 145 deg elevation and 40 deg ER in scapular plane; functional IR to L! Baseline:  Goal status:  INITIAL  3.  Decrease pain to 0/10 to no more than 3/10  Baseline:  Goal status: INITIAL  4.  Return to functional activities  using R UE including return to work at desk/computer/phone  Baseline:  Goal status: INITIAL  5.  Improve Quick DASH score by 10% indicating improved functional activities  Baseline:  Goal status: INITIAL  6.  Independent in advanced HEP  Baseline:  Goal status: INITIAL  PLAN:  PT FREQUENCY: 2x/week  PT DURATION: 12 weeks  PLANNED INTERVENTIONS: 97164- PT Re-evaluation, 97110-Therapeutic exercises, 97530- Therapeutic activity, 97112- Neuromuscular re-education, 97535- Self Care, [id number]- Manual therapy, [phone]- Aquatic Therapy, Patient/Family education, and Taping  PLAN FOR NEXT SESSION: review and progress exercise per protocol; manual work and modalities as indicated; progress rehab per protocol and/or as ordered    Sheketa Ende P Ky Moskowitz, PT 11/30/2024, 12:39 PM

## 2024-11-27 ENCOUNTER — Telehealth: Payer: Self-pay | Admitting: *Deleted

## 2024-11-27 NOTE — Telephone Encounter (Signed)
 Patient called and states he is concerned regarding continued numbness after his surgery Tuesday of this week. He is numb down to the elbow and completely numb on the ring and little finger of Right side. He also says he got conflicting information and wanted to make sure he was doing the correct thing. Asked about dressing and if he should remove; also asked if it was waterproof and if he could shower? My shoulder instructions allow that, but you may have different instructions for your patients. He just wants clarification.Thanks.

## 2024-11-29 ENCOUNTER — Other Ambulatory Visit: Payer: Self-pay | Admitting: Internal Medicine

## 2024-11-29 DIAGNOSIS — I10 Essential (primary) hypertension: Secondary | ICD-10-CM

## 2024-11-30 ENCOUNTER — Encounter: Payer: Self-pay | Admitting: Rehabilitative and Restorative Service Providers"

## 2024-11-30 ENCOUNTER — Other Ambulatory Visit: Payer: Self-pay

## 2024-11-30 ENCOUNTER — Ambulatory Visit: Admitting: Rehabilitative and Restorative Service Providers"

## 2024-11-30 DIAGNOSIS — R29898 Other symptoms and signs involving the musculoskeletal system: Secondary | ICD-10-CM | POA: Diagnosis present

## 2024-11-30 DIAGNOSIS — S46011A Strain of muscle(s) and tendon(s) of the rotator cuff of right shoulder, initial encounter: Secondary | ICD-10-CM | POA: Diagnosis not present

## 2024-11-30 DIAGNOSIS — M25511 Pain in right shoulder: Secondary | ICD-10-CM | POA: Diagnosis present

## 2024-11-30 DIAGNOSIS — M6281 Muscle weakness (generalized): Secondary | ICD-10-CM | POA: Insufficient documentation

## 2024-12-02 ENCOUNTER — Encounter: Payer: Self-pay | Admitting: Rehabilitative and Restorative Service Providers"

## 2024-12-02 ENCOUNTER — Ambulatory Visit: Admitting: Rehabilitative and Restorative Service Providers"

## 2024-12-02 DIAGNOSIS — M25511 Pain in right shoulder: Secondary | ICD-10-CM | POA: Diagnosis not present

## 2024-12-02 DIAGNOSIS — M6281 Muscle weakness (generalized): Secondary | ICD-10-CM

## 2024-12-02 DIAGNOSIS — R29898 Other symptoms and signs involving the musculoskeletal system: Secondary | ICD-10-CM

## 2024-12-02 NOTE — Therapy (Signed)
 OUTPATIENT PHYSICAL THERAPY SHOULDER TREATMENT   Patient Name: Leon Nelson MRN: [id number] DOB:13-Jan-1956, 68 y.o., male Today's Date: 12/02/2024  END OF SESSION:  PT End of Session - 12/02/24 1407     Visit Number 2    Number of Visits 24    Date for Recertification  02/22/25    Authorization Type [employer] medicare auth required $20 copay    PT Start Time 1400    PT Stop Time 1445    PT Time Calculation (min) 45 min    Activity Tolerance Patient tolerated treatment well          Past Medical History:  Diagnosis Date   ABDOMINAL PAIN OTHER SPECIFIED SITE 07/20/2008   Arthritis Currently   CHEST PAIN 02/06/2011   COLONIC POLYPS, HX OF 05/26/2008   DEPRESSION 05/26/2008   DIABETES MELLITUS, TYPE II 05/26/2008   HYPERLIPIDEMIA 05/26/2008   HYPERTENSION 05/26/2008   HYPOTHYROIDISM 05/26/2008   KNEE PAIN, RIGHT, ACUTE 07/01/2008   Personal history of urinary calculi 05/26/2008   Pure hypercholesterolemia 05/19/2024   UTI'S, HX OF 05/26/2008   Past Surgical History:  Procedure Laterality Date   ANKLE SURGERY Left    JOINT REPLACEMENT  09/2009   Right Knee   REVERSE SHOULDER ARTHROPLASTY Right 11/24/2024   Procedure: ARTHROPLASTY, SHOULDER, TOTAL, REVERSE;  Surgeon: Genelle Standing, MD;  Location: [facility];  Service: Orthopedics;  Laterality: Right;   s/p right knee arthroplasty      complicated by patellar tendon rupture Jan 2011 Dr. heide   TONSILLECTOMY     Patient Active Problem List   Diagnosis Date Noted   Traumatic complete tear of right rotator cuff 11/24/2024   Fatty liver 07/29/2024   Joint pain 05/19/2024   Low back pain 05/19/2024   Thrombosed external hemorrhoids 05/19/2024   Work stress 04/09/2024   Coronary artery disease due to lipid rich plaque 04/09/2024   DOE (dyspnea on exertion) 04/07/2024   Ascending aortic aneurysm 03/17/2024   Neuropathy 02/02/2024   Changes in vision 09/14/2023   Urinary frequency 01/23/2023    Constipation 01/23/2023   RUQ pain 04/24/2021   Vitamin D  deficiency 04/24/2021   Balance disorder 06/22/2020   Dizziness 06/22/2020   Irritable bowel syndrome, unspecified 01/15/2019   Rhabdomyolysis 01/08/2019   Left cervical radiculopathy 01/27/2018   Left hand paresthesia 01/27/2018   Hypokalemia 03/27/2017   Heart murmur previously undiagnosed 08/14/2011   Diabetes mellitus type 2 with neurological manifestations (HCC) 05/26/2008   Essential hypertension 05/26/2008   History of colonic polyps 05/26/2008    PCP: Dr [name]  REFERRING PROVIDER: Dr [name]  REFERRING DIAG: R Reverse TSA  THERAPY DIAG:  Acute pain of right shoulder  Other symptoms and signs involving the musculoskeletal system  Muscle weakness (generalized)  Rationale for Evaluation and Treatment: Rehabilitation  ONSET DATE: 11/24/24  SUBJECTIVE:  SUBJECTIVE STATEMENT: Doing much better today. Has been in the sling except when bathing and dressing. Having a lot less pain in the R shoulder. Working on his exercise at home. Still has numbness and tingling in ring and litter fingers of R hand but less noticeable - better than Monday. Still can't get comfortable to sleep - tried lying on couch the last couple of nights and that has not helped with sleeping.    EVAL: Patient reports that he had a total tear of the rotator cuff in his R shoulder. Patient reports that he fell 07/27/24, landing body weight on the R shoulder. He had pain and was unable to lift R UE or use arm. Diagnostic tests revealed torn rotator cuff. Patient underwent reverse total shoulder replacement 11/24/24. Post op course has been uncomplicated. He has continued pain at 7-8/10 level. He is sleeping in the recliner.    Hand dominance: Right  PERTINENT  HISTORY: Type II diabetes; hyperlipidemia; HTN; hypothyroidism: R knee pain; abdominal pain; depression; R TKA; ankle stabilization surgery 2020  PAIN:  Are you having pain? Yes: NPRS scale: 2/10  Pain location: R shoulder Pain description: solid pain; constant; intensity worse with movement  Aggravating factors: movement  Relieving factors: advil ; tylonol;   PRECAUTIONS: Shoulder R reverse TSA per protocol    WEIGHT BEARING RESTRICTIONS: Yes   FALLS:  Has patient fallen in last 6 months? Yes. 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  DIAGNOSTIC FINDINGS:  CT R shoulder 09/30/24: 1. Moderate glenohumeral joint osteoarthritis. 2. Severe acromioclavicular joint osteoarthritis. 3. Mild atrophy of the subscapularis and infraspinatus muscles.  PATIENT SURVEYS: (survey based on patient's subjective report of symptoms and activity level)  Quick DASH - 88.6/100; 88.6%       SENSATION: WFL  POSTURE: Head forward; shoulders rounded and elevated; increased thoracic kyphosis  UPPER EXTREMITY ROM: R hand, wrist, forearm, elbow WFL's    R shoulder ROM not assessed today    L UE WFL's   Passive ROM Right 12/02/24 Left eval  Shoulder flexion 75 deg   Shoulder extension    Shoulder abduction (scaption) 40   Shoulder adduction    Shoulder internal rotation    Shoulder external rotation 20    Elbow flexion    Elbow extension    Wrist flexion    Wrist extension    Wrist ulnar deviation    Wrist radial  deviation    Wrist pronation    Wrist supination    (Blank rows = not tested)  UPPER EXTREMITY MMT: not assessed   MMT Right eval Left eval  Shoulder flexion    Shoulder extension    Shoulder abduction    Shoulder adduction    Shoulder internal rotation    Shoulder external rotation    Middle trapezius    Lower trapezius    Elbow flexion    Elbow extension    Wrist flexion    Wrist extension    Wrist ulnar deviation    Wrist radial deviation  Wrist pronation    Wrist supination    Grip strength (lbs)    (Blank rows = not tested)  PALPATION:   Muscular tightness upper trap   [facility] Adult PT Treatment:                                                DATE: 12/02/24 Therapeutic Exercise: AROM R hand, wrist, forearm, elbow Pendulum flexion; horizontal ab/adduction; circles CW/CCW x 15 each  Manual Therapy: (patient sitting) STM R upper trap; forearm area  PROM R shoulder flexion; scaption; ER in scapular plane  Neuromuscular re-ed: Chin tuck 5 sec x 10  Scap squeeze 5 sec x 10  Therapeutic Activity:  Modalities: To ice at home  Self Care: Continue with sling for comfort and UE support Can remove sling with R UE supported at side on pillow                                                                                                                              TREATMENT DATE: evaluation findings; POC; initial HEP; precautions    PATIENT EDUCATION: Education details: POC;HEP  Person educated: Patient Education method: Explanation, Demonstration, Tactile cues, Verbal cues, and Handouts Education comprehension: verbalized understanding, returned demonstration, verbal cues required, tactile cues required, and needs further education  HOME EXERCISE PROGRAM: Access Code: [email or web address] URL: [email or web address] Date: 12/02/2024 Prepared by: Scotty Pinder  Exercises - Seated Scapular Retraction  - 2 x daily - 7 x weekly - 1-2 sets - 10 reps - 10 sec   hold - Hand Active Tabletop Fist Series  - 2-3 x daily - 7 x weekly - 1 sets - 5-10 reps - Wrist AROM Flexion Extension  - 2-3 x daily - 7 x weekly - 1 sets - 5-10 reps - Wrist Circumduction AROM  - 2-3 x daily - 7 x weekly - 1 sets - 10 reps - Seated Forearm Pronation and Supination AROM  - 2-3 x daily - 7 x weekly - 1 sets - 10 reps - Wrist AROM Radial Ulnar Deviation  - 2-3 x daily - 7 x weekly - 1 sets - 10 reps - Seated Elbow Flexion and Extension AROM  - 2-3 x daily - 7 x weekly - 1 sets - 5 reps - Seated Cervical Retraction  - 2 x daily - 7 x weekly - 1-2 sets - 5-10 reps - 10 sec  hold - Flexion-Extension Shoulder Pendulum with Table Support  - 3-4 x daily - 7 x weekly - 1 sets - 20-30 reps - Horizontal Shoulder Pendulum with Table Support  - 3-4 x daily - 7 x weekly - 1 sets - 20-30 reps - Circular Shoulder Pendulum with Table Support  - 3-4 x daily - 7 x weekly - 1 sets - 20-30 reps  ASSESSMENT:  CLINICAL IMPRESSION: 12/02/24: patient returns with reports of significant improvement in R shoulder pain with being in the sling. Discussed taking sling off with support on pillow/arm of chair. He is working on exercises at home without difficulty. Added pendulum and chin tuck for HEP. Added PROM patient sitting - shoulder flexion; scaption; ER at side within protocol guidelines - no tissue pull or pain. Patient will ice at home. Encouraged continued use of ice on a daily basis.   Eval: Patient is a 68 y.o. male who was seen today for physical therapy evaluation and treatment s/p R TSA 11/24/24 following a fall with injury 07/27/24. He has been out of the sling since ~ the second post op day per MD instructions. Discussed supporting R UE when out of the sling to allow musculature of R shoulder girdle to rest and heal without need to support the weight of the UE. Patient presents with rounded posture and alignent through the R shoulder girdle and thoracic spine; limited ROM, strength, function of R  UE following reverse TSA. Of concern, he continues to report pain at 7-8/10 on a daily basis. Patient has decreased functional activities and ADL's. Khi will benefit from PT to address deficits and progress with shoulder rehab.   OBJECTIVE IMPAIRMENTS: decreased activity tolerance, decreased mobility, decreased ROM, decreased strength, increased muscle spasms, impaired UE functional use, improper body mechanics, postural dysfunction, and pain.   GOALS: Goals reviewed with patient? Yes  SHORT TERM GOALS: Target date: 01/11/2025   Independent in initial HEP  Baseline: Goal status: INITIAL  2.  Improve posture and alignment through shoulder girdle with patient demonstrating activation of posterior shoulder girdle musculature to improve shoulder function  Baseline:  Goal status: INITIAL  3.  P/AAROM R shoulder to 130 deg elevation and 30 deg ER in scapular plane Baseline:  Goal status: INITIAL   LONG TERM GOALS: Target date: 02/22/2025   Progress shoulder rehab per protocol or as directed by MD  Baseline:  Goal status: INITIAL  2.  PROM 145 deg elevation and 40 deg ER in scapular plane; functional IR to L! Baseline:  Goal status: INITIAL  3.  Decrease pain to 0/10 to no more than 3/10  Baseline:  Goal status: INITIAL  4.  Return to functional activities using R UE including return to work at desk/computer/phone  Baseline:  Goal status: INITIAL  5.  Improve Quick DASH score by 10% indicating improved functional activities  Baseline:  Goal status: INITIAL  6.  Independent in advanced HEP  Baseline:  Goal status: INITIAL  PLAN:  PT FREQUENCY: 2x/week  PT DURATION: 12 weeks  PLANNED INTERVENTIONS: 97164- PT Re-evaluation, 97110-Therapeutic exercises, 97530- Therapeutic activity, 97112- Neuromuscular re-education, 97535- Self Care, [id number]- Manual therapy, [phone]- Aquatic Therapy, Patient/Family education, and Taping  PLAN FOR NEXT SESSION: review and progress exercise  per protocol; manual work and modalities as indicated; progress rehab per protocol and/or as ordered note to MD at next visit appt 12/09/24   Shaneisha Burkel P Ina, PT 12/02/2024, 2:08 PM

## 2024-12-08 ENCOUNTER — Telehealth: Payer: Self-pay | Admitting: *Deleted

## 2024-12-08 ENCOUNTER — Ambulatory Visit: Admitting: Rehabilitative and Restorative Service Providers"

## 2024-12-08 ENCOUNTER — Encounter: Payer: Self-pay | Admitting: Rehabilitative and Restorative Service Providers"

## 2024-12-08 DIAGNOSIS — M25511 Pain in right shoulder: Secondary | ICD-10-CM

## 2024-12-08 DIAGNOSIS — M6281 Muscle weakness (generalized): Secondary | ICD-10-CM

## 2024-12-08 DIAGNOSIS — R29898 Other symptoms and signs involving the musculoskeletal system: Secondary | ICD-10-CM

## 2024-12-08 NOTE — Therapy (Signed)
 OUTPATIENT PHYSICAL THERAPY SHOULDER TREATMENT   Patient Name: Leon Nelson MRN: [id number] DOB:01-24-1956, 68 y.o., male Today's Date: 12/08/2024  END OF SESSION:  PT End of Session - 12/08/24 1136     Visit Number 3    Number of Visits 24    Date for Recertification  02/22/25    Authorization Type [employer] medicare auth required $20 copay    Authorization Time Period 11/30/24 - 02/22/25    Authorization - Visit Number 3    Authorization - Number of Visits 24    Progress Note Due on Visit 10    PT Start Time 1136    PT Stop Time 1221    PT Time Calculation (min) 45 min    Activity Tolerance Patient tolerated treatment well          Past Medical History:  Diagnosis Date   ABDOMINAL PAIN OTHER SPECIFIED SITE 07/20/2008   Arthritis Currently   CHEST PAIN 02/06/2011   COLONIC POLYPS, HX OF 05/26/2008   DEPRESSION 05/26/2008   DIABETES MELLITUS, TYPE II 05/26/2008   HYPERLIPIDEMIA 05/26/2008   HYPERTENSION 05/26/2008   HYPOTHYROIDISM 05/26/2008   KNEE PAIN, RIGHT, ACUTE 07/01/2008   Personal history of urinary calculi 05/26/2008   Pure hypercholesterolemia 05/19/2024   UTI'S, HX OF 05/26/2008   Past Surgical History:  Procedure Laterality Date   ANKLE SURGERY Left    JOINT REPLACEMENT  09/2009   Right Knee   REVERSE SHOULDER ARTHROPLASTY Right 11/24/2024   Procedure: ARTHROPLASTY, SHOULDER, TOTAL, REVERSE;  Surgeon: Genelle Standing, MD;  Location: [facility];  Service: Orthopedics;  Laterality: Right;   s/p right knee arthroplasty      complicated by patellar tendon rupture Jan 2011 Dr. heide   TONSILLECTOMY     Patient Active Problem List   Diagnosis Date Noted   Traumatic complete tear of right rotator cuff 11/24/2024   Fatty liver 07/29/2024   Joint pain 05/19/2024   Low back pain 05/19/2024   Thrombosed external hemorrhoids 05/19/2024   Work stress 04/09/2024   Coronary artery disease due to lipid rich plaque 04/09/2024   DOE (dyspnea on  exertion) 04/07/2024   Ascending aortic aneurysm 03/17/2024   Neuropathy 02/02/2024   Changes in vision 09/14/2023   Urinary frequency 01/23/2023   Constipation 01/23/2023   RUQ pain 04/24/2021   Vitamin D  deficiency 04/24/2021   Balance disorder 06/22/2020   Dizziness 06/22/2020   Irritable bowel syndrome, unspecified 01/15/2019   Rhabdomyolysis 01/08/2019   Left cervical radiculopathy 01/27/2018   Left hand paresthesia 01/27/2018   Hypokalemia 03/27/2017   Heart murmur previously undiagnosed 08/14/2011   Diabetes mellitus type 2 with neurological manifestations (HCC) 05/26/2008   Essential hypertension 05/26/2008   History of colonic polyps 05/26/2008    PCP: Dr [name]  REFERRING PROVIDER: Dr [name]  REFERRING DIAG: R Reverse TSA  THERAPY DIAG:  Acute pain of right shoulder  Other symptoms and signs involving the musculoskeletal system  Muscle weakness (generalized)  Rationale for Evaluation and Treatment: Rehabilitation  ONSET DATE: 11/24/24  SUBJECTIVE:  SUBJECTIVE STATEMENT: Doing much better until yesterday morning - awoke with pain but it improved through the day as he did his exercises. Awoke again this morning with pain which has persisted. Still sleeping in the chair with R UE in sling. Working on exercises at home. May be sleeping with the arm in a strain while in the recliner. Shoulder pain dissipates with exercise in clinic today. Has been in the sling except when bathing and dressing. Having a lot less pain in the R shoulder in general but flared up the last two mornings. Working on his exercise at home. Still has numbness and tingling in ring and little fingers of R hand but less noticeable than last week and is no longer constant.   EVAL: Patient reports that he had a  total tear of the rotator cuff in his R shoulder. Patient reports that he fell 07/27/24, landing body weight on the R shoulder. He had pain and was unable to lift R UE or use arm. Diagnostic tests revealed torn rotator cuff. Patient underwent reverse total shoulder replacement 11/24/24. Post op course has been uncomplicated. He has continued pain at 7-8/10 level. He is sleeping in the recliner.    Hand dominance: Right  PERTINENT HISTORY: Type II diabetes; hyperlipidemia; HTN; hypothyroidism: R knee pain; abdominal pain; depression; R TKA; ankle stabilization surgery 2020  PAIN:  Are you having pain? Yes: NPRS scale: 6/10  decreased to pain free with exercise in clinic  Pain location: R shoulder Pain description: solid pain; constant; intensity worse with movement  Aggravating factors: movement  Relieving factors: advil ; tylonol;   PRECAUTIONS: Shoulder R reverse TSA per protocol    WEIGHT BEARING RESTRICTIONS: Yes   FALLS:  Has patient fallen in last 6 months? Yes. 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  DIAGNOSTIC FINDINGS:  CT R shoulder 09/30/24: 1. Moderate glenohumeral joint osteoarthritis. 2. Severe acromioclavicular joint osteoarthritis. 3. Mild atrophy of the subscapularis and infraspinatus muscles.  PATIENT SURVEYS: (survey based on patient's subjective report of symptoms and activity level)  Quick DASH - 88.6/100; 88.6%       SENSATION: WFL  POSTURE: Head forward; shoulders rounded and  elevated; increased thoracic kyphosis  UPPER EXTREMITY ROM: R hand, wrist, forearm, elbow WFL's    R shoulder ROM not assessed today    L UE WFL's   Passive ROM Right 12/02/24 Right  12/08/24  Shoulder flexion 75 deg 95  Shoulder extension    Shoulder abduction (scaption) 40 80  Shoulder adduction    Shoulder internal rotation    Shoulder external rotation 20  30  Elbow flexion    Elbow extension    Wrist flexion    Wrist extension    Wrist ulnar deviation    Wrist radial deviation    Wrist pronation    Wrist supination    (Blank rows = not tested)  UPPER EXTREMITY MMT: not assessed   MMT Right eval Left eval  Shoulder flexion    Shoulder extension  Shoulder abduction    Shoulder adduction    Shoulder internal rotation    Shoulder external rotation    Middle trapezius    Lower trapezius    Elbow flexion    Elbow extension    Wrist flexion    Wrist extension    Wrist ulnar deviation    Wrist radial deviation    Wrist pronation    Wrist supination    Grip strength (lbs)    (Blank rows = not tested)  PALPATION:   Muscular tightness upper trap   [facility] Adult PT Treatment:                                                DATE: 12/08/24 Therapeutic Exercise: AROM R hand, wrist, forearm, elbow Pendulum flexion; horizontal ab/adduction; circles CW/CCW x 15 each  Manual Therapy: (patient sitting) STM R upper trap; forearm area  PROM R shoulder elevation; ER in scapular plane  Neuromuscular re-ed: Chin tuck 5 sec x 10  Scap squeeze 5 sec x 10  Therapeutic Activity: Shoulder flexion step back 5 sec x 5  Table slide shoulder flexion 5 sec x 1 ER with cane elbow supported 5 sec x 5  Modalities: To ice at home  Self Care: Adjusted sling for sleep position Continue with sling for comfort and UE support - weaning Can remove sling with R UE supported at side on pillow    [facility] Adult PT Treatment:                                                DATE:  12/02/24 Therapeutic Exercise: AROM R hand, wrist, forearm, elbow Pendulum flexion; horizontal ab/adduction; circles CW/CCW x 15 each  Manual Therapy: (patient sitting) STM R upper trap; forearm area  PROM R shoulder flexion; scaption; ER in scapular plane  Neuromuscular re-ed: Chin tuck 5 sec x 10  Scap squeeze 5 sec x 10  Therapeutic Activity:  Modalities: To ice at home  Self Care: Continue with sling for comfort and UE support Can remove sling with R UE supported at side on pillow                                                                                                                              TREATMENT DATE: evaluation findings; POC; initial HEP; precautions    PATIENT EDUCATION: Education details: POC;HEP  Person educated: Patient Education method: Explanation, Demonstration, Tactile cues, Verbal cues, and Handouts Education comprehension: verbalized understanding, returned demonstration, verbal cues required, tactile cues required, and needs further education  HOME EXERCISE PROGRAM: Access Code: [email or web address] URL: [email or web address] Date: 12/08/2024 Prepared by: Kenzel Ruesch  Exercises - Seated Scapular Retraction  -  2 x daily - 7 x weekly - 1-2 sets - 10 reps - 10 sec  hold - Hand Active Tabletop Fist Series  - 2-3 x daily - 7 x weekly - 1 sets - 5-10 reps - Wrist AROM Flexion Extension  - 2-3 x daily - 7 x weekly - 1 sets - 5-10 reps - Wrist Circumduction AROM  - 2-3 x daily - 7 x weekly - 1 sets - 10 reps - Seated Forearm Pronation and Supination AROM  - 2-3 x daily - 7 x weekly - 1 sets - 10 reps - Wrist AROM Radial Ulnar Deviation  - 2-3 x daily - 7 x weekly - 1 sets - 10 reps - Seated Elbow Flexion and Extension AROM  - 2-3 x daily - 7 x weekly - 1 sets - 5 reps - Seated Cervical Retraction  - 2 x daily - 7 x weekly - 1-2 sets - 5-10 reps - 10 sec  hold - Flexion-Extension Shoulder Pendulum with Table Support  - 3-4 x daily - 7 x weekly - 1  sets - 20-30 reps - Horizontal Shoulder Pendulum with Table Support  - 3-4 x daily - 7 x weekly - 1 sets - 20-30 reps - Circular Shoulder Pendulum with Table Support  - 3-4 x daily - 7 x weekly - 1 sets - 20-30 reps - Standing 'L' Stretch at Counter  - 2 x daily - 7 x weekly - 1 sets - 5-10 reps - 5 sec  hold - Seated Shoulder Flexion Towel Slide at Table Top Full Range of Motion  - 2 x daily - 7 x weekly - 1 sets - 5-10 reps - 10sec  hold - Seated Shoulder External Rotation AAROM with Dowel  - 2 x daily - 7 x weekly - 1 sets - 5-10 reps - 5 sec  hold  ASSESSMENT:  CLINICAL IMPRESSION: 12/08/24: patient returns with reports of significant improvement in R shoulder pain with being in the sling more but he has awoke with pain the past two mornings. Pain subsided with exercises yesterday but persists today. Worked on scapular position and ROM exercises as well as manual work and PROM by PT in treatment session today with full resolution of pain. Adjusted sling for improved positioning for UE. Discussed weaning from sling, with UE supported on pillow/arm of chair. He is working on exercises at home without difficulty. Added tabe slide, shoulder flexion step back; ER with cane UE in neutral for HEP. Patient was pain free a conclusion of treatment. Progressing well with rehab.  Patient will ice at home. Encouraged continued use of ice on a daily basis.   Eval: Patient is a 68 y.o. male who was seen today for physical therapy evaluation and treatment s/p R TSA 11/24/24 following a fall with injury 07/27/24. He has been out of the sling since ~ the second post op day per MD instructions. Discussed supporting R UE when out of the sling to allow musculature of R shoulder girdle to rest and heal without need to support the weight of the UE. Patient presents with rounded posture and alignent through the R shoulder girdle and thoracic spine; limited ROM, strength, function of R UE following reverse TSA. Of concern, he  continues to report pain at 7-8/10 on a daily basis. Patient has decreased functional activities and ADL's. Leon Nelson will benefit from PT to address deficits and progress with shoulder rehab.   OBJECTIVE IMPAIRMENTS: decreased activity tolerance, decreased mobility, decreased ROM,  decreased strength, increased muscle spasms, impaired UE functional use, improper body mechanics, postural dysfunction, and pain.   GOALS: Goals reviewed with patient? Yes  SHORT TERM GOALS: Target date: 01/11/2025   Independent in initial HEP  Baseline: Goal status: INITIAL  2.  Improve posture and alignment through shoulder girdle with patient demonstrating activation of posterior shoulder girdle musculature to improve shoulder function  Baseline:  Goal status: INITIAL  3.  P/AAROM R shoulder to 130 deg elevation and 30 deg ER in scapular plane Baseline:  Goal status: INITIAL   LONG TERM GOALS: Target date: 02/22/2025   Progress shoulder rehab per protocol or as directed by MD  Baseline:  Goal status: INITIAL  2.  PROM 145 deg elevation and 40 deg ER in scapular plane; functional IR to L! Baseline:  Goal status: INITIAL  3.  Decrease pain to 0/10 to no more than 3/10  Baseline:  Goal status: INITIAL  4.  Return to functional activities using R UE including return to work at desk/computer/phone  Baseline:  Goal status: INITIAL  5.  Improve Quick DASH score by 10% indicating improved functional activities  Baseline:  Goal status: INITIAL  6.  Independent in advanced HEP  Baseline:  Goal status: INITIAL  PLAN:  PT FREQUENCY: 2x/week  PT DURATION: 12 weeks  PLANNED INTERVENTIONS: 97164- PT Re-evaluation, 97110-Therapeutic exercises, 97530- Therapeutic activity, 97112- Neuromuscular re-education, 97535- Self Care, [id number]- Manual therapy, [phone]- Aquatic Therapy, Patient/Family education, and Taping  PLAN FOR NEXT SESSION: review and progress exercise per protocol; manual work and modalities  as indicated; progress rehab per protocol and/or as ordered  - note to MD at next visit appt 12/09/24   Kayhan Boardley P Latressa Harries, PT 12/08/2024, 11:38 AM

## 2024-12-08 NOTE — Telephone Encounter (Signed)
 [facility] RNCM was provided with patient's name today (1 day prior to surgery) and called to discuss his upcoming Right reverse shoulder arthroplasty with Dr. Genelle at [facility] on 11/24/24. Patient is agreeable to case management. His plan is to return home with assistance from his spouse. No DME needed prior to surgery. Will be provided sling and ice machine at [facility]. OPPT already scheduled for 11/30/24 at [facility] location. Reviewed post op care instructions. Will continue to follow for needs.SANE score=50%

## 2024-12-08 NOTE — Telephone Encounter (Signed)
 Late entry for 12/03/24- 7 day post op call completed.

## 2024-12-09 ENCOUNTER — Ambulatory Visit (HOSPITAL_BASED_OUTPATIENT_CLINIC_OR_DEPARTMENT_OTHER): Payer: Self-pay | Admitting: Orthopaedic Surgery

## 2024-12-09 ENCOUNTER — Ambulatory Visit (HOSPITAL_BASED_OUTPATIENT_CLINIC_OR_DEPARTMENT_OTHER)

## 2024-12-09 ENCOUNTER — Ambulatory Visit (INDEPENDENT_AMBULATORY_CARE_PROVIDER_SITE_OTHER): Admitting: Orthopaedic Surgery

## 2024-12-09 DIAGNOSIS — S46011A Strain of muscle(s) and tendon(s) of the rotator cuff of right shoulder, initial encounter: Secondary | ICD-10-CM

## 2024-12-09 NOTE — Progress Notes (Signed)
 Post Operative Evaluation    Procedure/Date of Surgery: Right shoulder reverse shoulder arthroplasty 12/2  Interval History:   Presents today for follow-up 2 weeks status post above procedure.  Overall he is doing extremely well.  Numbness has worn off completely.  He has begun physical therapy   PMH/PSH/Family History/Social History/Meds/Allergies:    Past Medical History:  Diagnosis Date   ABDOMINAL PAIN OTHER SPECIFIED SITE 07/20/2008   Arthritis Currently   CHEST PAIN 02/06/2011   COLONIC POLYPS, HX OF 05/26/2008   DEPRESSION 05/26/2008   DIABETES MELLITUS, TYPE II 05/26/2008   HYPERLIPIDEMIA 05/26/2008   HYPERTENSION 05/26/2008   HYPOTHYROIDISM 05/26/2008   KNEE PAIN, RIGHT, ACUTE 07/01/2008   Personal history of urinary calculi 05/26/2008   Pure hypercholesterolemia 05/19/2024   UTI'S, HX OF 05/26/2008   Past Surgical History:  Procedure Laterality Date   ANKLE SURGERY Left    JOINT REPLACEMENT  09/2009   Right Knee   REVERSE SHOULDER ARTHROPLASTY Right 11/24/2024   Procedure: ARTHROPLASTY, SHOULDER, TOTAL, REVERSE;  Surgeon: Genelle Standing, MD;  Location: [facility];  Service: Orthopedics;  Laterality: Right;   s/p right knee arthroplasty      complicated by patellar tendon rupture Jan 2011 Dr. heide   TONSILLECTOMY     Social History   Socioeconomic History   Marital status: Married    Spouse name: Not on file   Number of children: Not on file   Years of education: Not on file   Highest education level: Some college, no degree  Occupational History   Not on file  Tobacco Use   Smoking status: Never   Smokeless tobacco: Never  Vaping Use   Vaping status: Never Used  Substance and Sexual Activity   Alcohol use: No   Drug use: No   Sexual activity: Yes  Other Topics Concern   Not on file  Social History Narrative   married   Social Drivers of Health   Tobacco Use: Low Risk (12/08/2024)    Patient History    Smoking Tobacco Use: Never    Smokeless Tobacco Use: Never    Passive Exposure: Not on file  Financial Resource Strain: Low Risk (07/25/2024)   Overall Financial Resource Strain (CARDIA)    Difficulty of Paying Living Expenses: Not hard at all  Food Insecurity: No Food Insecurity (07/25/2024)   Epic    Worried About Radiation Protection Practitioner of Food in the Last Year: Never true    Ran Out of Food in the Last Year: Never true  Transportation Needs: No Transportation Needs (07/25/2024)   Epic    Lack of Transportation (Medical): No    Lack of Transportation (Non-Medical): No  Physical Activity: Inactive (07/25/2024)   Exercise Vital Sign    Days of Exercise per Week: 0 days    Minutes of Exercise per Session: Not on file  Stress: No Stress Concern Present (07/25/2024)   [employer] of Occupational Health - Occupational Stress Questionnaire    Feeling of Stress: Not at all  Social Connections: Socially Integrated (07/25/2024)   Social Connection and Isolation Panel    Frequency of Communication with Friends and Family: More than three times a week    Frequency of Social Gatherings with Friends and Family: Once a week    Attends Religious Services: More than 4  times per year    Active Member of Clubs or Organizations: Yes    Attends Banker Meetings: More than 4 times per year    Marital Status: Married  Depression (PHQ2-9): Low Risk (09/23/2024)   Depression (PHQ2-9)    PHQ-2 Score: 0  Alcohol Screen: Low Risk (05/26/2024)   Alcohol Screen    Last Alcohol Screening Score (AUDIT): 0  Housing: Low Risk (07/25/2024)   Epic    Unable to Pay for Housing in the Last Year: No    Number of Times Moved in the Last Year: 0    Homeless in the Last Year: No  Utilities: Not At Risk (05/26/2024)   AHC Utilities    Threatened with loss of utilities: No  Health Literacy: Adequate Health Literacy (05/26/2024)   B1300 Health Literacy    Frequency of need for help with medical  instructions: Never   Family History  Problem Relation Age of Onset   Cancer Mother        breast cancer   Hyperlipidemia Mother    Heart disease Mother    Hyperlipidemia Father    Heart disease Father    Alcohol abuse Other    Diabetes Other    Allergies[1] Current Outpatient Medications  Medication Sig Dispense Refill   allopurinol  (ZYLOPRIM ) 300 MG tablet TAKE 1 TABLET BY MOUTH DAILY 100 tablet 2   atenolol -chlorthalidone  (TENORETIC ) 50-25 MG tablet TAKE ONE-HALF TABLET BY MOUTH  DAILY 50 tablet 2   Lancets (ONETOUCH DELICA PLUS LANCET30G) MISC USE AS DIRECTED ONCE DAILY 100 each 2   lisinopril  (ZESTRIL ) 20 MG tablet TAKE 1 TABLET BY MOUTH DAILY 100 tablet 2   metFORMIN  (GLUCOPHAGE -XR) 500 MG 24 hr tablet TAKE 2 TABLETS BY MOUTH TWICE  DAILY WITH MEALS 400 tablet 2   rosuvastatin  (CRESTOR ) 40 MG tablet TAKE 1 TABLET BY MOUTH DAILY 100 tablet 2   venlafaxine  XR (EFFEXOR -XR) 75 MG 24 hr capsule TAKE 1 CAPSULE BY MOUTH DAILY 100 capsule 2   albuterol  (VENTOLIN  HFA) 108 (90 Base) MCG/ACT inhaler Inhale 2 puffs into the lungs every 6 (six) hours as needed for wheezing or shortness of breath. 8 g 11   amitriptyline  (ELAVIL ) 50 MG tablet 1 - 2 tabs by mouth at bedtime for nerve pain 60 tablet 5   aspirin  EC 325 MG tablet Take 1 tablet (325 mg total) by mouth daily. (Patient not taking: Reported on 11/12/2024) 14 tablet 0   aspirin  EC 81 MG tablet Take 1 tablet (81 mg total) by mouth daily. Swallow whole.     Blood Glucose Calibration (ACCU-CHEK GUIDE CONTROL) LIQD Use as directed once daily as needed E11.9 1 each 1   Blood Glucose Monitoring Suppl (ACCU-CHEK GUIDE ME) w/Device KIT Use as directed up to four times per day E11.9 1 kit 0   Continuous Glucose Receiver (FREESTYLE LIBRE 3 READER) DEVI Use as directed four times per day E11.9 1 each 0   Continuous Glucose Sensor (FREESTYLE LIBRE 3 PLUS SENSOR) MISC Change sensor every 15 days. E11.9 6 each 3   dapagliflozin  propanediol (FARXIGA )  10 MG TABS tablet Take 1 tablet (10 mg total) by mouth daily before breakfast. 90 tablet 3   diclofenac  sodium (VOLTAREN ) 1 % GEL Apply 2 g topically 4 (four) times daily as needed. 200 g 5   dicyclomine  (BENTYL ) 20 MG tablet TAKE 1 TABLET BY MOUTH 3 TIMES  DAILY AS NEEDED 300 tablet 3   furosemide  (LASIX ) 40 MG tablet Take 1  tablet (40 mg total) by mouth daily. 30 tablet 0   gabapentin  (NEURONTIN ) 300 MG capsule TAKE 1 CAPSULE BY MOUTH TWICE  DAILY 200 capsule 1   glipiZIDE  (GLUCOTROL  XL) 10 MG 24 hr tablet TAKE 1 TABLET BY MOUTH DAILY  WITH BREAKFAST 100 tablet 3   ketoconazole (NIZORAL) 2 % shampoo Apply 1 Application topically 3 (three) times a week.     levothyroxine  (SYNTHROID ) 50 MCG tablet TAKE 1 TABLET BY MOUTH DAILY 100 tablet 3   oxyCODONE  (ROXICODONE ) 5 MG immediate release tablet Take 1 tablet (5 mg total) by mouth every 4 (four) hours as needed for severe pain (pain score 7-10) or breakthrough pain. (Patient not taking: Reported on 11/12/2024) 30 tablet 0   potassium chloride  SA (KLOR-CON  M) 20 MEQ tablet TAKE 2 TABLETS BY MOUTH DAILY 200 tablet 2   Semaglutide , 1 MG/DOSE, 4 MG/3ML SOPN Inject 1 mg as directed once a week. 9 mL 3   solifenacin  (VESICARE ) 5 MG tablet Take 1 tablet by mouth once daily 90 tablet 0   No current facility-administered medications for this visit.   No results found.  Review of Systems:   A ROS was performed including pertinent positives and negatives as documented in the HPI.   Musculoskeletal Exam:    There were no vitals taken for this visit.  Right shoulder incisions well-appearing without erythema or drainage.  In the supine position he can forward elevate passively and passive assisted to 90 degrees flexion external rotation at the side is to 30 degrees  Imaging:    3 views right shoulder: Status post reverse shoulder arthroplasty without evidence of complication  I personally reviewed and interpreted the radiographs.   Assessment:    Presents 2 weeks status post right reverse shoulder arthroplasty overall doing extremely well.  This time he will continue to work on active and active assisted range of motion return to clinic 4 weeks  Plan :    - Return to clinic 4 weeks for reassessment      I personally saw and evaluated the patient, and participated in the management and treatment plan.  Elspeth Parker, MD Attending Physician, Orthopedic Surgery  This document was dictated using Dragon voice recognition software. A reasonable attempt at proof reading has been made to minimize errors.    [1]  Allergies Allergen Reactions   Penicillins Other (See Comments)    Blisters on hands   Sulfonamide Derivatives Other (See Comments)    Blisters in groin area

## 2024-12-14 ENCOUNTER — Ambulatory Visit: Admitting: Rehabilitative and Restorative Service Providers"

## 2024-12-14 ENCOUNTER — Encounter: Payer: Self-pay | Admitting: Rehabilitative and Restorative Service Providers"

## 2024-12-14 DIAGNOSIS — M6281 Muscle weakness (generalized): Secondary | ICD-10-CM

## 2024-12-14 DIAGNOSIS — M25511 Pain in right shoulder: Secondary | ICD-10-CM

## 2024-12-14 DIAGNOSIS — R29898 Other symptoms and signs involving the musculoskeletal system: Secondary | ICD-10-CM

## 2024-12-14 NOTE — Therapy (Signed)
 " OUTPATIENT PHYSICAL THERAPY SHOULDER TREATMENT   Patient Name: Leon Nelson MRN: [id number] DOB:1956-06-29, 68 y.o., male Today's Date: 12/14/2024  END OF SESSION:  PT End of Session - 12/14/24 1402     Visit Number 4    Number of Visits 24    Date for Recertification  02/22/25    Authorization Type [employer] medicare auth required $20 copay    Authorization Time Period 11/30/24 - 02/22/25    Authorization - Visit Number 4    Authorization - Number of Visits 24    Progress Note Due on Visit 10    PT Start Time 1400    PT Stop Time 1445    PT Time Calculation (min) 45 min    Activity Tolerance Patient tolerated treatment well          Past Medical History:  Diagnosis Date   ABDOMINAL PAIN OTHER SPECIFIED SITE 07/20/2008   Arthritis Currently   CHEST PAIN 02/06/2011   COLONIC POLYPS, HX OF 05/26/2008   DEPRESSION 05/26/2008   DIABETES MELLITUS, TYPE II 05/26/2008   HYPERLIPIDEMIA 05/26/2008   HYPERTENSION 05/26/2008   HYPOTHYROIDISM 05/26/2008   KNEE PAIN, RIGHT, ACUTE 07/01/2008   Personal history of urinary calculi 05/26/2008   Pure hypercholesterolemia 05/19/2024   UTI'S, HX OF 05/26/2008   Past Surgical History:  Procedure Laterality Date   ANKLE SURGERY Left    JOINT REPLACEMENT  09/2009   Right Knee   REVERSE SHOULDER ARTHROPLASTY Right 11/24/2024   Procedure: ARTHROPLASTY, SHOULDER, TOTAL, REVERSE;  Surgeon: Leon Standing, MD;  [facility];  Service: Orthopedics;  Laterality: Right;   s/p right knee arthroplasty      complicated by patellar tendon rupture Jan 2011 Leon. heide   TONSILLECTOMY     Patient Active Problem List   Diagnosis Date Noted   Traumatic complete tear of right rotator cuff 11/24/2024   Fatty liver 07/29/2024   Joint pain 05/19/2024   Low back pain 05/19/2024   Thrombosed external hemorrhoids 05/19/2024   Work stress 04/09/2024   Coronary artery disease due to lipid rich plaque 04/09/2024   DOE (dyspnea on  exertion) 04/07/2024   Ascending aortic aneurysm 03/17/2024   Neuropathy 02/02/2024   Changes in vision 09/14/2023   Urinary frequency 01/23/2023   Constipation 01/23/2023   RUQ pain 04/24/2021   Vitamin D  deficiency 04/24/2021   Balance disorder 06/22/2020   Dizziness 06/22/2020   Irritable bowel syndrome, unspecified 01/15/2019   Rhabdomyolysis 01/08/2019   Left cervical radiculopathy 01/27/2018   Left hand paresthesia 01/27/2018   Hypokalemia 03/27/2017   Heart murmur previously undiagnosed 08/14/2011   Diabetes mellitus type 2 with neurological manifestations (HCC) 05/26/2008   Essential hypertension 05/26/2008   History of colonic polyps 05/26/2008    PCP: Leon Leon Nelson  REFERRING PROVIDER: Dr Nelson Leon  REFERRING DIAG: R Reverse TSA  THERAPY DIAG:  Acute pain of right shoulder  Other symptoms and signs involving the musculoskeletal system  Muscle weakness (generalized)  Rationale for Evaluation and Treatment: Rehabilitation  ONSET DATE: 11/24/24  SUBJECTIVE:  SUBJECTIVE STATEMENT: Patient reports that Leon Leon was pleased with progress post shoulder rehab. He does not have to wear the sling except at night. Patient has continued to use sling at home. Discussed weaning from the sling during the day. He has slept in the bed for the past 4 nights - takes a while to get to sleep but has slept well. Has felt a pop and sharp pain sometimes since Friday. Notices with exercises sometimes. With questioning, patient states the pain is with AAROM in shoulder flexion when he is sitting in his recliner.   EVAL: Patient reports that he had a total tear of the rotator cuff in his R shoulder. Patient reports that he fell 07/27/24, landing body weight on the R shoulder. He had pain and was unable to  lift R UE or use arm. Diagnostic tests revealed torn rotator cuff. Patient underwent reverse total shoulder replacement 11/24/24. Post op course has been uncomplicated. He has continued pain at 7-8/10 level. He is sleeping in the recliner. Pain free R shoulder at conclusion of treatment.   Hand dominance: Right  PERTINENT HISTORY: Type II diabetes; hyperlipidemia; HTN; hypothyroidism: R knee pain; abdominal pain; depression; R TKA; ankle stabilization surgery 2020  PAIN:  Are you having pain? Yes: NPRS scale: 5-6/10  decreased to pain free with exercise in clinic  Pain location: R shoulder Pain description: solid pain; constant; intensity worse with movement  Aggravating factors: movement  Relieving factors: advil ; tylonol;   PRECAUTIONS: Shoulder R reverse TSA per protocol    WEIGHT BEARING RESTRICTIONS: Yes   FALLS:  Has patient fallen in last 6 months? Yes. 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  DIAGNOSTIC FINDINGS:  CT R shoulder 09/30/24: 1. Moderate glenohumeral joint osteoarthritis. 2. Severe acromioclavicular joint osteoarthritis. 3. Mild atrophy of the subscapularis and infraspinatus muscles.  PATIENT SURVEYS: (survey based on patient's subjective report of symptoms and activity level)  Quick DASH - 88.6/100; 88.6%       SENSATION: WFL  POSTURE: Head forward; shoulders rounded and elevated; increased thoracic kyphosis  UPPER EXTREMITY ROM: R hand, wrist, forearm, elbow WFL's    R  shoulder ROM not assessed today    L UE WFL's     12/14/24: PROM R shoulder previously assessed in sitting  Passive ROM Right 12/02/24 Right  12/08/24  Right  12/14/24 Supine   Shoulder flexion 75 deg 95 141  Shoulder extension     Shoulder abduction (scaption) 40 80 80  Shoulder adduction     Shoulder internal rotation     Shoulder external rotation  (in scapular plane)  20  30 32  Elbow flexion     Elbow extension     Wrist flexion     Wrist extension     Wrist ulnar deviation     Wrist radial deviation     Wrist pronation     Wrist supination     (Blank rows = not tested)  UPPER EXTREMITY MMT: not assessed   MMT Right  eval Left eval  Shoulder flexion    Shoulder extension    Shoulder abduction    Shoulder adduction    Shoulder internal rotation    Shoulder external rotation    Middle trapezius    Lower trapezius    Elbow flexion    Elbow extension    Wrist flexion    Wrist extension    Wrist ulnar deviation    Wrist radial deviation    Wrist pronation    Wrist supination    Grip strength (lbs)    (Blank rows = not tested)  PALPATION:   Muscular tightness upper trap   [facility] Adult PT Treatment:                                                DATE: 12/14/24 Therapeutic Exercise: Pendulum flexion; horizontal ab/adduction; circles CW/CCW x 15 each  Manual Therapy: (patient supine) STM R upper trap; forearm area  PROM R shoulder flexion; scaption; ER in scapular plane  Scar massage  Neuromuscular re-ed: Chin tuck 5 sec x 10  Scap squeeze 5 sec x 10  Isometric R shoulder flexion; abduction; extension without resistance to activate all heads of the deltoid sitting scap squeeze 2 sec x 10 ea Therapeutic Activity: Shoulder flexion step back 5 sec x 5  Table slide shoulder flexion 5 sec x 3 (patient does in Nelson at counter at home to achieve proper height) Supine - assisting with L hand for AAROM R shoulder flexion keeping scapulae down and back (no  popping or pain reported) Modalities: To ice at home  Self Care: Instructed in sleeping position and exercises supine for home using bolster and pillows trial in clinic - took photo for patient on his phone Reviewed sling position  Continue with sling for comfort and UE support - discussed weaning fro sling at home  Can remove sling with R UE supported at side on pillow    [facility] Adult PT Treatment:                                                DATE: 12/08/24 Therapeutic Exercise: AROM R hand, wrist, forearm, elbow Pendulum flexion; horizontal ab/adduction; circles CW/CCW x 15 each  Manual Therapy: (patient sitting) STM R upper trap; forearm area  PROM R shoulder elevation; ER in scapular plane  Neuromuscular re-ed: Chin tuck 5 sec x 10  Scap squeeze 5 sec x 10  Therapeutic Activity: Shoulder flexion step back 5 sec x 5  Table slide shoulder flexion 5 sec x 1 ER with cane elbow supported 5 sec x 5  Modalities: To ice at home  Self Care: Adjusted sling for sleep position Continue with sling for comfort and UE support - weaning Can remove sling with R UE supported at side on pillow     PATIENT EDUCATION: Education details: POC;HEP  Person educated: Patient Education method: Explanation, Demonstration, Tactile cues, Verbal cues, and Handouts Education comprehension: verbalized understanding, returned demonstration, verbal cues required, tactile cues required, and needs further education  HOME EXERCISE PROGRAM: Access Code: [email or web address] URL: [email or web address] Date: 12/14/2024 Prepared by: Darrelle Barrell  Program Notes -sitting with chest up and shoulder down and back -pretend to push arm forward, out to side,  and backward without moving arm-3 seconds x 10 -2 x day   Exercises - Seated Scapular Retraction  - 2 x daily - 7 x weekly - 1-2 sets - 10 reps - 10 sec  hold - Seated Cervical Retraction  - 2 x daily - 7 x weekly - 1-2 sets - 5-10 reps - 10 sec  hold -  Flexion-Extension Shoulder Pendulum with Table Support  - 3-4 x daily - 7 x weekly - 1 sets - 20-30 reps - Horizontal Shoulder Pendulum with Table Support  - 3-4 x daily - 7 x weekly - 1 sets - 20-30 reps - Circular Shoulder Pendulum with Table Support  - 3-4 x daily - 7 x weekly - 1 sets - 20-30 reps - Nelson 'L' Stretch at Counter  - 2 x daily - 7 x weekly - 1 sets - 5-10 reps - 5 sec  hold - Seated Shoulder Flexion Towel Slide at Table Top Full Range of Motion  - 2 x daily - 7 x weekly - 1 sets - 5-10 reps - 10sec  hold - Seated Shoulder External Rotation AAROM with Dowel  - 2 x daily - 7 x weekly - 1 sets - 5-10 reps - 5 sec  hold - Supine Shoulder Flexion AAROM with Hands Clasped  - 2 x daily - 7 x weekly - 1 sets - 5-10 reps - 2-3 sec  hold  Patient Education - Scar Massage  ASSESSMENT:  CLINICAL IMPRESSION: 12/08/24: patient is 2 weeks 6 days post reverse TSA. He that he has experienced some popping and pain in the R shoulder with AAROM shoulder flexion sitting in chair. Discussed and trialed AAROM for shoulder flexion in supine with support of pillows with no popping or pain in R shoulder. Discussed and tried supine with support for sleeping position which patient reports felt was very comfortable. Continued to work on scapular position and ROM exercises as well as manual work and PROM by PT in treatment session today again with full resolution of pain. Added isometric activation for all heads of the deltoid in sitting as well as scar massage for home. Continued with post op education re-positioning and weaning from sling. Progressing well with rehab. Patient will ice at home. Encouraged continued use of ice on a daily basis.   Eval: Patient is a 68 y.o. male who was seen today for physical therapy evaluation and treatment s/p R TSA 11/24/24 following a fall with injury 07/27/24. He has been out of the sling since ~ the second post op day per MD instructions. Discussed supporting R UE when out  of the sling to allow musculature of R shoulder girdle to rest and heal without need to support the weight of the UE. Patient presents with rounded posture and alignent through the R shoulder girdle and thoracic spine; limited ROM, strength, function of R UE following reverse TSA. Of concern, he continues to report pain at 7-8/10 on a daily basis. Patient has decreased functional activities and ADL's. Anddy will benefit from PT to address deficits and progress with shoulder rehab.   OBJECTIVE IMPAIRMENTS: decreased activity tolerance, decreased mobility, decreased ROM, decreased strength, increased muscle spasms, impaired UE functional use, improper body mechanics, postural dysfunction, and pain.   GOALS: Goals reviewed with patient? Yes  SHORT TERM GOALS: Target date: 01/11/2025   Independent in initial HEP  Baseline: Goal status: INITIAL  2.  Improve posture and alignment through shoulder girdle with patient demonstrating activation of posterior shoulder girdle  musculature to improve shoulder function  Baseline:  Goal status: INITIAL  3.  P/AAROM R shoulder to 130 deg elevation and 30 deg ER in scapular plane Baseline:  Goal status: INITIAL   LONG TERM GOALS: Target date: 02/22/2025   Progress shoulder rehab per protocol or as directed by MD  Baseline:  Goal status: INITIAL  2.  PROM 145 deg elevation and 40 deg ER in scapular plane; functional IR to L! Baseline:  Goal status: INITIAL  3.  Decrease pain to 0/10 to no more than 3/10  Baseline:  Goal status: INITIAL  4.  Return to functional activities using R UE including return to work at desk/computer/phone  Baseline:  Goal status: INITIAL  5.  Improve Quick DASH score by 10% indicating improved functional activities  Baseline:  Goal status: INITIAL  6.  Independent in advanced HEP  Baseline:  Goal status: INITIAL  PLAN:  PT FREQUENCY: 2x/week  PT DURATION: 12 weeks  PLANNED INTERVENTIONS: 97164- PT  Re-evaluation, 97110-Therapeutic exercises, 97530- Therapeutic activity, 97112- Neuromuscular re-education, 97535- Self Care, [id number]- Manual therapy, [phone]- Aquatic Therapy, Patient/Family education, and Taping  PLAN FOR NEXT SESSION: review and progress exercise per protocol; manual work and modalities as indicated; progress rehab per protocol and/or as ordered  - trial of isometric Nelson for activation of deltoid per protocol; ER with cane for AAROM; continued with exercises per protocol    Tobi Groesbeck SHAUNNA Baptist, PT 12/14/2024, 2:03 PM  "

## 2024-12-15 ENCOUNTER — Ambulatory Visit: Admitting: Podiatry

## 2024-12-15 DIAGNOSIS — B351 Tinea unguium: Secondary | ICD-10-CM | POA: Diagnosis not present

## 2024-12-15 DIAGNOSIS — Q828 Other specified congenital malformations of skin: Secondary | ICD-10-CM | POA: Diagnosis not present

## 2024-12-15 DIAGNOSIS — M79675 Pain in left toe(s): Secondary | ICD-10-CM | POA: Diagnosis not present

## 2024-12-15 DIAGNOSIS — E1149 Type 2 diabetes mellitus with other diabetic neurological complication: Secondary | ICD-10-CM | POA: Diagnosis not present

## 2024-12-15 DIAGNOSIS — M79674 Pain in right toe(s): Secondary | ICD-10-CM

## 2024-12-15 NOTE — Progress Notes (Signed)
 Subjective: Chief Complaint  Patient presents with   Diabetes    Patient presents today for [facility] NIDDM last A1C- 7.3  Has been experiencing tingling and numbness    68 year old male presents the office today for diabetic foot evaluation and for calluses to both of his great toes as well as the toenails he cannot trim himself.  Does not report any ulcerations or other concerns today.  Last A1c was 7.3 on 07/29/2024 Norleen Lynwood ORN, MD Last seen September 23, 2024  Objective: AAO x3, NAD DP/PT pulses palpable bilaterally, CRT less than 3 seconds Sensation decreased with SWMF.  Nails are hypertrophic, dystrophic, brittle, discolored, elongated 10. No surrounding redness or drainage. Tenderness nails 1-5 bilaterally.   Hyperkeratotic lesions to bilateral hallux and left submetatarsal 2.  Upon debridement no underlying ulceration, drainage or any signs of infection.  Flatfoot present bilateral.  No other open lesions No pain with calf compression, swelling, warmth, erythema  Assessment: 68 year old male with symptomatic onychomycosis, hyperkeratotic lesions; neuropathy  Plan: -All treatment options discussed with the patient including all alternatives, risks, complications.  -Nails debrided x10 without any complications or bleeding -Hyperkeratotic lesion sharply treated x 3 without complications -Continue gabapentin  -Discussed daily foot inspection, glucose control  Return in about 3 months (around 03/15/2025).  Donnice JONELLE Fees DPM

## 2024-12-21 ENCOUNTER — Encounter (HOSPITAL_BASED_OUTPATIENT_CLINIC_OR_DEPARTMENT_OTHER): Payer: Self-pay | Admitting: Family

## 2024-12-21 ENCOUNTER — Ambulatory Visit (HOSPITAL_BASED_OUTPATIENT_CLINIC_OR_DEPARTMENT_OTHER): Admitting: Family

## 2024-12-21 ENCOUNTER — Other Ambulatory Visit

## 2024-12-21 VITALS — BP 104/64 | HR 73 | Ht 72.0 in | Wt 268.8 lb

## 2024-12-21 DIAGNOSIS — R600 Localized edema: Secondary | ICD-10-CM | POA: Diagnosis not present

## 2024-12-21 DIAGNOSIS — I5189 Other ill-defined heart diseases: Secondary | ICD-10-CM | POA: Diagnosis not present

## 2024-12-21 DIAGNOSIS — I10 Essential (primary) hypertension: Secondary | ICD-10-CM

## 2024-12-21 DIAGNOSIS — I25118 Atherosclerotic heart disease of native coronary artery with other forms of angina pectoris: Secondary | ICD-10-CM | POA: Diagnosis not present

## 2024-12-21 DIAGNOSIS — E785 Hyperlipidemia, unspecified: Secondary | ICD-10-CM | POA: Diagnosis not present

## 2024-12-21 DIAGNOSIS — I7121 Aneurysm of the ascending aorta, without rupture: Secondary | ICD-10-CM | POA: Diagnosis not present

## 2024-12-21 LAB — BASIC METABOLIC PANEL WITH GFR
BUN: 31 mg/dL — ABNORMAL HIGH (ref 6–23)
CO2: 34 meq/L — ABNORMAL HIGH (ref 19–32)
Calcium: 10.1 mg/dL (ref 8.4–10.5)
Chloride: 97 meq/L (ref 96–112)
Creatinine, Ser: 1.01 mg/dL (ref 0.40–1.50)
GFR: 75.6 mL/min
Glucose, Bld: 171 mg/dL — ABNORMAL HIGH (ref 70–99)
Potassium: 3.8 meq/L (ref 3.5–5.1)
Sodium: 140 meq/L (ref 135–145)

## 2024-12-21 MED ORDER — ATENOLOL 25 MG PO TABS: 25.0000 mg | ORAL_TABLET | Freq: Every day | ORAL | 1 refills | Status: AC

## 2024-12-21 NOTE — Progress Notes (Signed)
 " Cardiology Office Note   Date:  12/21/2024  ID:  Leon Nelson, Leon Nelson 1956-06-08, MRN [id number] PCP: Leon Nelson ORN, MD  [facility] Providers Cardiologist:  None Cardiology APP:  Leon Rosaline HERO, NP     History of Present Illness Leon Nelson is a 68 y.o. male with hx of HLD, HTN, dilation of asceding arota (02/2024 CT 4.3cm), CAD (02/26/24 CAC score 692), DM2, obesity.   Family history notable of hypertrophic cardiomyopathy in his mother who underwent septal myectomy at [facility] in 14. He tested negative for this gene per his report. Father died of MVC in 01-27-1998.   Established with cardiology 2019/01/27 for presyncope. Echo 06/12/19 with normal LVEF, mild LVH, impaired relaxation, no significant valvular disease. Event monitor with no arrhythmias. Re-established with cardiology 05/06/24 after elevated coronary calcium  score of 692 placing him in 80th percentile for age/sex matched controls.  Echo 04/2024 normal LVEF, gr1dd, no RWMA, normal RV, no significant valvular disease. Cardiac PET 07/07/24 due to concerning symtpoms with no ischemia nor infarction, LVEF 47% which improved to 54% at rest, low risk study. 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 He reported no LE edema.  He saw Dr. Theodoro of pulmonology 11/12/24. His PFTs showed mild restrictive pattern with normal DLCO. Obesity thought to contribute. He noted LE edema, LE duplex with no DVT bilaterally, and was advised to start Lasix  40mg  daily after his upcoming orthopedic surgery. Due to lightheadedness, CTA chest negative for PE.   On 11/24/24 he had shoulder arthroplasty.   Presents today for follow up. Weight down 10 lbs from clinic visit 3 months ago. He had BMP earlier today, results not yet available.   He notes he did not notice swelling until pulmonology pointed it out. His primary care had previously pointed out mild swelling. Reports the day prior to his  orthopedic surgery his feet were swollen for the first time. He has been taking Lasix  now for 2 weeks. He reports no swelling in his feet. He has mild swelling in his legs by the end of the day with a sock line. Reports no shortness of breath. He attributes prior dyspnea with walking trashcans to the curb to his age. He is participating in PT after shoulder surgery without issue.  ROS: Please see the history of present illness.    All other systems reviewed and are negative.   Studies Reviewed      Cardiac Studies & Procedures   ______________________________________________________________________________________________   STRESS TESTS  NM PET CT CARDIAC PERFUSION MULTI W/ABSOLUTE BLOODFLOW 07/07/2024  Narrative   LV perfusion is normal. There is no evidence of ischemia. There is no evidence of infarction.   Rest left ventricular function is abnormal. Rest global function is mildly reduced. There were no regional wall motion abnormalities. Rest EF: 47%. Stress left ventricular function is normal. Stress EF: 54%. End diastolic cavity size is normal. End systolic cavity size is mildly enlarged.   Myocardial blood flow was computed to be 1.06ml/g/min at rest and 1.36ml/g/min at stress. Global myocardial blood flow reserve was 1.74 and was mildly abnormal. May be due to high resting flow   Coronary calcium  was present on the attenuation correction CT images. Moderate coronary calcifications were present. Coronary calcifications were present in the left anterior descending artery and right coronary artery distribution(s).   Findings are consistent with no ischemia and no infarction. The study is low risk.  CLINICAL DATA:  This over-read does not include interpretation of cardiac or coronary anatomy or pathology. The Cardiac PET CT interpretation by the cardiologist is attached.  The CT imaging obtained is limited due to only obtained axial soft tissue imaging with no axial lung window  reconstruction or multiplanar coronal or sagittal reconstructions.  COMPARISON:  None Available.  FINDINGS: Vascular: No significant non-cardiac vascular findings.  Mediastinum/Nodes: No incidental masses or lymphadenopathy.  Lungs/Pleura: Calcified granuloma of the right upper lobe. Visualized lungs show no consolidation, edema, effusions or pneumothorax.  Upper Abdomen: Calcified granulomata in the visualized liver. Hepatic steatosis.  Musculoskeletal: Unremarkable visualized bony structures.  IMPRESSION: 1. Evidence of prior granulomatous disease with calcified granuloma of the right upper lobe and calcified granulomata in the visualized liver. 2. Hepatic steatosis.   Electronically Signed By: Leon Nelson M.D. On: 07/07/2024 12:09   ECHOCARDIOGRAM  ECHOCARDIOGRAM COMPLETE 05/19/2024  Narrative ECHOCARDIOGRAM REPORT    Patient Name:   Leon Nelson Date of Exam: 05/19/2024 Medical Rec #:  [id number]       Height:       72.0 in Accession #:    [id number]      Weight:       284.0 lb Date of Birth:  06-Dec-1956      BSA:          2.473 m Patient Age:    72 years        BP:           118/81 mmHg Patient Gender: M               HR:           64 bpm. Exam Location:  [address]  Procedure: 2D Echo, 3D Echo, Cardiac Doppler, Color Doppler and Strain Analysis (Both Spectral and Color Flow Doppler were utilized during procedure).  Indications:    R06.00 Dyspnea  History:        Patient has prior history of Echocardiogram examinations, most recent 06/12/2019.  Sonographer:    Waldo Guadalajara RCS Referring Phys: [address] [place]  IMPRESSIONS   1. Left ventricular ejection fraction, by estimation, is 55%. Left ventricular ejection fraction by 3D volume is 54 %. The left ventricle has normal function. The left ventricle has no regional wall motion abnormalities. Left ventricular diastolic parameters are consistent with Grade I diastolic dysfunction (impaired  relaxation). The average left ventricular global longitudinal strain is -18.6 %. The global longitudinal strain is normal. 2. Right ventricular systolic function is normal. The right ventricular size is normal. There is normal pulmonary artery systolic pressure. The estimated right ventricular systolic pressure is 23.8 mmHg. 3. The mitral valve is normal in structure. Trivial mitral valve regurgitation. No evidence of mitral stenosis. 4. The aortic valve is tricuspid. There is mild calcification of the aortic valve. Aortic valve regurgitation is trivial. No aortic stenosis is present. 5. Aortic dilatation noted. There is mild dilatation of the ascending aorta, measuring 40 mm. 6. The inferior vena cava is normal in size with greater than 50% respiratory variability, suggesting right atrial pressure of 3 mmHg.  FINDINGS Left Ventricle: Left ventricular ejection fraction, by estimation, is 55%. Left ventricular ejection fraction by 3D volume is 54 %. The left ventricle has normal function. The left ventricle has no regional wall motion abnormalities. The average left ventricular global longitudinal strain is -18.6 %. Strain was performed and the global longitudinal strain is normal. The left ventricular internal cavity size was normal in size. There is  no left ventricular hypertrophy. Left ventricular diastolic parameters are consistent with Grade I diastolic dysfunction (impaired relaxation).  Right Ventricle: The right ventricular size is normal. No increase in right ventricular wall thickness. Right ventricular systolic function is normal. There is normal pulmonary artery systolic pressure. The tricuspid regurgitant velocity is 2.28 m/s, and with an assumed right atrial pressure of 3 mmHg, the estimated right ventricular systolic pressure is 23.8 mmHg.  Left Atrium: Left atrial size was normal in size.  Right Atrium: Right atrial size was normal in size.  Pericardium: There is no evidence of  pericardial effusion.  Mitral Valve: The mitral valve is normal in structure. Mild mitral annular calcification. Trivial mitral valve regurgitation. No evidence of mitral valve stenosis.  Tricuspid Valve: The tricuspid valve is normal in structure. Tricuspid valve regurgitation is trivial.  Aortic Valve: The aortic valve is tricuspid. There is mild calcification of the aortic valve. Aortic valve regurgitation is trivial. Aortic regurgitation PHT measures 1530 msec. No aortic stenosis is present.  Pulmonic Valve: The pulmonic valve was normal in structure. Pulmonic valve regurgitation is not visualized.  Aorta: The aortic root is normal in size and structure and aortic dilatation noted. There is mild dilatation of the ascending aorta, measuring 40 mm.  Venous: The inferior vena cava is normal in size with greater than 50% respiratory variability, suggesting right atrial pressure of 3 mmHg.  IAS/Shunts: No atrial level shunt detected by color flow Doppler.  Additional Comments: 3D was performed not requiring image post processing on an independent workstation and was normal.   LEFT VENTRICLE PLAX 2D LVIDd:         4.50 cm         Diastology LVIDs:         3.20 cm         LV e' medial:    5.22 cm/s LV PW:         1.40 cm         LV E/e' medial:  12.8 LV IVS:        1.00 cm         LV e' lateral:   8.05 cm/s LVOT diam:     2.00 cm         LV E/e' lateral: 8.3 LV SV:         72 LV SV Index:   29              2D Longitudinal LVOT Area:     3.14 cm        Strain 2D Strain GLS   -16.7 % (A4C): 2D Strain GLS   -15.1 % (A3C): 2D Strain GLS   -24.1 % (A2C): 2D Strain GLS   -18.6 % Avg:  3D Volume EF LV 3D EF:    Left ventricul ar ejection fraction by 3D volume is 54 %.  3D Volume EF: 3D EF:        54 % LV EDV:       123 ml LV ESV:       56 ml LV SV:        66 ml  RIGHT VENTRICLE RV Basal diam:  3.70 cm RV S prime:     11.30 cm/s TAPSE (M-mode): 2.8 cm RVSP:            23.8 mmHg  LEFT ATRIUM             Index        RIGHT ATRIUM  Index LA diam:        5.00 cm 2.02 cm/m   RA Pressure: 3.00 mmHg LA Vol (A2C):   76.6 ml 30.98 ml/m  RA Area:     13.60 cm LA Vol (A4C):   50.8 ml 20.54 ml/m  RA Volume:   29.70 ml  12.01 ml/m LA Biplane Vol: 65.4 ml 26.45 ml/m AORTIC VALVE LVOT Vmax:   90.70 cm/s LVOT Vmean:  62.400 cm/s LVOT VTI:    0.229 m AI PHT:      1530 msec  AORTA Ao Root diam: 3.00 cm Ao Asc diam:  4.00 cm  MITRAL VALVE               TRICUSPID VALVE MV Area (PHT):             TR Peak grad:   20.8 mmHg MV Decel Time:             TR Vmax:        228.00 cm/s MV E velocity: 66.60 cm/s  Estimated RAP:  3.00 mmHg MV A velocity: 99.60 cm/s  RVSP:           23.8 mmHg MV E/A ratio:  0.67 SHUNTS Systemic VTI:  0.23 m Systemic Diam: 2.00 cm  Dalton McleanMD Electronically signed by Ezra Kanner Signature Date/Time: 05/19/2024/1:47:46 PM    Final    MONITORS  CARDIAC EVENT MONITOR 07/29/2019  Narrative 1: Sinus rhythm/sinus bradycardia/sinus tachycardia 2: Occasional PVCs   CT SCANS  CT CARDIAC SCORING (SELF PAY ONLY) 02/26/2024  Addendum 03/16/2024 11:31 PM ADDENDUM REPORT: 03/16/2024 23:29  EXAM: OVER-READ INTERPRETATION  CT CHEST  The following report is an over-read performed by radiologist Dr. Oneil Devonshire of [facility] on 03/16/2024. This over-read does not include interpretation of cardiac or coronary anatomy or pathology. The coronary calcium  score interpretation by the cardiologist is attached.  COMPARISON:  None.  FINDINGS: Cardiovascular: Dilatation of the ascending aorta to 4.3 cm is noted. Normal tapering is seen in the arch.  Mediastinum/Nodes: There are no enlarged lymph nodes within the visualized mediastinum.  Lungs/Pleura: There is no pleural effusion. The visualized lungs appear clear.  Upper abdomen: Few calcified granulomas are noted within the liver.  Musculoskeletal/Chest  wall: No chest wall mass or suspicious osseous findings within the visualized chest.  IMPRESSION: Dilatation of the ascending aorta to 4.3 cm. Recommend annual imaging followup by CTA or MRA. This recommendation follows 2010 ACCF/AHA/AATS/ACR/ASA/SCA/SCAI/SIR/STS/SVM Guidelines for the Diagnosis and Management of Patients with Thoracic Aortic Disease. Circulation. 2010; 121: Z733-z630. Aortic aneurysm NOS (ICD10-I71.9)   Electronically Signed By: Oneil Devonshire M.D. On: 03/16/2024 23:29  Narrative CLINICAL DATA:  Cardiovascular Disease Risk stratification  EXAM: Coronary Calcium  Score  TECHNIQUE: A gated, non-contrast computed tomography scan of the heart was performed using 3mm slice thickness. Axial images were analyzed on a dedicated workstation. Calcium  scoring of the coronary arteries was performed using the Agatston method.  FINDINGS: Coronary Calcium  Score:  Left main: 0  Left anterior descending artery: 588  Left circumflex artery: 24.5  Right coronary artery: 79.6  Total: 692  Percentile: 80th  Pericardium: Normal.  Ascending Aorta: Evidence of ascending aortic dilation, 43.34mm, on non-contrasted study.  IMPRESSION: 1. Coronary calcium  score of 692. This was 80th percentile for age-, race-, and sex-matched controls.  2. Evidence of ascending aortic dilation, 43.21mm, on non-contrasted study. Recommend annual imaging followup with either CT chest without contrast or CTA/MRA Aorta Protocol. This recommendation follows 2010 ACCF/AHA/AATS/ACR/ASA/SCA/SCAI/SIR/STS/SVM Guidelines for  the Diagnosis and Management of Patients with Thoracic Aortic Disease. Circulation. 2010; 121: Z733-z630.  3. Non-cardiac: See separate report from [facility].  RECOMMENDATIONS: Coronary artery calcium  (CAC) score is a strong predictor of incident coronary heart disease (CHD) and provides predictive information beyond traditional risk factors. CAC scoring  is reasonable to use in the decision to withhold, postpone, or initiate statin therapy in intermediate-risk or selected borderline-risk asymptomatic adults (age 36-75 years and LDL-C >=70 to <190 mg/dL) who do not have diabetes or established atherosclerotic cardiovascular disease (ASCVD).* In intermediate-risk (10-year ASCVD risk >=7.5% to <20%) adults or selected borderline-risk (10-year ASCVD risk >=5% to <7.5%) adults in whom a CAC score is measured for the purpose of making a treatment decision the following recommendations have been made:  If CAC=0, it is reasonable to withhold statin therapy and reassess in 5 to 10 years, as long as higher risk conditions are absent (diabetes mellitus, family history of premature CHD in first degree relatives (males <55 years; females <65 years), cigarette smoking, or LDL >=190 mg/dL).  If CAC is 1 to 99, it is reasonable to initiate statin therapy for patients >=64 years of age.  If CAC is >=100 or >=75th percentile, it is reasonable to initiate statin therapy at any age.  Cardiology referral should be considered for patients with CAC scores >=400 or >=75th percentile.  *2018 AHA/ACC/AACVPR/AAPA/ABC/ACPM/ADA/AGS/APhA/ASPC/NLA/PCNA Guideline on the Management of Blood Cholesterol: A Report of the American College of Cardiology/American Heart Association Task Force on Clinical Practice Guidelines. J Am Coll Cardiol. 2019;73(24):3168-3209.  Madonna Large, DO, [facility]  Electronically Signed: By: Madonna Large D.O. On: 02/26/2024 18:22     ______________________________________________________________________________________________      Risk Assessment/Calculations           Physical Exam VS:  BP 104/64   Pulse 73   Ht 6' (1.829 m)   Wt 268 lb 12.8 oz (121.9 kg)   SpO2 96%   BMI 36.46 kg/m        Wt Readings from Last 3 Encounters:  12/21/24 268 lb 12.8 oz (121.9 kg)  11/24/24 268 lb 1.3 oz (121.6 kg)  11/12/24 272 lb (123.4  kg)    GEN: Well nourished, well developed in no acute distress NECK: No JVD; No carotid bruits CARDIAC: RRR, no murmurs, rubs, gallops RESPIRATORY:  Clear to auscultation without rales, wheezing or rhonchi  ABDOMEN: Soft, non-tender, non-distended EXTREMITIES:  No edema; No deformity   ASSESSMENT AND PLAN  DOE / LE edema - Reports no DOE. Attributes prior DOE to age and deconditioning. Reports he did not notice LE edema til pointed out by pulmonary. No appreciable edema on exam. Discussed possible etiology venous insufficiency. Does feel it is improved on Lasix  40mg  daily, prefers to continue. Awaiting BMP from earlier today. Consider Furosemide  20mg  vs 40mg  pending renal function and potassium. To avoid duplicate therapy, stop Atenolol -Chlorthalidone  and transition to Atenolol  25mg  daily.   Nonobstructive CAD - Stable with no anginal symptoms. No indication for ischemic evaluation.  Low risk PET 06/2024. GDMT aspirin  81mg  daily, atenolol25 mg daily, rosuvastatin  40mg  daily. Recommend aiming for 150 minutes of moderate intensity activity per week and following a heart healthy diet.    HLD, LDL goal <55 - Continue Rosuvastatin  40mg  daily.   HTN - BP at goal. Would avoid using both thiazide and loop diuretic long term. Stop atenolol -chlorthalidone , start atenolol  25mg  daily. Continue Lasix  40mg  daily. BMP ealier today, awaiting results. Will send refills  of Lasix  20mg  vs 40mg  pending  lab results. Continue Lisinopril  20mg  daily. Discussed to monitor BP at home at least 2 hours after medications and sitting for 5-10 minutes.   DM2 / Obesity - Weight loss via diet and exercise encouraged. Discussed the impact being overweight would have on cardiovascular risk.   Ascending aortic dialtion - CT 02/2024 4.3 cm ? 08/2024 CT 4.0 cm. Annual monitoring recommended. Repeat CT aorta 08/2025 ordered. Continue Atenolol  and optimal BP control. Avoid fluoroquinolones.        Dispo: follow up 08/2025 with Rosaline Bane, NP  Signed, Reche GORMAN Finder, NP   "

## 2024-12-21 NOTE — Patient Instructions (Addendum)
 Medication Instructions:  STOP Atenolol -Chlorthalidone   START Atenolol  25mg  one tablet daily  CONTINUE Furosemide  (Lasix ) 40mg  daily When we review your labs tomorrow we will send future refills.  *If you need a refill on your cardiac medications before your next appointment, please call your pharmacy*  Lab Work: We will let you know how your lab work was from today.  Follow-Up: At [facility], you and your health needs are our priority.  As part of our continuing mission to provide you with exceptional heart care, our providers are all part of one team.  This team includes your primary Cardiologist (physician) and Advanced Practice Providers or APPs (Physician Assistants and Nurse Practitioners) who all work together to provide you with the care you need, when you need it.  Your next appointment:   September 2026 with Rosaline Bane, NP with CT Aorta prior to office visit  We recommend signing up for the patient portal called MyChart.  Sign up information is provided on this After Visit Summary.  MyChart is used to connect with patients for Virtual Visits (Telemedicine).  Patients are able to view lab/test results, encounter notes, upcoming appointments, etc.  Non-urgent messages can be sent to your provider as well.   To learn more about what you can do with MyChart, go to [email or web address].   Other Instructions  To prevent or reduce lower extremity swelling: Eat a low salt diet. Salt makes the body hold onto extra fluid which causes swelling. Sit with legs elevated. For example, in the recliner or on an ottoman.  Wear knee-high compression stockings during the daytime. Ones labeled 15-20 mmHg provide good compression.

## 2024-12-22 ENCOUNTER — Encounter: Payer: Self-pay | Admitting: Rehabilitative and Restorative Service Providers"

## 2024-12-22 ENCOUNTER — Encounter (HOSPITAL_BASED_OUTPATIENT_CLINIC_OR_DEPARTMENT_OTHER): Payer: Self-pay

## 2024-12-22 ENCOUNTER — Ambulatory Visit: Admitting: Rehabilitative and Restorative Service Providers"

## 2024-12-22 DIAGNOSIS — M25511 Pain in right shoulder: Secondary | ICD-10-CM

## 2024-12-22 DIAGNOSIS — R0609 Other forms of dyspnea: Secondary | ICD-10-CM

## 2024-12-22 DIAGNOSIS — R29898 Other symptoms and signs involving the musculoskeletal system: Secondary | ICD-10-CM

## 2024-12-22 DIAGNOSIS — M6281 Muscle weakness (generalized): Secondary | ICD-10-CM

## 2024-12-22 MED ORDER — FUROSEMIDE 20 MG PO TABS: 20.0000 mg | ORAL_TABLET | Freq: Every day | ORAL | 1 refills | Status: AC

## 2024-12-22 NOTE — Therapy (Signed)
 " OUTPATIENT PHYSICAL THERAPY SHOULDER TREATMENT  Patient Name: Leon Nelson MRN: [id number] DOB:1956-06-19, 68 y.o., male Today's Date: 12/22/2024  END OF SESSION:  PT End of Session - 12/22/24 1402     Visit Number 5    Number of Visits 24    Date for Recertification  02/22/25    Authorization Type [employer] medicare auth required $20 copay    Authorization Time Period 11/30/24 - 02/22/25    Authorization - Visit Number 5    Authorization - Number of Visits 24    Progress Note Due on Visit 10    PT Start Time 1404    PT Stop Time 1445    PT Time Calculation (min) 41 min    Activity Tolerance Patient tolerated treatment well    Behavior During Therapy [facility] for tasks assessed/performed         Past Medical History:  Diagnosis Date   ABDOMINAL PAIN OTHER SPECIFIED SITE 07/20/2008   Arthritis Currently   CHEST PAIN 02/06/2011   COLONIC POLYPS, HX OF 05/26/2008   DEPRESSION 05/26/2008   DIABETES MELLITUS, TYPE II 05/26/2008   HYPERLIPIDEMIA 05/26/2008   HYPERTENSION 05/26/2008   HYPOTHYROIDISM 05/26/2008   KNEE PAIN, RIGHT, ACUTE 07/01/2008   Personal history of urinary calculi 05/26/2008   Pure hypercholesterolemia 05/19/2024   UTI'S, HX OF 05/26/2008   Past Surgical History:  Procedure Laterality Date   ANKLE SURGERY Left    JOINT REPLACEMENT  09/2009   Right Knee   REVERSE SHOULDER ARTHROPLASTY Right 11/24/2024   Procedure: ARTHROPLASTY, SHOULDER, TOTAL, REVERSE;  Surgeon: Genelle Standing, MD;  Location:  SURGERY CENTER;  Service: Orthopedics;  Laterality: Right;   s/p right knee arthroplasty      complicated by patellar tendon rupture Jan 2011 Dr. heide   TONSILLECTOMY     Patient Active Problem List   Diagnosis Date Noted   Traumatic complete tear of right rotator cuff 11/24/2024   Fatty liver 07/29/2024   Joint pain 05/19/2024   Low back pain 05/19/2024   Thrombosed external hemorrhoids 05/19/2024   Work stress 04/09/2024   Coronary artery disease  due to lipid rich plaque 04/09/2024   DOE (dyspnea on exertion) 04/07/2024   Ascending aortic aneurysm 03/17/2024   Neuropathy 02/02/2024   Changes in vision 09/14/2023   Urinary frequency 01/23/2023   Constipation 01/23/2023   RUQ pain 04/24/2021   Vitamin D  deficiency 04/24/2021   Balance disorder 06/22/2020   Dizziness 06/22/2020   Irritable bowel syndrome, unspecified 01/15/2019   Rhabdomyolysis 01/08/2019   Left cervical radiculopathy 01/27/2018   Left hand paresthesia 01/27/2018   Hypokalemia 03/27/2017   Heart murmur previously undiagnosed 08/14/2011   Diabetes mellitus type 2 with neurological manifestations (HCC) 05/26/2008   Essential hypertension 05/26/2008   History of colonic polyps 05/26/2008   PCP: Dr [name] REFERRING PROVIDER: Dr [name] REFERRING DIAG: R Reverse TSA THERAPY DIAG:  Acute pain of right shoulder  Other symptoms and signs involving the musculoskeletal system  Muscle weakness (generalized)  Rationale for Evaluation and Treatment: Rehabilitation  ONSET DATE: 11/24/24  SUBJECTIVE:  SUBJECTIVE STATEMENT: The patient reports that his arm is hurting more since Christmas. I have a lot of pain with my exercises. He notes he did them all, but he feels more pain and his range is not as good.  He is continuing to wear the sling off and on due to pain. Every time I move my arm I get sharp pain at the shoulder joint.  EVAL: Patient reports that he had a total tear of the rotator cuff in his R shoulder. Patient reports that he fell 07/27/24, landing body weight on the R shoulder. He had pain and was unable to lift R UE or use arm. Diagnostic tests revealed torn rotator cuff. Patient underwent reverse total shoulder replacement 11/24/24. Post op course has been  uncomplicated. He has continued pain at 7-8/10 level. He is sleeping in the recliner. Pain free R shoulder at conclusion of treatment. Hand dominance: Right  PERTINENT HISTORY: Type II diabetes; hyperlipidemia; HTN; hypothyroidism: R knee pain; abdominal pain; depression; R TKA; ankle stabilization surgery 2020  PAIN:  Are you having pain? Yes: NPRS scale: 8/10 today, noting sharp pain in shoulder joint; 0/10 at end of session Pain location: R shoulder Pain description: solid pain; constant; intensity worse with movement  Aggravating factors: movement  Relieving factors: advil ; tylonol;   PRECAUTIONS: Shoulder R reverse TSA per protocol   WEIGHT BEARING RESTRICTIONS: Yes   FALLS:  Has patient fallen in last 6 months? Yes. 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  DIAGNOSTIC FINDINGS:  CT R shoulder 09/30/24: 1. Moderate glenohumeral joint osteoarthritis. 2. Severe acromioclavicular joint osteoarthritis. 3. Mild atrophy of the subscapularis and infraspinatus muscles.  PATIENT SURVEYS: (survey based on patient's subjective report of symptoms and activity level)  Quick DASH - 88.6/100; 88.6%      SENSATION: WFL  POSTURE: Head forward; shoulders rounded and elevated; increased thoracic kyphosis  UPPER EXTREMITY ROM: R hand, wrist, forearm, elbow WFL's    R shoulder ROM not assessed today    L UE WFL's     12/14/24: PROM R shoulder previously assessed in sitting  Passive ROM Right 12/02/24 Right   12/08/24  Right  12/14/24 Supine   Shoulder flexion 75 deg 95 141  Shoulder extension     Shoulder abduction (scaption) 40 80 80  Shoulder adduction     Shoulder internal rotation     Shoulder external rotation  (in scapular plane)  20  30 32  Elbow flexion     Elbow extension     Wrist flexion     Wrist extension     Wrist ulnar deviation     Wrist radial deviation     Wrist pronation     Wrist supination     (Blank rows = not tested)  UPPER EXTREMITY MMT: not assessed  MMT Right eval Left eval  Shoulder flexion    Shoulder extension    Shoulder abduction    Shoulder adduction    Shoulder internal rotation    Shoulder external rotation  Middle trapezius    Lower trapezius    Elbow flexion    Elbow extension    Wrist flexion    Wrist extension    Wrist ulnar deviation    Wrist radial deviation    Wrist pronation    Wrist supination    Grip strength (lbs)    (Blank rows = not tested)  PALPATION:   Muscular tightness upper trap   [facility] Adult PT Treatment:                                                DATE: 12/22/24 Therapeutic Exercise: Pendulum flexion, CW and CCW x 15 each Supine AAROM shoulder flexion Supine Attempted cane press AAROM--  discontinued due to pain/discomfort Manual Therapy: PROM R shoulder flexion; scaption; ER in scapular plane Gentle rocking to relax musculature before PROM Therapeutic Activity: Standing AAROM with arm on physioball staying in pain free range Seated AAROM with arm supported on table flexion x 10 reps Gentle isometrics-- discontinued R shoulder flexion and abduction today due to mild pain-- continued with shoulder extension isometrics with arm at neutral supported by towel in scapular plane x 5 reps (reduces pain) Modalities: To ice at home   [facility] Adult PT Treatment:                                                DATE: 12/14/24 Therapeutic Exercise: Pendulum flexion; horizontal ab/adduction; circles CW/CCW x 15  each  Manual Therapy: (patient supine) STM R upper trap; forearm area  PROM R shoulder flexion; scaption; ER in scapular plane  Scar massage  Neuromuscular re-ed: Chin tuck 5 sec x 10  Scap squeeze 5 sec x 10  Isometric R shoulder flexion; abduction; extension without resistance to activate all heads of the deltoid sitting scap squeeze 2 sec x 10 ea Therapeutic Activity: Shoulder flexion step back 5 sec x 5  Table slide shoulder flexion 5 sec x 3 (patient does in standing at counter at home to achieve proper height) Supine - assisting with L hand for AAROM R shoulder flexion keeping scapulae down and back (no popping or pain reported) Modalities: To ice at home  Self Care: Instructed in sleeping position and exercises supine for home using bolster and pillows trial in clinic - took photo for patient on his phone Reviewed sling position  Continue with sling for comfort and UE support - discussed weaning fro sling at home  Can remove sling with R UE supported at side on pillow    [facility] Adult PT Treatment:                                                DATE: 12/08/24 Therapeutic Exercise: AROM R hand, wrist, forearm, elbow Pendulum flexion; horizontal ab/adduction; circles CW/CCW x 15 each  Manual Therapy: (patient sitting) STM R upper trap; forearm area  PROM R shoulder elevation; ER in scapular plane  Neuromuscular re-ed: Chin tuck 5 sec x 10  Scap squeeze 5 sec x 10  Therapeutic Activity: Shoulder flexion step back 5 sec x 5  Table slide shoulder  flexion 5 sec x 1 ER with cane elbow supported 5 sec x 5  Modalities: To ice at home  Self Care: Adjusted sling for sleep position Continue with sling for comfort and UE support - weaning Can remove sling with R UE supported at side on pillow    PATIENT EDUCATION: Education details: POC;HEP  Person educated: Patient Education method: Explanation, Demonstration, Tactile cues, Verbal cues, and Handouts Education comprehension:  verbalized understanding, returned demonstration, verbal cues required, tactile cues required, and needs further education  HOME EXERCISE PROGRAM: Access Code: [email or web address] URL: [email or web address] Date: 12/14/2024 Prepared by: Celyn Holt  Program Notes -sitting with chest up and shoulder down and back -pretend to push arm forward, out to side, and backward without moving arm-3 seconds x 10 -2 x day   Exercises - Seated Scapular Retraction  - 2 x daily - 7 x weekly - 1-2 sets - 10 reps - 10 sec  hold - Seated Cervical Retraction  - 2 x daily - 7 x weekly - 1-2 sets - 5-10 reps - 10 sec  hold - Flexion-Extension Shoulder Pendulum with Table Support  - 3-4 x daily - 7 x weekly - 1 sets - 20-30 reps - Horizontal Shoulder Pendulum with Table Support  - 3-4 x daily - 7 x weekly - 1 sets - 20-30 reps - Circular Shoulder Pendulum with Table Support  - 3-4 x daily - 7 x weekly - 1 sets - 20-30 reps - Standing 'L' Stretch at Counter  - 2 x daily - 7 x weekly - 1 sets - 5-10 reps - 5 sec  hold - Seated Shoulder Flexion Towel Slide at Table Top Full Range of Motion  - 2 x daily - 7 x weekly - 1 sets - 5-10 reps - 10sec  hold - Seated Shoulder External Rotation AAROM with Dowel  - 2 x daily - 7 x weekly - 1 sets - 5-10 reps - 5 sec  hold - Supine Shoulder Flexion AAROM with Hands Clasped  - 2 x daily - 7 x weekly - 1 sets - 5-10 reps - 2-3 sec  hold  ASSESSMENT:  CLINICAL IMPRESSION: The patient and PT discussed his pain control-- this pain began when he stopped all pain medications within the past week. We discussed continued use per MD recommendations-- he had even stopped over the counter meds (Tylenol ).  We performed HEP components in therapy session today and he tolerated them well. We reviewed that he should not be pushing into pain and reviewed restricted movements (reaching behind back and across body). PT to continue working towards STGs/ LTGs. His pain was at 0/10 at end of session  today.  Eval: Patient is a 68 y.o. male who was seen today for physical therapy evaluation and treatment s/p R TSA 11/24/24 following a fall with injury 07/27/24. He has been out of the sling since ~ the second post op day per MD instructions. Discussed supporting R UE when out of the sling to allow musculature of R shoulder girdle to rest and heal without need to support the weight of the UE. Patient presents with rounded posture and alignent through the R shoulder girdle and thoracic spine; limited ROM, strength, function of R UE following reverse TSA. Of concern, he continues to report pain at 7-8/10 on a daily basis. Patient has decreased functional activities and ADL's. Advait will benefit from PT to address deficits and progress with shoulder rehab.   OBJECTIVE IMPAIRMENTS: decreased activity tolerance, decreased mobility,  decreased ROM, decreased strength, increased muscle spasms, impaired UE functional use, improper body mechanics, postural dysfunction, and pain.   GOALS: Goals reviewed with patient? Yes  SHORT TERM GOALS: Target date: 01/11/2025  Independent in initial HEP  Baseline: Goal status: INITIAL  2.  Improve posture and alignment through shoulder girdle with patient demonstrating activation of posterior shoulder girdle musculature to improve shoulder function  Baseline:  Goal status: INITIAL  3.  P/AAROM R shoulder to 130 deg elevation and 30 deg ER in scapular plane Baseline:  Goal status: INITIAL  LONG TERM GOALS: Target date: 02/22/2025  Progress shoulder rehab per protocol or as directed by MD  Baseline:  Goal status: INITIAL  2.  PROM 145 deg elevation and 40 deg ER in scapular plane; functional IR to L! Baseline:  Goal status: INITIAL  3.  Decrease pain to 0/10 to no more than 3/10  Baseline:  Goal status: INITIAL  4.  Return to functional activities using R UE including return to work at desk/computer/phone  Baseline:  Goal status: INITIAL  5.  Improve  Quick DASH score by 10% indicating improved functional activities  Baseline:  Goal status: INITIAL  6.  Independent in advanced HEP  Baseline:  Goal status: INITIAL PLAN:  PT FREQUENCY: 2x/week  PT DURATION: 12 weeks  PLANNED INTERVENTIONS: 97164- PT Re-evaluation, 97110-Therapeutic exercises, 97530- Therapeutic activity, 97112- Neuromuscular re-education, 97535- Self Care, 02859- Manual therapy, [phone]- Aquatic Therapy, Patient/Family education, and Taping  PLAN FOR NEXT SESSION: review and progress exercise per protocol; manual work and modalities as indicated; progress rehab per protocol and/or as ordered  - trial of isometric standing for activation of deltoid per protocol; ER with cane for AAROM; continued with exercises per protocol  DID NOT PROGRESS HEP DUE TO increased pain at today's session.   Lashaunda Schild, PT 12/22/2024, 2:02 PM  "

## 2024-12-23 NOTE — Progress Notes (Unsigned)
 "  Subjective:     Patient ID: Leon Nelson, male    DOB: 1956-06-19, 68 y.o.   MRN: [id number]  No chief complaint on file.   HPI  Discussed the use of AI scribe software for clinical note transcription with the patient, who gave verbal consent to proceed.  PMHx- HTN, HLD, dilation of asceding arota (02/2024 CT 4.3cm), CAD (02/26/24 CAC score 692), DM2, obesity.   Follows with: Cardiology, Pulmonology,  History of Present Illness              Health Maintenance Due  Topic Date Due   Diabetic kidney evaluation - Urine ACR  02/17/2010    Past Medical History:  Diagnosis Date   ABDOMINAL PAIN OTHER SPECIFIED SITE 07/20/2008   Arthritis Currently   CHEST PAIN 02/06/2011   COLONIC POLYPS, HX OF 05/26/2008   DEPRESSION 05/26/2008   DIABETES MELLITUS, TYPE II 05/26/2008   HYPERLIPIDEMIA 05/26/2008   HYPERTENSION 05/26/2008   HYPOTHYROIDISM 05/26/2008   KNEE PAIN, RIGHT, ACUTE 07/01/2008   Personal history of urinary calculi 05/26/2008   Pure hypercholesterolemia 05/19/2024   UTI'S, HX OF 05/26/2008    Past Surgical History:  Procedure Laterality Date   ANKLE SURGERY Left    JOINT REPLACEMENT  09/2009   Right Knee   REVERSE SHOULDER ARTHROPLASTY Right 11/24/2024   Procedure: ARTHROPLASTY, SHOULDER, TOTAL, REVERSE;  Surgeon: Genelle Standing, MD;  Location: [facility];  Service: Orthopedics;  Laterality: Right;   s/p right knee arthroplasty      complicated by patellar tendon rupture Jan 2011 Dr. heide   TONSILLECTOMY      Family History  Problem Relation Age of Onset   Cancer Mother        breast cancer   Hyperlipidemia Mother    Heart disease Mother    Hyperlipidemia Father    Heart disease Father    Alcohol abuse Other    Diabetes Other     Social History   Socioeconomic History   Marital status: Married    Spouse name: Not on file   Number of children: Not on file   Years of education: Not on file   Highest education level: Some  college, no degree  Occupational History   Not on file  Tobacco Use   Smoking status: Never   Smokeless tobacco: Never  Vaping Use   Vaping status: Never Used  Substance and Sexual Activity   Alcohol use: No   Drug use: No   Sexual activity: Yes  Other Topics Concern   Not on file  Social History Narrative   married   Social Drivers of Health   Tobacco Use: Low Risk (12/22/2024)   Patient History    Smoking Tobacco Use: Never    Smokeless Tobacco Use: Never    Passive Exposure: Not on file  Financial Resource Strain: Low Risk (07/25/2024)   Overall Financial Resource Strain (CARDIA)    Difficulty of Paying Living Expenses: Not hard at all  Food Insecurity: No Food Insecurity (07/25/2024)   Epic    Worried About Radiation Protection Practitioner of Food in the Last Year: Never true    Ran Out of Food in the Last Year: Never true  Transportation Needs: No Transportation Needs (07/25/2024)   Epic    Lack of Transportation (Medical): No    Lack of Transportation (Non-Medical): No  Physical Activity: Inactive (07/25/2024)   Exercise Vital Sign    Days of Exercise per Week: 0 days  Minutes of Exercise per Session: Not on file  Stress: No Stress Concern Present (07/25/2024)   [employer] of Occupational Health - Occupational Stress Questionnaire    Feeling of Stress: Not at all  Social Connections: Socially Integrated (07/25/2024)   Social Connection and Isolation Panel    Frequency of Communication with Friends and Family: More than three times a week    Frequency of Social Gatherings with Friends and Family: Once a week    Attends Religious Services: More than 4 times per year    Active Member of [employer] or Organizations: Yes    Attends Engineer, Structural: More than 4 times per year    Marital Status: Married  Catering Manager Violence: Not At Risk (05/26/2024)   Humiliation, Afraid, Rape, and Kick questionnaire    Fear of Current or Ex-Partner: No    Emotionally Abused: No     Physically Abused: No    Sexually Abused: No  Depression (PHQ2-9): Low Risk (09/23/2024)   Depression (PHQ2-9)    PHQ-2 Score: 0  Alcohol Screen: Low Risk (05/26/2024)   Alcohol Screen    Last Alcohol Screening Score (AUDIT): 0  Housing: Low Risk (07/25/2024)   Epic    Unable to Pay for Housing in the Last Year: No    Number of Times Moved in the Last Year: 0    Homeless in the Last Year: No  Utilities: Not At Risk (05/26/2024)   A[employer] Utilities    Threatened with loss of utilities: No  Health Literacy: Adequate Health Literacy (05/26/2024)   B1300 Health Literacy    Frequency of need for help with medical instructions: Never    Outpatient Medications Prior to Visit  Medication Sig Dispense Refill   albuterol  (VENTOLIN  HFA) 108 (90 Base) MCG/ACT inhaler Inhale 2 puffs into the lungs every 6 (six) hours as needed for wheezing or shortness of breath. 8 g 11   allopurinol  (ZYLOPRIM ) 300 MG tablet TAKE 1 TABLET BY MOUTH DAILY 100 tablet 2   amitriptyline  (ELAVIL ) 50 MG tablet 1 - 2 tabs by mouth at bedtime for nerve pain 60 tablet 5   aspirin  EC 81 MG tablet Take 1 tablet (81 mg total) by mouth daily. Swallow whole.     atenolol  (TENORMIN ) 25 MG tablet Take 1 tablet (25 mg total) by mouth daily. 90 tablet 1   Blood Glucose Calibration (ACCU-CHEK GUIDE CONTROL) LIQD Use as directed once daily as needed E11.9 1 each 1   Blood Glucose Monitoring Suppl (ACCU-CHEK GUIDE ME) w/Device KIT Use as directed up to four times per day E11.9 1 kit 0   Continuous Glucose Receiver (FREESTYLE LIBRE 3 READER) DEVI Use as directed four times per day E11.9 1 each 0   Continuous Glucose Sensor (FREESTYLE LIBRE 3 PLUS SENSOR) MISC Change sensor every 15 days. E11.9 6 each 3   dapagliflozin  propanediol (FARXIGA ) 10 MG TABS tablet Take 1 tablet (10 mg total) by mouth daily before breakfast. 90 tablet 3   diclofenac  sodium (VOLTAREN ) 1 % GEL Apply 2 g topically 4 (four) times daily as needed. 200 g 5   dicyclomine   (BENTYL ) 20 MG tablet TAKE 1 TABLET BY MOUTH 3 TIMES  DAILY AS NEEDED 300 tablet 3   furosemide  (LASIX ) 20 MG tablet Take 1 tablet (20 mg total) by mouth daily. 90 tablet 1   gabapentin  (NEURONTIN ) 300 MG capsule TAKE 1 CAPSULE BY MOUTH TWICE  DAILY 200 capsule 1   glipiZIDE  (GLUCOTROL  XL) 10 MG 24  hr tablet TAKE 1 TABLET BY MOUTH DAILY  WITH BREAKFAST 100 tablet 3   ketoconazole (NIZORAL) 2 % shampoo Apply 1 Application topically 3 (three) times a week.     Lancets (ONETOUCH DELICA PLUS LANCET30G) MISC USE AS DIRECTED ONCE DAILY 100 each 2   levothyroxine  (SYNTHROID ) 50 MCG tablet TAKE 1 TABLET BY MOUTH DAILY 100 tablet 3   lisinopril  (ZESTRIL ) 20 MG tablet TAKE 1 TABLET BY MOUTH DAILY 100 tablet 2   metFORMIN  (GLUCOPHAGE -XR) 500 MG 24 hr tablet TAKE 2 TABLETS BY MOUTH TWICE  DAILY WITH MEALS 400 tablet 2   oxyCODONE  (ROXICODONE ) 5 MG immediate release tablet Take 1 tablet (5 mg total) by mouth every 4 (four) hours as needed for severe pain (pain score 7-10) or breakthrough pain. 30 tablet 0   potassium chloride  SA (KLOR-CON  M) 20 MEQ tablet TAKE 2 TABLETS BY MOUTH DAILY 200 tablet 2   rosuvastatin  (CRESTOR ) 40 MG tablet TAKE 1 TABLET BY MOUTH DAILY 100 tablet 2   Semaglutide , 1 MG/DOSE, 4 MG/3ML SOPN Inject 1 mg as directed once a week. 9 mL 3   solifenacin  (VESICARE ) 5 MG tablet Take 1 tablet by mouth once daily 90 tablet 0   venlafaxine  XR (EFFEXOR -XR) 75 MG 24 hr capsule TAKE 1 CAPSULE BY MOUTH DAILY 100 capsule 2   No facility-administered medications prior to visit.    Allergies[1]  ROS     Objective:    Physical Exam   There were no vitals taken for this visit. Wt Readings from Last 3 Encounters:  12/21/24 268 lb 12.8 oz (121.9 kg)  11/24/24 268 lb 1.3 oz (121.6 kg)  11/12/24 272 lb (123.4 kg)       Assessment & Plan:   Problem List Items Addressed This Visit   None   I am having Leon Nelson maintain his diclofenac  sodium, ketoconazole, dicyclomine , aspirin  EC,  albuterol , FreeStyle Libre 3 Plus Sensor, [employer] 3 Reader, Accu-Chek Guide Control, Accu-Chek Guide Me, Semaglutide  (1 MG/DOSE), dapagliflozin  propanediol, glipiZIDE , gabapentin , levothyroxine , potassium chloride  SA, amitriptyline , oxyCODONE , solifenacin , OneTouch Delica Plus Lancet30G, venlafaxine  XR, rosuvastatin , lisinopril , metFORMIN , allopurinol , atenolol , and furosemide .  No orders of the defined types were placed in this encounter.     [1]  Allergies Allergen Reactions   Penicillins Other (See Comments)    Blisters on hands   Sulfonamide Derivatives Other (See Comments)    Blisters in groin area   "

## 2024-12-25 ENCOUNTER — Ambulatory Visit: Admitting: Student

## 2024-12-25 ENCOUNTER — Encounter: Payer: Self-pay | Admitting: Student

## 2024-12-25 VITALS — BP 104/67 | HR 62 | Temp 99.0°F | Resp 16 | Ht 72.0 in | Wt 268.8 lb

## 2024-12-25 DIAGNOSIS — I7121 Aneurysm of the ascending aorta, without rupture: Secondary | ICD-10-CM

## 2024-12-25 DIAGNOSIS — I2583 Coronary atherosclerosis due to lipid rich plaque: Secondary | ICD-10-CM

## 2024-12-25 DIAGNOSIS — F325 Major depressive disorder, single episode, in full remission: Secondary | ICD-10-CM

## 2024-12-25 DIAGNOSIS — Z23 Encounter for immunization: Secondary | ICD-10-CM

## 2024-12-25 DIAGNOSIS — E1149 Type 2 diabetes mellitus with other diabetic neurological complication: Secondary | ICD-10-CM

## 2024-12-25 DIAGNOSIS — M1A9XX0 Chronic gout, unspecified, without tophus (tophi): Secondary | ICD-10-CM

## 2024-12-25 DIAGNOSIS — G629 Polyneuropathy, unspecified: Secondary | ICD-10-CM

## 2024-12-25 DIAGNOSIS — E559 Vitamin D deficiency, unspecified: Secondary | ICD-10-CM

## 2024-12-25 DIAGNOSIS — E039 Hypothyroidism, unspecified: Secondary | ICD-10-CM

## 2024-12-25 DIAGNOSIS — Z7689 Persons encountering health services in other specified circumstances: Secondary | ICD-10-CM

## 2024-12-25 DIAGNOSIS — I2510 Atherosclerotic heart disease of native coronary artery without angina pectoris: Secondary | ICD-10-CM

## 2024-12-25 DIAGNOSIS — I10 Essential (primary) hypertension: Secondary | ICD-10-CM

## 2024-12-25 DIAGNOSIS — E539 Vitamin B deficiency, unspecified: Secondary | ICD-10-CM

## 2024-12-25 DIAGNOSIS — Z125 Encounter for screening for malignant neoplasm of prostate: Secondary | ICD-10-CM

## 2024-12-25 DIAGNOSIS — K760 Fatty (change of) liver, not elsewhere classified: Secondary | ICD-10-CM

## 2024-12-25 LAB — COMPREHENSIVE METABOLIC PANEL WITH GFR
ALT: 24 U/L (ref 3–53)
AST: 19 U/L (ref 5–37)
Albumin: 4.2 g/dL (ref 3.5–5.2)
Alkaline Phosphatase: 64 U/L (ref 39–117)
BUN: 28 mg/dL — ABNORMAL HIGH (ref 6–23)
CO2: 32 meq/L (ref 19–32)
Calcium: 9.8 mg/dL (ref 8.4–10.5)
Chloride: 99 meq/L (ref 96–112)
Creatinine, Ser: 1.1 mg/dL (ref 0.40–1.50)
GFR: 68.24 mL/min
Glucose, Bld: 137 mg/dL — ABNORMAL HIGH (ref 70–99)
Potassium: 3.6 meq/L (ref 3.5–5.1)
Sodium: 140 meq/L (ref 135–145)
Total Bilirubin: 0.5 mg/dL (ref 0.2–1.2)
Total Protein: 6.8 g/dL (ref 6.0–8.3)

## 2024-12-25 LAB — CBC
HCT: 43.9 % (ref 39.0–52.0)
Hemoglobin: 14.6 g/dL (ref 13.0–17.0)
MCHC: 33.2 g/dL (ref 30.0–36.0)
MCV: 85.8 fl (ref 78.0–100.0)
Platelets: 223 K/uL (ref 150.0–400.0)
RBC: 5.12 Mil/uL (ref 4.22–5.81)
RDW: 13.9 % (ref 11.5–15.5)
WBC: 8.5 K/uL (ref 4.0–10.5)

## 2024-12-25 LAB — HEPATIC FUNCTION PANEL
ALT: 24 U/L (ref 3–53)
AST: 19 U/L (ref 5–37)
Albumin: 4.2 g/dL (ref 3.5–5.2)
Alkaline Phosphatase: 64 U/L (ref 39–117)
Bilirubin, Direct: 0.1 mg/dL (ref 0.1–0.3)
Total Bilirubin: 0.5 mg/dL (ref 0.2–1.2)
Total Protein: 6.8 g/dL (ref 6.0–8.3)

## 2024-12-25 LAB — TSH: TSH: 2.55 u[IU]/mL (ref 0.35–5.50)

## 2024-12-25 LAB — URIC ACID: Uric Acid, Serum: 3.3 mg/dL — ABNORMAL LOW (ref 4.0–7.8)

## 2024-12-25 LAB — VITAMIN D 25 HYDROXY (VIT D DEFICIENCY, FRACTURES): VITD: 56.94 ng/mL (ref 30.00–100.00)

## 2024-12-25 LAB — HEMOGLOBIN A1C: Hgb A1c MFr Bld: 7.2 % — ABNORMAL HIGH (ref 4.6–6.5)

## 2024-12-25 LAB — MICROALBUMIN / CREATININE URINE RATIO
Creatinine,U: 83.6 mg/dL
Microalb Creat Ratio: 72.6 mg/g — ABNORMAL HIGH (ref 0.0–30.0)
Microalb, Ur: 6.1 mg/dL — ABNORMAL HIGH (ref 0.7–1.9)

## 2024-12-25 LAB — PSA: PSA: 0.1 ng/mL (ref 0.10–4.00)

## 2024-12-25 LAB — VITAMIN B12: Vitamin B-12: 425 pg/mL (ref 211–911)

## 2024-12-25 MED ORDER — VITAMIN D3 25 MCG (1000 UT) PO CAPS: 1000.0000 [IU] | ORAL_CAPSULE | Freq: Every day | ORAL | Status: AC

## 2024-12-25 MED ORDER — VITAMIN B-12 1000 MCG PO TABS: 1000.0000 ug | ORAL_TABLET | Freq: Every day | ORAL | Status: AC

## 2024-12-25 NOTE — Assessment & Plan Note (Signed)
"  Chronic, stable   "

## 2024-12-25 NOTE — Assessment & Plan Note (Signed)
 Well controlled, no changes to meds. Encouraged heart healthy diet such as the DASH diet and exercise as tolerated.

## 2024-12-25 NOTE — Assessment & Plan Note (Signed)
"   Following with Cardiology      "

## 2024-12-25 NOTE — Assessment & Plan Note (Signed)
"  Stable on Effexor          "

## 2024-12-25 NOTE — Assessment & Plan Note (Signed)
 Supplement and monitor.  Last vitamin D  Lab Results  Component Value Date   VD25OH 72.07 07/29/2024

## 2024-12-25 NOTE — Assessment & Plan Note (Signed)
 Update B12, stable on 1000 mcg daily. Supplement and monitor.

## 2024-12-25 NOTE — Assessment & Plan Note (Signed)
 Following with cardiology. Stable, asymptomatic.

## 2024-12-25 NOTE — Assessment & Plan Note (Signed)
 Noted on recent imaging, for low fat low chol diet, and wt loss

## 2024-12-25 NOTE — Assessment & Plan Note (Signed)
 Stable on allopurinol . Update Uric acid. Pt denies recent flares.

## 2024-12-25 NOTE — Assessment & Plan Note (Signed)
 Monitor TSH. Stable on levothyroxine .

## 2024-12-25 NOTE — Patient Instructions (Signed)
 Leon Nelson

## 2024-12-28 ENCOUNTER — Ambulatory Visit: Payer: Self-pay

## 2024-12-28 ENCOUNTER — Ambulatory Visit: Payer: Self-pay | Admitting: Student

## 2024-12-28 ENCOUNTER — Ambulatory Visit: Payer: Self-pay | Admitting: Rehabilitative and Restorative Service Providers"

## 2024-12-29 ENCOUNTER — Encounter: Payer: Self-pay | Admitting: Rehabilitative and Restorative Service Providers"

## 2024-12-29 ENCOUNTER — Ambulatory Visit: Payer: Self-pay | Attending: Orthopaedic Surgery | Admitting: Rehabilitative and Restorative Service Providers"

## 2024-12-29 DIAGNOSIS — M6281 Muscle weakness (generalized): Secondary | ICD-10-CM | POA: Insufficient documentation

## 2024-12-29 DIAGNOSIS — M25511 Pain in right shoulder: Secondary | ICD-10-CM | POA: Insufficient documentation

## 2024-12-29 DIAGNOSIS — R29898 Other symptoms and signs involving the musculoskeletal system: Secondary | ICD-10-CM | POA: Insufficient documentation

## 2024-12-29 NOTE — Therapy (Signed)
 " OUTPATIENT PHYSICAL THERAPY SHOULDER TREATMENT  Patient Name: Leon Nelson MRN: [id number] DOB:31-May-1956, 69 y.o., male Today's Date: 12/29/2024  END OF SESSION:  PT End of Session - 12/29/24 1407     Visit Number 6    Number of Visits 24    Date for Recertification  02/22/25    Authorization Type [employer] medicare auth required $20 copay    Authorization Time Period 11/30/24 - 02/22/25    Authorization - Visit Number 6    Authorization - Number of Visits 24    Progress Note Due on Visit 10    PT Start Time 1407    PT Stop Time 1445    PT Time Calculation (min) 38 min    Activity Tolerance Patient tolerated treatment well         Past Medical History:  Diagnosis Date   ABDOMINAL PAIN OTHER SPECIFIED SITE 07/20/2008   Arthritis Currently   CHEST PAIN 02/06/2011   COLONIC POLYPS, HX OF 05/26/2008   DEPRESSION 05/26/2008   DIABETES MELLITUS, TYPE II 05/26/2008   HYPERLIPIDEMIA 05/26/2008   HYPERTENSION 05/26/2008   HYPOTHYROIDISM 05/26/2008   KNEE PAIN, RIGHT, ACUTE 07/01/2008   Personal history of urinary calculi 05/26/2008   Pure hypercholesterolemia 05/19/2024   UTI'S, HX OF 05/26/2008   Past Surgical History:  Procedure Laterality Date   ANKLE SURGERY Left    JOINT REPLACEMENT  09/2009   Right Knee   REVERSE SHOULDER ARTHROPLASTY Right 11/24/2024   Procedure: ARTHROPLASTY, SHOULDER, TOTAL, REVERSE;  Surgeon: Genelle Standing, MD;  Location: [facility];  Service: Orthopedics;  Laterality: Right;   s/p right knee arthroplasty      complicated by patellar tendon rupture Jan 2011 Dr. heide   TONSILLECTOMY     Patient Active Problem List   Diagnosis Date Noted   Chronic gout without tophus 12/25/2024   Hypothyroidism 12/25/2024   Traumatic complete tear of right rotator cuff 11/24/2024   Fatty liver 07/29/2024   Joint pain 05/19/2024   Low back pain 05/19/2024   Thrombosed external hemorrhoids 05/19/2024   Work stress 04/09/2024   Coronary artery  disease due to lipid rich plaque 04/09/2024   DOE (dyspnea on exertion) 04/07/2024   Ascending aortic aneurysm 03/17/2024   Neuropathy 02/02/2024   Changes in vision 09/14/2023   Urinary frequency 01/23/2023   Constipation 01/23/2023   RUQ pain 04/24/2021   Vitamin D  deficiency 04/24/2021   Vitamin B deficiency 04/24/2021   Balance disorder 06/22/2020   Dizziness 06/22/2020   Irritable bowel syndrome, unspecified 01/15/2019   Rhabdomyolysis 01/08/2019   Left cervical radiculopathy 01/27/2018   Left hand paresthesia 01/27/2018   Hypokalemia 03/27/2017   Heart murmur previously undiagnosed 08/14/2011   Diabetes mellitus type 2 with neurological manifestations (HCC) 05/26/2008   Essential hypertension 05/26/2008   History of colonic polyps 05/26/2008   Depression, major, single episode, complete remission 05/26/2008   PCP: Dr [name] REFERRING PROVIDER: Dr [name] REFERRING DIAG: R Reverse TSA THERAPY DIAG:  Acute pain of right shoulder  Other symptoms and signs involving the musculoskeletal system  Muscle weakness (generalized)  Rationale for Evaluation and Treatment: Rehabilitation  ONSET DATE: 11/24/24  SUBJECTIVE:  SUBJECTIVE STATEMENT: The patient reports that his shoulder is feeling better since he has been taking the pain medication on a regular basis morning and bedtime. Still having trouble sleeping. Can't lie on his back. Sleeps propped on pillows and continues to use sling at night. He is able to do his exercises now. Working on scar massage and can tell that the scar os moving better and looking better.   EVAL: Patient reports that he had a total tear of the rotator cuff in his R shoulder. Patient reports that he fell 07/27/24, landing body weight on the R shoulder. He had pain and  was unable to lift R UE or use arm. Diagnostic tests revealed torn rotator cuff. Patient underwent reverse total shoulder replacement 11/24/24. Post op course has been uncomplicated. He has continued pain at 7-8/10 level. He is sleeping in the recliner. Pain free R shoulder at conclusion of treatment. Hand dominance: Right  PERTINENT HISTORY: Type II diabetes; hyperlipidemia; HTN; hypothyroidism: R knee pain; abdominal pain; depression; R TKA; ankle stabilization surgery 2020  PAIN:  Are you having pain? Yes: NPRS scale: 0/10 today, intermittent shooting pain up to 2/10  Pain location: R shoulder Pain description: shooting; sometimes sharp  Aggravating factors: movement  Relieving factors: advil ; tylonol;   PRECAUTIONS: Shoulder R reverse TSA per protocol   WEIGHT BEARING RESTRICTIONS: Yes   FALLS:  Has patient fallen in last 6 months? Yes. 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  DIAGNOSTIC FINDINGS:  CT R shoulder 09/30/24: 1. Moderate glenohumeral joint osteoarthritis. 2. Severe acromioclavicular joint osteoarthritis. 3. Mild atrophy of the subscapularis and infraspinatus muscles.  PATIENT SURVEYS: (survey based on patient's subjective report of symptoms and activity level)  Quick DASH - 88.6/100; 88.6%      SENSATION: WFL  POSTURE: Head forward; shoulders rounded and elevated; increased thoracic kyphosis  UPPER EXTREMITY ROM: R hand, wrist, forearm, elbow WFL's    R shoulder ROM not assessed  today    L UE WFL's     12/14/24: PROM R shoulder previously assessed in sitting  Passive ROM Right 12/02/24 Right  12/08/24  Right  12/14/24 Supine   Shoulder flexion 75 deg 95 141  Shoulder extension     Shoulder abduction (scaption) 40 80 80  Shoulder adduction     Shoulder internal rotation     Shoulder external rotation  (in scapular plane)  20  30 32  Elbow flexion     Elbow extension     Wrist flexion     Wrist extension     Wrist ulnar deviation     Wrist radial deviation     Wrist pronation     Wrist supination     (Blank rows = not tested)  UPPER EXTREMITY MMT: not assessed  MMT Right eval Left eval  Shoulder flexion    Shoulder extension    Shoulder abduction    Shoulder adduction    Shoulder internal rotation    Shoulder external rotation    Middle trapezius  Lower trapezius    Elbow flexion    Elbow extension    Wrist flexion    Wrist extension    Wrist ulnar deviation    Wrist radial deviation    Wrist pronation    Wrist supination    Grip strength (lbs)    (Blank rows = not tested)  PALPATION:   Muscular tightness upper trap   [facility] Adult PT Treatment:                                                DATE: 12/29/24 Therapeutic Exercise: Pendulum flexion; horizontal ab/adduction; circles CW/CCW x 15 each  Manual Therapy: (patient supine) STM R upper trap; forearm area  PROM R shoulder flexion; scaption; ER in scapular plane  Scar massage  Neuromuscular re-ed: Chin tuck 5 sec x 10  Scap squeeze 5 sec x 10  Isometric R shoulder flexion; abduction; extension without resistance to activate all heads of the deltoid sitting scap squeeze 5 sec x 10 ea Standing bent forward shoulder extension to neutral 2-3 sec x 5  Therapeutic Activity: Shoulder flexion step back 10 sec x 5  Table slide shoulder flexion 5 sec x 3 (patient does in standing at counter at home to achieve proper height) Supine - assisting with L hand for AAROM R shoulder flexion  keeping scapulae down and back (no popping or pain reported) Modalities: To ice at home  Self Care: Instructed in sleeping position use of sling for sleeping  Continue with sling for comfort and UE support - discussed weaning from sling at home but continued use at night  Can remove sling with R UE supported at side on pillow    [facility] Adult PT Treatment:                                                DATE: 12/22/24 Therapeutic Exercise: Pendulum flexion, CW and CCW x 15 each Supine AAROM shoulder flexion Supine Attempted cane press AAROM--  discontinued due to pain/discomfort Manual Therapy: PROM R shoulder flexion; scaption; ER in scapular plane Gentle rocking to relax musculature before PROM Therapeutic Activity: Standing AAROM with arm on physioball staying in pain free range Seated AAROM with arm supported on table flexion x 10 reps Gentle isometrics-- discontinued R shoulder flexion and abduction today due to mild pain-- continued with shoulder extension isometrics with arm at neutral supported by towel in scapular plane x 5 reps (reduces pain) Modalities: To ice at home   [facility] Adult PT Treatment:                                                DATE: 12/14/24 Therapeutic Exercise: Pendulum flexion; horizontal ab/adduction; circles CW/CCW x 15 each  Manual Therapy: (patient supine) STM R upper trap; forearm area  PROM R shoulder flexion; scaption; ER in scapular plane  Scar massage  Neuromuscular re-ed: Chin tuck 5 sec x 10  Scap squeeze 5 sec x 10  Isometric R shoulder flexion; abduction; extension without resistance to activate all heads of the deltoid sitting scap squeeze 2 sec x 10  ea Therapeutic Activity: Shoulder flexion step back 5 sec x 5  Table slide shoulder flexion 5 sec x 3 (patient does in standing at counter at home to achieve proper height) Supine - assisting with L hand for AAROM R shoulder flexion keeping scapulae down and back (no popping or pain  reported) Modalities: To ice at home  Self Care: Instructed in sleeping position and exercises supine for home using bolster and pillows trial in clinic - took photo for patient on his phone Reviewed sling position  Continue with sling for comfort and UE support - discussed weaning fro sling at home  Can remove sling with R UE supported at side on pillow    [facility] Adult PT Treatment:                                                DATE: 12/08/24 Therapeutic Exercise: AROM R hand, wrist, forearm, elbow Pendulum flexion; horizontal ab/adduction; circles CW/CCW x 15 each  Manual Therapy: (patient sitting) STM R upper trap; forearm area  PROM R shoulder elevation; ER in scapular plane  Neuromuscular re-ed: Chin tuck 5 sec x 10  Scap squeeze 5 sec x 10  Therapeutic Activity: Shoulder flexion step back 5 sec x 5  Table slide shoulder flexion 5 sec x 1 ER with cane elbow supported 5 sec x 5  Modalities: To ice at home  Self Care: Adjusted sling for sleep position Continue with sling for comfort and UE support - weaning Can remove sling with R UE supported at side on pillow    PATIENT EDUCATION: Education details: POC;HEP  Person educated: Patient Education method: Explanation, Demonstration, Tactile cues, Verbal cues, and Handouts Education comprehension: verbalized understanding, returned demonstration, verbal cues required, tactile cues required, and needs further education  HOME EXERCISE PROGRAM: Access Code: [email or web address] URL: [email or web address] Date: 12/29/2024 Prepared by: Moo Gravley  Program Notes -sitting with chest up and shoulder down and back -pretend to push arm forward, out to side, and backward without moving arm-3 seconds x 10 -2 x day   Exercises - Seated Scapular Retraction  - 2 x daily - 7 x weekly - 1-2 sets - 10 reps - 10 sec  hold - Seated Cervical Retraction  - 2 x daily - 7 x weekly - 1-2 sets - 5-10 reps - 10 sec  hold - Flexion-Extension  Shoulder Pendulum with Table Support  - 3-4 x daily - 7 x weekly - 1 sets - 20-30 reps - Horizontal Shoulder Pendulum with Table Support  - 3-4 x daily - 7 x weekly - 1 sets - 20-30 reps - Circular Shoulder Pendulum with Table Support  - 3-4 x daily - 7 x weekly - 1 sets - 20-30 reps - Standing 'L' Stretch at Counter  - 2 x daily - 7 x weekly - 1 sets - 5-10 reps - 5 sec  hold - Seated Shoulder Flexion Towel Slide at Table Top Full Range of Motion  - 2 x daily - 7 x weekly - 1 sets - 5-10 reps - 10sec  hold - Seated Shoulder External Rotation AAROM with Dowel  - 2 x daily - 7 x weekly - 1 sets - 5-10 reps - 5 sec  hold - Supine Shoulder Flexion AAROM with Hands Clasped  - 2 x daily - 7 x weekly - 1 sets - 5-10  reps - 2-3 sec  hold - Standing Forward-Bent Shoulder Extension  - 1 x daily - 7 x weekly - 1 sets - 5-10 reps - 3 sec  hold  Patient Education - Scar Massage  ASSESSMENT:  CLINICAL IMPRESSION: Patient is 5 weeks post reverse TSA. He has taken some of the pain medication and has had less pain and tolerated HEP much better. And encouraged patient to use pain meds on a regular basis. Patient tolerated all exercises and added bent forward shoulder extension to neutral per protocol. No pain with exercises or PROM.   Eval: Patient is a 69 y.o. male who was seen today for physical therapy evaluation and treatment s/p R TSA 11/24/24 following a fall with injury 07/27/24. He has been out of the sling since ~ the second post op day per MD instructions. Discussed supporting R UE when out of the sling to allow musculature of R shoulder girdle to rest and heal without need to support the weight of the UE. Patient presents with rounded posture and alignent through the R shoulder girdle and thoracic spine; limited ROM, strength, function of R UE following reverse TSA. Of concern, he continues to report pain at 7-8/10 on a daily basis. Patient has decreased functional activities and ADL's. Darick will benefit  from PT to address deficits and progress with shoulder rehab.   OBJECTIVE IMPAIRMENTS: decreased activity tolerance, decreased mobility, decreased ROM, decreased strength, increased muscle spasms, impaired UE functional use, improper body mechanics, postural dysfunction, and pain.   GOALS: Goals reviewed with patient? Yes  SHORT TERM GOALS: Target date: 01/11/2025  Independent in initial HEP  Baseline: Goal status: INITIAL  2.  Improve posture and alignment through shoulder girdle with patient demonstrating activation of posterior shoulder girdle musculature to improve shoulder function  Baseline:  Goal status: INITIAL  3.  P/AAROM R shoulder to 130 deg elevation and 30 deg ER in scapular plane Baseline:  Goal status: INITIAL  LONG TERM GOALS: Target date: 02/22/2025  Progress shoulder rehab per protocol or as directed by MD  Baseline:  Goal status: INITIAL  2.  PROM 145 deg elevation and 40 deg ER in scapular plane; functional IR to L! Baseline:  Goal status: INITIAL  3.  Decrease pain to 0/10 to no more than 3/10  Baseline:  Goal status: INITIAL  4.  Return to functional activities using R UE including return to work at desk/computer/phone  Baseline:  Goal status: INITIAL  5.  Improve Quick DASH score by 10% indicating improved functional activities  Baseline:  Goal status: INITIAL  6.  Independent in advanced HEP  Baseline:  Goal status: INITIAL PLAN:  PT FREQUENCY: 2x/week  PT DURATION: 12 weeks  PLANNED INTERVENTIONS: 97164- PT Re-evaluation, 97110-Therapeutic exercises, 97530- Therapeutic activity, 97112- Neuromuscular re-education, 97535- Self Care, [id number]- Manual therapy, [phone]- Aquatic Therapy, Patient/Family education, and Taping  PLAN FOR NEXT SESSION: review and progress exercise per protocol; manual work and modalities as indicated; progress rehab per protocol and/or as ordered  - trial of isometric standing for activation of deltoid per protocol; ER  with cane for AAROM; continued with exercises per protocol    Man Bonneau SHAUNNA Baptist, PT 12/29/2024, 2:09 PM  "

## 2024-12-31 ENCOUNTER — Other Ambulatory Visit: Payer: Self-pay | Admitting: Student

## 2024-12-31 ENCOUNTER — Ambulatory Visit: Payer: Self-pay | Admitting: Rehabilitative and Restorative Service Providers"

## 2024-12-31 ENCOUNTER — Encounter: Payer: Self-pay | Admitting: Rehabilitative and Restorative Service Providers"

## 2024-12-31 DIAGNOSIS — M6281 Muscle weakness (generalized): Secondary | ICD-10-CM

## 2024-12-31 DIAGNOSIS — M25511 Pain in right shoulder: Secondary | ICD-10-CM

## 2024-12-31 DIAGNOSIS — R29898 Other symptoms and signs involving the musculoskeletal system: Secondary | ICD-10-CM

## 2024-12-31 MED ORDER — ACCU-CHEK GUIDE CONTROL VI LIQD: 1 refills | Status: AC

## 2024-12-31 MED ORDER — ACCU-CHEK GUIDE ME W/DEVICE KIT: PACK | 0 refills | Status: AC

## 2024-12-31 MED ORDER — GLIPIZIDE ER 10 MG PO TB24: 10.0000 mg | ORAL_TABLET | Freq: Every day | ORAL | 1 refills | Status: AC

## 2024-12-31 MED ORDER — GABAPENTIN 300 MG PO CAPS: 300.0000 mg | ORAL_CAPSULE | Freq: Two times a day (BID) | ORAL | 1 refills | Status: AC

## 2024-12-31 NOTE — Therapy (Signed)
 " OUTPATIENT PHYSICAL THERAPY SHOULDER TREATMENT  Patient Name: Leon Nelson MRN: [id number] DOB:[birth year], 69 y.o., male Today's Date: 12/31/2024  END OF SESSION:  Leon Nelson End of Session - 12/31/24 1403     Visit Number 7    Number of Visits 24    Date for Recertification  02/22/25    Authorization Type [employer] medicare auth required $20 copay    Authorization Time Period 12/29/24-03/29/25    Authorization - Visit Number 7    Authorization - Number of Visits 22   6 + additional 16 12/29/24-03/29/25   Progress Note Due on Visit 10    Leon Nelson Start Time 1405    Leon Nelson Stop Time 1445    Leon Nelson Time Calculation (min) 40 min         Past Medical History:  Diagnosis Date   ABDOMINAL PAIN OTHER SPECIFIED SITE 07/20/2008   Arthritis Currently   CHEST PAIN 02/06/2011   COLONIC POLYPS, HX OF 05/26/2008   DEPRESSION 05/26/2008   DIABETES MELLITUS, TYPE II 05/26/2008   HYPERLIPIDEMIA 05/26/2008   HYPERTENSION 05/26/2008   HYPOTHYROIDISM 05/26/2008   KNEE PAIN, RIGHT, ACUTE 07/01/2008   Personal history of urinary calculi 05/26/2008   Pure hypercholesterolemia 05/19/2024   UTI'S, HX OF 05/26/2008   Past Surgical History:  Procedure Laterality Date   ANKLE SURGERY Left    JOINT REPLACEMENT  09/2009   Right Knee   REVERSE SHOULDER ARTHROPLASTY Right 11/24/2024   Procedure: ARTHROPLASTY, SHOULDER, TOTAL, REVERSE;  Surgeon: Genelle Standing, MD;  Location: [facility];  Service: Orthopedics;  Laterality: Right;   s/p right knee arthroplasty      complicated by patellar tendon rupture Jan 2011 Dr. heide   TONSILLECTOMY     Patient Active Problem List   Diagnosis Date Noted   Chronic gout without tophus 12/25/2024   Hypothyroidism 12/25/2024   Traumatic complete tear of right rotator cuff 11/24/2024   Fatty liver 07/29/2024   Joint pain 05/19/2024   Low back pain 05/19/2024   Thrombosed external hemorrhoids 05/19/2024   Work stress 04/09/2024   Coronary artery disease due to lipid rich  plaque 04/09/2024   DOE (dyspnea on exertion) 04/07/2024   Ascending aortic aneurysm 03/17/2024   Neuropathy 02/02/2024   Changes in vision 09/14/2023   Urinary frequency 01/23/2023   Constipation 01/23/2023   RUQ pain 04/24/2021   Vitamin D  deficiency 04/24/2021   Vitamin B deficiency 04/24/2021   Balance disorder 06/22/2020   Dizziness 06/22/2020   Irritable bowel syndrome, unspecified 01/15/2019   Rhabdomyolysis 01/08/2019   Left cervical radiculopathy 01/27/2018   Left hand paresthesia 01/27/2018   Hypokalemia 03/27/2017   Heart murmur previously undiagnosed 08/14/2011   Diabetes mellitus type 2 with neurological manifestations (HCC) 05/26/2008   Essential hypertension 05/26/2008   History of colonic polyps 05/26/2008   Depression, major, single episode, complete remission 05/26/2008   PCP: Dr [name] REFERRING PROVIDER: Dr [name] REFERRING DIAG: R Reverse TSA THERAPY DIAG:  Acute pain of right shoulder  Other symptoms and signs involving the musculoskeletal system  Muscle weakness (generalized)  Rationale for Evaluation and Treatment: Rehabilitation  ONSET DATE: 11/24/24  SUBJECTIVE:  SUBJECTIVE STATEMENT: The patient reports that his shoulder is hurting today. He awoke with his R arm over his chest toward the L side. He was in his sling but the arm and sling were across his chest. He has had increased pain since that time. Prior to this morning he was doing great - doing his exercises and having no pain. Pain free at the end of the treatment.  EVAL: Patient reports that he had a total tear of the rotator cuff in his R shoulder. Patient reports that he fell 07/27/24, landing body weight on the R shoulder. He had pain and was unable to lift R UE or use arm. Diagnostic tests revealed  torn rotator cuff. Patient underwent reverse total shoulder replacement 11/24/24. Post op course has been uncomplicated. He has continued pain at 7-8/10 level. He is sleeping in the recliner. Pain free R shoulder at conclusion of treatment. Hand dominance: Right  PERTINENT HISTORY: Type II diabetes; hyperlipidemia; HTN; hypothyroidism: R knee pain; abdominal pain; depression; R TKA; ankle stabilization surgery 2020  PAIN:  Are you having pain? Yes: NPRS scale: 4-5/10 today, intermittent shooting pain up to 2/10  Pain location: R shoulder Pain description: shooting; sometimes sharp  Aggravating factors: movement  Relieving factors: advil ; tylonol;   PRECAUTIONS: Shoulder R reverse TSA per protocol   WEIGHT BEARING RESTRICTIONS: Yes   FALLS:  Has patient fallen in last 6 months? Yes. 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  DIAGNOSTIC FINDINGS:  CT R shoulder 09/30/24: 1. Moderate glenohumeral joint osteoarthritis. 2. Severe acromioclavicular joint osteoarthritis. 3. Mild atrophy of the subscapularis and infraspinatus muscles.  PATIENT SURVEYS: (survey based on patient's subjective report of symptoms and activity level)  Quick DASH - 88.6/100; 88.6%      SENSATION: WFL  POSTURE: Head forward; shoulders rounded and elevated; increased thoracic kyphosis  UPPER EXTREMITY ROM: R hand, wrist, forearm, elbow WFL's    R shoulder ROM not assessed today    L UE WFL's     12/14/24: PROM R shoulder previously  assessed in sitting  Passive ROM Right 12/02/24 Right  12/08/24  Right  12/14/24 Supine   Shoulder flexion 75 deg 95 141  Shoulder extension     Shoulder abduction (scaption) 40 80 80  Shoulder adduction     Shoulder internal rotation     Shoulder external rotation  (in scapular plane)  20  30 32  Elbow flexion     Elbow extension     Wrist flexion     Wrist extension     Wrist ulnar deviation     Wrist radial deviation     Wrist pronation     Wrist supination     (Blank rows = not tested)  UPPER EXTREMITY MMT: not assessed  MMT Right eval Left eval  Shoulder flexion    Shoulder extension    Shoulder abduction    Shoulder adduction    Shoulder internal rotation    Shoulder external rotation    Middle trapezius  Lower trapezius    Elbow flexion    Elbow extension    Wrist flexion    Wrist extension    Wrist ulnar deviation    Wrist radial deviation    Wrist pronation    Wrist supination    Grip strength (lbs)    (Blank rows = not tested)  PALPATION:   Muscular tightness upper trap   [facility] Adult Leon Nelson Treatment:                                                DATE: 12/31/24 Manual Therapy: (patient sitting) STM R upper trap; pecs; periscapular musculature; biceps/anterior shoulder  PROM R shoulder flexion; scaption; ER in scapular plane   Neuromuscular re-ed: Chin tuck 5 sec x 10  Chin tuck with cervical rotation Chin tuck with lateral cervical flexion Scap squeeze 5 sec x 10  Modalities: To ice at home  Self Care: Instructed in sleeping position use of sling for sleeping - may return to sleep in chair until shoulder pain subsides  Continue with sling for comfort and UE support - discussed wearing sling at home to address flare up of shoulder pain  Can remove sling with R UE supported at side on pillow    [facility] Adult Leon Nelson Treatment:                                                DATE: 12/29/24 Therapeutic Exercise: Pendulum flexion; horizontal ab/adduction;  circles CW/CCW x 15 each  Manual Therapy: (patient supine) STM R upper trap; forearm area  PROM R shoulder flexion; scaption; ER in scapular plane  Scar massage  Neuromuscular re-ed: Chin tuck 5 sec x 10  Scap squeeze 5 sec x 10  Isometric R shoulder flexion; abduction; extension without resistance to activate all heads of the deltoid sitting scap squeeze 5 sec x 10 ea Standing bent forward shoulder extension to neutral 2-3 sec x 5  Therapeutic Activity: Shoulder flexion step back 10 sec x 5  Table slide shoulder flexion 5 sec x 3 (patient does in standing at counter at home to achieve proper height) Supine - assisting with L hand for AAROM R shoulder flexion keeping scapulae down and back (no popping or pain reported) Modalities: To ice at home  Self Care: Instructed in sleeping position use of sling for sleeping  Continue with sling for comfort and UE support - discussed weaning from sling at home but continued use at night  Can remove sling with R UE supported at side on pillow    [facility] Adult Leon Nelson Treatment:                                                DATE: 12/22/24 Therapeutic Exercise: Pendulum flexion, CW and CCW x 15 each Supine AAROM shoulder flexion Supine Attempted cane press AAROM--  discontinued due to pain/discomfort Manual Therapy: PROM R shoulder flexion; scaption; ER in scapular plane Gentle rocking to relax musculature before PROM Therapeutic Activity: Standing AAROM with arm on physioball staying in pain free range Seated AAROM with arm supported on table flexion  x 10 reps Gentle isometrics-- discontinued R shoulder flexion and abduction today due to mild pain-- continued with shoulder extension isometrics with arm at neutral supported by towel in scapular plane x 5 reps (reduces pain) Modalities: To ice at home   [facility] Adult Leon Nelson Treatment:                                                DATE: 12/14/24 Therapeutic Exercise: Pendulum flexion; horizontal  ab/adduction; circles CW/CCW x 15 each  Manual Therapy: (patient supine) STM R upper trap; forearm area  PROM R shoulder flexion; scaption; ER in scapular plane  Scar massage  Neuromuscular re-ed: Chin tuck 5 sec x 10  Scap squeeze 5 sec x 10  Isometric R shoulder flexion; abduction; extension without resistance to activate all heads of the deltoid sitting scap squeeze 2 sec x 10 ea Therapeutic Activity: Shoulder flexion step back 5 sec x 5  Table slide shoulder flexion 5 sec x 3 (patient does in standing at counter at home to achieve proper height) Supine - assisting with L hand for AAROM R shoulder flexion keeping scapulae down and back (no popping or pain reported) Modalities: To ice at home  Self Care: Instructed in sleeping position and exercises supine for home using bolster and pillows trial in clinic - took photo for patient on his phone Reviewed sling position  Continue with sling for comfort and UE support - discussed weaning fro sling at home  Can remove sling with R UE supported at side on pillow    [facility] Adult Leon Nelson Treatment:                                                DATE: 12/08/24 Therapeutic Exercise: AROM R hand, wrist, forearm, elbow Pendulum flexion; horizontal ab/adduction; circles CW/CCW x 15 each  Manual Therapy: (patient sitting) STM R upper trap; forearm area  PROM R shoulder elevation; ER in scapular plane  Neuromuscular re-ed: Chin tuck 5 sec x 10  Scap squeeze 5 sec x 10  Therapeutic Activity: Shoulder flexion step back 5 sec x 5  Table slide shoulder flexion 5 sec x 1 ER with cane elbow supported 5 sec x 5  Modalities: To ice at home  Self Care: Adjusted sling for sleep position Continue with sling for comfort and UE support - weaning Can remove sling with R UE supported at side on pillow    PATIENT EDUCATION: Education details: POC;HEP  Person educated: Patient Education method: Explanation, Demonstration, Tactile cues, Verbal cues, and  Handouts Education comprehension: verbalized understanding, returned demonstration, verbal cues required, tactile cues required, and needs further education  HOME EXERCISE PROGRAM: Access Code: [email or web address] URL: [email or web address] Date: 12/29/2024 Prepared by: Sade Hollon  Program Notes -sitting with chest up and shoulder down and back -pretend to push arm forward, out to side, and backward without moving arm-3 seconds x 10 -2 x day   Exercises - Seated Scapular Retraction  - 2 x daily - 7 x weekly - 1-2 sets - 10 reps - 10 sec  hold - Seated Cervical Retraction  - 2 x daily - 7 x weekly - 1-2 sets - 5-10 reps - 10 sec  hold - Flexion-Extension Shoulder Pendulum  with Table Support  - 3-4 x daily - 7 x weekly - 1 sets - 20-30 reps - Horizontal Shoulder Pendulum with Table Support  - 3-4 x daily - 7 x weekly - 1 sets - 20-30 reps - Circular Shoulder Pendulum with Table Support  - 3-4 x daily - 7 x weekly - 1 sets - 20-30 reps - Standing 'L' Stretch at Counter  - 2 x daily - 7 x weekly - 1 sets - 5-10 reps - 5 sec  hold - Seated Shoulder Flexion Towel Slide at Table Top Full Range of Motion  - 2 x daily - 7 x weekly - 1 sets - 5-10 reps - 10sec  hold - Seated Shoulder External Rotation AAROM with Dowel  - 2 x daily - 7 x weekly - 1 sets - 5-10 reps - 5 sec  hold - Supine Shoulder Flexion AAROM with Hands Clasped  - 2 x daily - 7 x weekly - 1 sets - 5-10 reps - 2-3 sec  hold - Standing Forward-Bent Shoulder Extension  - 1 x daily - 7 x weekly - 1 sets - 5-10 reps - 3 sec  hold  Patient Education - Scar Massage  ASSESSMENT:  CLINICAL IMPRESSION: Patient is 5 weeks 2 days post reverse TSA. He returns today with significant increased pain in the R shoulder following sleeping in adducted shoulder position. Pain has persisted throughout the day. He presents with rounded shoulder in adducted and flexed posture with rm resting across his body. Treatment address posture and alignment  with gentle movement of scapula and shoulder. Using movement of cervical spine and R UE to improve tissue release R shoulder girdle. Patient tolerated treatment well with report of being pain free at end of treatment.  And encouraged patient to use pain meds on a regular basis, modify posture for sitting; keep UE supported at home.  Eval: Patient is a 69 y.o. male who was seen today for physical therapy evaluation and treatment s/p R TSA 11/24/24 following a fall with injury 07/27/24. He has been out of the sling since ~ the second post op day per MD instructions. Discussed supporting R UE when out of the sling to allow musculature of R shoulder girdle to rest and heal without need to support the weight of the UE. Patient presents with rounded posture and alignent through the R shoulder girdle and thoracic spine; limited ROM, strength, function of R UE following reverse TSA. Of concern, he continues to report pain at 7-8/10 on a daily basis. Patient has decreased functional activities and ADL's. Leon Nelson will benefit from Leon Nelson to address deficits and progress with shoulder rehab.   OBJECTIVE IMPAIRMENTS: decreased activity tolerance, decreased mobility, decreased ROM, decreased strength, increased muscle spasms, impaired UE functional use, improper body mechanics, postural dysfunction, and pain.   GOALS: Goals reviewed with patient? Yes  SHORT TERM GOALS: Target date: 01/11/2025  Independent in initial HEP  Baseline: Goal status: INITIAL  2.  Improve posture and alignment through shoulder girdle with patient demonstrating activation of posterior shoulder girdle musculature to improve shoulder function  Baseline:  Goal status: INITIAL  3.  P/AAROM R shoulder to 130 deg elevation and 30 deg ER in scapular plane Baseline:  Goal status: INITIAL  LONG TERM GOALS: Target date: 02/22/2025  Progress shoulder rehab per protocol or as directed by MD  Baseline:  Goal status: INITIAL  2.  PROM 145 deg  elevation and 40 deg ER in scapular plane; functional IR to  L! Baseline:  Goal status: INITIAL  3.  Decrease pain to 0/10 to no more than 3/10  Baseline:  Goal status: INITIAL  4.  Return to functional activities using R UE including return to work at desk/computer/phone  Baseline:  Goal status: INITIAL  5.  Improve Quick DASH score by 10% indicating improved functional activities  Baseline:  Goal status: INITIAL  6.  Independent in advanced HEP  Baseline:  Goal status: INITIAL PLAN:  Leon Nelson FREQUENCY: 2x/week  Leon Nelson DURATION: 12 weeks  PLANNED INTERVENTIONS: 97164- Leon Nelson Re-evaluation, 97110-Therapeutic exercises, 97530- Therapeutic activity, 97112- Neuromuscular re-education, 97535- Self Care, [id number]- Manual therapy, [phone]- Aquatic Therapy, Patient/Family education, and Taping  PLAN FOR NEXT SESSION: review and progress exercise per protocol; manual work and modalities as indicated; progress rehab per protocol and/or as ordered  - trial of isometric standing for activation of deltoid per protocol; ER with cane for AAROM; continued with exercises per protocol    Leon Nelson, Leon Nelson 12/31/2024, 2:09 PM  "

## 2024-12-31 NOTE — Telephone Encounter (Signed)
 Copied from CRM [id number]. Topic: Clinical - Medication Refill >> Dec 31, 2024 12:09 PM Rea ORN wrote: Medication:  Accu-Check test strips (insurance covers accu-check) Accu-Check lancets (insurance covers accu-check) dicyclomine  (BENTYL ) 20 MG tablet gabapentin  (NEURONTIN ) 300 MG capsule glipiZIDE  (GLUCOTROL  XL) 10 MG 24 hr tablet  Has the patient contacted their pharmacy? No (Agent: If no, request that the patient contact the pharmacy for the refill. If patient does not wish to contact the pharmacy document the reason why and proceed with request.) (Agent: If yes, when and what did the pharmacy advise?)  This is the patient's preferred pharmacy:  [facility] - [place], [place] - [address] [place] [address] Phone: [phone] Fax: [phone]   Is this the correct pharmacy for this prescription? Yes If no, delete pharmacy and type the correct one.   Has the prescription been filled recently? No  Is the patient out of the medication? No  Has the patient been seen for an appointment in the last year OR does the patient have an upcoming appointment? Yes  Can we respond through MyChart? Yes  Agent: Please be advised that Rx refills may take up to 3 business days. We ask that you follow-up with your pharmacy.

## 2025-01-01 MED ORDER — DICYCLOMINE HCL 20 MG PO TABS: 20.0000 mg | ORAL_TABLET | Freq: Three times a day (TID) | ORAL | 0 refills | Status: AC | PRN

## 2025-01-04 ENCOUNTER — Encounter: Payer: Self-pay | Admitting: Rehabilitative and Restorative Service Providers"

## 2025-01-04 ENCOUNTER — Ambulatory Visit: Admitting: Rehabilitative and Restorative Service Providers"

## 2025-01-04 DIAGNOSIS — M25511 Pain in right shoulder: Secondary | ICD-10-CM | POA: Diagnosis not present

## 2025-01-04 DIAGNOSIS — R29898 Other symptoms and signs involving the musculoskeletal system: Secondary | ICD-10-CM

## 2025-01-04 DIAGNOSIS — M6281 Muscle weakness (generalized): Secondary | ICD-10-CM

## 2025-01-04 NOTE — Therapy (Signed)
 " OUTPATIENT PHYSICAL THERAPY SHOULDER TREATMENT  Patient Name: CHRISTIN MCCREEDY MRN: [id number] DOB:12-29-1955, 69 y.o., male Today's Date: 01/04/2025  END OF SESSION:  PT End of Session - 01/04/25 0720     Visit Number 8    Number of Visits 24    Date for Recertification  02/22/25    Authorization Type [employer] medicare auth required $20 copay    Authorization Time Period 12/29/24-03/29/25    Authorization - Visit Number 8    Authorization - Number of Visits 22    Progress Note Due on Visit 10    PT Start Time 0715    PT Stop Time 0800    PT Time Calculation (min) 45 min    Activity Tolerance Patient tolerated treatment well         Past Medical History:  Diagnosis Date   ABDOMINAL PAIN OTHER SPECIFIED SITE 07/20/2008   Arthritis Currently   CHEST PAIN 02/06/2011   COLONIC POLYPS, HX OF 05/26/2008   DEPRESSION 05/26/2008   DIABETES MELLITUS, TYPE II 05/26/2008   HYPERLIPIDEMIA 05/26/2008   HYPERTENSION 05/26/2008   HYPOTHYROIDISM 05/26/2008   KNEE PAIN, RIGHT, ACUTE 07/01/2008   Personal history of urinary calculi 05/26/2008   Pure hypercholesterolemia 05/19/2024   UTI'S, HX OF 05/26/2008   Past Surgical History:  Procedure Laterality Date   ANKLE SURGERY Left    JOINT REPLACEMENT  09/2009   Right Knee   REVERSE SHOULDER ARTHROPLASTY Right 11/24/2024   Procedure: ARTHROPLASTY, SHOULDER, TOTAL, REVERSE;  Surgeon: Genelle Standing, MD;  Location: [facility];  Service: Orthopedics;  Laterality: Right;   s/p right knee arthroplasty      complicated by patellar tendon rupture Jan 2011 Dr. heide   TONSILLECTOMY     Patient Active Problem List   Diagnosis Date Noted   Chronic gout without tophus 12/25/2024   Hypothyroidism 12/25/2024   Traumatic complete tear of right rotator cuff 11/24/2024   Fatty liver 07/29/2024   Joint pain 05/19/2024   Low back pain 05/19/2024   Thrombosed external hemorrhoids 05/19/2024   Work stress 04/09/2024   Coronary artery  disease due to lipid rich plaque 04/09/2024   DOE (dyspnea on exertion) 04/07/2024   Ascending aortic aneurysm 03/17/2024   Neuropathy 02/02/2024   Changes in vision 09/14/2023   Urinary frequency 01/23/2023   Constipation 01/23/2023   RUQ pain 04/24/2021   Vitamin D  deficiency 04/24/2021   Vitamin B deficiency 04/24/2021   Balance disorder 06/22/2020   Dizziness 06/22/2020   Irritable bowel syndrome, unspecified 01/15/2019   Rhabdomyolysis 01/08/2019   Left cervical radiculopathy 01/27/2018   Left hand paresthesia 01/27/2018   Hypokalemia 03/27/2017   Heart murmur previously undiagnosed 08/14/2011   Diabetes mellitus type 2 with neurological manifestations (HCC) 05/26/2008   Essential hypertension 05/26/2008   History of colonic polyps 05/26/2008   Depression, major, single episode, complete remission 05/26/2008   PCP: Dr [name] REFERRING PROVIDER: Dr [name] REFERRING DIAG: R Reverse TSA THERAPY DIAG:  Acute pain of right shoulder  Other symptoms and signs involving the musculoskeletal system  Muscle weakness (generalized)  Rationale for Evaluation and Treatment: Rehabilitation  ONSET DATE: 11/24/24  SUBJECTIVE:  SUBJECTIVE STATEMENT: The patient reports that his shoulder is much better than last visit. He has modified his sleeping position. He has been working on exercises. Numbness in the ring and little fingers is just about gone.   EVAL: Patient reports that he had a total tear of the rotator cuff in his R shoulder. Patient reports that he fell 07/27/24, landing body weight on the R shoulder. He had pain and was unable to lift R UE or use arm. Diagnostic tests revealed torn rotator cuff. Patient underwent reverse total shoulder replacement 11/24/24. Post op course has been  uncomplicated. He has continued pain at 7-8/10 level. He is sleeping in the recliner. Pain free R shoulder at conclusion of treatment. Hand dominance: Right  PERTINENT HISTORY: Type II diabetes; hyperlipidemia; HTN; hypothyroidism: R knee pain; abdominal pain; depression; R TKA; ankle stabilization surgery 2020  PAIN:  Are you having pain? Yes: NPRS scale: 1/10 today Pain location: R shoulder Pain description: shooting; sometimes sharp  Aggravating factors: movement  Relieving factors: advil ; tylonol;   PRECAUTIONS: Shoulder R reverse TSA per protocol   WEIGHT BEARING RESTRICTIONS: Yes   FALLS:  Has patient fallen in last 6 months? Yes. 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  DIAGNOSTIC FINDINGS:  CT R shoulder 09/30/24: 1. Moderate glenohumeral joint osteoarthritis. 2. Severe acromioclavicular joint osteoarthritis. 3. Mild atrophy of the subscapularis and infraspinatus muscles.  PATIENT SURVEYS: (survey based on patient's subjective report of symptoms and activity level)  Quick DASH - 88.6/100; 88.6%      SENSATION: WFL  POSTURE: Head forward; shoulders rounded and elevated; increased thoracic kyphosis  UPPER EXTREMITY ROM: R hand, wrist, forearm, elbow WFL's    R shoulder ROM not assessed today    L UE WFL's     12/14/24: PROM R shoulder previously assessed in sitting  Passive ROM Right 12/02/24 Right  12/08/24  Right  12/14/24 Supine  Right  01/04/25 Supine   Shoulder flexion 75 deg 95  141 126  Shoulder extension      Shoulder abduction (scaption) 40 80 80 90  Shoulder adduction      Shoulder internal rotation      Shoulder external rotation  (in scapular plane)  20  30 32 44   Elbow flexion      Elbow extension      Wrist flexion      Wrist extension      Wrist ulnar deviation      Wrist radial deviation      Wrist pronation      Wrist supination      (Blank rows = not tested)  UPPER EXTREMITY MMT: not assessed  MMT Right eval Left eval  Shoulder flexion    Shoulder extension    Shoulder abduction    Shoulder adduction    Shoulder internal rotation    Shoulder external rotation    Middle trapezius    Lower trapezius    Elbow flexion    Elbow extension    Wrist flexion  Wrist extension    Wrist ulnar deviation    Wrist radial deviation    Wrist pronation    Wrist supination    Grip strength (lbs)    (Blank rows = not tested)  PALPATION:   Muscular tightness upper trap   [facility] Adult PT Treatment:                                                DATE: 01/04/25 Manual Therapy: (patient sitting) STM R upper trap; pecs; periscapular musculature; biceps/anterior shoulder  PROM R shoulder flexion; scaption; ER in scapular plane   Neuromuscular re-ed: Chin tuck 5 sec x 10  Chin tuck with cervical rotation Chin tuck with lateral cervical flexion Scap squeeze 5 sec x 10  Prolonged scap squeeze with chest lift to engage posterior shoulder girdle  Supine rhythmic stabilization  Therapeutic Activity: Shoulder flexion step back 10 sec x 5  Table slide shoulder flexion 5 sec x 3 (patient does in standing at counter at home to achieve proper height) Supine - assisting with L hand for AAROM R shoulder flexion keeping scapulae down and back (no popping or pain reported) Shoulder extension isometric at wall 5 sec x 10  Shoulder abduction isometric at wall 5 sec x 10  Shoulder flexion isometric at wall 5 sec x 10  Modalities: To ice at home  Self  Care: Instructed in sleeping position use of sling for sleeping - may return to sleep in chair until shoulder pain subsides  Continue with sling for comfort and UE support - discussed wearing sling at home to address flare up of shoulder pain  Can remove sling with R UE supported at side on pillow    [facility] Adult PT Treatment:                                                DATE: 12/31/24 Manual Therapy: (patient sitting) STM R upper trap; pecs; periscapular musculature; biceps/anterior shoulder  PROM R shoulder flexion; scaption; ER in scapular plane   Neuromuscular re-ed: Chin tuck 5 sec x 10  Chin tuck with cervical rotation Chin tuck with lateral cervical flexion Scap squeeze 5 sec x 10  Modalities: To ice at home  Self Care: Instructed in sleeping position use of sling for sleeping - may return to sleep in chair until shoulder pain subsides  Continue with sling for comfort and UE support - discussed wearing sling at home to address flare up of shoulder pain  Can remove sling with R UE supported at side on pillow    [facility] Adult PT Treatment:                                                DATE: 12/29/24 Therapeutic Exercise: Pendulum flexion; horizontal ab/adduction; circles CW/CCW x 15 each  Manual Therapy: (patient supine) STM R upper trap; forearm area  PROM R shoulder flexion; scaption; ER in scapular plane  Scar massage  Neuromuscular re-ed: Chin tuck 5 sec x 10  Scap squeeze 5 sec x 10  Isometric R shoulder flexion; abduction; extension without resistance  to activate all heads of the deltoid sitting scap squeeze 5 sec x 10 ea Standing bent forward shoulder extension to neutral 2-3 sec x 5  Therapeutic Activity: Shoulder flexion step back 10 sec x 5  Table slide shoulder flexion 5 sec x 3 (patient does in standing at counter at home to achieve proper height) Supine - assisting with L hand for AAROM R shoulder flexion keeping scapulae down and back (no popping or pain  reported) Modalities: To ice at home  Self Care: Instructed in sleeping position use of sling for sleeping  Continue with sling for comfort and UE support - discussed weaning from sling at home but continued use at night  Can remove sling with R UE supported at side on pillow    PATIENT EDUCATION: Education details: POC;HEP  Person educated: Patient Education method: Explanation, Demonstration, Tactile cues, Verbal cues, and Handouts Education comprehension: verbalized understanding, returned demonstration, verbal cues required, tactile cues required, and needs further education  HOME EXERCISE PROGRAM: Access Code: [email or web address] URL: [email or web address] Date: 01/04/2025 Prepared by: Shaneil Yazdi  Program Notes -sitting with chest up and shoulder down and back -pretend to push arm forward, out to side, and backward without moving arm-3 seconds x 10 -2 x day   Exercises - Seated Scapular Retraction  - 2 x daily - 7 x weekly - 1-2 sets - 10 reps - 10 sec  hold - Seated Cervical Retraction  - 2 x daily - 7 x weekly - 1-2 sets - 5-10 reps - 10 sec  hold - Circular Shoulder Pendulum with Table Support  - 3-4 x daily - 7 x weekly - 1 sets - 20-30 reps - Standing 'L' Stretch at Counter  - 2 x daily - 7 x weekly - 1 sets - 5-10 reps - 5 sec  hold - Seated Shoulder Flexion Towel Slide at Table Top Full Range of Motion  - 2 x daily - 7 x weekly - 1 sets - 5-10 reps - 10sec  hold - Seated Shoulder External Rotation AAROM with Dowel  - 2 x daily - 7 x weekly - 1 sets - 5-10 reps - 5 sec  hold - Supine Shoulder Flexion AAROM with Hands Clasped  - 2 x daily - 7 x weekly - 1 sets - 5-10 reps - 2-3 sec  hold - Standing Forward-Bent Shoulder Extension  - 1 x daily - 7 x weekly - 1 sets - 5-10 reps - 3 sec  hold - Standing Isometric Shoulder Extension with Doorway - Arm Bent  - 1-2 x daily - 7 x weekly - 1 sets - 5-10 reps - 5 sec  hold - Isometric Shoulder Abduction at Wall  - 1-2 x daily -  7 x weekly - 1 sets - 5-10 reps - 5 sec  hold - Isometric Shoulder Flexion at Wall  - 1-2 x daily - 7 x weekly - 1 sets - 5-10 reps - 5 sec  hold - Supine Shoulder Rhythmic Stabilization- Horizontal Abduction/Adduction  - 1 x daily - 7 x weekly - 1 sets - 5-10 reps  Patient Education - Scar Massage  ASSESSMENT:  CLINICAL IMPRESSION: Patient is 6 post reverse TSA. Reports significant improvement in shoulder pain since last visit. He has modified his sleeping position and continued with HEP. Progressed with isometric exercises in standing and assisted serratus punch in supine. Trial of rhythmic stabilization in supine. Working on chief of staff for scapular alignment and control. Patient continues to demonstrate forward posture  through the shoulders. He has increased thoracic kyphosis and sits and stands with forward rounded posture. Good progression of PROM noted today. Progressing per protocol. Slightly slower progression due to post op pain and episodic flare ups of significant pain.      He returns today with significant increased pain in the R shoulder following sleeping in adducted shoulder position. Pain has persisted throughout the day. He presents with rounded shoulder in adducted and flexed posture with rm resting across his body. Treatment address posture and alignment with gentle movement of scapula and shoulder. Using movement of cervical spine and R UE to improve tissue release R shoulder girdle. Patient tolerated treatment well with report of being pain free at end of treatment.  And encouraged patient to use pain meds on a regular basis, modify posture for sitting; keep UE supported at home.  Eval: Patient is a 69 y.o. male who was seen today for physical therapy evaluation and treatment s/p R TSA 11/24/24 following a fall with injury 07/27/24. He has been out of the sling since ~ the second post op day per MD instructions. Discussed supporting R UE when out of the sling to allow  musculature of R shoulder girdle to rest and heal without need to support the weight of the UE. Patient presents with rounded posture and alignent through the R shoulder girdle and thoracic spine; limited ROM, strength, function of R UE following reverse TSA. Of concern, he continues to report pain at 7-8/10 on a daily basis. Patient has decreased functional activities and ADL's. Tyronn will benefit from PT to address deficits and progress with shoulder rehab.   OBJECTIVE IMPAIRMENTS: decreased activity tolerance, decreased mobility, decreased ROM, decreased strength, increased muscle spasms, impaired UE functional use, improper body mechanics, postural dysfunction, and pain.   GOALS: Goals reviewed with patient? Yes  SHORT TERM GOALS: Target date: 01/11/2025  Independent in initial HEP  Baseline: Goal status: INITIAL  2.  Improve posture and alignment through shoulder girdle with patient demonstrating activation of posterior shoulder girdle musculature to improve shoulder function  Baseline:  Goal status: INITIAL  3.  P/AAROM R shoulder to 130 deg elevation and 30 deg ER in scapular plane Baseline:  Goal status: INITIAL  LONG TERM GOALS: Target date: 02/22/2025  Progress shoulder rehab per protocol or as directed by MD  Baseline:  Goal status: INITIAL  2.  PROM 145 deg elevation and 40 deg ER in scapular plane; functional IR to L! Baseline:  Goal status: INITIAL  3.  Decrease pain to 0/10 to no more than 3/10  Baseline:  Goal status: INITIAL  4.  Return to functional activities using R UE including return to work at desk/computer/phone  Baseline:  Goal status: INITIAL  5.  Improve Quick DASH score by 10% indicating improved functional activities  Baseline:  Goal status: INITIAL  6.  Independent in advanced HEP  Baseline:  Goal status: INITIAL PLAN:  PT FREQUENCY: 2x/week  PT DURATION: 12 weeks  PLANNED INTERVENTIONS: 97164- PT Re-evaluation, 97110-Therapeutic  exercises, 97530- Therapeutic activity, 97112- Neuromuscular re-education, 97535- Self Care, [id number]- Manual therapy, [phone]- Aquatic Therapy, Patient/Family education, and Taping  PLAN FOR NEXT SESSION: review and progress exercise per protocol; manual work and modalities as indicated; progress rehab per protocol and/or as ordered  - trial of isometric standing for activation of deltoid per protocol; ER with cane for AAROM; continued with exercises per protocol    Waylon Hershey SHAUNNA Baptist, PT 01/04/2025, 7:21 AM  "

## 2025-01-06 ENCOUNTER — Ambulatory Visit: Admitting: Rehabilitative and Restorative Service Providers"

## 2025-01-06 ENCOUNTER — Encounter: Payer: Self-pay | Admitting: Rehabilitative and Restorative Service Providers"

## 2025-01-06 DIAGNOSIS — R29898 Other symptoms and signs involving the musculoskeletal system: Secondary | ICD-10-CM

## 2025-01-06 DIAGNOSIS — M6281 Muscle weakness (generalized): Secondary | ICD-10-CM

## 2025-01-06 DIAGNOSIS — M25511 Pain in right shoulder: Secondary | ICD-10-CM

## 2025-01-06 NOTE — Therapy (Signed)
 " OUTPATIENT PHYSICAL THERAPY SHOULDER TREATMENT  Patient Name: Leon Nelson MRN: [id number] DOB:Jul 02, 1956, 69 y.o., male Today's Date: 01/06/2025  END OF SESSION:  PT End of Session - 01/06/25 0759     Visit Number 9    Number of Visits 24    Date for Recertification  02/22/25    Authorization Type [employer] medicare auth required $20 copay    Authorization Time Period 12/29/24-03/29/25    Authorization - Visit Number 9    Authorization - Number of Visits 22    Progress Note Due on Visit 10    PT Start Time 0800    PT Stop Time 0845    PT Time Calculation (min) 45 min    Activity Tolerance Patient tolerated treatment well         Past Medical History:  Diagnosis Date   ABDOMINAL PAIN OTHER SPECIFIED SITE 07/20/2008   Arthritis Currently   CHEST PAIN 02/06/2011   COLONIC POLYPS, HX OF 05/26/2008   DEPRESSION 05/26/2008   DIABETES MELLITUS, TYPE II 05/26/2008   HYPERLIPIDEMIA 05/26/2008   HYPERTENSION 05/26/2008   HYPOTHYROIDISM 05/26/2008   KNEE PAIN, RIGHT, ACUTE 07/01/2008   Personal history of urinary calculi 05/26/2008   Pure hypercholesterolemia 05/19/2024   UTI'S, HX OF 05/26/2008   Past Surgical History:  Procedure Laterality Date   ANKLE SURGERY Left    JOINT REPLACEMENT  09/2009   Right Knee   REVERSE SHOULDER ARTHROPLASTY Right 11/24/2024   Procedure: ARTHROPLASTY, SHOULDER, TOTAL, REVERSE;  Surgeon: Genelle Standing, MD;  Location: [facility];  Service: Orthopedics;  Laterality: Right;   s/p right knee arthroplasty      complicated by patellar tendon rupture Jan 2011 Dr. heide   TONSILLECTOMY     Patient Active Problem List   Diagnosis Date Noted   Chronic gout without tophus 12/25/2024   Hypothyroidism 12/25/2024   Traumatic complete tear of right rotator cuff 11/24/2024   Fatty liver 07/29/2024   Joint pain 05/19/2024   Low back pain 05/19/2024   Thrombosed external hemorrhoids 05/19/2024   Work stress 04/09/2024   Coronary artery  disease due to lipid rich plaque 04/09/2024   DOE (dyspnea on exertion) 04/07/2024   Ascending aortic aneurysm 03/17/2024   Neuropathy 02/02/2024   Changes in vision 09/14/2023   Urinary frequency 01/23/2023   Constipation 01/23/2023   RUQ pain 04/24/2021   Vitamin D  deficiency 04/24/2021   Vitamin B deficiency 04/24/2021   Balance disorder 06/22/2020   Dizziness 06/22/2020   Irritable bowel syndrome, unspecified 01/15/2019   Rhabdomyolysis 01/08/2019   Left cervical radiculopathy 01/27/2018   Left hand paresthesia 01/27/2018   Hypokalemia 03/27/2017   Heart murmur previously undiagnosed 08/14/2011   Diabetes mellitus type 2 with neurological manifestations (HCC) 05/26/2008   Essential hypertension 05/26/2008   History of colonic polyps 05/26/2008   Depression, major, single episode, complete remission 05/26/2008   PCP: Dr [name] REFERRING PROVIDER: Dr [name] REFERRING DIAG: R Reverse TSA THERAPY DIAG:  Acute pain of right shoulder  Other symptoms and signs involving the musculoskeletal system  Muscle weakness (generalized)  Rationale for Evaluation and Treatment: Rehabilitation  ONSET DATE: 11/24/24  SUBJECTIVE:  SUBJECTIVE STATEMENT: The patient reports that his shoulder is doing well. He has not had the pain in the biceps area that he was having and has not taken any medication this morning. Exercises are going well. He has modified his sleeping position and is sleeping better. Numbness in the ring and little fingers is just about gone.   EVAL: Patient reports that he had a total tear of the rotator cuff in his R shoulder. Patient reports that he fell 07/27/24, landing body weight on the R shoulder. He had pain and was unable to lift R UE or use arm. Diagnostic tests revealed torn  rotator cuff. Patient underwent reverse total shoulder replacement 11/24/24. Post op course has been uncomplicated. He has continued pain at 7-8/10 level. He is sleeping in the recliner. Pain free R shoulder at conclusion of treatment. Hand dominance: Right  PERTINENT HISTORY: Type II diabetes; hyperlipidemia; HTN; hypothyroidism: R knee pain; abdominal pain; depression; R TKA; ankle stabilization surgery 2020  PAIN:  Are you having pain? Yes: NPRS scale: 1/10 today Pain location: R shoulder Pain description: shooting; sometimes sharp  Aggravating factors: movement  Relieving factors: advil ; tylonol;   PRECAUTIONS: Shoulder R reverse TSA per protocol   WEIGHT BEARING RESTRICTIONS: Yes   FALLS:  Has patient fallen in last 6 months? Yes. 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  DIAGNOSTIC FINDINGS:  CT R shoulder 09/30/24: 1. Moderate glenohumeral joint osteoarthritis. 2. Severe acromioclavicular joint osteoarthritis. 3. Mild atrophy of the subscapularis and infraspinatus muscles.  PATIENT SURVEYS: (survey based on patient's subjective report of symptoms and activity level)  Quick DASH - 88.6/100; 88.6%      SENSATION: WFL  POSTURE: Head forward; shoulders rounded and elevated; increased thoracic kyphosis  UPPER EXTREMITY ROM: R hand, wrist, forearm, elbow WFL's    R shoulder ROM not assessed today    L UE WFL's     12/14/24: PROM R shoulder previously assessed in sitting  Passive ROM  Right 12/02/24 Right  12/08/24  Right  12/14/24 Supine  Right  01/04/25 Supine  Right  01/06/25 Supine   Shoulder flexion 75 deg 95 141 126 136  Shoulder extension       Shoulder abduction (scaption) 40 80 80 90 90  Shoulder adduction       Shoulder internal rotation       Shoulder external rotation  (in scapular plane)  20  30 32 44  46  Elbow flexion       Elbow extension       Wrist flexion       Wrist extension       Wrist ulnar deviation       Wrist radial deviation       Wrist pronation       Wrist supination       (Blank rows = not tested)  UPPER EXTREMITY MMT: not assessed  MMT Right eval Left eval  Shoulder flexion    Shoulder extension    Shoulder abduction  Shoulder adduction    Shoulder internal rotation    Shoulder external rotation    Middle trapezius    Lower trapezius    Elbow flexion    Elbow extension    Wrist flexion    Wrist extension    Wrist ulnar deviation    Wrist radial deviation    Wrist pronation    Wrist supination    Grip strength (lbs)    (Blank rows = not tested)  PALPATION:   Muscular tightness upper trap   [facility] Adult PT Treatment:                                                DATE: 01/06/25 Manual Therapy: (patient sitting) STM R upper trap; pecs; periscapular musculature; biceps/anterior shoulder  PROM R shoulder flexion; scaption; ER in scapular plane   Neuromuscular re-ed: Chin tuck 5 sec x 10  Chin tuck with cervical rotation Chin tuck with lateral cervical flexion Scap squeeze 5 sec x 10; repeated with coregeous ball thoracic spine  Prolonged scap squeeze with chest lift to engage posterior shoulder girdle  Scap squeeze in supine engaging posterior shoulder girdle musculature  Supine rhythmic stabilization - circles CW/CCW; flexion/extension; horizontal ab/adduction small ranges Supine serratus punch 5 x 2 Sidelying ER available range x 10   Therapeutic Activity: Shoulder flexion step back 10 sec x 5  Supine  - assisting with L hand for AAROM R shoulder flexion keeping scapulae down and back (no popping or pain reported) Shoulder extension isometric at wall 5 sec x 10  Shoulder abduction isometric at wall 5 sec x 10  Shoulder flexion isometric at wall 5 sec x 10  Sidelying shoulder abduction with elbow flexed x 10  Sidelying shoulder abduction with elbow extended with PT assist to raise and lower to avoid abnormal movement pattern x 8  Modalities: Icing at home  Self Care: Instructed in sleeping position use of sling for sleeping - may return to sleep in chair as needed if there is flare up of pain Continue with sling for comfort and UE support - discussed wearing sling at home to address flare up of shoulder pain  Preferred position  R UE supported at side on pillow when sitting for prolonged periods of time   [facility] Adult PT Treatment:                                                DATE: 01/04/25 Manual Therapy: (patient sitting) STM R upper trap; pecs; periscapular musculature; biceps/anterior shoulder  PROM R shoulder flexion; scaption; ER in scapular plane   Neuromuscular re-ed: Chin tuck 5 sec x 10  Chin tuck with cervical rotation Chin tuck with lateral cervical flexion Scap squeeze 5 sec x 10  Prolonged scap squeeze with chest lift to engage posterior shoulder girdle  Supine rhythmic stabilization  Therapeutic Activity: Shoulder flexion step back 10 sec x 5  Table slide shoulder flexion 5 sec x 3 (patient does in standing at counter at home to achieve proper height) Supine - assisting with L hand for AAROM R shoulder flexion keeping scapulae down and back (no popping or pain reported) Shoulder extension isometric at wall 5 sec x 10  Shoulder abduction isometric  at wall 5 sec x 10  Shoulder flexion isometric at wall 5 sec x 10  Modalities: To ice at home  Self Care: Instructed in sleeping position use of sling for sleeping - may return to sleep in chair until shoulder pain subsides   Continue with sling for comfort and UE support - discussed wearing sling at home to address flare up of shoulder pain  Can remove sling with R UE supported at side on pillow    PATIENT EDUCATION: Education details: POC;HEP  Person educated: Patient Education method: Explanation, Demonstration, Tactile cues, Verbal cues, and Handouts Education comprehension: verbalized understanding, returned demonstration, verbal cues required, tactile cues required, and needs further education  HOME EXERCISE PROGRAM: Access Code: [email or web address] URL: [email or web address] Date: 01/06/2025 Prepared by: Khani Paino  Program Notes -sitting with chest up and shoulder down and back -pretend to push arm forward, out to side, and backward without moving arm-3 seconds x 10 -2 x day   Exercises - Seated Scapular Retraction  - 2 x daily - 7 x weekly - 1-2 sets - 10 reps - 10 sec  hold - Seated Cervical Retraction  - 2 x daily - 7 x weekly - 1-2 sets - 5-10 reps - 10 sec  hold - Circular Shoulder Pendulum with Table Support  - 3-4 x daily - 7 x weekly - 1 sets - 20-30 reps - Standing 'L' Stretch at Counter  - 2 x daily - 7 x weekly - 1 sets - 5-10 reps - 5 sec  hold - Seated Shoulder Flexion Towel Slide at Table Top Full Range of Motion  - 2 x daily - 7 x weekly - 1 sets - 5-10 reps - 10sec  hold - Seated Shoulder External Rotation AAROM with Dowel  - 2 x daily - 7 x weekly - 1 sets - 5-10 reps - 5 sec  hold - Supine Shoulder Flexion AAROM with Hands Clasped  - 2 x daily - 7 x weekly - 1 sets - 5-10 reps - 2-3 sec  hold - Standing Forward-Bent Shoulder Extension  - 1 x daily - 7 x weekly - 1 sets - 5-10 reps - 3 sec  hold - Standing Isometric Shoulder Extension with Doorway - Arm Bent  - 1-2 x daily - 7 x weekly - 1 sets - 5-10 reps - 5 sec  hold - Isometric Shoulder Abduction at Wall  - 1-2 x daily - 7 x weekly - 1 sets - 5-10 reps - 5 sec  hold - Isometric Shoulder Flexion at Wall  - 1-2 x daily - 7 x  weekly - 1 sets - 5-10 reps - 5 sec  hold - Supine Shoulder Rhythmic Stabilization- Horizontal Abduction/Adduction  - 1 x daily - 7 x weekly - 1 sets - 5-10 reps - Sidelying Shoulder External Rotation  - 2 x daily - 7 x weekly - 1 sets - 10 reps - 3 sec  hold - Sidelying Shoulder Abduction Palm Forward  - 1 x daily - 7 x weekly - 1 sets - 5-10 reps - 3 sec  hold  Patient Education - Scar Massage  ASSESSMENT:  CLINICAL IMPRESSION: Patient is 6 weeks 1 day post reverse TSA. Reports significant improvement in shoulder pain. He is sleeping better and working on the HEP without difficulty. Working on isometric exercises in standing and serratus punch in supine as well as rhythmic stabilization. Working on chief of staff for scapular alignment and control. Patient continues to demonstrate increased thoracic  kyphosis and sits/stands with forward rounded posture. Good progression of PROM noted today. Progressing per protocol. Slightly slower progression due to post op pain and episodic flare ups of significant pain.      He returns today with significant increased pain in the R shoulder following sleeping in adducted shoulder position. Pain has persisted throughout the day. He presents with rounded shoulder in adducted and flexed posture with rm resting across his body. Treatment address posture and alignment with gentle movement of scapula and shoulder. Using movement of cervical spine and R UE to improve tissue release R shoulder girdle. Patient tolerated treatment well with report of being pain free at end of treatment.  And encouraged patient to use pain meds on a regular basis, modify posture for sitting; keep UE supported at home.  Eval: Patient is a 69 y.o. male who was seen today for physical therapy evaluation and treatment s/p R TSA 11/24/24 following a fall with injury 07/27/24. He has been out of the sling since ~ the second post op day per MD instructions. Discussed supporting R UE when  out of the sling to allow musculature of R shoulder girdle to rest and heal without need to support the weight of the UE. Patient presents with rounded posture and alignent through the R shoulder girdle and thoracic spine; limited ROM, strength, function of R UE following reverse TSA. Of concern, he continues to report pain at 7-8/10 on a daily basis. Patient has decreased functional activities and ADL's. Jr will benefit from PT to address deficits and progress with shoulder rehab.   OBJECTIVE IMPAIRMENTS: decreased activity tolerance, decreased mobility, decreased ROM, decreased strength, increased muscle spasms, impaired UE functional use, improper body mechanics, postural dysfunction, and pain.   GOALS: Goals reviewed with patient? Yes  SHORT TERM GOALS: Target date: 01/11/2025  Independent in initial HEP  Baseline: Goal status: INITIAL  2.  Improve posture and alignment through shoulder girdle with patient demonstrating activation of posterior shoulder girdle musculature to improve shoulder function  Baseline:  Goal status: INITIAL  3.  P/AAROM R shoulder to 130 deg elevation and 30 deg ER in scapular plane Baseline:  Goal status: INITIAL  LONG TERM GOALS: Target date: 02/22/2025  Progress shoulder rehab per protocol or as directed by MD  Baseline:  Goal status: INITIAL  2.  PROM 145 deg elevation and 40 deg ER in scapular plane; functional IR to L! Baseline:  Goal status: INITIAL  3.  Decrease pain to 0/10 to no more than 3/10  Baseline:  Goal status: INITIAL  4.  Return to functional activities using R UE including return to work at desk/computer/phone  Baseline:  Goal status: INITIAL  5.  Improve Quick DASH score by 10% indicating improved functional activities  Baseline:  Goal status: INITIAL  6.  Independent in advanced HEP  Baseline:  Goal status: INITIAL PLAN:  PT FREQUENCY: 2x/week  PT DURATION: 12 weeks  PLANNED INTERVENTIONS: 97164- PT Re-evaluation,  97110-Therapeutic exercises, 97530- Therapeutic activity, 97112- Neuromuscular re-education, 97535- Self Care, [id number]- Manual therapy, [phone]- Aquatic Therapy, Patient/Family education, and Taping  PLAN FOR NEXT SESSION: review and progress exercise per protocol; manual work and modalities as indicated; progress rehab per protocol and/or as ordered  - trial of isometric standing for activation of deltoid per protocol; ER with cane for AAROM; continued with exercises per protocol    Branndon Tuite SHAUNNA Baptist, PT 01/06/2025, 8:00 AM  "

## 2025-01-08 ENCOUNTER — Other Ambulatory Visit: Payer: Self-pay | Admitting: Student

## 2025-01-08 ENCOUNTER — Ambulatory Visit (HOSPITAL_BASED_OUTPATIENT_CLINIC_OR_DEPARTMENT_OTHER): Admitting: Orthopaedic Surgery

## 2025-01-08 DIAGNOSIS — S46011A Strain of muscle(s) and tendon(s) of the rotator cuff of right shoulder, initial encounter: Secondary | ICD-10-CM

## 2025-01-08 MED ORDER — SOLIFENACIN SUCCINATE 5 MG PO TABS: 5.0000 mg | ORAL_TABLET | Freq: Every day | ORAL | 0 refills | Status: DC

## 2025-01-08 NOTE — Progress Notes (Signed)
 "                                Post Operative Evaluation    Procedure/Date of Surgery: Right shoulder reverse shoulder arthroplasty 12/2  Interval History:   Presents today for follow-up 6 weeks status post above procedure.  Overall he is continuing to improve.  Overhead range of motion is coming along slowly but surely   PMH/PSH/Family History/Social History/Meds/Allergies:    Past Medical History:  Diagnosis Date   ABDOMINAL PAIN OTHER SPECIFIED SITE 07/20/2008   Arthritis Currently   CHEST PAIN 02/06/2011   COLONIC POLYPS, HX OF 05/26/2008   DEPRESSION 05/26/2008   DIABETES MELLITUS, TYPE II 05/26/2008   HYPERLIPIDEMIA 05/26/2008   HYPERTENSION 05/26/2008   HYPOTHYROIDISM 05/26/2008   KNEE PAIN, RIGHT, ACUTE 07/01/2008   Personal history of urinary calculi 05/26/2008   Pure hypercholesterolemia 05/19/2024   UTI'S, HX OF 05/26/2008   Past Surgical History:  Procedure Laterality Date   ANKLE SURGERY Left    JOINT REPLACEMENT  09/2009   Right Knee   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 heide   TONSILLECTOMY     Social History   Socioeconomic History   Marital status: Married    Spouse name: Not on file   Number of children: Not on file   Years of education: Not on file   Highest education level: Some college, no degree  Occupational History   Not on file  Tobacco Use   Smoking status: Never   Smokeless tobacco: Never  Vaping Use   Vaping status: Never Used  Substance and Sexual Activity   Alcohol use: No   Drug use: No   Sexual activity: Yes  Other Topics Concern   Not on file  Social History Narrative   married   Social Drivers of Health   Tobacco Use: Low Risk (01/06/2025)   Patient  History    Smoking Tobacco Use: Never    Smokeless Tobacco Use: Never    Passive Exposure: Not on file  Financial Resource Strain: Low Risk (12/23/2024)   Overall Financial Resource Strain (CARDIA)    Difficulty of Paying Living Expenses: Not hard at all  Food Insecurity: No Food Insecurity (12/23/2024)   Epic    Worried About Radiation Protection Practitioner of Food in the Last Year: Never true    Ran Out of Food in the Last Year: Never true  Transportation Needs: No Transportation Needs (12/23/2024)   Epic    Lack of Transportation (Medical): No    Lack of Transportation (Non-Medical): No  Physical Activity: Inactive (12/23/2024)   Exercise Vital Sign    Days of Exercise per Week: 0 days    Minutes of Exercise per Session: Not on file  Stress: No Stress Concern Present (12/23/2024)   [employer] of Occupational Health - Occupational Stress Questionnaire    Feeling of Stress: Not at all  Social Connections: Moderately Integrated (12/23/2024)   Social Connection and Isolation Panel    Frequency of Communication with Friends and Family: Twice a week    Frequency of Social Gatherings with Friends and Family: Once a week    Attends Religious Services: More than 4 times per year  Active Member of Clubs or Organizations: No    Attends Banker Meetings: Not on file    Marital Status: Married  Depression [phone]): Low Risk (12/25/2024)   Depression (PHQ2-9)    PHQ-2 Score: 0  Alcohol Screen: Low Risk (05/26/2024)   Alcohol Screen    Last Alcohol Screening Score (AUDIT): 0  Housing: Low Risk (12/23/2024)   Epic    Unable to Pay for Housing in the Last Year: No    Number of Times Moved in the Last Year: 0    Homeless in the Last Year: No  Utilities: Not At Risk (05/26/2024)   AHC Utilities    Threatened with loss of utilities: No  Health Literacy: Adequate Health Literacy (05/26/2024)   B1300 Health Literacy    Frequency of need for help with medical instructions: Never   Family  History  Problem Relation Age of Onset   Cancer Mother        breast cancer   Hyperlipidemia Mother    Heart disease Mother    Hyperlipidemia Father    Heart disease Father    Dementia Maternal Grandfather    Dementia Maternal Uncle    Alcohol abuse Other    Diabetes Other    Colon cancer Neg Hx    Prostate cancer Neg Hx    Allergies[1] Current Outpatient Medications  Medication Sig Dispense Refill   albuterol  (VENTOLIN  HFA) 108 (90 Base) MCG/ACT inhaler Inhale 2 puffs into the lungs every 6 (six) hours as needed for wheezing or shortness of breath. 8 g 11   allopurinol  (ZYLOPRIM ) 300 MG tablet TAKE 1 TABLET BY MOUTH DAILY 100 tablet 2   amitriptyline  (ELAVIL ) 50 MG tablet 1 - 2 tabs by mouth at bedtime for nerve pain 60 tablet 5   aspirin  EC 81 MG tablet Take 1 tablet (81 mg total) by mouth daily. Swallow whole.     atenolol  (TENORMIN ) 25 MG tablet Take 1 tablet (25 mg total) by mouth daily. 90 tablet 1   Blood Glucose Calibration (ACCU-CHEK GUIDE CONTROL) LIQD Use as directed by manufacturer  E11.9 1 each 1   Blood Glucose Monitoring Suppl (ACCU-CHEK GUIDE ME) w/Device KIT Use as directed up to four times per day E11.9 1 kit 0   Cholecalciferol (VITAMIN D3) 25 MCG (1000 UT) CAPS Take 1 capsule (1,000 Units total) by mouth daily.     Continuous Glucose Receiver (FREESTYLE LIBRE 3 READER) DEVI Use as directed four times per day E11.9 1 each 0   Continuous Glucose Sensor (FREESTYLE LIBRE 3 PLUS SENSOR) MISC Change sensor every 15 days. E11.9 6 each 3   cyanocobalamin  (VITAMIN B12) 1000 MCG tablet Take 1 tablet (1,000 mcg total) by mouth daily.     dapagliflozin  propanediol (FARXIGA ) 10 MG TABS tablet Take 1 tablet (10 mg total) by mouth daily before breakfast. 90 tablet 3   diclofenac  sodium (VOLTAREN ) 1 % GEL Apply 2 g topically 4 (four) times daily as needed. 200 g 5   dicyclomine  (BENTYL ) 20 MG tablet Take 1 tablet (20 mg total) by mouth 3 (three) times daily as needed for spasms.  300 tablet 0   furosemide  (LASIX ) 20 MG tablet Take 1 tablet (20 mg total) by mouth daily. 90 tablet 1   gabapentin  (NEURONTIN ) 300 MG capsule Take 1 capsule (300 mg total) by mouth 2 (two) times daily. 180 capsule 1   glipiZIDE  (GLUCOTROL  XL) 10 MG 24 hr tablet Take 1 tablet (10 mg total) by mouth daily  with breakfast. 90 tablet 1   ketoconazole (NIZORAL) 2 % shampoo Apply 1 Application topically 3 (three) times a week.     Lancets (ONETOUCH DELICA PLUS LANCET30G) MISC USE AS DIRECTED ONCE DAILY 100 each 2   levothyroxine  (SYNTHROID ) 50 MCG tablet TAKE 1 TABLET BY MOUTH DAILY 100 tablet 3   lisinopril  (ZESTRIL ) 20 MG tablet TAKE 1 TABLET BY MOUTH DAILY 100 tablet 2   metFORMIN  (GLUCOPHAGE -XR) 500 MG 24 hr tablet TAKE 2 TABLETS BY MOUTH TWICE  DAILY WITH MEALS 400 tablet 2   potassium chloride  SA (KLOR-CON  M) 20 MEQ tablet TAKE 2 TABLETS BY MOUTH DAILY 200 tablet 2   rosuvastatin  (CRESTOR ) 40 MG tablet TAKE 1 TABLET BY MOUTH DAILY 100 tablet 2   Semaglutide , 1 MG/DOSE, 4 MG/3ML SOPN Inject 1 mg as directed once a week. 9 mL 3   solifenacin  (VESICARE ) 5 MG tablet Take 1 tablet by mouth once daily 90 tablet 0   venlafaxine  XR (EFFEXOR -XR) 75 MG 24 hr capsule TAKE 1 CAPSULE BY MOUTH DAILY 100 capsule 2   No current facility-administered medications for this visit.   No results found.  Review of Systems:   A ROS was performed including pertinent positives and negatives as documented in the HPI.   Musculoskeletal Exam:    There were no vitals taken for this visit.  Right shoulder incisions well-appearing without erythema or drainage.  In the supine position he can forward elevate passively and passive assisted to 90 degrees flexion external rotation at the side is to 30 degrees  Imaging:    3 views right shoulder: Status post reverse shoulder arthroplasty without evidence of complication  I personally reviewed and interpreted the radiographs.   Assessment:   Presents 6 weeks status  post right reverse shoulder arthroplasty overall doing extremely well.  At this point we will plan to pursue full active range of motion I will plan to see him back in 6 weeks for reassessment  Plan :    - Return to clinic 6 weeks for reassessment      I personally saw and evaluated the patient, and participated in the management and treatment plan.  Elspeth Parker, MD Attending Physician, Orthopedic Surgery  This document was dictated using Dragon voice recognition software. A reasonable attempt at proof reading has been made to minimize errors.     [1]  Allergies Allergen Reactions   Penicillins Other (See Comments)    Blisters on hands   Sulfonamide Derivatives Other (See Comments)    Blisters in groin area   "

## 2025-01-08 NOTE — Telephone Encounter (Signed)
 Copied from CRM [id number]. Topic: Clinical - Medication Refill >> Jan 08, 2025  2:57 PM Montie POUR wrote: Medication:  solifenacin  (VESICARE ) 5 MG tablet   Has the patient contacted their pharmacy? Yes (Agent: If no, request that the patient contact the pharmacy for the refill. If patient does not wish to contact the pharmacy document the reason why and proceed with request.) (Agent: If yes, when and what did the pharmacy advise?) Pharmacy needs order to refill  This is the patient's preferred pharmacy:  [facility] - [place], [place] - [address] [address] Phone: [phone] Fax: [phone]  Is this the correct pharmacy for this prescription? Yes If no, delete pharmacy and type the correct one.   Has the prescription been filled recently? No  Is the patient out of the medication? No He has 9 days left.  Has the patient been seen for an appointment in the last year OR does the patient have an upcoming appointment? Yes  Can we respond through MyChart? Yes  Agent: Please be advised that Rx refills may take up to 3 business days. We ask that you follow-up with your pharmacy.

## 2025-01-11 ENCOUNTER — Ambulatory Visit: Admitting: Rehabilitative and Restorative Service Providers"

## 2025-01-11 ENCOUNTER — Encounter: Payer: Self-pay | Admitting: Rehabilitative and Restorative Service Providers"

## 2025-01-11 DIAGNOSIS — R29898 Other symptoms and signs involving the musculoskeletal system: Secondary | ICD-10-CM

## 2025-01-11 DIAGNOSIS — M6281 Muscle weakness (generalized): Secondary | ICD-10-CM

## 2025-01-11 DIAGNOSIS — M25511 Pain in right shoulder: Secondary | ICD-10-CM

## 2025-01-11 NOTE — Therapy (Signed)
 " OUTPATIENT PHYSICAL THERAPY SHOULDER TREATMENT Physical Therapy Progress Note  Dates of Reporting Period: 11/30/25 to 01/11/25  Objective Reports of Subjective Statement: progressing well with decreased pain and increased exercise tolerance   Objective Measurements: see ROM measurements 01/06/25  Goal Update: as noted - patient has accomplished most of STGs and continues to work on [employer]: continue treatment to increase ROM; strength; function   Reason Skilled Services are Required: to reach maximum rehab potential and return to normal functional and work tasks    Patient Name: Leon Nelson MRN: [id number] DOB:1955-12-26, 69 y.o., male Today's Date: 01/11/2025  END OF SESSION:  PT End of Session - 01/11/25 0714     Visit Number 10    Number of Visits 24    Date for Recertification  02/22/25    Authorization Type [employer] medicare auth required $25 copay    Authorization Time Period 12/29/24-03/29/25    Authorization - Visit Number 10    Authorization - Number of Visits 16    Progress Note Due on Visit 10    PT Start Time 0712    PT Stop Time 0800    PT Time Calculation (min) 48 min    Activity Tolerance Patient tolerated treatment well         Past Medical History:  Diagnosis Date   ABDOMINAL PAIN OTHER SPECIFIED SITE 07/20/2008   Arthritis Currently   CHEST PAIN 02/06/2011   COLONIC POLYPS, HX OF 05/26/2008   DEPRESSION 05/26/2008   DIABETES MELLITUS, TYPE II 05/26/2008   HYPERLIPIDEMIA 05/26/2008   HYPERTENSION 05/26/2008   HYPOTHYROIDISM 05/26/2008   KNEE PAIN, RIGHT, ACUTE 07/01/2008   Personal history of urinary calculi 05/26/2008   Pure hypercholesterolemia 05/19/2024   UTI'S, HX OF 05/26/2008   Past Surgical History:  Procedure Laterality Date   ANKLE SURGERY Left    JOINT REPLACEMENT  09/2009   Right Knee   REVERSE SHOULDER ARTHROPLASTY Right 11/24/2024   Procedure: ARTHROPLASTY, SHOULDER, TOTAL, REVERSE;  Surgeon: Genelle Standing, MD;  Location: [facility];  Service: Orthopedics;  Laterality: Right;   s/p right knee arthroplasty      complicated by patellar tendon rupture Jan 2011 Dr. heide   TONSILLECTOMY     Patient Active Problem List   Diagnosis Date Noted   Chronic gout without tophus 12/25/2024   Hypothyroidism 12/25/2024   Traumatic complete tear of right rotator cuff 11/24/2024   Fatty liver 07/29/2024   Joint pain 05/19/2024   Low back pain 05/19/2024   Thrombosed external hemorrhoids 05/19/2024   Work stress 04/09/2024   Coronary artery disease due to lipid rich plaque 04/09/2024   DOE (dyspnea on exertion) 04/07/2024   Ascending aortic aneurysm 03/17/2024   Neuropathy 02/02/2024   Changes in vision 09/14/2023   Urinary frequency 01/23/2023   Constipation 01/23/2023   RUQ pain 04/24/2021   Vitamin D  deficiency 04/24/2021   Vitamin B deficiency 04/24/2021   Balance disorder 06/22/2020   Dizziness 06/22/2020   Irritable bowel syndrome, unspecified 01/15/2019   Rhabdomyolysis 01/08/2019   Left cervical radiculopathy 01/27/2018   Left hand paresthesia 01/27/2018   Hypokalemia 03/27/2017   Heart murmur previously undiagnosed 08/14/2011   Diabetes mellitus type 2 with neurological manifestations (HCC) 05/26/2008   Essential hypertension 05/26/2008   History of colonic polyps 05/26/2008   Depression, major, single episode, complete remission 05/26/2008   PCP: Dr [name] REFERRING PROVIDER: Dr [name] REFERRING DIAG: R Reverse TSA THERAPY DIAG:  Acute pain  of right shoulder  Other symptoms and signs involving the musculoskeletal system  Muscle weakness (generalized)  Rationale for Evaluation and Treatment: Rehabilitation  ONSET DATE: 11/24/24  SUBJECTIVE:                                                                                                                                                                                     SUBJECTIVE STATEMENT: The patient reports that he  saw surgeon Friday and he was pleased with his progress. He released Jarel to return to work 01/25/25. Exercises are going well. He has some soreness but no pain. He has modified his sleeping position and is sleeping better. Numbness in the ring and little fingers is just about gone. Noticed it a couple of times over the weekend.   EVAL: Patient reports that he had a total tear of the rotator cuff in his R shoulder. Patient reports that he fell 07/27/24, landing body weight on the R shoulder. He had pain and was unable to lift R UE or use arm. Diagnostic tests revealed torn rotator cuff. Patient underwent reverse total shoulder replacement 11/24/24. Post op course has been uncomplicated. He has continued pain at 7-8/10 level. He is sleeping in the recliner. Pain free R shoulder at conclusion of treatment. Hand dominance: Right  PERTINENT HISTORY: Type II diabetes; hyperlipidemia; HTN; hypothyroidism: R knee pain; abdominal pain; depression; R TKA; ankle stabilization surgery 2020  PAIN:  Are you having pain? Yes: NPRS scale: 0/10 today - just some soreness Pain location: R shoulder Pain description: shooting; sometimes sharp  Aggravating factors: movement  Relieving factors: advil ; tylonol;   PRECAUTIONS: Shoulder R reverse TSA per protocol   WEIGHT BEARING RESTRICTIONS: Yes   FALLS:  Has patient fallen in last 6 months? Yes. 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  DIAGNOSTIC FINDINGS:  CT R shoulder 09/30/24:  1. Moderate glenohumeral joint osteoarthritis. 2. Severe acromioclavicular joint osteoarthritis. 3. Mild atrophy of the subscapularis and infraspinatus muscles.  PATIENT SURVEYS: (survey based on patient's subjective report of symptoms and activity level)  Quick DASH - 88.6/100; 88.6%      SENSATION: WFL  POSTURE: Head forward; shoulders rounded and elevated; increased  thoracic kyphosis  UPPER EXTREMITY ROM: R hand, wrist, forearm, elbow WFL's    R shoulder ROM not assessed today    L UE WFL's     12/14/24: PROM R shoulder previously assessed in sitting  Passive ROM Right 12/02/24 Right  12/08/24  Right  12/14/24 Supine  Right  01/04/25 Supine  Right  01/06/25 Supine   Shoulder flexion 75 deg 95 141 126 136  Shoulder extension       Shoulder abduction (scaption) 40 80 80 90 90  Shoulder adduction       Shoulder internal rotation       Shoulder external rotation  (in scapular plane)  20  30 32 44  46  Elbow flexion       Elbow extension       Wrist flexion       Wrist extension       Wrist ulnar deviation       Wrist radial deviation       Wrist pronation       Wrist supination       (Blank rows = not tested)  UPPER EXTREMITY MMT: not assessed  MMT Right eval Left eval  Shoulder flexion    Shoulder extension    Shoulder abduction    Shoulder adduction    Shoulder internal rotation    Shoulder external rotation    Middle trapezius    Lower trapezius    Elbow flexion    Elbow extension    Wrist flexion    Wrist extension    Wrist ulnar deviation    Wrist radial deviation    Wrist pronation    Wrist supination    Grip strength (lbs)    (Blank rows = not tested)  PALPATION:   Muscular tightness upper trap   [facility] Adult PT Treatment:                                                DATE: 01/11/25 Manual Therapy: (patient sitting) STM R upper trap; pecs; periscapular musculature; biceps/anterior shoulder  PROM R shoulder flexion; scaption; ER in scapular  plane   Neuromuscular re-ed: Chin tuck 5 sec x 10  Chin tuck with cervical rotation Chin tuck with lateral cervical flexion Scap squeeze 5 sec x 10; repeated with coregeous ball thoracic spine  Prolonged scap squeeze with chest lift to engage posterior shoulder girdle  Scap squeeze in supine engaging posterior shoulder girdle musculature  Supine rhythmic stabilization - circles CW/CCW; flexion/extension; horizontal ab/adduction small ranges Supine serratus punch 5 x 2 Sidelying ER available range x 10  Shoulder flexion on incline ~ 60 deg x 10 x 2   Therapeutic Activity: Shoulder flexion step back 10 sec x 5  Supine - assisting with L hand for AAROM R shoulder flexion keeping scapulae down and back (no popping or pain reported) Shoulder extension isometric at wall 5 sec x 10 x 2 Shoulder abduction isometric at wall 5 sec x 10 x 2 Shoulder flexion isometric at wall 5 sec x 10 x 2 Isometric row green TB 3 sec x 10  Sidelying shoulder abduction with elbow flexed x 10 x 2  Sidelying shoulder abduction with elbow extended with PT x 4 Supine shoulder flexion through partial range 90 to 120 degrees   Modalities: Icing at home  Self Care: Instructed in sleeping  position use of sling for sleeping - may return to sleep in chair as needed if there is flare up of pain Continue with sling for comfort and UE support - discussed wearing sling at home to address flare up of shoulder pain  Preferred position  R UE supported at side on pillow when sitting for prolonged periods of time  [facility] Adult PT Treatment:                                                DATE: 01/06/25 Manual Therapy: (patient sitting) STM R upper trap; pecs; periscapular musculature; biceps/anterior shoulder  PROM R shoulder flexion; scaption; ER in scapular plane   Neuromuscular re-ed: Chin tuck 5 sec x 10  Chin tuck with cervical rotation Chin tuck with lateral cervical flexion Scap squeeze 5 sec x 10; repeated with  coregeous ball thoracic spine  Prolonged scap squeeze with chest lift to engage posterior shoulder girdle  Scap squeeze in supine engaging posterior shoulder girdle musculature  Supine rhythmic stabilization - circles CW/CCW; flexion/extension; horizontal ab/adduction small ranges Supine serratus punch 5 x 2 Sidelying ER available range x 10   Therapeutic Activity: Shoulder flexion step back 10 sec x 5  Supine - assisting with L hand for AAROM R shoulder flexion keeping scapulae down and back (no popping or pain reported) Shoulder extension isometric at wall 5 sec x 10  Shoulder abduction isometric at wall 5 sec x 10  Shoulder flexion isometric at wall 5 sec x 10  Sidelying shoulder abduction with elbow flexed x 10  Sidelying shoulder abduction with elbow extended with PT assist to raise and lower to avoid abnormal movement pattern x 8  Modalities: Icing at home  Self Care: Instructed in sleeping position use of sling for sleeping - may return to sleep in chair as needed if there is flare up of pain Continue with sling for comfort and UE support - discussed wearing sling at home to address flare up of shoulder pain  Preferred position  R UE supported at side on pillow when sitting for prolonged periods of time   PATIENT EDUCATION: Education details: POC;HEP  Person educated: Patient Education method: Explanation, Demonstration, Tactile cues, Verbal cues, and Handouts Education comprehension: verbalized understanding, returned demonstration, verbal cues required, tactile cues required, and needs further education  HOME EXERCISE PROGRAM: Access Code: [email or web address] URL: [email or web address] Date: 01/11/2025 Prepared by: Demitrus Francisco  Program Notes -sitting with chest up and shoulder down and back -pretend to push arm forward, out to side, and backward without moving arm-3 seconds x 10 -2 x day   Exercises - Seated Scapular Retraction  - 2 x daily - 7 x weekly - 1-2 sets -  10 reps - 10 sec  hold - Seated Cervical Retraction  - 2 x daily - 7 x weekly - 1-2 sets - 5-10 reps - 10 sec  hold - Circular Shoulder Pendulum with Table Support  - 3-4 x daily - 7 x weekly - 1 sets - 20-30 reps - Standing 'L' Stretch at Counter  - 2 x daily - 7 x weekly - 1 sets - 5-10 reps - 5 sec  hold - Seated Shoulder External Rotation AAROM with Dowel  - 2 x daily - 7 x weekly - 1 sets - 5-10 reps - 5 sec  hold - Supine Shoulder Flexion AAROM  with Hands Clasped  - 2 x daily - 7 x weekly - 1 sets - 5-10 reps - 2-3 sec  hold - Standing Forward-Bent Shoulder Extension  - 1 x daily - 7 x weekly - 1 sets - 5-10 reps - 3 sec  hold - Standing Isometric Shoulder Extension with Doorway - Arm Bent  - 1-2 x daily - 7 x weekly - 1 sets - 5-10 reps - 5 sec  hold - Isometric Shoulder Abduction at Wall  - 1-2 x daily - 7 x weekly - 1 sets - 5-10 reps - 5 sec  hold - Isometric Shoulder Flexion at Wall  - 1-2 x daily - 7 x weekly - 1 sets - 5-10 reps - 5 sec  hold - Supine Shoulder Rhythmic Stabilization- Horizontal Abduction/Adduction  - 1 x daily - 7 x weekly - 1 sets - 5-10 reps - Sidelying Shoulder External Rotation  - 2 x daily - 7 x weekly - 1 sets - 10 reps - 3 sec  hold - Sidelying Shoulder Abduction Palm Forward  - 1 x daily - 7 x weekly - 1 sets - 5-10 reps - 3 sec  hold - Standing Shoulder Row Reactive Isometric  - 2 x daily - 7 x weekly - 1 sets - 10 reps - 30-45 sec  hold  Patient Education - Scar Massage  ASSESSMENT:  CLINICAL IMPRESSION: Patient is 6 weeks 6 days post reverse TSA. He saw his surgeon 01/07/25 and he was pleased with patient's progress. He released Cresencio to return to work 01/25/25 at desk and computer. Roniel reports continued improvement in shoulder pain, now having some soreness but minimal pain. He is sleeping better and working on the HEP without difficulty. Working on isometric exercises in standing and serratus punch in supine as well as rhythmic stabilization. Working  on chief of staff for scapular alignment and control. Patient continues to demonstrate increased thoracic kyphosis and sits/stands with forward rounded posture. Good progression of PROM and activation of movement. Progressing per protocol. Slightly slower progression due to post op pain and episodic flare ups of significant pain.      He returns today with significant increased pain in the R shoulder following sleeping in adducted shoulder position. Pain has persisted throughout the day. He presents with rounded shoulder in adducted and flexed posture with rm resting across his body. Treatment address posture and alignment with gentle movement of scapula and shoulder. Using movement of cervical spine and R UE to improve tissue release R shoulder girdle. Patient tolerated treatment well with report of being pain free at end of treatment.  And encouraged patient to use pain meds on a regular basis, modify posture for sitting; keep UE supported at home.  Eval: Patient is a 69 y.o. male who was seen today for physical therapy evaluation and treatment s/p R TSA 11/24/24 following a fall with injury 07/27/24. He has been out of the sling since ~ the second post op day per MD instructions. Discussed supporting R UE when out of the sling to allow musculature of R shoulder girdle to rest and heal without need to support the weight of the UE. Patient presents with rounded posture and alignent through the R shoulder girdle and thoracic spine; limited ROM, strength, function of R UE following reverse TSA. Of concern, he continues to report pain at 7-8/10 on a daily basis. Patient has decreased functional activities and ADL's. Maxx will benefit from PT to address deficits and progress with shoulder  rehab.   OBJECTIVE IMPAIRMENTS: decreased activity tolerance, decreased mobility, decreased ROM, decreased strength, increased muscle spasms, impaired UE functional use, improper body mechanics, postural dysfunction,  and pain.   GOALS: Goals reviewed with patient? Yes  SHORT TERM GOALS: Target date: 01/11/2025  Independent in initial HEP  Baseline: Goal status: met  2.  Improve posture and alignment through shoulder girdle with patient demonstrating activation of posterior shoulder girdle musculature to improve shoulder function  Baseline:  Goal status: met 3.  P/AAROM R shoulder to 130 deg elevation and 30 deg ER in scapular plane Baseline:  Goal status: INITIAL  LONG TERM GOALS: Target date: 02/22/2025  Progress shoulder rehab per protocol or as directed by MD  Baseline:  Goal status: on gong   2.  PROM 145 deg elevation and 40 deg ER in scapular plane; functional IR to L! Baseline:  Goal status: on going  3.  Decrease pain to 0/10 to no more than 3/10  Baseline:  Goal status: partially met  4.  Return to functional activities using R UE including return to work at desk/computer/phone  Baseline:  Goal status: on going  5.  Improve Quick DASH score by 10% indicating improved functional activities  Baseline:  Goal status: on going  6.  Independent in advanced HEP  Baseline:  Goal status: on going PLAN:  PT FREQUENCY: 2x/week  PT DURATION: 12 weeks  PLANNED INTERVENTIONS: 97164- PT Re-evaluation, 97110-Therapeutic exercises, 97530- Therapeutic activity, 97112- Neuromuscular re-education, 97535- Self Care, [id number]- Manual therapy, [phone]- Aquatic Therapy, Patient/Family education, and Taping  PLAN FOR NEXT SESSION: review and progress exercise per protocol; manual work and modalities as indicated; progress rehab per protocol and/or as ordered;  continued with exercises per protocol    Ziva Nunziata SHAUNNA Baptist, PT 01/11/2025, 7:16 AM  "

## 2025-01-13 ENCOUNTER — Ambulatory Visit: Admitting: Rehabilitative and Restorative Service Providers"

## 2025-01-13 ENCOUNTER — Telehealth: Payer: Self-pay

## 2025-01-13 ENCOUNTER — Encounter: Payer: Self-pay | Admitting: Rehabilitative and Restorative Service Providers"

## 2025-01-13 DIAGNOSIS — M25511 Pain in right shoulder: Secondary | ICD-10-CM

## 2025-01-13 DIAGNOSIS — M6281 Muscle weakness (generalized): Secondary | ICD-10-CM

## 2025-01-13 DIAGNOSIS — R29898 Other symptoms and signs involving the musculoskeletal system: Secondary | ICD-10-CM

## 2025-01-13 NOTE — Telephone Encounter (Unsigned)
 Copied from CRM #[id number]. Topic: General - Other >> Jan 13, 2025  2:23 PM Leon Nelson wrote: Reason for CRM: Patient called in stating Pharmacy is saying solifenacin  (VESICARE ) 5 MG tablet & dicyclomine  (BENTYL ) 20 MG tablet is the same. Spoke with a physician that will be sending an authorization for the medication solifenacin  (VESICARE ) 5 MG tablet. Did not receive test strip/finger prick with the meter.

## 2025-01-13 NOTE — Therapy (Signed)
 " OUTPATIENT PHYSICAL THERAPY SHOULDER TREATMENT   Patient Name: Leon Nelson MRN: [id number] DOB:[birth year], 69 y.o., male Today's Date: 01/13/2025  END OF SESSION:  PT End of Session - 01/13/25 0710     Visit Number 11    Number of Visits 24    Date for Recertification  02/22/25    Authorization Type [employer] medicare auth required $25 copay    Authorization Time Period 12/29/24-03/29/25    Authorization - Visit Number 11    Authorization - Number of Visits 16    Progress Note Due on Visit 20    PT Start Time 0710    PT Stop Time 0800    PT Time Calculation (min) 50 min    Activity Tolerance Patient tolerated treatment well         Past Medical History:  Diagnosis Date   ABDOMINAL PAIN OTHER SPECIFIED SITE 07/20/2008   Arthritis Currently   CHEST PAIN 02/06/2011   COLONIC POLYPS, HX OF 05/26/2008   DEPRESSION 05/26/2008   DIABETES MELLITUS, TYPE II 05/26/2008   HYPERLIPIDEMIA 05/26/2008   HYPERTENSION 05/26/2008   HYPOTHYROIDISM 05/26/2008   KNEE PAIN, RIGHT, ACUTE 07/01/2008   Personal history of urinary calculi 05/26/2008   Pure hypercholesterolemia 05/19/2024   UTI'S, HX OF 05/26/2008   Past Surgical History:  Procedure Laterality Date   ANKLE SURGERY Left    JOINT REPLACEMENT  09/2009   Right Knee   REVERSE SHOULDER ARTHROPLASTY Right 11/24/2024   Procedure: ARTHROPLASTY, SHOULDER, TOTAL, REVERSE;  Surgeon: Genelle Standing, MD;  Location: [facility];  Service: Orthopedics;  Laterality: Right;   s/p right knee arthroplasty      complicated by patellar tendon rupture Jan 2011 Dr. heide   TONSILLECTOMY     Patient Active Problem List   Diagnosis Date Noted   Chronic gout without tophus 12/25/2024   Hypothyroidism 12/25/2024   Traumatic complete tear of right rotator cuff 11/24/2024   Fatty liver 07/29/2024   Joint pain 05/19/2024   Low back pain 05/19/2024   Thrombosed external hemorrhoids 05/19/2024   Work stress 04/09/2024   Coronary artery  disease due to lipid rich plaque 04/09/2024   DOE (dyspnea on exertion) 04/07/2024   Ascending aortic aneurysm 03/17/2024   Neuropathy 02/02/2024   Changes in vision 09/14/2023   Urinary frequency 01/23/2023   Constipation 01/23/2023   RUQ pain 04/24/2021   Vitamin D  deficiency 04/24/2021   Vitamin B deficiency 04/24/2021   Balance disorder 06/22/2020   Dizziness 06/22/2020   Irritable bowel syndrome, unspecified 01/15/2019   Rhabdomyolysis 01/08/2019   Left cervical radiculopathy 01/27/2018   Left hand paresthesia 01/27/2018   Hypokalemia 03/27/2017   Heart murmur previously undiagnosed 08/14/2011   Diabetes mellitus type 2 with neurological manifestations (HCC) 05/26/2008   Essential hypertension 05/26/2008   History of colonic polyps 05/26/2008   Depression, major, single episode, complete remission 05/26/2008   PCP: Dr [name] REFERRING PROVIDER: Dr [name] REFERRING DIAG: R Reverse TSA THERAPY DIAG:  Acute pain of right shoulder  Other symptoms and signs involving the musculoskeletal system  Muscle weakness (generalized)  Rationale for Evaluation and Treatment: Rehabilitation  ONSET DATE: 11/24/24  SUBJECTIVE:  SUBJECTIVE STATEMENT: Exercises are going well. [name] has some soreness but no pain. He has modified his sleeping position and is sleeping better. Numbness in the ring and little fingers but none in the past 2 days.    RTW: 01/25/25 - at desk and computer, works from home  EVAL: Patient reports that he had a total tear of the rotator cuff in his R shoulder. Patient reports that he fell 07/27/24, landing body weight on the R shoulder. He had pain and was unable to lift R UE or use arm. Diagnostic tests revealed torn rotator cuff. Patient underwent reverse total shoulder  replacement 11/24/24. Post op course has been uncomplicated. He has continued pain at 7-8/10 level. He is sleeping in the recliner. Pain free R shoulder at conclusion of treatment. Hand dominance: Right  PERTINENT HISTORY: Type II diabetes; hyperlipidemia; HTN; hypothyroidism: R knee pain; abdominal pain; depression; R TKA; ankle stabilization surgery 2020  PAIN:  Are you having pain? Yes: NPRS scale: 0/10 today - just some soreness Pain location: R shoulder Pain description: shooting; sometimes sharp  Aggravating factors: movement  Relieving factors: advil ; tylonol;   PRECAUTIONS: Shoulder R reverse TSA per protocol   WEIGHT BEARING RESTRICTIONS: Yes   FALLS:  Has patient fallen in last 6 months? Yes. 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  DIAGNOSTIC FINDINGS:  CT R shoulder 09/30/24: 1. Moderate glenohumeral joint osteoarthritis. 2. Severe acromioclavicular joint osteoarthritis. 3. Mild atrophy of the subscapularis and infraspinatus muscles.  PATIENT SURVEYS: (survey based on patient's subjective report of symptoms and activity level)  Quick DASH - 88.6/100; 88.6%      SENSATION: WFL  POSTURE: Head forward; shoulders rounded and elevated; increased thoracic kyphosis  UPPER EXTREMITY ROM: R hand, wrist, forearm, elbow WFL's    R shoulder ROM not assessed today    L UE WFL's     12/14/24: PROM R shoulder previously assessed in sitting  Passive ROM Right 12/02/24 Right  12/08/24  Right   12/14/24 Supine  Right  01/04/25 Supine  Right  01/06/25 Supine   Shoulder flexion 75 deg 95 141 126 136  Shoulder extension       Shoulder abduction (scaption) 40 80 80 90 90  Shoulder adduction       Shoulder internal rotation       Shoulder external rotation  (in scapular plane)  20  30 32 44  46  Elbow flexion       Elbow extension       Wrist flexion       Wrist extension       Wrist ulnar deviation       Wrist radial deviation       Wrist pronation       Wrist supination       (Blank rows = not tested)  UPPER EXTREMITY MMT: not assessed  MMT Right eval Left eval  Shoulder flexion    Shoulder extension    Shoulder abduction    Shoulder adduction  Shoulder internal rotation    Shoulder external rotation    Middle trapezius    Lower trapezius    Elbow flexion    Elbow extension    Wrist flexion    Wrist extension    Wrist ulnar deviation    Wrist radial deviation    Wrist pronation    Wrist supination    Grip strength (lbs)    (Blank rows = not tested)  PALPATION:   Muscular tightness upper trap   [facility] Adult PT Treatment:                                                DATE: 01/13/25 Manual Therapy: (patient sitting) STM R upper trap; pecs; periscapular musculature; biceps/anterior shoulder  PROM R shoulder flexion; scaption; ER in scapular plane   Neuromuscular re-ed: Chin tuck 5 sec x 10  Chin tuck with cervical rotation Chin tuck with lateral cervical flexion Scap squeeze 5 sec x 10; repeated with coregeous ball thoracic spine sitting  Scap squeeze in supine engaging posterior shoulder girdle musculature x 10  Supine rhythmic stabilization - circles CW/CCW; flexion/extension; horizontal ab/adduction small ranges Supine serratus punch 10 Sidelying ER available range x 10 x 2 Shoulder flexion on incline ~ 60 deg x 10 Standing shoulder flexion towel on wall x 7 to fatigue  Shoulder rolls fwd/back in sidelying   Therapeutic Activity: Shoulder  flexion step back 10 sec x 5  Supine - assisting with L hand for AAROM R shoulder flexion keeping scapulae down and back (no popping or pain reported) Shoulder extension isometric at wall 5 sec x 10 x 2 Shoulder abduction isometric at wall 5 sec x 10 x 2 Shoulder flexion isometric at wall 5 sec x 10 x 2 Isometric row green TB 3 sec x 10  Isometric ER red TB 3 sec x 10  Isometric IR red TB 3 sec x 10 Sidelying shoulder abduction with elbow flexed x 10 x 2  Sidelying shoulder abduction with elbow extended x 10  Supine shoulder flexion through partial range 90 to 120 degrees   Manual: STM R upper quarter patient sidelying and supine PROM R shoulder flexion; scaption; ER in scapular plane   Modalities: Icing at home  Self Care: Instructed in sleeping position use of sling for sleeping and propping/supporting UE Continue with sling for comfort and UE support (only using at night now per pt report)  Preferred position  R UE supported at side on pillow when sitting for prolonged periods of time   [facility] Adult PT Treatment:                                                DATE: 01/11/25 Manual Therapy: (patient sitting) STM R upper trap; pecs; periscapular musculature; biceps/anterior shoulder  PROM R shoulder flexion; scaption; ER in scapular plane   Neuromuscular re-ed: Chin tuck 5 sec x 10  Chin tuck with cervical rotation Chin tuck with lateral cervical flexion Scap squeeze 5 sec x 10; repeated with coregeous ball thoracic spine  Prolonged scap squeeze with chest lift to engage posterior shoulder girdle  Scap squeeze in supine engaging posterior shoulder girdle musculature  Supine rhythmic stabilization - circles CW/CCW; flexion/extension; horizontal ab/adduction  small ranges Supine serratus punch 5 x 2 Sidelying ER available range x 10  Shoulder flexion on incline ~ 60 deg x 10 x 2   Therapeutic Activity: Shoulder flexion step back 10 sec x 5  Supine - assisting with L hand for  AAROM R shoulder flexion keeping scapulae down and back (no popping or pain reported) Shoulder extension isometric at wall 5 sec x 10 x 2 Shoulder abduction isometric at wall 5 sec x 10 x 2 Shoulder flexion isometric at wall 5 sec x 10 x 2 Isometric row green TB 3 sec x 10  Sidelying shoulder abduction with elbow flexed x 10 x 2  Sidelying shoulder abduction with elbow extended with PT x 4 Supine shoulder flexion through partial range 90 to 120 degrees   Modalities: Icing at home  Self Care: Instructed in sleeping position use of sling for sleeping - may return to sleep in chair as needed if there is flare up of pain Continue with sling for comfort and UE support - discussed wearing sling at home to address flare up of shoulder pain  Preferred position  R UE supported at side on pillow when sitting for prolonged periods of time   PATIENT EDUCATION: Education details: POC;HEP  Person educated: Patient Education method: Explanation, Demonstration, Tactile cues, Verbal cues, and Handouts Education comprehension: verbalized understanding, returned demonstration, verbal cues required, tactile cues required, and needs further education  HOME EXERCISE PROGRAM: Access Code: [email or web address] URL: [email or web address] Date: 01/13/2025 Prepared by: Kayvon Mo  Program Notes -sitting with chest up and shoulder down and back -pretend to push arm forward, out to side, and backward without moving arm-3 seconds x 10 -2 x day   Exercises - Seated Scapular Retraction  - 2 x daily - 7 x weekly - 1-2 sets - 10 reps - 10 sec  hold - Seated Cervical Retraction  - 2 x daily - 7 x weekly - 1-2 sets - 5-10 reps - 10 sec  hold - Circular Shoulder Pendulum with Table Support  - 3-4 x daily - 7 x weekly - 1 sets - 20-30 reps - Standing 'L' Stretch at Counter  - 2 x daily - 7 x weekly - 1 sets - 5-10 reps - 5 sec  hold - Seated Shoulder External Rotation AAROM with Dowel  - 2 x daily - 7 x weekly - 1  sets - 5-10 reps - 5 sec  hold - Supine Shoulder Flexion AAROM with Hands Clasped  - 2 x daily - 7 x weekly - 1 sets - 5-10 reps - 2-3 sec  hold - Standing Forward-Bent Shoulder Extension  - 1 x daily - 7 x weekly - 1 sets - 5-10 reps - 3 sec  hold - Standing Isometric Shoulder Extension with Doorway - Arm Bent  - 1-2 x daily - 7 x weekly - 1 sets - 5-10 reps - 5 sec  hold - Isometric Shoulder Abduction at Wall  - 1-2 x daily - 7 x weekly - 1 sets - 5-10 reps - 5 sec  hold - Isometric Shoulder Flexion at Wall  - 1-2 x daily - 7 x weekly - 1 sets - 5-10 reps - 5 sec  hold - Supine Shoulder Rhythmic Stabilization- Horizontal Abduction/Adduction  - 1 x daily - 7 x weekly - 1 sets - 5-10 reps - Sidelying Shoulder External Rotation  - 2 x daily - 7 x weekly - 1 sets - 10 reps - 3 sec  hold -  Sidelying Shoulder Abduction Palm Forward  - 1 x daily - 7 x weekly - 1 sets - 5-10 reps - 3 sec  hold - Standing Shoulder Row Reactive Isometric  - 2 x daily - 7 x weekly - 1 sets - 10 reps - 30-45 sec  hold - Shoulder Flexion Wall Slide with Towel  - 2 x daily - 7 x weekly - 1 sets - 5-10 reps - 3-5 sec  hold - Shoulder External Rotation Reactive Isometrics  - 1 x daily - 7 x weekly - 1 sets - 10 reps - 3-5 sec  hold - Shoulder Internal Rotation Reactive Isometrics  - 2 x daily - 7 x weekly - 1 sets - 10 reps - 3-5 sec  hold - Standing Shoulder Flexion Reactive Isometric  - 2 x daily - 7 x weekly - 1 sets - 10 reps - 3-5 sec  hold  Patient Education - Scar Massage  ASSESSMENT:  CLINICAL IMPRESSION: Patient is 7 weeks 1 day post reverse TSA. Dontario reports minimal shoulder pain but some continued soreness. He is sleeping better and working on the HEP without difficulty. Working on chief of staff for scapular alignment and control. Patient continues to demonstrate increased thoracic kyphosis and sits/stands with forward rounded posture. Progressing per protocol. Slightly slower progression due to post  op pain and episodic flare ups of significant pain earlier in rehab process.      He returns today with significant increased pain in the R shoulder following sleeping in adducted shoulder position. Pain has persisted throughout the day. He presents with rounded shoulder in adducted and flexed posture with rm resting across his body. Treatment address posture and alignment with gentle movement of scapula and shoulder. Using movement of cervical spine and R UE to improve tissue release R shoulder girdle. Patient tolerated treatment well with report of being pain free at end of treatment.  And encouraged patient to use pain meds on a regular basis, modify posture for sitting; keep UE supported at home.  Eval: Patient is a 69 y.o. male who was seen today for physical therapy evaluation and treatment s/p R TSA 11/24/24 following a fall with injury 07/27/24. He has been out of the sling since ~ the second post op day per MD instructions. Discussed supporting R UE when out of the sling to allow musculature of R shoulder girdle to rest and heal without need to support the weight of the UE. Patient presents with rounded posture and alignent through the R shoulder girdle and thoracic spine; limited ROM, strength, function of R UE following reverse TSA. Of concern, he continues to report pain at 7-8/10 on a daily basis. Patient has decreased functional activities and ADL's. Tyrion will benefit from PT to address deficits and progress with shoulder rehab.   OBJECTIVE IMPAIRMENTS: decreased activity tolerance, decreased mobility, decreased ROM, decreased strength, increased muscle spasms, impaired UE functional use, improper body mechanics, postural dysfunction, and pain.   GOALS: Goals reviewed with patient? Yes  SHORT TERM GOALS: Target date: 01/11/2025  Independent in initial HEP  Baseline: Goal status: met  2.  Improve posture and alignment through shoulder girdle with patient demonstrating activation of  posterior shoulder girdle musculature to improve shoulder function  Baseline:  Goal status: met 3.  P/AAROM R shoulder to 130 deg elevation and 30 deg ER in scapular plane Baseline:  Goal status: INITIAL  LONG TERM GOALS: Target date: 02/22/2025  Progress shoulder rehab per protocol or as directed by MD  Baseline:  Goal status: on gong   2.  PROM 145 deg elevation and 40 deg ER in scapular plane; functional IR to L! Baseline:  Goal status: on going  3.  Decrease pain to 0/10 to no more than 3/10  Baseline:  Goal status: partially met  4.  Return to functional activities using R UE including return to work at desk/computer/phone  Baseline:  Goal status: on going  5.  Improve Quick DASH score by 10% indicating improved functional activities  Baseline:  Goal status: on going  6.  Independent in advanced HEP  Baseline:  Goal status: on going PLAN:  PT FREQUENCY: 2x/week  PT DURATION: 12 weeks  PLANNED INTERVENTIONS: 97164- PT Re-evaluation, 97110-Therapeutic exercises, 97530- Therapeutic activity, 97112- Neuromuscular re-education, 97535- Self Care, [id number]- Manual therapy, [phone]- Aquatic Therapy, Patient/Family education, and Taping  PLAN FOR NEXT SESSION: review and progress exercise per protocol; manual work and modalities as indicated; progress rehab per protocol and/or as ordered;  continued with exercises per protocol    Jahne Krukowski SHAUNNA Baptist, PT 01/13/2025, 7:11 AM  "

## 2025-01-14 ENCOUNTER — Other Ambulatory Visit: Payer: Self-pay

## 2025-01-14 DIAGNOSIS — E1149 Type 2 diabetes mellitus with other diabetic neurological complication: Secondary | ICD-10-CM

## 2025-01-14 MED ORDER — ACCU-CHEK GUIDE TEST VI STRP: ORAL_STRIP | 2 refills | Status: AC

## 2025-01-14 MED ORDER — ONETOUCH DELICA PLUS LANCET30G MISC: 2 refills | Status: AC

## 2025-01-18 ENCOUNTER — Ambulatory Visit: Admitting: Rehabilitative and Restorative Service Providers"

## 2025-01-20 ENCOUNTER — Encounter: Payer: Self-pay | Admitting: Rehabilitative and Restorative Service Providers"

## 2025-01-20 ENCOUNTER — Ambulatory Visit: Admitting: Rehabilitative and Restorative Service Providers"

## 2025-01-20 DIAGNOSIS — M25511 Pain in right shoulder: Secondary | ICD-10-CM

## 2025-01-20 DIAGNOSIS — R29898 Other symptoms and signs involving the musculoskeletal system: Secondary | ICD-10-CM

## 2025-01-20 DIAGNOSIS — M6281 Muscle weakness (generalized): Secondary | ICD-10-CM

## 2025-01-20 NOTE — Therapy (Signed)
 " OUTPATIENT PHYSICAL THERAPY SHOULDER TREATMENT   Patient Name: Leon Nelson MRN: [id number] DOB:February 10, 1956, 69 y.o., male Today's Date: 01/20/2025  END OF SESSION:  PT End of Session - 01/20/25 0715     Visit Number 12    Number of Visits 24    Date for Recertification  02/22/25    Authorization Type [employer] medicare auth required $25 copay    Authorization Time Period 12/29/24-03/29/25    Authorization - Visit Number 12    Authorization - Number of Visits 16    Progress Note Due on Visit 20    PT Start Time 0712    PT Stop Time 0800    PT Time Calculation (min) 48 min    Activity Tolerance Patient tolerated treatment well         Past Medical History:  Diagnosis Date   ABDOMINAL PAIN OTHER SPECIFIED SITE 07/20/2008   Arthritis Currently   CHEST PAIN 02/06/2011   COLONIC POLYPS, HX OF 05/26/2008   DEPRESSION 05/26/2008   DIABETES MELLITUS, TYPE II 05/26/2008   HYPERLIPIDEMIA 05/26/2008   HYPERTENSION 05/26/2008   HYPOTHYROIDISM 05/26/2008   KNEE PAIN, RIGHT, ACUTE 07/01/2008   Personal history of urinary calculi 05/26/2008   Pure hypercholesterolemia 05/19/2024   UTI'S, HX OF 05/26/2008   Past Surgical History:  Procedure Laterality Date   ANKLE SURGERY Left    JOINT REPLACEMENT  09/2009   Right Knee   REVERSE SHOULDER ARTHROPLASTY Right 11/24/2024   Procedure: ARTHROPLASTY, SHOULDER, TOTAL, REVERSE;  Surgeon: Genelle Standing, MD;  Location: [facility];  Service: Orthopedics;  Laterality: Right;   s/p right knee arthroplasty      complicated by patellar tendon rupture Jan 2011 Dr. heide   TONSILLECTOMY     Patient Active Problem List   Diagnosis Date Noted   Chronic gout without tophus 12/25/2024   Hypothyroidism 12/25/2024   Traumatic complete tear of right rotator cuff 11/24/2024   Fatty liver 07/29/2024   Joint pain 05/19/2024   Low back pain 05/19/2024   Thrombosed external hemorrhoids 05/19/2024   Work stress 04/09/2024   Coronary artery  disease due to lipid rich plaque 04/09/2024   DOE (dyspnea on exertion) 04/07/2024   Ascending aortic aneurysm 03/17/2024   Neuropathy 02/02/2024   Changes in vision 09/14/2023   Urinary frequency 01/23/2023   Constipation 01/23/2023   RUQ pain 04/24/2021   Vitamin D  deficiency 04/24/2021   Vitamin B deficiency 04/24/2021   Balance disorder 06/22/2020   Dizziness 06/22/2020   Irritable bowel syndrome, unspecified 01/15/2019   Rhabdomyolysis 01/08/2019   Left cervical radiculopathy 01/27/2018   Left hand paresthesia 01/27/2018   Hypokalemia 03/27/2017   Heart murmur previously undiagnosed 08/14/2011   Diabetes mellitus type 2 with neurological manifestations (HCC) 05/26/2008   Essential hypertension 05/26/2008   History of colonic polyps 05/26/2008   Depression, major, single episode, complete remission 05/26/2008   PCP: Dr [name] REFERRING PROVIDER: Dr [name] REFERRING DIAG: R Reverse TSA THERAPY DIAG:  Acute pain of right shoulder  Other symptoms and signs involving the musculoskeletal system  Muscle weakness (generalized)  Rationale for Evaluation and Treatment: Rehabilitation  ONSET DATE: 11/24/24  SUBJECTIVE:  SUBJECTIVE STATEMENT: Having the same soreness in the top of the shoulder. He has some pain with the green strap. His dog ate his exercises and he is going from memory to do the exercises. He has modified his sleeping position and is sleeping better. Numbness in the ring and little fingers comes and goes over the last week.     RTW: 01/25/25 - at desk and computer, works from home  EVAL: Patient reports that he had a total tear of the rotator cuff in his R shoulder. Patient reports that he fell 07/27/24, landing body weight on the R shoulder. He had pain and was unable to lift  R UE or use arm. Diagnostic tests revealed torn rotator cuff. Patient underwent reverse total shoulder replacement 11/24/24. Post op course has been uncomplicated. He has continued pain at 7-8/10 level. He is sleeping in the recliner. Pain free R shoulder at conclusion of treatment. Hand dominance: Right  PERTINENT HISTORY: Type II diabetes; hyperlipidemia; HTN; hypothyroidism: R knee pain; abdominal pain; depression; R TKA; ankle stabilization surgery 2020  PAIN:  Are you having pain? Yes: NPRS scale: 0/10 today - just some soreness Pain location: R shoulder Pain description: shooting; sometimes sharp  Aggravating factors: movement  Relieving factors: advil ; tylonol;   PRECAUTIONS: Shoulder R reverse TSA per protocol   WEIGHT BEARING RESTRICTIONS: Yes   FALLS:  Has patient fallen in last 6 months? Yes. 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  DIAGNOSTIC FINDINGS:  CT R shoulder 09/30/24: 1. Moderate glenohumeral joint osteoarthritis. 2. Severe acromioclavicular joint osteoarthritis. 3. Mild atrophy of the subscapularis and infraspinatus muscles.  PATIENT SURVEYS: (survey based on patient's subjective report of symptoms and activity level)  Quick DASH - 88.6/100; 88.6%      SENSATION: WFL  POSTURE: Head forward; shoulders rounded and elevated; increased thoracic kyphosis  UPPER EXTREMITY ROM: R hand, wrist, forearm, elbow WFL's    R shoulder ROM not assessed today    L UE WFL's     12/14/24: PROM R  shoulder previously assessed in sitting  Passive ROM Right 12/02/24 Right  12/08/24  Right  12/14/24 Supine  Right  01/04/25 Supine  Right  01/06/25 Supine   Shoulder flexion 75 deg 95 141 126 136  Shoulder extension       Shoulder abduction (scaption) 40 80 80 90 90  Shoulder adduction       Shoulder internal rotation       Shoulder external rotation  (in scapular plane)  20  30 32 44  46  Elbow flexion       Elbow extension       Wrist flexion       Wrist extension       Wrist ulnar deviation       Wrist radial deviation       Wrist pronation       Wrist supination       (Blank rows = not tested)  UPPER EXTREMITY MMT: not assessed  MMT Right  eval Left eval  Shoulder flexion    Shoulder extension    Shoulder abduction    Shoulder adduction    Shoulder internal rotation    Shoulder external rotation    Middle trapezius    Lower trapezius    Elbow flexion    Elbow extension    Wrist flexion    Wrist extension    Wrist ulnar deviation    Wrist radial deviation    Wrist pronation    Wrist supination    Grip strength (lbs)    (Blank rows = not tested)  PALPATION:   Muscular tightness upper trap    [facility] Adult PT Treatment:                                                DATE: 01/20/25  Neuromuscular re-ed: Chin tuck 5 sec x 10  Chin tuck with cervical rotation Chin tuck with lateral cervical flexion Scap squeeze 5 sec x 10; repeated with coregeous ball thoracic spine sitting  Scap squeeze in supine engaging posterior shoulder girdle musculature x 10  Supine rhythmic stabilization - circles CW/CCW; flexion/extension; horizontal ab/adduction small ranges Supine serratus punch 10 Sidelying ER available range x 10 x 2 Standing shoulder flexion towel on wall x 7 to fatigue  Shoulder rolls fwd/back in sidelying   Therapeutic Activity:  Supine - assisting with L hand for AAROM R shoulder flexion keeping scapulae down and back (no popping or pain  reported) Shoulder extension isometric at wall 5 sec x 10 x 2 Shoulder abduction isometric at wall 5 sec x 10 x 2 Shoulder flexion isometric at wall 5 sec x 10 x 2 Isometric row red TB 3 sec x 10  Isometric ER red TB 3 sec x 10  Isometric IR red TB 3 sec x 10 isometric flexion step out 10 sec x 5 red TB Sidelying shoulder abduction with elbow flexed x 10 x 2  Sidelying shoulder abduction with elbow extended x 10 towel under arm partial range  Sidelying ER towel under arm x 10 x 2  Supine shoulder flexion through partial range 90 to 120 degrees   Pulley flexion 10 sec x 5 reps  Manual: STM R upper quarter patient supine PROM R shoulder flexion; scaption; ER in scapular plane   Modalities: Icing at home  Self Care: Instructed in sleeping position use of sling for sleeping and propping/supporting UE Continue with sling for comfort and UE support (only using at night now per pt report)  Preferred position  R UE supported at side on pillow when sitting for prolonged periods of time  [facility] Adult PT Treatment:                                                DATE: 01/13/25 Manual Therapy: (patient sitting) STM R upper trap; pecs; periscapular musculature; biceps/anterior shoulder  PROM R shoulder flexion; scaption; ER in scapular plane   Neuromuscular re-ed: Chin tuck 5 sec x 10  Chin tuck with cervical rotation Chin tuck with lateral cervical flexion Scap squeeze 5 sec x 10; repeated with coregeous ball thoracic spine sitting  Scap squeeze in supine engaging posterior shoulder girdle musculature x 10  Supine rhythmic stabilization -  circles CW/CCW; flexion/extension; horizontal ab/adduction small ranges Supine serratus punch 10 Sidelying ER available range x 10 x 2 Shoulder flexion on incline ~ 60 deg x 10 Standing shoulder flexion towel on wall x 7 to fatigue  Shoulder rolls fwd/back in sidelying   Therapeutic Activity: Shoulder flexion step back 10 sec x 5  Supine - assisting with  L hand for AAROM R shoulder flexion keeping scapulae down and back (no popping or pain reported) Shoulder extension isometric at wall 5 sec x 10 x 2 Shoulder abduction isometric at wall 5 sec x 10 x 2 Shoulder flexion isometric at wall 5 sec x 10 x 2 Isometric row green TB 3 sec x 10  Isometric ER red TB 3 sec x 10  Isometric IR red TB 3 sec x 10 Sidelying shoulder abduction with elbow flexed x 10 x 2  Sidelying shoulder abduction with elbow extended x 10  Supine shoulder flexion through partial range 90 to 120 degrees   Manual: STM R upper quarter patient sidelying and supine PROM R shoulder flexion; scaption; ER in scapular plane   Modalities: Icing at home  Self Care: Instructed in sleeping position use of sling for sleeping and propping/supporting UE Continue with sling for comfort and UE support (only using at night now per pt report)  Preferred position  R UE supported at side on pillow when sitting for prolonged periods of time   PATIENT EDUCATION: Education details: POC;HEP  Person educated: Patient Education method: Explanation, Demonstration, Tactile cues, Verbal cues, and Handouts Education comprehension: verbalized understanding, returned demonstration, verbal cues required, tactile cues required, and needs further education  HOME EXERCISE PROGRAM: Access Code: [email or web address] URL: [email or web address] Date: 01/20/2025 Prepared by: Shantell Belongia  Program Notes -sitting with chest up and shoulder down and back -pretend to push arm forward, out to side, and backward without moving arm-3 seconds x 10 -2 x day   Exercises - Seated Scapular Retraction  - 2 x daily - 7 x weekly - 1-2 sets - 10 reps - 10 sec  hold - Seated Cervical Retraction  - 2 x daily - 7 x weekly - 1-2 sets - 5-10 reps - 10 sec  hold - Circular Shoulder Pendulum with Table Support  - 3-4 x daily - 7 x weekly - 1 sets - 20-30 reps - Standing 'L' Stretch at Counter  - 2 x daily - 7 x weekly - 1  sets - 5-10 reps - 5 sec  hold - Seated Shoulder External Rotation AAROM with Dowel  - 2 x daily - 7 x weekly - 1 sets - 5-10 reps - 5 sec  hold - Supine Shoulder Flexion AAROM with Hands Clasped  - 2 x daily - 7 x weekly - 1 sets - 5-10 reps - 2-3 sec  hold - Standing Forward-Bent Shoulder Extension  - 1 x daily - 7 x weekly - 1 sets - 5-10 reps - 3 sec  hold - Standing Isometric Shoulder Extension with Doorway - Arm Bent  - 1-2 x daily - 7 x weekly - 1 sets - 5-10 reps - 5 sec  hold - Isometric Shoulder Abduction at Wall  - 1-2 x daily - 7 x weekly - 1 sets - 5-10 reps - 5 sec  hold - Isometric Shoulder Flexion at Wall  - 1-2 x daily - 7 x weekly - 1 sets - 5-10 reps - 5 sec  hold - Supine Shoulder Rhythmic Stabilization- Horizontal Abduction/Adduction  - 1 x  daily - 7 x weekly - 1 sets - 5-10 reps - Sidelying Shoulder External Rotation  - 2 x daily - 7 x weekly - 1 sets - 10 reps - 3 sec  hold - Sidelying Shoulder Abduction Palm Forward  - 1 x daily - 7 x weekly - 1 sets - 5-10 reps - 3 sec  hold - Standing Shoulder Row Reactive Isometric  - 2 x daily - 7 x weekly - 1 sets - 10 reps - 30-45 sec  hold - Shoulder Flexion Wall Slide with Towel  - 2 x daily - 7 x weekly - 1 sets - 5-10 reps - 3-5 sec  hold - Shoulder External Rotation Reactive Isometrics  - 1 x daily - 7 x weekly - 1 sets - 10 reps - 3-5 sec  hold - Shoulder Internal Rotation Reactive Isometrics  - 2 x daily - 7 x weekly - 1 sets - 10 reps - 3-5 sec  hold - Standing Shoulder Flexion Reactive Isometric  - 2 x daily - 7 x weekly - 1 sets - 10 reps - 3-5 sec  hold - Seated Shoulder Flexion AAROM with Pulley Behind  - 1 x daily - 7 x weekly - 1 sets - 5-10 reps - 10 sec  hold  Patient Education - Scar Massage  ASSESSMENT:  CLINICAL IMPRESSION: Patient is 8 weeks 1 day post reverse TSA. [place] reports some pain with the green TB exercise otherwise he has minimal shoulder pain. He does have some continued soreness at the deltoid  area. Decreased TB to use red for all exercises. Added pulley flexion to address ROM.  Working on chief of staff for scapular alignment and control. Patient continues to demonstrate increased thoracic kyphosis and sits/stands with forward rounded posture. Progressing per protocol. Slightly slower progression due to post op pain and episodic flare ups of significant pain earlier in rehab process.      He returns today with significant increased pain in the R shoulder following sleeping in adducted shoulder position. Pain has persisted throughout the day. He presents with rounded shoulder in adducted and flexed posture with rm resting across his body. Treatment address posture and alignment with gentle movement of scapula and shoulder. Using movement of cervical spine and R UE to improve tissue release R shoulder girdle. Patient tolerated treatment well with report of being pain free at end of treatment.  And encouraged patient to use pain meds on a regular basis, modify posture for sitting; keep UE supported at home.  Eval: Patient is a 69 y.o. male who was seen today for physical therapy evaluation and treatment s/p R TSA 11/24/24 following a fall with injury 07/27/24. He has been out of the sling since ~ the second post op day per MD instructions. Discussed supporting R UE when out of the sling to allow musculature of R shoulder girdle to rest and heal without need to support the weight of the UE. Patient presents with rounded posture and alignent through the R shoulder girdle and thoracic spine; limited ROM, strength, function of R UE following reverse TSA. Of concern, he continues to report pain at 7-8/10 on a daily basis. Patient has decreased functional activities and ADL's. [place] will benefit from PT to address deficits and progress with shoulder rehab.   OBJECTIVE IMPAIRMENTS: decreased activity tolerance, decreased mobility, decreased ROM, decreased strength, increased muscle spasms, impaired  UE functional use, improper body mechanics, postural dysfunction, and pain.   GOALS: Goals reviewed with patient? Yes  SHORT TERM GOALS: Target date: 01/11/2025  Independent in initial HEP  Baseline: Goal status: met  2.  Improve posture and alignment through shoulder girdle with patient demonstrating activation of posterior shoulder girdle musculature to improve shoulder function  Baseline:  Goal status: met 3.  P/AAROM R shoulder to 130 deg elevation and 30 deg ER in scapular plane Baseline:  Goal status: INITIAL  LONG TERM GOALS: Target date: 02/22/2025  Progress shoulder rehab per protocol or as directed by MD  Baseline:  Goal status: on gong   2.  PROM 145 deg elevation and 40 deg ER in scapular plane; functional IR to L! Baseline:  Goal status: on going  3.  Decrease pain to 0/10 to no more than 3/10  Baseline:  Goal status: partially met  4.  Return to functional activities using R UE including return to work at desk/computer/phone  Baseline:  Goal status: on going  5.  Improve Quick DASH score by 10% indicating improved functional activities  Baseline:  Goal status: on going  6.  Independent in advanced HEP  Baseline:  Goal status: on going PLAN:  PT FREQUENCY: 2x/week  PT DURATION: 12 weeks  PLANNED INTERVENTIONS: 97164- PT Re-evaluation, 97110-Therapeutic exercises, 97530- Therapeutic activity, 97112- Neuromuscular re-education, 97535- Self Care, 02859- Manual therapy, [id number]- Aquatic Therapy, Patient/Family education, and Taping  PLAN FOR NEXT SESSION: review and progress exercise per protocol; manual work and modalities as indicated; progress rehab per protocol and/or as ordered;  continued with exercises per protocol    Harlee Pursifull SHAUNNA Baptist, PT 01/20/2025, 8:04 AM  "

## 2025-01-20 NOTE — Telephone Encounter (Unsigned)
 Copied from CRM #[id number]. Topic: General - Other >> Jan 13, 2025  2:23 PM Deaijah H wrote: Reason for CRM: Patient called in stating Pharmacy is saying solifenacin  (VESICARE ) 5 MG tablet & dicyclomine  (BENTYL ) 20 MG tablet is the same. Spoke with a physician that will be sending an authorization for the medication solifenacin  (VESICARE ) 5 MG tablet. Did not receive test strip/finger prick with the meter. >> Jan 20, 2025  1:00 PM Tysheama G wrote: Patient is calling regarding the his medication solifenacin  (VESICARE ) 5 MG tablet ; advised him a PA was sent on 1/22 but he stated they canceled it so we will need to send it again . Advised him it takes 7-10 business days. And he has only 6days left on the medication.

## 2025-01-22 ENCOUNTER — Telehealth: Payer: Self-pay

## 2025-01-22 ENCOUNTER — Ambulatory Visit

## 2025-01-22 ENCOUNTER — Other Ambulatory Visit (HOSPITAL_COMMUNITY): Payer: Self-pay

## 2025-01-22 DIAGNOSIS — M6281 Muscle weakness (generalized): Secondary | ICD-10-CM

## 2025-01-22 DIAGNOSIS — M25511 Pain in right shoulder: Secondary | ICD-10-CM | POA: Diagnosis not present

## 2025-01-22 DIAGNOSIS — R29898 Other symptoms and signs involving the musculoskeletal system: Secondary | ICD-10-CM

## 2025-01-22 NOTE — Telephone Encounter (Signed)
 Pharmacy Patient Advocate Encounter   Received notification from Physician's Office that prior authorization for Vesicare  is required/requested.   Insurance verification completed.   The patient is insured through [employer].   Per latent someone has already started PA

## 2025-01-22 NOTE — Therapy (Signed)
 " OUTPATIENT PHYSICAL THERAPY SHOULDER TREATMENT   Patient Name: Leon Nelson MRN: [id number] DOB:[birth year], 69 y.o., male Today's Date: 01/22/2025  END OF SESSION:  PT End of Session - 01/22/25 0840     Visit Number 13    Number of Visits 24    Date for Recertification  02/22/25    Authorization Type [employer] medicare auth required $25 copay    Authorization Time Period 12/29/24-03/29/25    Authorization - Visit Number 13    Authorization - Number of Visits 16    PT Start Time 0840    PT Stop Time 0920    PT Time Calculation (min) 40 min    Activity Tolerance Patient tolerated treatment well    Behavior During Therapy [facility] for tasks assessed/performed         Past Medical History:  Diagnosis Date   ABDOMINAL PAIN OTHER SPECIFIED SITE 07/20/2008   Arthritis Currently   CHEST PAIN 02/06/2011   COLONIC POLYPS, HX OF 05/26/2008   DEPRESSION 05/26/2008   DIABETES MELLITUS, TYPE II 05/26/2008   HYPERLIPIDEMIA 05/26/2008   HYPERTENSION 05/26/2008   HYPOTHYROIDISM 05/26/2008   KNEE PAIN, RIGHT, ACUTE 07/01/2008   Personal history of urinary calculi 05/26/2008   Pure hypercholesterolemia 05/19/2024   UTI'S, HX OF 05/26/2008   Past Surgical History:  Procedure Laterality Date   ANKLE SURGERY Left    JOINT REPLACEMENT  09/2009   Right Knee   REVERSE SHOULDER ARTHROPLASTY Right 11/24/2024   Procedure: ARTHROPLASTY, SHOULDER, TOTAL, REVERSE;  Surgeon: Genelle Standing, MD;  Location: [facility];  Service: Orthopedics;  Laterality: Right;   s/p right knee arthroplasty      complicated by patellar tendon rupture Jan 2011 Dr. heide   TONSILLECTOMY     Patient Active Problem List   Diagnosis Date Noted   Chronic gout without tophus 12/25/2024   Hypothyroidism 12/25/2024   Traumatic complete tear of right rotator cuff 11/24/2024   Fatty liver 07/29/2024   Joint pain 05/19/2024   Low back pain 05/19/2024   Thrombosed external hemorrhoids 05/19/2024   Work stress  04/09/2024   Coronary artery disease due to lipid rich plaque 04/09/2024   DOE (dyspnea on exertion) 04/07/2024   Ascending aortic aneurysm 03/17/2024   Neuropathy 02/02/2024   Changes in vision 09/14/2023   Urinary frequency 01/23/2023   Constipation 01/23/2023   RUQ pain 04/24/2021   Vitamin D  deficiency 04/24/2021   Vitamin B deficiency 04/24/2021   Balance disorder 06/22/2020   Dizziness 06/22/2020   Irritable bowel syndrome, unspecified 01/15/2019   Rhabdomyolysis 01/08/2019   Left cervical radiculopathy 01/27/2018   Left hand paresthesia 01/27/2018   Hypokalemia 03/27/2017   Heart murmur previously undiagnosed 08/14/2011   Diabetes mellitus type 2 with neurological manifestations (HCC) 05/26/2008   Essential hypertension 05/26/2008   History of colonic polyps 05/26/2008   Depression, major, single episode, complete remission 05/26/2008   PCP: Dr [name] REFERRING PROVIDER: Dr [name] REFERRING DIAG: R Reverse TSA THERAPY DIAG:  Acute pain of right shoulder  Other symptoms and signs involving the musculoskeletal system  Muscle weakness (generalized)  Rationale for Evaluation and Treatment: Rehabilitation  ONSET DATE: 11/24/24  SUBJECTIVE:  SUBJECTIVE STATEMENT: Patient reports 1/10 soreness today but no pain; states he thinks he is sore from sleeping on it.    RTW: 01/25/25 - at desk and computer, works from home  EVAL: Patient reports that he had a total tear of the rotator cuff in his R shoulder. Patient reports that he fell 07/27/24, landing body weight on the R shoulder. He had pain and was unable to lift R UE or use arm. Diagnostic tests revealed torn rotator cuff. Patient underwent reverse total shoulder replacement 11/24/24. Post op course has been uncomplicated. He has  continued pain at 7-8/10 level. He is sleeping in the recliner. Pain free R shoulder at conclusion of treatment. Hand dominance: Right  PERTINENT HISTORY: Type II diabetes; hyperlipidemia; HTN; hypothyroidism: R knee pain; abdominal pain; depression; R TKA; ankle stabilization surgery 2020  PAIN:  Are you having pain? Yes: NPRS scale: 0-1/10 soreness Pain location: R shoulder Pain description: shooting; sometimes sharp  Aggravating factors: movement  Relieving factors: advil ; tylonol;   PRECAUTIONS: Shoulder R reverse TSA per protocol   WEIGHT BEARING RESTRICTIONS: Yes   FALLS:  Has patient fallen in last 6 months? Yes. 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  DIAGNOSTIC FINDINGS:  CT R shoulder 09/30/24: 1. Moderate glenohumeral joint osteoarthritis. 2. Severe acromioclavicular joint osteoarthritis. 3. Mild atrophy of the subscapularis and infraspinatus muscles.  PATIENT SURVEYS: (survey based on patient's subjective report of symptoms and activity level)  Quick DASH - 88.6/100; 88.6%      SENSATION: WFL  POSTURE: Head forward; shoulders rounded and elevated; increased thoracic kyphosis  UPPER EXTREMITY ROM: R hand, wrist, forearm, elbow WFL's    R shoulder ROM not assessed today    L UE WFL's     12/14/24: PROM R shoulder previously assessed in sitting  Passive ROM Right 12/02/24 Right  12/08/24  Right  12/14/24 Supine  Right  01/04/25 Supine  Right  01/06/25 Supine   Shoulder flexion  75 deg 95 141 126 136  Shoulder extension       Shoulder abduction (scaption) 40 80 80 90 90  Shoulder adduction       Shoulder internal rotation       Shoulder external rotation  (in scapular plane)  20  30 32 44  46  Elbow flexion       Elbow extension       Wrist flexion       Wrist extension       Wrist ulnar deviation       Wrist radial deviation       Wrist pronation       Wrist supination       (Blank rows = not tested)  UPPER EXTREMITY MMT: not assessed  MMT Right eval Left eval  Shoulder flexion    Shoulder extension    Shoulder abduction    Shoulder adduction    Shoulder internal rotation    Shoulder external rotation    Middle trapezius    Lower trapezius  Elbow flexion    Elbow extension    Wrist flexion    Wrist extension    Wrist ulnar deviation    Wrist radial deviation    Wrist pronation    Wrist supination    Grip strength (lbs)    (Blank rows = not tested)  PALPATION:   Muscular tightness upper trap     [facility] Adult PT Treatment:                                                DATE: 01/22/2025 Therapeutic Exercise: Standing L stretch at counter Pulleys shoulder flexion x 2 min Shoulder bkwd circles Pendulums Neuromuscular re-ed: Supine: Supine serratus punch x 10 Supine rhythmic stabilization - circles CW/CCW; flexion/extension; horizontal ab/adduction small ranges Side Lying: Shoulder abduction with elbow flexed 2x10  Shoulder abduction with elbow extended x 12 towel under arm partial range  ER towel under arm 2x10 Isometric row green TB 10 x 3 sec Isometric ER red TB 10 x 3 sec Isometric IR green TB 10 x 3 sec Isometric flexion step out red TB 10 x 3 sec Therapeutic Activity: Shoulder isometrics --> flexion, abduction, extension 10 x 5 sec each Supine cervical retraction Supine AAROM shoulder flexion with hands clasped x10 Shoulder flexion AROM partial range x 6 Standing shoulder flexion towel on wall --> discontinued d/t anterior  shoulder pain    [facility] Adult PT Treatment:                                                DATE: 01/20/25  Neuromuscular re-ed: Chin tuck 5 sec x 10  Chin tuck with cervical rotation Chin tuck with lateral cervical flexion Scap squeeze 5 sec x 10; repeated with coregeous ball thoracic spine sitting  Scap squeeze in supine engaging posterior shoulder girdle musculature x 10  Supine rhythmic stabilization - circles CW/CCW; flexion/extension; horizontal ab/adduction small ranges Supine serratus punch 10 Sidelying ER available range x 10 x 2 Standing shoulder flexion towel on wall x 7 to fatigue  Shoulder rolls fwd/back in sidelying   Therapeutic Activity:  Supine - assisting with L hand for AAROM R shoulder flexion keeping scapulae down and back (no popping or pain reported) Shoulder extension isometric at wall 5 sec x 10 x 2 Shoulder abduction isometric at wall 5 sec x 10 x 2 Shoulder flexion isometric at wall 5 sec x 10 x 2 Isometric row red TB 3 sec x 10  Isometric ER red TB 3 sec x 10  Isometric IR red TB 3 sec x 10 isometric flexion step out 10 sec x 5 red TB Sidelying shoulder abduction with elbow flexed x 10 x 2  Sidelying shoulder abduction with elbow extended x 10 towel under arm partial range  Sidelying ER towel under arm x 10 x 2  Supine shoulder flexion through partial range 90 to 120 degrees   Pulley flexion 10 sec x 5 reps  Manual: STM R upper quarter patient supine PROM R shoulder flexion; scaption; ER in scapular plane   Modalities: Icing at home  Self Care: Instructed in sleeping position use of sling for sleeping and propping/supporting UE Continue with sling for comfort and UE support (only using at night  now per pt report)  Preferred position  R UE supported at side on pillow when sitting for prolonged periods of time   [facility] Adult PT Treatment:                                                DATE: 01/13/25 Manual Therapy: (patient sitting) STM R upper trap;  pecs; periscapular musculature; biceps/anterior shoulder  PROM R shoulder flexion; scaption; ER in scapular plane   Neuromuscular re-ed: Chin tuck 5 sec x 10  Chin tuck with cervical rotation Chin tuck with lateral cervical flexion Scap squeeze 5 sec x 10; repeated with coregeous ball thoracic spine sitting  Scap squeeze in supine engaging posterior shoulder girdle musculature x 10  Supine rhythmic stabilization - circles CW/CCW; flexion/extension; horizontal ab/adduction small ranges Supine serratus punch 10 Sidelying ER available range x 10 x 2 Shoulder flexion on incline ~ 60 deg x 10 Standing shoulder flexion towel on wall x 7 to fatigue  Shoulder rolls fwd/back in sidelying   Therapeutic Activity: Shoulder flexion step back 10 sec x 5  Supine - assisting with L hand for AAROM R shoulder flexion keeping scapulae down and back (no popping or pain reported) Shoulder extension isometric at wall 5 sec x 10 x 2 Shoulder abduction isometric at wall 5 sec x 10 x 2 Shoulder flexion isometric at wall 5 sec x 10 x 2 Isometric row green TB 3 sec x 10  Isometric ER red TB 3 sec x 10  Isometric IR red TB 3 sec x 10 Sidelying shoulder abduction with elbow flexed x 10 x 2  Sidelying shoulder abduction with elbow extended x 10  Supine shoulder flexion through partial range 90 to 120 degrees   Manual: STM R upper quarter patient sidelying and supine PROM R shoulder flexion; scaption; ER in scapular plane   Modalities: Icing at home  Self Care: Instructed in sleeping position use of sling for sleeping and propping/supporting UE Continue with sling for comfort and UE support (only using at night now per pt report)  Preferred position  R UE supported at side on pillow when sitting for prolonged periods of time   PATIENT EDUCATION: Education details: POC; HEP  Person educated: Patient Education method: Explanation, Demonstration, Tactile cues, Verbal cues, and Handouts Education  comprehension: verbalized understanding, returned demonstration, verbal cues required, tactile cues required, and needs further education  HOME EXERCISE PROGRAM: Access Code: [email or web address] URL: [email or web address] Date: 01/20/2025 Prepared by: Celyn Holt  Program Notes -sitting with chest up and shoulder down and back -pretend to push arm forward, out to side, and backward without moving arm-3 seconds x 10 -2 x day   Exercises - Seated Scapular Retraction  - 2 x daily - 7 x weekly - 1-2 sets - 10 reps - 10 sec  hold - Seated Cervical Retraction  - 2 x daily - 7 x weekly - 1-2 sets - 5-10 reps - 10 sec  hold - Circular Shoulder Pendulum with Table Support  - 3-4 x daily - 7 x weekly - 1 sets - 20-30 reps - Standing 'L' Stretch at Counter  - 2 x daily - 7 x weekly - 1 sets - 5-10 reps - 5 sec  hold - Seated Shoulder External Rotation AAROM with Dowel  - 2 x daily - 7 x weekly - 1 sets -  5-10 reps - 5 sec  hold - Supine Shoulder Flexion AAROM with Hands Clasped  - 2 x daily - 7 x weekly - 1 sets - 5-10 reps - 2-3 sec  hold - Standing Forward-Bent Shoulder Extension  - 1 x daily - 7 x weekly - 1 sets - 5-10 reps - 3 sec  hold - Standing Isometric Shoulder Extension with Doorway - Arm Bent  - 1-2 x daily - 7 x weekly - 1 sets - 5-10 reps - 5 sec  hold - Isometric Shoulder Abduction at Wall  - 1-2 x daily - 7 x weekly - 1 sets - 5-10 reps - 5 sec  hold - Isometric Shoulder Flexion at Wall  - 1-2 x daily - 7 x weekly - 1 sets - 5-10 reps - 5 sec  hold - Supine Shoulder Rhythmic Stabilization- Horizontal Abduction/Adduction  - 1 x daily - 7 x weekly - 1 sets - 5-10 reps - Sidelying Shoulder External Rotation  - 2 x daily - 7 x weekly - 1 sets - 10 reps - 3 sec  hold - Sidelying Shoulder Abduction Palm Forward  - 1 x daily - 7 x weekly - 1 sets - 5-10 reps - 3 sec  hold - Standing Shoulder Row Reactive Isometric  - 2 x daily - 7 x weekly - 1 sets - 10 reps - 30-45 sec  hold - Shoulder  Flexion Wall Slide with Towel  - 2 x daily - 7 x weekly - 1 sets - 5-10 reps - 3-5 sec  hold - Shoulder External Rotation Reactive Isometrics  - 1 x daily - 7 x weekly - 1 sets - 10 reps - 3-5 sec  hold - Shoulder Internal Rotation Reactive Isometrics  - 2 x daily - 7 x weekly - 1 sets - 10 reps - 3-5 sec  hold - Standing Shoulder Flexion Reactive Isometric  - 2 x daily - 7 x weekly - 1 sets - 10 reps - 3-5 sec  hold - Seated Shoulder Flexion AAROM with Pulley Behind  - 1 x daily - 7 x weekly - 1 sets - 5-10 reps - 10 sec  hold  Patient Education - Scar Massage  ASSESSMENT:  CLINICAL IMPRESSION: Continued shoulder isometric strengthening in standing and progressed active ROM exercises in supine to tolerance. Moderate fatigue noted with partial range flexion AROM, however no pain. Tactile cues provided to improve scapula stability and awareness with upright posture; tendency towards rounded forward shoulder posture. Active assist shoulder flexion wall slides discontinued due to increased anterior shoulder pain; patient will ice at home.      He returns today with significant increased pain in the R shoulder following sleeping in adducted shoulder position. Pain has persisted throughout the day. He presents with rounded shoulder in adducted and flexed posture with rm resting across his body. Treatment address posture and alignment with gentle movement of scapula and shoulder. Using movement of cervical spine and R UE to improve tissue release R shoulder girdle. Patient tolerated treatment well with report of being pain free at end of treatment.  And encouraged patient to use pain meds on a regular basis, modify posture for sitting; keep UE supported at home.  Eval: Patient is a 69 y.o. male who was seen today for physical therapy evaluation and treatment s/p R TSA 11/24/24 following a fall with injury 07/27/24. He has been out of the sling since ~ the second post op day per MD instructions. Discussed  supporting R UE when out of the sling to allow musculature of R shoulder girdle to rest and heal without need to support the weight of the UE. Patient presents with rounded posture and alignent through the R shoulder girdle and thoracic spine; limited ROM, strength, function of R UE following reverse TSA. Of concern, he continues to report pain at 7-8/10 on a daily basis. Patient has decreased functional activities and ADL's. Levin will benefit from PT to address deficits and progress with shoulder rehab.   OBJECTIVE IMPAIRMENTS: decreased activity tolerance, decreased mobility, decreased ROM, decreased strength, increased muscle spasms, impaired UE functional use, improper body mechanics, postural dysfunction, and pain.   GOALS: Goals reviewed with patient? Yes  SHORT TERM GOALS: Target date: 01/11/2025  Independent in initial HEP  Baseline: Goal status: met  2.  Improve posture and alignment through shoulder girdle with patient demonstrating activation of posterior shoulder girdle musculature to improve shoulder function  Baseline:  Goal status: met 3.  P/AAROM R shoulder to 130 deg elevation and 30 deg ER in scapular plane Baseline:  Goal status: INITIAL  LONG TERM GOALS: Target date: 02/22/2025  Progress shoulder rehab per protocol or as directed by MD  Baseline:  Goal status: on gong   2.  PROM 145 deg elevation and 40 deg ER in scapular plane; functional IR to L! Baseline:  Goal status: on going  3.  Decrease pain to 0/10 to no more than 3/10  Baseline:  Goal status: partially met  4.  Return to functional activities using R UE including return to work at desk/computer/phone  Baseline:  Goal status: on going  5.  Improve Quick DASH score by 10% indicating improved functional activities  Baseline:  Goal status: on going  6.  Independent in advanced HEP  Baseline:  Goal status: on going PLAN:  PT FREQUENCY: 2x/week  PT DURATION: 12 weeks  PLANNED INTERVENTIONS:  97164- PT Re-evaluation, 97110-Therapeutic exercises, 97530- Therapeutic activity, 97112- Neuromuscular re-education, 97535- Self Care, [id number]- Manual therapy, [phone]- Aquatic Therapy, Patient/Family education, and Taping  PLAN FOR NEXT SESSION: review and progress exercise per protocol; manual work and modalities as indicated; progress rehab per protocol and/or as ordered;  continued with exercises per protocol    Leon Nelson, PTA 01/22/2025, 9:23 AM  "

## 2025-01-25 ENCOUNTER — Ambulatory Visit: Admitting: Rehabilitative and Restorative Service Providers"

## 2025-01-25 ENCOUNTER — Other Ambulatory Visit (HOSPITAL_COMMUNITY): Payer: Self-pay

## 2025-01-25 ENCOUNTER — Encounter (HOSPITAL_BASED_OUTPATIENT_CLINIC_OR_DEPARTMENT_OTHER): Payer: Self-pay

## 2025-01-25 ENCOUNTER — Telehealth: Payer: Self-pay | Admitting: Pharmacy Technician

## 2025-01-25 NOTE — Telephone Encounter (Signed)
 Pharmacy Patient Advocate Encounter   Received notification from [facility] that prior authorization for Solifenacin  Succinate 5MG  tablets is required/requested.   Insurance verification completed.   The patient is insured through [place] [employer].   Per test claim: The current 90 day co-pay is, $19.83.  No PA needed at this time. This test claim was processed through [facility]- copay amounts may vary at other pharmacies due to pharmacy/plan contracts, or as the patient moves through the different stages of their insurance plan.

## 2025-01-27 ENCOUNTER — Encounter: Payer: Self-pay | Admitting: Rehabilitative and Restorative Service Providers"

## 2025-01-27 ENCOUNTER — Ambulatory Visit: Admitting: Rehabilitative and Restorative Service Providers"

## 2025-01-27 DIAGNOSIS — M25511 Pain in right shoulder: Secondary | ICD-10-CM

## 2025-01-27 DIAGNOSIS — R29898 Other symptoms and signs involving the musculoskeletal system: Secondary | ICD-10-CM

## 2025-01-27 DIAGNOSIS — M6281 Muscle weakness (generalized): Secondary | ICD-10-CM

## 2025-01-27 NOTE — Therapy (Signed)
 " OUTPATIENT PHYSICAL THERAPY SHOULDER TREATMENT   Patient Name: Leon Nelson MRN: [id number] DOB:July 05, 1956, 69 y.o., male Today's Date: 01/27/2025  END OF SESSION:  PT End of Session - 01/27/25 0806     Visit Number 14    Number of Visits 24    Date for Recertification  02/22/25    Authorization Type [employer] medicare auth required $25 copay    Authorization Time Period 12/29/24-03/29/25    Authorization - Visit Number 14    Authorization - Number of Visits 16    Progress Note Due on Visit 20    PT Start Time 0800    PT Stop Time 0845    PT Time Calculation (min) 45 min    Activity Tolerance Patient tolerated treatment well         Past Medical History:  Diagnosis Date   ABDOMINAL PAIN OTHER SPECIFIED SITE 07/20/2008   Arthritis Currently   CHEST PAIN 02/06/2011   COLONIC POLYPS, HX OF 05/26/2008   DEPRESSION 05/26/2008   DIABETES MELLITUS, TYPE II 05/26/2008   HYPERLIPIDEMIA 05/26/2008   HYPERTENSION 05/26/2008   HYPOTHYROIDISM 05/26/2008   KNEE PAIN, RIGHT, ACUTE 07/01/2008   Personal history of urinary calculi 05/26/2008   Pure hypercholesterolemia 05/19/2024   UTI'S, HX OF 05/26/2008   Past Surgical History:  Procedure Laterality Date   ANKLE SURGERY Left    JOINT REPLACEMENT  09/2009   Right Knee   REVERSE SHOULDER ARTHROPLASTY Right 11/24/2024   Procedure: ARTHROPLASTY, SHOULDER, TOTAL, REVERSE;  Surgeon: Genelle Standing, MD;  Location: [facility];  Service: Orthopedics;  Laterality: Right;   s/p right knee arthroplasty      complicated by patellar tendon rupture Jan 2011 Dr. heide   TONSILLECTOMY     Patient Active Problem List   Diagnosis Date Noted   Chronic gout without tophus 12/25/2024   Hypothyroidism 12/25/2024   Traumatic complete tear of right rotator cuff 11/24/2024   Fatty liver 07/29/2024   Joint pain 05/19/2024   Low back pain 05/19/2024   Thrombosed external hemorrhoids 05/19/2024   Work stress 04/09/2024   Coronary artery  disease due to lipid rich plaque 04/09/2024   DOE (dyspnea on exertion) 04/07/2024   Ascending aortic aneurysm 03/17/2024   Neuropathy 02/02/2024   Changes in vision 09/14/2023   Urinary frequency 01/23/2023   Constipation 01/23/2023   RUQ pain 04/24/2021   Vitamin D  deficiency 04/24/2021   Vitamin B deficiency 04/24/2021   Balance disorder 06/22/2020   Dizziness 06/22/2020   Irritable bowel syndrome, unspecified 01/15/2019   Rhabdomyolysis 01/08/2019   Left cervical radiculopathy 01/27/2018   Left hand paresthesia 01/27/2018   Hypokalemia 03/27/2017   Heart murmur previously undiagnosed 08/14/2011   Diabetes mellitus type 2 with neurological manifestations (HCC) 05/26/2008   Essential hypertension 05/26/2008   History of colonic polyps 05/26/2008   Depression, major, single episode, complete remission 05/26/2008   PCP: Dr [name] REFERRING PROVIDER: Dr [name] REFERRING DIAG: R Reverse TSA THERAPY DIAG:  Acute pain of right shoulder  Other symptoms and signs involving the musculoskeletal system  Muscle weakness (generalized)  Rationale for Evaluation and Treatment: Rehabilitation  ONSET DATE: 11/24/24  SUBJECTIVE:  SUBJECTIVE STATEMENT: Patient reports some soreness pain. Has had no pain over the weekend. His wife notices that he is using the R arm more for activities at home. He has also noticed this since his wife mentioned it. Verbalizes more awareness of position of scapula with treatment.   RTW: 01/25/25 - at desk and computer, works from home  EVAL: Patient reports that he had a total tear of the rotator cuff in his R shoulder. Patient reports that he fell 07/27/24, landing body weight on the R shoulder. He had pain and was unable to lift R UE or use arm. Diagnostic tests revealed  torn rotator cuff. Patient underwent reverse total shoulder replacement 11/24/24. Post op course has been uncomplicated. He has continued pain at 7-8/10 level. He is sleeping in the recliner. Pain free R shoulder at conclusion of treatment. Hand dominance: Right  PERTINENT HISTORY: Type II diabetes; hyperlipidemia; HTN; hypothyroidism: R knee pain; abdominal pain; depression; R TKA; ankle stabilization surgery 2020  PAIN:  Are you having pain? Yes: NPRS scale: 0-1/10 soreness Pain location: R shoulder Pain description: shooting; sometimes sharp  Aggravating factors: movement  Relieving factors: advil ; tylonol;   PRECAUTIONS: Shoulder R reverse TSA per protocol   WEIGHT BEARING RESTRICTIONS: Yes   FALLS:  Has patient fallen in last 6 months? Yes. 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  DIAGNOSTIC FINDINGS:  CT R shoulder 09/30/24: 1. Moderate glenohumeral joint osteoarthritis. 2. Severe acromioclavicular joint osteoarthritis. 3. Mild atrophy of the subscapularis and infraspinatus muscles.  PATIENT SURVEYS: (survey based on patient's subjective report of symptoms and activity level)  Quick DASH - 88.6/100; 88.6%      SENSATION: WFL  POSTURE: Head forward; shoulders rounded and elevated; increased thoracic kyphosis  UPPER EXTREMITY ROM: R hand, wrist, forearm, elbow WFL's    R shoulder ROM not assessed today    L UE WFL's     12/14/24: PROM R shoulder previously assessed in sitting  Passive ROM  Right 12/02/24 Right  12/08/24  Right  12/14/24 Supine  Right  01/04/25 Supine  Right  01/06/25 Supine   Shoulder flexion 75 deg 95 141 126 136  Shoulder extension       Shoulder abduction (scaption) 40 80 80 90 90  Shoulder adduction       Shoulder internal rotation       Shoulder external rotation  (in scapular plane)  20  30 32 44  46  Elbow flexion       Elbow extension       Wrist flexion       Wrist extension       Wrist ulnar deviation       Wrist radial deviation       Wrist pronation       Wrist supination       (Blank rows = not tested)  UPPER EXTREMITY MMT: not assessed  MMT Right eval Left eval  Shoulder flexion    Shoulder extension    Shoulder abduction  Shoulder adduction    Shoulder internal rotation    Shoulder external rotation    Middle trapezius    Lower trapezius    Elbow flexion    Elbow extension    Wrist flexion    Wrist extension    Wrist ulnar deviation    Wrist radial deviation    Wrist pronation    Wrist supination    Grip strength (lbs)    (Blank rows = not tested)  PALPATION:   Muscular tightness upper trap     [facility] Adult PT Treatment:                                                DATE: 01/27/2025 Therapeutic Exercise: Standing L stretch at counter Shoulder bkwd circles  Neuromuscular re-ed: Sitting  Work on active shoulder flexion and abduction in small ranges with focus on scapular stability. Use of mirror initially to improve awareness to decrease shoulder elevation  Scap squeeze  Supine: Supine serratus punch x 10 Supine rhythmic stabilization - circles CW/CCW; flexion/extension; horizontal ab/adduction small ranges Side Lying: Shoulder abduction with elbow flexed 2x10  Shoulder abduction with elbow extended x 10 partial range  ER towel under arm 2x10 Isometric row green TB 10 x 3 sec Isometric ER red TB 10 x 3 sec Isometric IR green TB 10 x 3 sec Isometric flexion step out green TB 10 x 3 sec Isotonic row  green TB 3 sec x 10 x 2  Therapeutic Activity: Shoulder isometrics --> flexion, abduction, extension standing 10 x 5 sec each Supine cervical retraction Supine AAROM shoulder flexion with hands clasped x10 Shoulder flexion AROM partial range x 6 Standing shoulder flexion towel on wall --> discontinued d/t anterior shoulder pain   [facility] Adult PT Treatment:                                                DATE: 01/22/2025 Therapeutic Exercise: Standing L stretch at counter Pulleys shoulder flexion x 2 min Shoulder bkwd circles Pendulums Neuromuscular re-ed: Supine: Supine serratus punch x 10 Supine rhythmic stabilization - circles CW/CCW; flexion/extension; horizontal ab/adduction small ranges Side Lying: Shoulder abduction with elbow flexed 2x10  Shoulder abduction with elbow extended x 12 towel under arm partial range  ER towel under arm 2x10 Isometric row green TB 10 x 3 sec Isometric ER red TB 10 x 3 sec Isometric IR green TB 10 x 3 sec Isometric flexion step out red TB 10 x 3 sec Therapeutic Activity: Shoulder isometrics --> flexion, abduction, extension 10 x 5 sec each Supine cervical retraction Supine AAROM shoulder flexion with hands clasped x10 Shoulder flexion AROM partial range x 6 Standing shoulder flexion towel on wall --> discontinued d/t anterior shoulder pain    [facility] Adult PT Treatment:                                                DATE: 01/20/25  Neuromuscular re-ed: Chin tuck 5 sec x 10  Chin tuck with cervical rotation Chin tuck with lateral cervical flexion Scap squeeze 5 sec x 10; repeated  with coregeous ball thoracic spine sitting  Scap squeeze in supine engaging posterior shoulder girdle musculature x 10  Supine rhythmic stabilization - circles CW/CCW; flexion/extension; horizontal ab/adduction small ranges Supine serratus punch 10 Sidelying ER available range x 10 x 2 Standing shoulder flexion towel on wall x 7 to fatigue  Shoulder rolls fwd/back in  sidelying   Therapeutic Activity:  Supine - assisting with L hand for AAROM R shoulder flexion keeping scapulae down and back (no popping or pain reported) Shoulder extension isometric at wall 5 sec x 10 x 2 Shoulder abduction isometric at wall 5 sec x 10 x 2 Shoulder flexion isometric at wall 5 sec x 10 x 2 Isometric row red TB 3 sec x 10  Isometric ER red TB 3 sec x 10  Isometric IR red TB 3 sec x 10 isometric flexion step out 10 sec x 5 red TB Sidelying shoulder abduction with elbow flexed x 10 x 2  Sidelying shoulder abduction with elbow extended x 10 towel under arm partial range  Sidelying ER towel under arm x 10 x 2  Supine shoulder flexion through partial range 90 to 120 degrees   Pulley flexion 10 sec x 5 reps  Manual: STM R upper quarter patient supine PROM R shoulder flexion; scaption; ER in scapular plane   Modalities: Icing at home  Self Care: Instructed in sleeping position use of sling for sleeping and propping/supporting UE Continue with sling for comfort and UE support (only using at night now per pt report)  Preferred position  R UE supported at side on pillow when sitting for prolonged periods of time   [facility] Adult PT Treatment:                                                DATE: 01/13/25 Manual Therapy: (patient sitting) STM R upper trap; pecs; periscapular musculature; biceps/anterior shoulder  PROM R shoulder flexion; scaption; ER in scapular plane   Neuromuscular re-ed: Chin tuck 5 sec x 10  Chin tuck with cervical rotation Chin tuck with lateral cervical flexion Scap squeeze 5 sec x 10; repeated with coregeous ball thoracic spine sitting  Scap squeeze in supine engaging posterior shoulder girdle musculature x 10  Supine rhythmic stabilization - circles CW/CCW; flexion/extension; horizontal ab/adduction small ranges Supine serratus punch 10 Sidelying ER available range x 10 x 2 Shoulder flexion on incline ~ 60 deg x 10 Standing shoulder flexion towel  on wall x 7 to fatigue  Shoulder rolls fwd/back in sidelying   Therapeutic Activity: Shoulder flexion step back 10 sec x 5  Supine - assisting with L hand for AAROM R shoulder flexion keeping scapulae down and back (no popping or pain reported) Shoulder extension isometric at wall 5 sec x 10 x 2 Shoulder abduction isometric at wall 5 sec x 10 x 2 Shoulder flexion isometric at wall 5 sec x 10 x 2 Isometric row green TB 3 sec x 10  Isometric ER red TB 3 sec x 10  Isometric IR red TB 3 sec x 10 Sidelying shoulder abduction with elbow flexed x 10 x 2  Sidelying shoulder abduction with elbow extended x 10  Supine shoulder flexion through partial range 90 to 120 degrees   Manual: STM R upper quarter patient sidelying and supine PROM R shoulder flexion; scaption; ER in scapular plane  Modalities: Icing at home  Self Care: Instructed in sleeping position use of sling for sleeping and propping/supporting UE Continue with sling for comfort and UE support (only using at night now per pt report)  Preferred position  R UE supported at side on pillow when sitting for prolonged periods of time   PATIENT EDUCATION: Education details: POC; HEP  Person educated: Patient Education method: Explanation, Demonstration, Tactile cues, Verbal cues, and Handouts Education comprehension: verbalized understanding, returned demonstration, verbal cues required, tactile cues required, and needs further education  HOME EXERCISE PROGRAM: Access Code: [email or web address] URL: [email or web address] Date: 01/27/2025 Prepared by: Qadir Folks  Program Notes -sitting with chest up and shoulder down and back -pretend to push arm forward, out to side, and backward without moving arm-3 seconds x 10 -2 x day   Exercises - Seated Scapular Retraction  - 2 x daily - 7 x weekly - 1-2 sets - 10 reps - 10 sec  hold - Seated Cervical Retraction  - 2 x daily - 7 x weekly - 1-2 sets - 5-10 reps - 10 sec  hold - Circular  Shoulder Pendulum with Table Support  - 3-4 x daily - 7 x weekly - 1 sets - 20-30 reps - Standing 'L' Stretch at Counter  - 2 x daily - 7 x weekly - 1 sets - 5-10 reps - 5 sec  hold - Seated Shoulder External Rotation AAROM with Dowel  - 2 x daily - 7 x weekly - 1 sets - 5-10 reps - 5 sec  hold - Supine Shoulder Flexion AAROM with Hands Clasped  - 2 x daily - 7 x weekly - 1 sets - 5-10 reps - 2-3 sec  hold - Standing Forward-Bent Shoulder Extension  - 1 x daily - 7 x weekly - 1 sets - 5-10 reps - 3 sec  hold - Standing Isometric Shoulder Extension with Doorway - Arm Bent  - 1-2 x daily - 7 x weekly - 1 sets - 5-10 reps - 5 sec  hold - Isometric Shoulder Abduction at Wall  - 1-2 x daily - 7 x weekly - 1 sets - 5-10 reps - 5 sec  hold - Isometric Shoulder Flexion at Wall  - 1-2 x daily - 7 x weekly - 1 sets - 5-10 reps - 5 sec  hold - Supine Shoulder Rhythmic Stabilization- Horizontal Abduction/Adduction  - 1 x daily - 7 x weekly - 1 sets - 5-10 reps - Sidelying Shoulder External Rotation  - 2 x daily - 7 x weekly - 1 sets - 10 reps - 3 sec  hold - Sidelying Shoulder Abduction Palm Forward  - 1 x daily - 7 x weekly - 1 sets - 5-10 reps - 3 sec  hold - Standing Shoulder Row Reactive Isometric  - 2 x daily - 7 x weekly - 1 sets - 10 reps - 30-45 sec  hold - Shoulder Flexion Wall Slide with Towel  - 2 x daily - 7 x weekly - 1 sets - 5-10 reps - 3-5 sec  hold - Shoulder External Rotation Reactive Isometrics  - 1 x daily - 7 x weekly - 1 sets - 10 reps - 3-5 sec  hold - Shoulder Internal Rotation Reactive Isometrics  - 2 x daily - 7 x weekly - 1 sets - 10 reps - 3-5 sec  hold - Standing Shoulder Flexion Reactive Isometric  - 2 x daily - 7 x weekly - 1 sets - 10 reps -  3-5 sec  hold - Seated Shoulder Flexion AAROM with Pulley Behind  - 1 x daily - 7 x weekly - 1 sets - 5-10 reps - 10 sec  hold - Standing Bilateral Low Shoulder Row with Anchored Resistance  - 2 x daily - 7 x weekly - 1-3 sets - 10 reps - 2-3  sec  hold  Patient Education - Scar Massage  ASSESSMENT:  CLINICAL IMPRESSION: Patient reports continued improvement in shoulder function with minimal soreness. Continued work on scapular stabilization with shoulder elevation. Use of mirror to provide visual cues for scapular stability. Use of mirror, verbal cues for work related tasks such as use of keyboard. Working on isometric strengthening in standing and progressed active ROM exercises in supine to tolerance. Noted fatigue with reported soreness with neuromuscular re-education activities. Added isotonic row with TB without difficulty. Will benefit from continued improvement in scapula stability and awareness to improve upright posture and alignment, decreasing forward shoulder posture. Patient will focus on small range AROM R shoulder flexion and abduction maintaining improved scapular control.      He returns today with significant increased pain in the R shoulder following sleeping in adducted shoulder position. Pain has persisted throughout the day. He presents with rounded shoulder in adducted and flexed posture with rm resting across his body. Treatment address posture and alignment with gentle movement of scapula and shoulder. Using movement of cervical spine and R UE to improve tissue release R shoulder girdle. Patient tolerated treatment well with report of being pain free at end of treatment.  And encouraged patient to use pain meds on a regular basis, modify posture for sitting; keep UE supported at home.  Eval: Patient is a 69 y.o. male who was seen today for physical therapy evaluation and treatment s/p R TSA 11/24/24 following a fall with injury 07/27/24. He has been out of the sling since ~ the second post op day per MD instructions. Discussed supporting R UE when out of the sling to allow musculature of R shoulder girdle to rest and heal without need to support the weight of the UE. Patient presents with rounded posture and alignent  through the R shoulder girdle and thoracic spine; limited ROM, strength, function of R UE following reverse TSA. Of concern, he continues to report pain at 7-8/10 on a daily basis. Patient has decreased functional activities and ADL's. Jory will benefit from PT to address deficits and progress with shoulder rehab.   OBJECTIVE IMPAIRMENTS: decreased activity tolerance, decreased mobility, decreased ROM, decreased strength, increased muscle spasms, impaired UE functional use, improper body mechanics, postural dysfunction, and pain.   GOALS: Goals reviewed with patient? Yes  SHORT TERM GOALS: Target date: 01/11/2025  Independent in initial HEP  Baseline: Goal status: met  2.  Improve posture and alignment through shoulder girdle with patient demonstrating activation of posterior shoulder girdle musculature to improve shoulder function  Baseline:  Goal status: met 3.  P/AAROM R shoulder to 130 deg elevation and 30 deg ER in scapular plane Baseline:  Goal status: INITIAL  LONG TERM GOALS: Target date: 02/22/2025  Progress shoulder rehab per protocol or as directed by MD  Baseline:  Goal status: on gong   2.  PROM 145 deg elevation and 40 deg ER in scapular plane; functional IR to L! Baseline:  Goal status: on going  3.  Decrease pain to 0/10 to no more than 3/10  Baseline:  Goal status: partially met  4.  Return to functional activities using  R UE including return to work at desk/computer/phone  Baseline:  Goal status: on going  5.  Improve Quick DASH score by 10% indicating improved functional activities  Baseline:  Goal status: on going  6.  Independent in advanced HEP  Baseline:  Goal status: on going PLAN:  PT FREQUENCY: 2x/week  PT DURATION: 12 weeks  PLANNED INTERVENTIONS: 97164- PT Re-evaluation, 97110-Therapeutic exercises, 97530- Therapeutic activity, 97112- Neuromuscular re-education, 97535- Self Care, [id number]- Manual therapy, [phone]- Aquatic Therapy,  Patient/Family education, and Taping  PLAN FOR NEXT SESSION: progress strengthening exercise per protocol; continue work on press photographer; manual work and modalities as indicated; progress rehab per protocol and/or as ordered   Deyna Carbon P Festus Pursel, PT 01/27/2025, 8:08 AM  "

## 2025-01-29 ENCOUNTER — Other Ambulatory Visit: Payer: Self-pay

## 2025-01-29 ENCOUNTER — Ambulatory Visit: Admitting: Internal Medicine

## 2025-01-29 ENCOUNTER — Ambulatory Visit

## 2025-01-29 ENCOUNTER — Telehealth: Payer: Self-pay | Admitting: Neurology

## 2025-01-29 MED ORDER — SOLIFENACIN SUCCINATE 5 MG PO TABS: 5.0000 mg | ORAL_TABLET | Freq: Every day | ORAL | 0 refills | Status: AC

## 2025-01-29 NOTE — Telephone Encounter (Signed)
 Sent to mail order pharmacy last month for 90 day supply, patient wanting local. RX sent.   Copied from CRM #[id number]. Topic: Clinical - Medication Refill >> Jan 29, 2025  9:17 AM Tiffini S wrote: Medication:  solifenacin  ([facility]ESICARE ) 5 MG tablet for 90 day supply   Has the patient contacted their pharmacy? Yes (Agent: If no, request that the patient contact the pharmacy for the refill. If patient does not wish to contact the pharmacy document the reason why and proceed with request.) (Agent: If yes, when and what did the pharmacy advise?)  This is the patient's preferred pharmacy:   [facility] [address] [place] - [address] [place] [address] Phone: [phone] Fax: [phone]   Is this the correct pharmacy for this prescription? Yes If no, delete pharmacy and type the correct one.   Has the prescription been filled recently? Yes  Is the patient out of the medication? Yes, three days left   Has the patient been seen for an appointment in the last year OR does the patient have an upcoming appointment? Yes  Can we respond through MyChart? Yes  Agent: Please be advised that Rx refills may take up to 3 business days. We ask that you follow-up with your pharmacy.

## 2025-02-01 ENCOUNTER — Ambulatory Visit: Admitting: Rehabilitative and Restorative Service Providers"

## 2025-02-03 ENCOUNTER — Ambulatory Visit: Admitting: Rehabilitative and Restorative Service Providers"

## 2025-02-09 ENCOUNTER — Ambulatory Visit

## 2025-02-11 ENCOUNTER — Ambulatory Visit: Admitting: Rehabilitative and Restorative Service Providers"

## 2025-02-16 ENCOUNTER — Ambulatory Visit: Admitting: Rehabilitative and Restorative Service Providers"

## 2025-02-18 ENCOUNTER — Ambulatory Visit: Admitting: Rehabilitative and Restorative Service Providers"

## 2025-02-19 ENCOUNTER — Encounter (HOSPITAL_BASED_OUTPATIENT_CLINIC_OR_DEPARTMENT_OTHER): Admitting: Orthopaedic Surgery

## 2025-03-16 ENCOUNTER — Ambulatory Visit: Admitting: Podiatry

## 2025-03-25 ENCOUNTER — Ambulatory Visit: Admitting: Student

## 2025-06-02 ENCOUNTER — Ambulatory Visit

## 2025-08-24 ENCOUNTER — Ambulatory Visit (HOSPITAL_BASED_OUTPATIENT_CLINIC_OR_DEPARTMENT_OTHER)
